# Patient Record
Sex: Male | Born: 1975 | Race: Black or African American | Hispanic: No | Marital: Married | Smoking: Former smoker
Health system: Southern US, Community
[De-identification: ages and names within clinical notes are randomized; demographics above are authoritative.]

## PROBLEM LIST (undated history)

## (undated) DIAGNOSIS — I82409 Acute embolism and thrombosis of unspecified deep veins of unspecified lower extremity: Secondary | ICD-10-CM

## (undated) DIAGNOSIS — G4733 Obstructive sleep apnea (adult) (pediatric): Secondary | ICD-10-CM

## (undated) DIAGNOSIS — I5030 Unspecified diastolic (congestive) heart failure: Secondary | ICD-10-CM

## (undated) DIAGNOSIS — E119 Type 2 diabetes mellitus without complications: Secondary | ICD-10-CM

## (undated) DIAGNOSIS — J449 Chronic obstructive pulmonary disease, unspecified: Secondary | ICD-10-CM

## (undated) DIAGNOSIS — M109 Gout, unspecified: Secondary | ICD-10-CM

## (undated) DIAGNOSIS — I10 Essential (primary) hypertension: Secondary | ICD-10-CM

## (undated) DIAGNOSIS — I509 Heart failure, unspecified: Secondary | ICD-10-CM

## (undated) HISTORY — PX: OTHER SURGICAL HISTORY: SHX169

---

## 2005-05-15 ENCOUNTER — Emergency Department: Payer: Self-pay | Admitting: Emergency Medicine

## 2005-09-20 ENCOUNTER — Emergency Department: Payer: Self-pay | Admitting: Emergency Medicine

## 2005-09-20 ENCOUNTER — Other Ambulatory Visit: Payer: Self-pay

## 2006-09-04 ENCOUNTER — Inpatient Hospital Stay: Payer: Self-pay | Admitting: Internal Medicine

## 2006-09-04 ENCOUNTER — Other Ambulatory Visit: Payer: Self-pay

## 2006-09-05 ENCOUNTER — Other Ambulatory Visit: Payer: Self-pay

## 2006-09-06 ENCOUNTER — Other Ambulatory Visit: Payer: Self-pay

## 2008-08-23 ENCOUNTER — Emergency Department: Payer: Self-pay | Admitting: Emergency Medicine

## 2009-07-21 ENCOUNTER — Emergency Department: Payer: Self-pay | Admitting: Unknown Physician Specialty

## 2009-12-12 ENCOUNTER — Inpatient Hospital Stay: Payer: Self-pay | Admitting: Internal Medicine

## 2010-04-26 ENCOUNTER — Emergency Department: Payer: Self-pay

## 2011-03-04 ENCOUNTER — Inpatient Hospital Stay: Payer: Self-pay | Admitting: Internal Medicine

## 2011-11-13 ENCOUNTER — Inpatient Hospital Stay: Payer: Self-pay | Admitting: Internal Medicine

## 2011-11-13 LAB — BASIC METABOLIC PANEL
Anion Gap: 9 (ref 7–16)
Co2: 30 mmol/L (ref 21–32)
Creatinine: 0.72 mg/dL (ref 0.60–1.30)
EGFR (African American): >60
EGFR (Non-African Amer.): >60
Osmolality: 280 (ref 275–301)

## 2011-11-13 LAB — CBC
HGB: 14.6 g/dL (ref 13.0–18.0)
MCH: 26.7 pg (ref 26.0–34.0)
MCV: 82 fL (ref 80–100)
Platelet: 173 10*3/uL (ref 150–440)
RBC: 5.44 10*6/uL (ref 4.40–5.90)
RDW: 16.9 % — ABNORMAL HIGH (ref 11.5–14.5)
WBC: 6.3 10*3/uL (ref 3.8–10.6)

## 2011-11-14 LAB — CBC WITH DIFFERENTIAL/PLATELET
Basophil #: 0.1 10*3/uL (ref 0.0–0.1)
Eosinophil %: 0 %
HCT: 42.2 % (ref 40.0–52.0)
Lymphocyte %: 17.5 %
Monocyte %: 7.7 %
Neutrophil #: 4.3 10*3/uL (ref 1.4–6.5)
RDW: 17.3 % — ABNORMAL HIGH (ref 11.5–14.5)

## 2011-11-19 LAB — CULTURE, BLOOD (SINGLE)

## 2012-08-02 ENCOUNTER — Emergency Department: Payer: Self-pay | Admitting: Unknown Physician Specialty

## 2012-08-02 LAB — CBC
HCT: 46.1 % (ref 40.0–52.0)
HGB: 15.6 g/dL (ref 13.0–18.0)
MCH: 28 pg (ref 26.0–34.0)
MCHC: 33.9 g/dL (ref 32.0–36.0)
MCV: 83 fL (ref 80–100)
RDW: 18.3 % — ABNORMAL HIGH (ref 11.5–14.5)
WBC: 6.4 10*3/uL (ref 3.8–10.6)

## 2012-08-02 LAB — CK TOTAL AND CKMB (NOT AT ARMC)
CK, Total: 195 U/L (ref 35–232)
CK, Total: 168 U/L (ref 35–232)
CK-MB: 1.3 ng/mL (ref 0.5–3.6)

## 2012-08-02 LAB — COMPREHENSIVE METABOLIC PANEL
Albumin: 3.8 g/dL (ref 3.4–5.0)
Anion Gap: 9 (ref 7–16)
BUN: 14 mg/dL (ref 7–18)
Chloride: 102 mmol/L (ref 98–107)
Creatinine: 0.8 mg/dL (ref 0.60–1.30)
EGFR (African American): >60
Glucose: 123 mg/dL — ABNORMAL HIGH (ref 65–99)
Osmolality: 279 (ref 275–301)
Potassium: 3.9 mmol/L (ref 3.5–5.1)
Sodium: 139 mmol/L (ref 136–145)

## 2012-08-02 LAB — TROPONIN I
Troponin-I: <0.02 ng/mL
Troponin-I: <0.02 ng/mL

## 2012-08-02 LAB — PRO B NATRIURETIC PEPTIDE: B-Type Natriuretic Peptide: 25 pg/mL (ref 0–125)

## 2012-10-04 ENCOUNTER — Emergency Department: Payer: Self-pay | Admitting: Emergency Medicine

## 2012-10-04 LAB — COMPREHENSIVE METABOLIC PANEL
Albumin: 3.6 g/dL (ref 3.4–5.0)
Calcium, Total: 9.6 mg/dL (ref 8.5–10.1)
Chloride: 104 mmol/L (ref 98–107)
EGFR (Non-African Amer.): >60
Glucose: 168 mg/dL — ABNORMAL HIGH (ref 65–99)
Potassium: 3.9 mmol/L (ref 3.5–5.1)
SGOT(AST): 27 U/L (ref 15–37)
SGPT (ALT): 27 U/L (ref 12–78)
Total Protein: 7.5 g/dL (ref 6.4–8.2)

## 2012-10-04 LAB — CBC
HCT: 44.9 % (ref 40.0–52.0)
MCHC: 33.1 g/dL (ref 32.0–36.0)
RDW: 17.2 % — ABNORMAL HIGH (ref 11.5–14.5)
WBC: 6 10*3/uL (ref 3.8–10.6)

## 2012-10-04 LAB — TROPONIN I: Troponin-I: <0.02 ng/mL

## 2012-11-06 LAB — URINALYSIS, COMPLETE
Bilirubin,UR: NEGATIVE
Blood: NEGATIVE
Glucose,UR: NEGATIVE mg/dL (ref 0–75)
Ph: 6 (ref 4.5–8.0)
RBC,UR: 1 /HPF (ref 0–5)
Specific Gravity: 1.016 (ref 1.003–1.030)
WBC UR: 4 /HPF (ref 0–5)

## 2012-11-06 LAB — COMPREHENSIVE METABOLIC PANEL
Albumin: 4 g/dL (ref 3.4–5.0)
Bilirubin,Total: 0.5 mg/dL (ref 0.2–1.0)
Calcium, Total: 9.7 mg/dL (ref 8.5–10.1)
Chloride: 101 mmol/L (ref 98–107)
Co2: 31 mmol/L (ref 21–32)
Creatinine: 1.02 mg/dL (ref 0.60–1.30)
EGFR (African American): >60
EGFR (Non-African Amer.): >60
Glucose: 117 mg/dL — ABNORMAL HIGH (ref 65–99)
Osmolality: 276 (ref 275–301)
SGOT(AST): 28 U/L (ref 15–37)
Sodium: 138 mmol/L (ref 136–145)
Total Protein: 8.3 g/dL — ABNORMAL HIGH (ref 6.4–8.2)

## 2012-11-06 LAB — TROPONIN I: Troponin-I: <0.02 ng/mL

## 2012-11-06 LAB — CBC
HCT: 47.2 % (ref 40.0–52.0)
MCHC: 33.4 g/dL (ref 32.0–36.0)
MCV: 83 fL (ref 80–100)
Platelet: 211 10*3/uL (ref 150–440)
RBC: 5.7 10*6/uL (ref 4.40–5.90)
RDW: 17.6 % — ABNORMAL HIGH (ref 11.5–14.5)

## 2012-11-07 ENCOUNTER — Inpatient Hospital Stay: Payer: Self-pay | Admitting: Internal Medicine

## 2012-11-07 LAB — HEMOGLOBIN A1C: Hemoglobin A1C: 5.5 % (ref 4.2–6.3)

## 2013-05-07 IMAGING — CT CT CHEST W/ CM
2 series · 15 of 31 positions shown, 19 images · IV contrast (APPLIED)
Comparison: none

REASON FOR EXAM: fever, cough, left sided chest pain w/ severely abnormal
cxr chronically.
COMMENTS:

PROCEDURE:     CT  - CT CHEST (FOR PE) W  - November 07, 2012  [DATE]
RESULT:     Comparison: 11/13/2011
TECHNIQUE: Multiple thin section axial images were obtained from the lung
apices to the upper abdomen following 100 ml Isovue 370 intravenous
contrast, according to the PE protocol. These images were also reviewed on a
[employer] multiplanar work station.

[Series 4: soft tissue · axial · 0.79mm/px · z∈[-304,-262]mm · 2 of 95 slices shown]
[im 8/95  mediastinal]
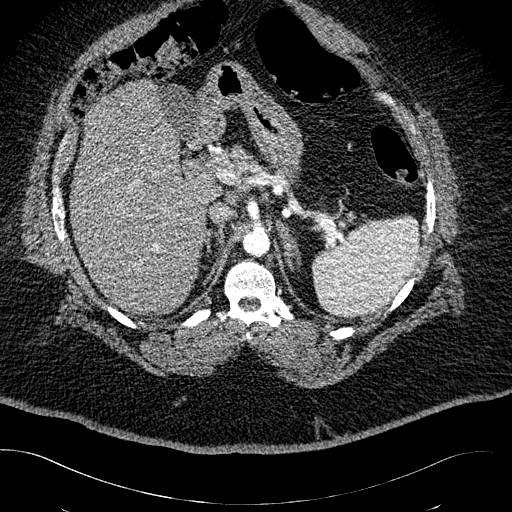
[im 22/95  mediastinal]
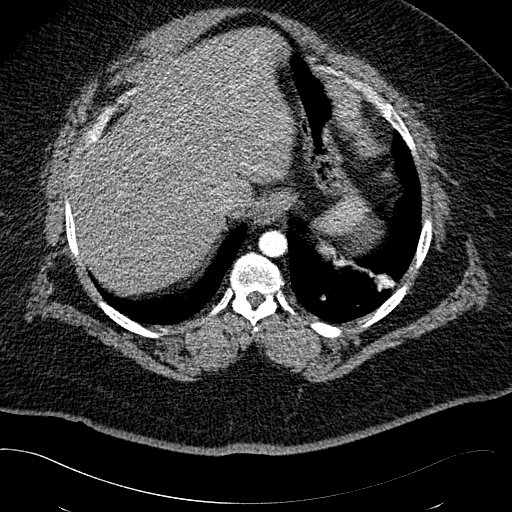

[Series 5: lung windows · axial · 0.79mm/px · z∈[-298,-68]mm · 13 of 93 slices shown, 17 images]
[im 8/93  mediastinal]
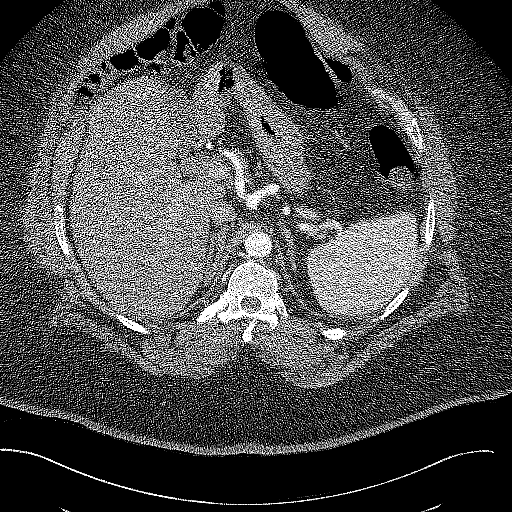
[im 8/93  lung]
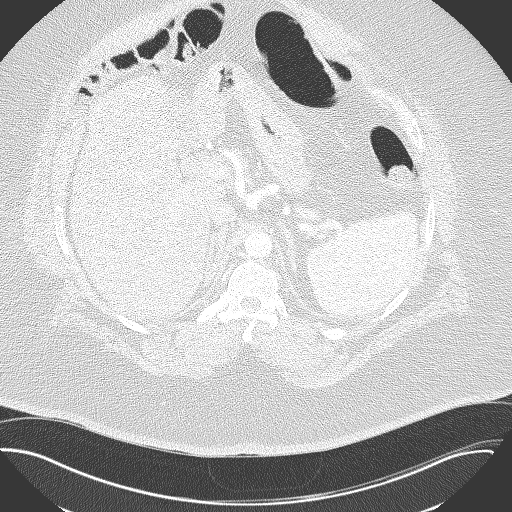
[im 15/93  lung]
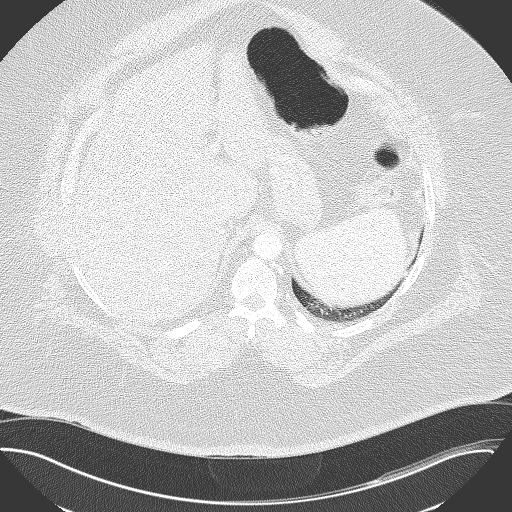
[im 22/93  lung]
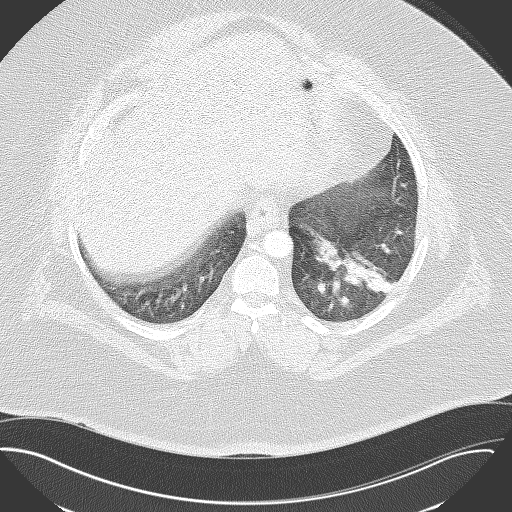
[im 29/93  lung]
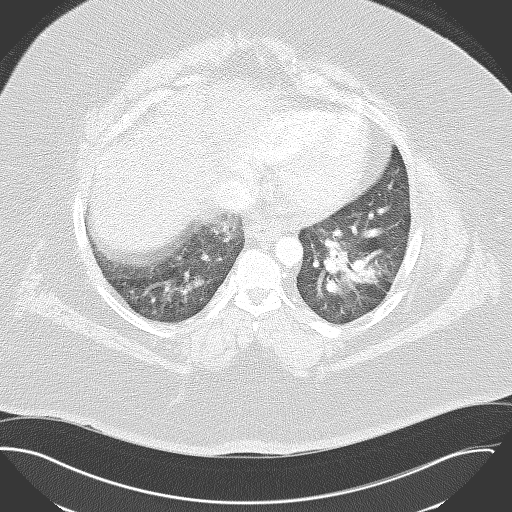
[im 36/93  mediastinal]
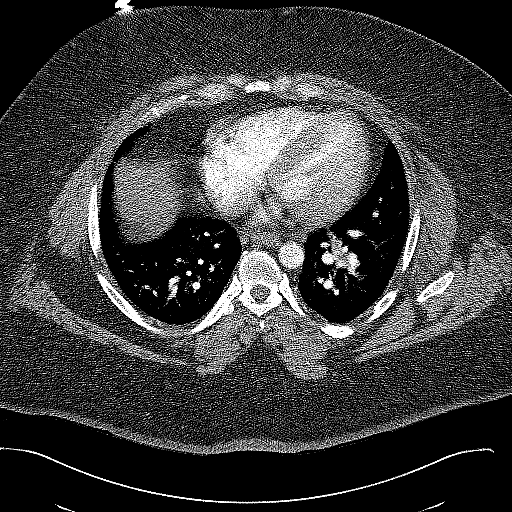
[im 36/93  lung]
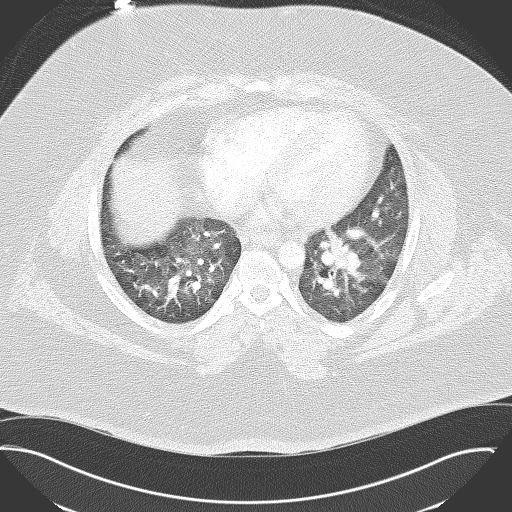
[im 43/93  lung]
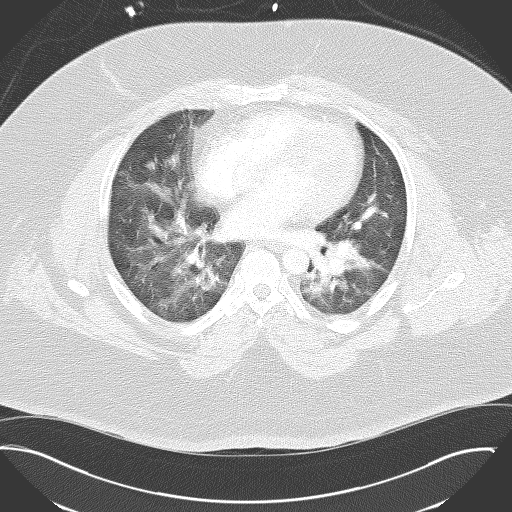
[im 47/93  lung]
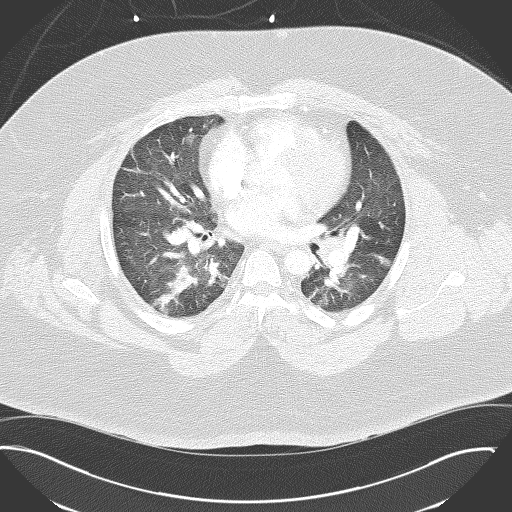
[im 50/93  lung]
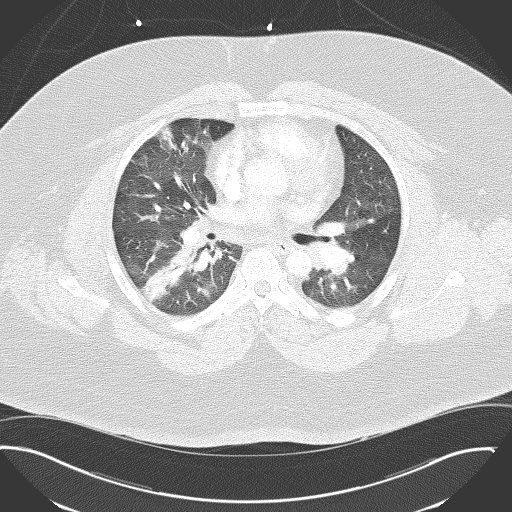
[im 57/93  mediastinal]
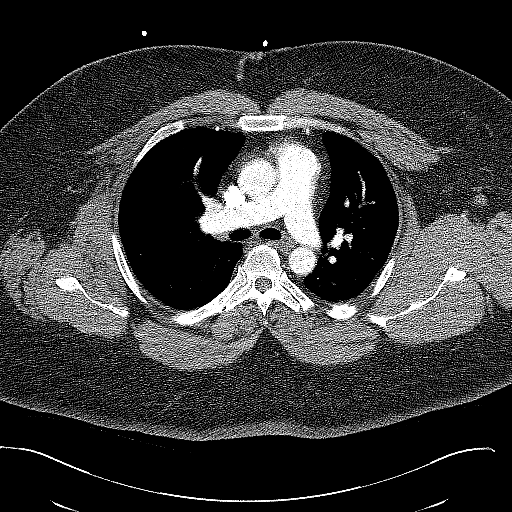
[im 57/93  lung]
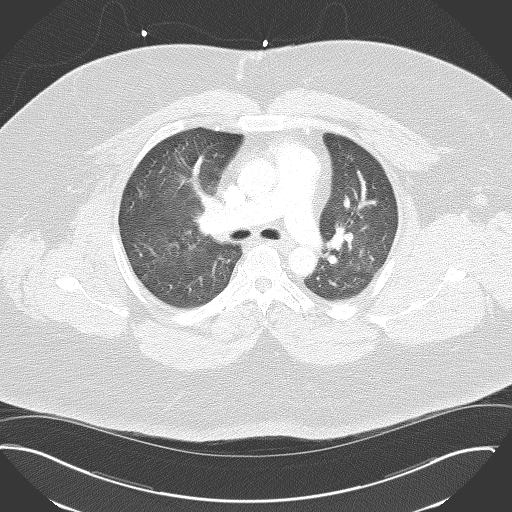
[im 64/93  lung]
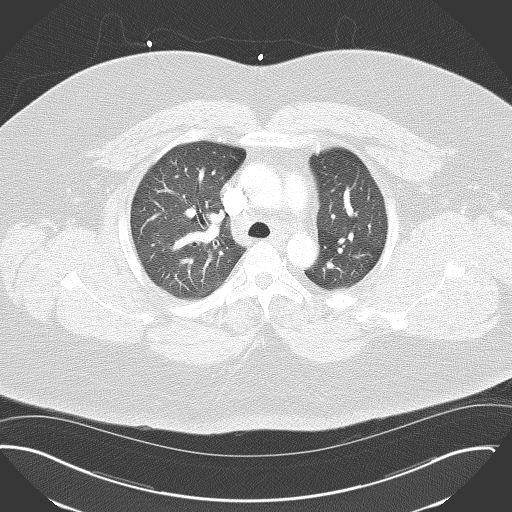
[im 71/93  lung]
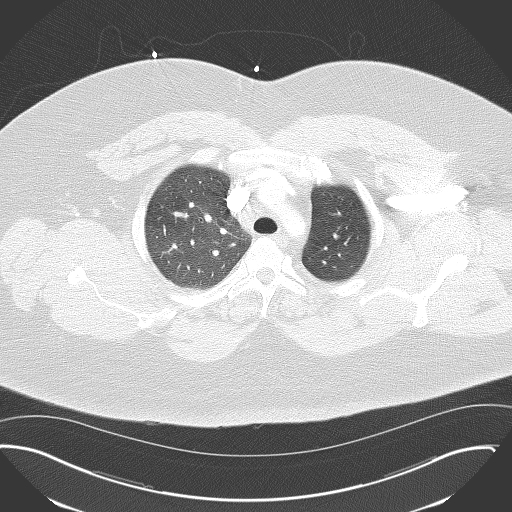
[im 78/93  lung]
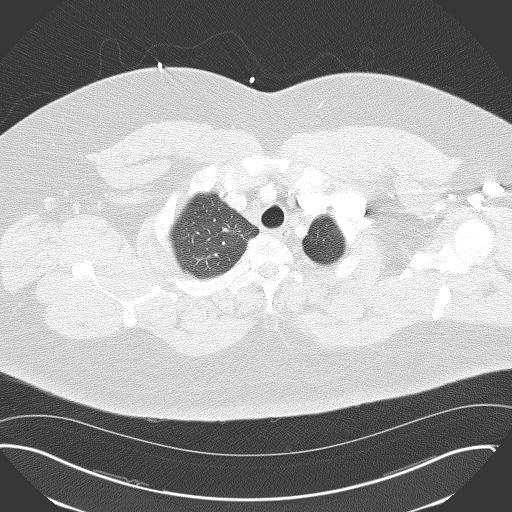
[im 85/93  mediastinal]
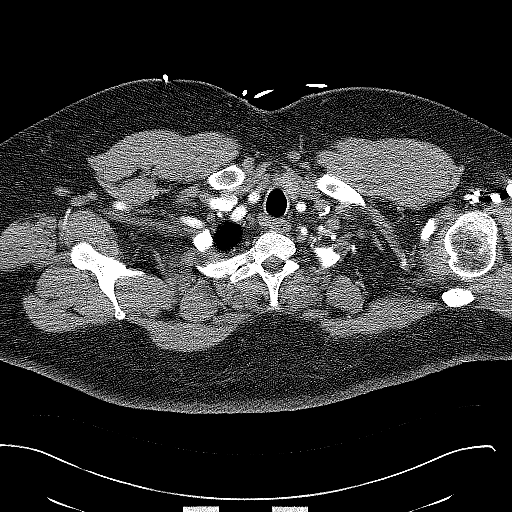
[im 85/93  lung]
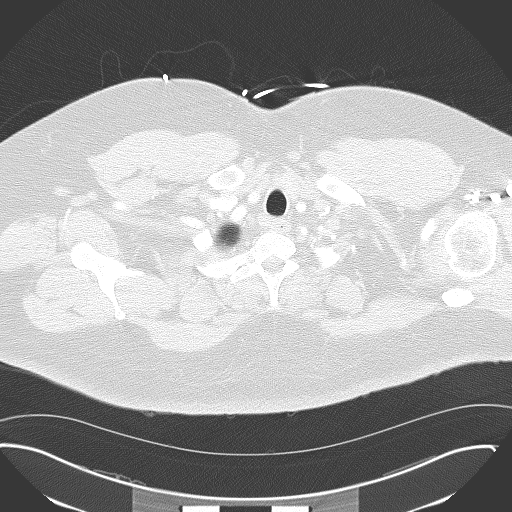

[15 of 31 positions shown; findings below may reference images not displayed]

FINDINGS: No mediastinal, hilar or, or axillary lymphadenopathy.

The thoracic aorta is normal in caliber. Evaluation of the segmental
pulmonary arteries is limited by respiratory motion as well is quantum
mottle artifact secondary to patient body habitus. No pulmonary embolus
identified.

There are bilateral groundglass opacities. There are mild patchy areas of
consolidation in the lower lobes. Overall, these findings are relatively
similar to the prior chest CT.

No aggressive lytic or sclerotic osseous lesions are identified.
IMPRESSION: 1. No pulmonary embolus identified. Evaluation of the segmental pulmonary
arteries is limited.
2. Diffuse pulmonary opacities which are well relatively similar to prior.
These may be infectious or inflammatory, including nonspecific types of
interstitial lung disease. Pulmonary consultation is suggested.

## 2013-05-25 ENCOUNTER — Inpatient Hospital Stay: Payer: Self-pay | Admitting: Internal Medicine

## 2013-05-25 LAB — CBC
HGB: 14.8 g/dL (ref 13.0–18.0)
MCH: 26.8 pg (ref 26.0–34.0)
MCHC: 33.3 g/dL (ref 32.0–36.0)
MCV: 80 fL (ref 80–100)
Platelet: 213 10*3/uL (ref 150–440)
WBC: 7.1 10*3/uL (ref 3.8–10.6)

## 2013-05-25 LAB — COMPREHENSIVE METABOLIC PANEL
Albumin: 3.4 g/dL (ref 3.4–5.0)
BUN: 12 mg/dL (ref 7–18)
Bilirubin,Total: 0.6 mg/dL (ref 0.2–1.0)
Calcium, Total: 9.9 mg/dL (ref 8.5–10.1)
Chloride: 103 mmol/L (ref 98–107)
Co2: 30 mmol/L (ref 21–32)
Creatinine: 0.82 mg/dL (ref 0.60–1.30)
EGFR (African American): >60
Glucose: 132 mg/dL — ABNORMAL HIGH (ref 65–99)
SGOT(AST): 32 U/L (ref 15–37)
SGPT (ALT): 48 U/L (ref 12–78)
Sodium: 138 mmol/L (ref 136–145)
Total Protein: 7.5 g/dL (ref 6.4–8.2)

## 2013-05-25 LAB — TROPONIN I: Troponin-I: <0.02 ng/mL

## 2013-05-25 LAB — PRO B NATRIURETIC PEPTIDE: B-Type Natriuretic Peptide: 70 pg/mL (ref 0–125)

## 2013-05-26 LAB — BASIC METABOLIC PANEL
Anion Gap: 6 — ABNORMAL LOW (ref 7–16)
Chloride: 102 mmol/L (ref 98–107)
Co2: 29 mmol/L (ref 21–32)
Creatinine: 0.84 mg/dL (ref 0.60–1.30)
EGFR (African American): >60
EGFR (Non-African Amer.): >60
Glucose: 202 mg/dL — ABNORMAL HIGH (ref 65–99)
Osmolality: 279 (ref 275–301)
Potassium: 4.3 mmol/L (ref 3.5–5.1)
Sodium: 137 mmol/L (ref 136–145)

## 2013-05-26 LAB — CBC WITH DIFFERENTIAL/PLATELET
Basophil %: 0.5 %
Eosinophil %: 0 %
Lymphocyte #: 0.6 10*3/uL — ABNORMAL LOW (ref 1.0–3.6)
Lymphocyte %: 7.7 %
MCHC: 33.4 g/dL (ref 32.0–36.0)
MCV: 80 fL (ref 80–100)
Neutrophil %: 90.4 %
Platelet: 219 10*3/uL (ref 150–440)

## 2013-05-26 LAB — LIPID PANEL
Cholesterol: 149 mg/dL (ref 0–200)
Triglycerides: 53 mg/dL (ref 0–200)
VLDL Cholesterol, Calc: 11 mg/dL (ref 5–40)

## 2013-05-26 LAB — TSH: Thyroid Stimulating Horm: 0.866 u[IU]/mL

## 2013-05-31 LAB — CULTURE, BLOOD (SINGLE)

## 2013-09-11 ENCOUNTER — Emergency Department: Payer: Self-pay | Admitting: Emergency Medicine

## 2013-09-11 LAB — BASIC METABOLIC PANEL
Calcium, Total: 9.8 mg/dL (ref 8.5–10.1)
Chloride: 103 mmol/L (ref 98–107)
Co2: 29 mmol/L (ref 21–32)
Creatinine: 0.85 mg/dL (ref 0.60–1.30)
EGFR (African American): >60
EGFR (Non-African Amer.): >60
Osmolality: 278 (ref 275–301)

## 2013-09-11 LAB — PRO B NATRIURETIC PEPTIDE: B-Type Natriuretic Peptide: 54 pg/mL (ref 0–125)

## 2013-09-11 LAB — CBC
HGB: 14.7 g/dL (ref 13.0–18.0)
MCH: 26.9 pg (ref 26.0–34.0)
MCHC: 32.7 g/dL (ref 32.0–36.0)
Platelet: 207 10*3/uL (ref 150–440)
RDW: 17.7 % — ABNORMAL HIGH (ref 11.5–14.5)

## 2014-07-28 ENCOUNTER — Emergency Department: Payer: Self-pay | Admitting: Emergency Medicine

## 2014-07-28 LAB — CBC
HCT: 46.2 % (ref 40.0–52.0)
HGB: 14.8 g/dL (ref 13.0–18.0)
MCH: 25 pg — ABNORMAL LOW (ref 26.0–34.0)
MCHC: 32 g/dL (ref 32.0–36.0)
MCV: 78 fL — AB (ref 80–100)
Platelet: 243 10*3/uL (ref 150–440)
RBC: 5.91 10*6/uL — AB (ref 4.40–5.90)
RDW: 19.9 % — ABNORMAL HIGH (ref 11.5–14.5)
WBC: 6.7 10*3/uL (ref 3.8–10.6)

## 2014-07-28 LAB — BASIC METABOLIC PANEL
Anion Gap: 7 (ref 7–16)
BUN: 17 mg/dL (ref 7–18)
CO2: 33 mmol/L — AB (ref 21–32)
CREATININE: 1.03 mg/dL (ref 0.60–1.30)
Calcium, Total: 9.6 mg/dL (ref 8.5–10.1)
Chloride: 102 mmol/L (ref 98–107)
EGFR (African American): >60
EGFR (Non-African Amer.): >60
GLUCOSE: 159 mg/dL — AB (ref 65–99)
Osmolality: 288 (ref 275–301)
Potassium: 4.1 mmol/L (ref 3.5–5.1)
SODIUM: 142 mmol/L (ref 136–145)

## 2014-07-28 LAB — TROPONIN I: Troponin-I: <0.02 ng/mL

## 2014-07-28 LAB — PRO B NATRIURETIC PEPTIDE: B-Type Natriuretic Peptide: 2876 pg/mL — ABNORMAL HIGH (ref 0–125)

## 2014-07-29 LAB — URINALYSIS, COMPLETE
BACTERIA: NONE SEEN
BLOOD: NEGATIVE
Bilirubin,UR: NEGATIVE
GLUCOSE, UR: NEGATIVE mg/dL (ref 0–75)
KETONE: NEGATIVE
Leukocyte Esterase: NEGATIVE
NITRITE: NEGATIVE
Ph: 5 (ref 4.5–8.0)
RBC,UR: 5 /HPF (ref 0–5)
SPECIFIC GRAVITY: 1.024 (ref 1.003–1.030)
Squamous Epithelial: NONE SEEN
WBC UR: 1 /HPF (ref 0–5)

## 2014-08-02 LAB — CULTURE, BLOOD (SINGLE)

## 2014-08-03 LAB — CULTURE, BLOOD (SINGLE)

## 2015-02-09 NOTE — Discharge Summary (Signed)
PATIENT NAME:  Charles Rangel, Charles MR#:  [id number] DATE OF BIRTH:  Apr 06, 1976  DATE OF ADMISSION:  11/07/2012 DATE OF DISCHARGE:  11/09/2012  ADMITTING PHYSICIAN: Aurther LoftAdaorah E. Okafor, DO  DISCHARGING PHYSICIAN:  Enid Baasadhika Yannick Steuber, MD  [facility]: [facility] [place].   CONSULTATIONS: None.     DISCHARGE DIAGNOSES: 1.  Acute hypoxic respiratory failure.  2.  Chronic obstructive pulmonary disease exacerbation.  3.  Pneumonia.  4.  Hypertension.  5.  Diabetes mellitus.   DISCHARGE HOME MEDICATIONS:  1.  Norvasc 10 mg p.o. daily.  2.  Ibuprofen 400 mg every 6 hours p.r.n.  3.  Metformin 500 mg p.o. daily.  4.  Prilosec 20 mg p.o. t.i.d.  5.  Potassium chloride 20 mEq p.o. daily.  6.  Lasix 20 mg p.o. daily.  7.  Metoprolol 25 mg p.o. b.i.d.  8.  Spiriva 18 mcg inhalation daily.  9.  Advair 250/50 one puff b.i.d.  10.  Prednisone taper.  11.  Levaquin 500 mg p.o. daily for 5 more days.   DISCHARGE HOME OXYGEN: None.   DISCHARGE DIET: Low sodium, ADA diet.   DISCHARGE ACTIVITY: As tolerated.  FOLLOWUP INSTRUCTIONS: 1.  [facility].  2.  [facility]   LABS AND IMAGING STUDIES:  HbA1c is 5.5.   CT of the chest showing no PE, diffuse pulmonary opacity, similar to prior, infectious inflammatory or interstitial lung disease. Influenza antigens are negative.   Chest x-ray on admission showing nonspecific interstitial opacities.   WBC is 5.4, hemoglobin 15.8, hematocrit 47.2, platelet count 211.   Sodium 138, potassium 4.2, chloride 101, bicarbonate 31, BUN 11, creatinine 1.02, glucose 117, calcium 9.7.   ALT 41, AST 28,  alkaline phosphatase 142. Troponin negative. Urinalysis is negative for any infection.  BRIEF HOSPITAL COURSE: The patient is an obese 39 year old African American male with past medical history significant for OSA on CPAP, COPD, presents to the hospital secondary to worsening cough,  difficulty breathing and was found to be hypoxic on room air.  1. Acute hypoxic respiratory failure. Chest x-ray with interstitial opacities.  He was treated for possible community-acquired pneumonia.  He already was on Rocephin and azithromycin while in the hospital and oxygen support and also IV steroids for COPD exacerbation. The steroids were tapered to prednisone, antibiotics changed to Levaquin at the time of discharge. He was not taking any inhalers at home for his COPD, so he is being started on Advair, Spiriva and albuterol  p.r.n. He also states that his CPAP machine was broken at home, so nocturnal oximetry was done and ambulatory, resting sats had improved by the time of discharge. But he is supposed to follow up for a repeat sleep study, so that [facility], which he was strongly recommended to follow up in the next 1 to 2 weeks.  3.  Other home medications for his diabetes, hypertension were continued without any changes. His course has been otherwise uneventful in the hospital.   DISCHARGE CONDITION: Stable.   DISCHARGE DISPOSITION: Home.   TIME SPENT ON DISCHARGE: 45 minutes.     ____________________________ Enid Baasadhika Corderius Saraceni, MD rk:cc D: 11/09/2012 15:22:02 ET T: 11/09/2012 18:44:07 ET JOB#: [id number]  cc: Enid Baasadhika Butch Otterson, MD, <Dictator> [facility]  Markiyah Gahm MD ELECTRONICALLY SIGNED 11/22/2012 21:20

## 2015-02-09 NOTE — Discharge Summary (Signed)
PATIENT NAME:  Charles Rangel, Charles Rangel MR#:  [id number] DATE OF BIRTH:  [birth year]  DATE OF ADMISSION:  05/25/2013 DATE OF DISCHARGE:  05/28/2013  [facility]   DISCHARGE DIAGNOSES:  1.  Chronic obstructive pulmonary disease exacerbation.  2.  Hypertension.  3.  A history of chronic obstructive pulmonary disease.  4.  A history of sleep apnea, with possible morbid obesity hypoventilation syndrome.  5.  Diabetes mellitus type 2.   DISCHARGE MEDICATIONS:  Advair Diskus 250/50 one puff b.i.d., amlodipine 10 mg daily, Lasix 20 mg p.o. daily, hydrochlorothiazide 20 mg p.o. daily, metformin 500 mg 1 tablet p.o. b.i.d., omeprazole 20 mg p.o. b.i.d., Levaquin 750 mg daily for 5 days, prednisone dose taper from 60 mg to 10 mg until complete.  New medicine is metoprolol 50 mg p.o. every 12 hours.   DIET:  Low sodium diet.   ACTIVITY:  As tolerated.   FO[facility]UP[facility]  [facility] one [facility] to 10 days.   HOSPITAL COURSE:  A 39 year old male patient admitted on August 6th because of shortness of breath. The patient also had some wheezing and yellow sputum. When he came he had bilateral wheezing in lungs, O2 sats 92% on 2 liters. Blood pressure elevated at 182/102.  1.  Admitted for COPD flare with acute on chronic COPD flare, started on IV Levaquin along with Solu-Medrol and nebulizers. A chest x-ray did not show any pneumonia so he started to get much better in terms of his wheezing and shortness of breath and went off from Solu-Medrol to prednisone, changed the Levaquin to p.o. The patient will be discharged home with prednisone, Levaquin and nebulizer.  2.  Regarding tachycardia and hypertension, the patient became tachycardic on ambulation, started on metoprolol and heart rate went up to 137 on ambulation but with metoprolol the heart rate has been around 80s and he feels so he can continue the metoprolol.   3.  Sleep apnea. He needs to use the CPAP. Encouraged him to continue that. 4.   Hypertension. The patient is on amlodipine and hydrochlorothiazide, metoprolol.  5.  Diabetes mellitus type 2. Continue metformin 500 mg p.o. b.i.d. and blood work. The patient's chest x-ray on admission showed bilateral infiltrates, which are chronic.  6.  Blood cultures have been negative.   ELECTROLYTES:  Sodium 138, potassium 4, chloride 103, bicarb 30, BUN 12, creatinine 0.82, glucose 132. August 6th, the patient's LDL is 87.  The patient's WBC remained around 7.3, hemoglobin 15.4, hematocrit 46, platelets 219.   The patient is stable for discharge.   TIME SPENT: More than 30 minutes.   ____________________________ Katha HammingSnehalatha Aalyiah Camberos, MD sk:jm D: 05/28/2013 10:26:28 ET T: 05/28/2013 10:59:56 ET JOB#: [id number]  cc: Katha HammingSnehalatha Ashlyne Olenick, MD, <Dictator> [facility] Katha HammingSNEHALATHA Carsyn Taubman MD ELECTRONICALLY SIGNED 06/07/2013 17:21

## 2015-02-09 NOTE — H&P (Signed)
PATIENT NAME:  Charles Rangel, Charles Rangel MR#:  [id number] DATE OF BIRTH:  1976-06-25  DATE OF ADMISSION:  05/25/2013  [facility]  REFERRING EMERGENCY DEPARTMENT PHYSICIAN: Humberto Leep. Margarita Grizzle, MD   CHIEF COMPLAINT: Shortness of breath.   HISTORY OF PRESENT ILLNESS: The patient is a 39 year old African-American male with history of morbid obesity, obstructive sleep apnea, who is noncompliant with his CPAP machine, has history of COPD, diabetes, hypertension, history of sinusitis, who reports that since last Sunday, he developed significant amount of congestion and symptoms consistent with his sinusitis. He took some allergy medications without any significant relief in his symptoms. The patient's symptoms continued to get worse. He also started getting short of breath and started having wheezing, and he started having productive coughing of yellow sputum. Therefore, he came to the ED. The patient is very short of breath in the ED, and we are asked to admit the patient. He denies any fevers or chills. Complains of occasional left-sided chest pain. Does complain of intermittent lower extremity swelling. He does have sleep apnea and states that he has to go back to his regular doctor to get the CPAP machine, but in the past, he is stated to be noncompliant with the CPAP machine.   PAST MEDICAL HISTORY:  1. Hypertension.  2. History of sinusitis.  3. COPD.  4. Diabetes.  5. History of obstructive sleep apnea, with likely obesity hypoventilation syndrome.   ALLERGIES: TYLENOL, RAISINS, CAUSE FACIAL SWELLING AS WELL AS APPLE.   CURRENT MEDICATIONS:  1. Advair 250/50 one puff b.i.d. 2. Amlodipine 10 daily. 3. Lasix 20 daily.  4. Hydrochlorothiazide 25 p.o. daily.  5. Metformin 500 mg 1 tab p.o. b.i.d. 6. Omeprazole 20 mg 1 tab p.o. b.i.d.   SOCIAL HISTORY: He states that he was smoking, quit in January. Used to drink 2 to 3 drinks every other day, but he reports that now he is only  drinking once in a while. Denies any illicit drug use.   FAMILY HISTORY: Notable for hypertension. Father had lung cancer.   REVIEW OF SYSTEMS:  CONSTITUTIONAL: Denies any fevers. Complains of fatigue, weakness. No pain. No weight loss. No weight gain.  EYES: No blurred or double vision. No pain. No redness. No inflammation. No glaucoma. No cataracts.  ENT: No tinnitus. No ear pain. No hearing loss. Has year-round allergies. No epistaxis. Complains of nasal discharge. Complains of snoring. Complains of sinus pain, postnasal drip. No dentures. No difficulty swallowing.  RESPIRATORY: Complains of cough, wheezing. No hemoptysis. Has COPD.  CARDIOVASCULAR: Denies any chest pain, orthopnea. Does complain of edema. No arrhythmia. No syncope.  GASTROINTESTINAL: No nausea, vomiting, diarrhea. No abdominal pain. No hematemesis. No melena. No ulcers. No GERD. No IBS. No jaundice. No rectal bleeding.  GENITOURINARY: Denies any dysuria, hematuria, renal calculus or frequency.  ENDOCRINE: Denies any polyuria, nocturia or thyroid problems.  HEMATOLOGIC AND LYMPHATIC: Denies anemia, easy bruisability or bleeding.  SKIN: No acne. No rash. No changes in mole, hair or skin.  MUSCULOSKELETAL: Denies any pain in the neck, back or shoulder.  NEUROLOGIC: No numbness. No CVA. No TIA. No seizures.  PSYCHIATRIC: No anxiety. No insomnia. No ADD. No OCD.   PHYSICAL EXAMINATION:   VITAL SIGNS: Temperature 99, pulse 105, respirations 18, blood pressure 182/102, O2 96% on 2 liters.  GENERAL: The patient is a morbidly obese African-American male. Currently, not in any acute distress.  HEENT: Head atraumatic, normocephalic. Pupils equally round and reactive to light and accommodation. There  is no conjunctival pallor. No scleral icterus. Extraocular movements intact. Nasal exam shows nasal bogginess and some clear drainage. External ear exam shows no drainage or any lesions. Mouth has no exudates, is moist.  NECK: Supple and  symmetric. No masses. Thyroid midline, not enlarged.  RESPIRATORY: Good respiratory effort. Diminished breath sounds with bilateral wheezing. There are no rhonchi or crackles.  CARDIOVASCULAR: Regular rate and rhythm. No murmurs, gallops, clicks, heaves or rubs. PMI is not displaced.  ABDOMEN: Soft, nontender, nondistended. Positive bowel sounds x4. There is no hepatosplenomegaly.  GENITOURINARY: Deferred.  MUSCULOSKELETAL: There is no erythema or swelling.  VASCULAR: Good DP, PT pulses.  PSYCHIATRIC: Not anxious or depressed.  SKIN: There is dry skin. No other abnormalities noted.  LYMPHATICS: No lymph nodes palpable.  NEUROLOGICAL: Cranial nerves II through XII grossly intact. Strength 5 out of 5 in all 4 extremities.  PSYCHIATRIC: Not anxious or depressed.   EVALUATIONS: His BNP is 70. WBC 7.1, hemoglobin 14.8, platelet count 213, glucose 132, BUN 12, creatinine 0.82, sodium 138, potassium is 4.0, chloride 103, CO2 is 30. His LFTs are normal. His troponin is less than 0.02. Chest x-ray shows bilateral interstitial thickening, could be related to interstitial pneumonitis, secondary infection or inflammatory processes. To note, his chest x-ray was reviewed from January, and it actually looked worse at that time compared to today.   ASSESSMENT AND PLAN: The patient is a 39 year old African American male with history of chronic obstructive pulmonary disease, morbid obesity, sleep apnea, not using CPAP, who presents with shortness of breath, cough, congestion and upper respiratory symptoms.   1. Acute on chronic obstructive pulmonary disease flare. At this time, will treat him with nebulizers, IV Solu-Medrol and treat him with IV Levaquin. Chest x-ray does show bilateral infiltrates, but these are chronic, and he likely has chronic interstitial lung disease. His BNP currently is normal.  2. Acute on chronic respiratory failure. Will treat him with nebulizers, antibiotics, Solu-Medrol. 3. Sleep  apnea, noncompliant with CPAP. I encouraged him to use his CPAP.  4. Hypertension, accelerated. Will add p.r.n. hydralazine. Will continue his home regimen, which includes amlodipine and HCTZ.  5. Miscellaneous. Will place him on Lovenox for deep vein thrombosis prophylaxis.   TIME SPENT: Note, 50 minutes spent on this H and P.    ____________________________ Lacie ScottsShreyang H. Allena KatzPatel, MD shp:OSi D: 05/25/2013 13:37:44 ET T: 05/25/2013 14:04:47 ET JOB#: [id number]  cc: Lenore Moyano H. Allena KatzPatel, MD, <Dictator> Charise CarwinSHREYANG H Lisamarie Coke MD ELECTRONICALLY SIGNED 05/30/2013 10:29

## 2015-02-09 NOTE — H&P (Signed)
DATE OF BIRTH:  13-Dec-1975  PRIMARY CARE PHYSICIAN:  [employer].   CODE STATUS:  FULL CODE.   SURROGATE DECISION MAKER:  His wife, Axel Frisk.   CHIEF COMPLAINT:  Cough.  HISTORY OF PRESENT ILLNESS:  A 39 year old male with history of morbid obesity, weighing 338 pounds currently, obstructive sleep apnea on CPAP, however, noncompliant, presents with cough and shortness of breath that developed today. The patient was last seen by his PCP 3 days prior to presentation. At that point, he was  just complaining of a sinus infection. He was not started on any new medications. On the day of presentation, he started developing shortness of breath, cough and some chest pain when he takes a deep breath, as well as some nausea and vomiting. So, he decided to present to the ED. His cough was productive of thick sputum that was white in color. He notes that he has had some chills as well. He has some associated generalized weakness and mild headache. Regarding his chronic medical conditions, he has diabetes, does not check his glucose at home. He smokes. He is not compliant with his CPAP at this point, stating that it is not working. However, there are "plans" to have him repeat his sleep study. He is usually not oxygen dependent via nasal cannula at least.   Upon evaluation in the Emergency Department, laboratory data was normal. However, there was concern for a right middle lobe atelectasis versus mild infiltrate. So, he has received ceftriaxone and Zithromax for possible community-acquired pneumonia. He was noted to be hypoxic while sleeping to 87%. So, he was started on nasal cannula. However, when he was awake, he was satting 94% on room air. Due to the hypoxia, we were called for admission.   PAST MEDICAL HISTORY:  Notable for:  1.  Hypertension.  2.  Sinusitis.  3.  COPD.  4.  Diabetes mellitus.  5.  OSA with possible obesity hypoventilation syndrome.   ALLERGIES:  TYLENOL, WHICH CAUSES FACIAL  SWELLING.   MEDICATIONS:  1.  Amlodipine 10 mg daily.  2.  Furosemide daily.  3.  Omeprazole 10 mg 3 times a day.  4.  Potassium chloride daily.   FAMILY HISTORY:  Notable for hypertension. His father also has lung cancer. He denies family history of myocardial infarction or CVA.   SOCIAL HISTORY:  Admits to smoking, has smoked for many years, currently smoking 5 cigarettes per day. He notes occasional alcohol, uses about 2 to 3 drinks every other day. Denies any illicits. He sells tobacco.   REVIEW OF SYSTEMS:  CONSTITUTIONAL:  Admits to chills.  EYES:  No eye pain. No blurred vision.  ENT:  No tinnitus. No ear pain. Does admit to sinusitis.  RESPIRATORY:  Admits to productive cough, painful respirations, has history of COPD.  CARDIOVASCULAR:  No chest pain. No swelling in his legs. No palpitations.  GASTROINTESTINAL:  Admits to some nausea, vomiting. Denies any abdominal pain.  GENITOURINARY:  Denies frequency, urgency or dysuria.  ENDOCRINE:  Has history of diabetes. No excessive bleeding or easy bruising.  INTEGUMENTARY:  No skin rashes.  MUSCULOSKELETAL:  Denies arthritis, joint swelling or muscle pains.  NEUROLOGICAL:  Denies numbness, tingling. Does admit to some mild headaches over the last few days. No dysarthria.  PSYCHIATRIC:  No depression or anxiety.   PHYSICAL EXAMINATION:  VITAL SIGNS:  Temp 98.9, heart rate 104 to 112, respirations 20, blood pressure 116/79, at the time satting 91%.  GENERAL:  Morbidly obese African  American male in no apparent distress, resting comfortably.  EYES:  Anicteric sclerae. Pupils equal, round and reactive to light and accommodation.  ENT:  Normal external ears and nares. Posterior oropharynx is clear. There is posterior oropharyngeal crowding.  CARDIOVASCULAR:  S1, S2, tachycardic. No pretibial edema.  RESPIRATORY:  Distant breath sounds due to body habitus.  ABDOMEN:  Obese, nontender, nondistended. I am unable to appreciate organomegaly.   SKIN:  Warm and dry. No rash.  EXTREMITIES:  Full range of motion of all extremities. No clubbing or cyanosis appreciated.    LABORATORY  AND IMAGING DATA:   1.  White count 5.4, hemoglobin 15.8, hematocrit 47.2, platelets 211 and MCV of 83.  2.  Glucose 117, BUN of 11, creatinine 1.02, sodium 138, potassium 4.2, chloride 101, bicarb 31, calcium 9.7. Bilirubin 0.5, alk phos 143, ALT 41, AST 28, total protein 8.3, albumin 4, anion gap 6. Troponin less than 0.02. Urinalysis shows 30 protein, otherwise negative for nitrites and leukocyte esterase.  3.  Influenza A and B antigens negative.  4.  CT angiogram of the chest shows no evidence of pulmonary embolism. Mild areas of ground-glass attenuation. There is some linear basilar density, most compatible with atelectasis. Small inflammatory infiltrates cannot be excluded.  5.  EKG shows sinus tachycardia at 121 beats per minute.  6.  Chest x-ray. Final report pending, however, looks similar to prior x-ray. Evidence of chronic fibrosis.   ASSESSMENT AND PLAN:  This is a 39 year old African American male with chronic obstructive pulmonary disease, obstructive sleep apnea, possible obesity hypoventilation syndrome presenting with some cough, shortness of breath, chills, found to have some minimal infiltrates on CT, however, hypoxic with sleep, concerning for community-acquired pneumonia with subsequent hypoxia.  1.  Community-acquired pneumonia. He has received Rocephin and Zithromax. We will continue this at this time. Continue supplemental oxygen.  2.  Acute hypoxic respiratory failure. Given this patient's multiple pulmonary issues, it stands to reason that the hypoxia while he is sleeping might be as a result of obstructive sleep apnea/obesity hypoventilation syndrome. However, it might be exacerbated by this pneumonia. We will initiate nocturnal oxygen and consult case management to facilitate home oxygen until the patient can have continuous positive  airway pressure established. We do advise that he have an outpatient sleep study and continuous positive airway pressure machine replacement for ongoing therapy.  3.  Diabetes mellitus. He is not on any medications at this point. We recommend further outpatient management. We will check a hemoglobin A1c.  4.  Tobacco abuse. Tobacco cessation counseling was provided. The patient is precontemplative at this point.  5.  Morbid obesity. Lifestyle changes were discussed, and the patient verbalized understanding. He has also been advised to follow up with his primary care physician as soon as possible from discharge from the hospital.  6.  Hypertension. Continue amlodipine.   DISPOSITION:  The patient is being admitted inpatient for community-acquired pneumonia with acute hypoxic respiratory failure. I anticipate he will require 2 hospital midnight stays for management.  TIME SPENT COORDINATING ADMISSION:  45 minutes.    ____________________________ Samson Frederic, DO aeo:ms D: 11/07/2012 03:54:27 ET T: 11/07/2012 20:10:39 ET JOB#: [id number]  cc: Samson Frederic, DO, <Dictator> Vauda Salvucci E Hamdi Kley DO ELECTRONICALLY SIGNED 12/14/2012 3:41

## 2015-02-11 NOTE — H&P (Signed)
PATIENT NAME:  Charles Rangel, Charles Rangel DATE OF BIRTH:  11/18/1975  DATE OF ADMISSION:  11/13/2011  PRIMARY CARE PHYSICIAN: None local.   REFERRING PHYSICIAN: Dr. Sharma CovertNorman   CHIEF COMPLAINT: Shortness of breath, cough with sputum for one week.  HISTORY OF PRESENT ILLNESS: This is a 39 year old African American male with a history of hypertension, pneumonia, sinusitis, COPD, obesity, and obstructive sleep apnea who presented to the ED with shortness of breath and cough with sputum for one week. The patient is alert, awake, oriented in no acute distress. The patient stated that he started to have a postnasal drip a week ago and then developed shortness of breath, cough, wheezing, and yellowish sputum. In addition, the patient feels warm, has wheezing, and also the patient has a headache and dyspnea on exertion. The patient says that he uses a CPAP at home for OSA. The patient's chest x-ray and CT show opacity.   PAST MEDICAL HISTORY:  1. Hypertension. 2. Pneumonia. 3. Sinusitis. 4. Chronic obstructive pulmonary disease  ALLERGIES: Tylenol.  REVIEW OF SYSTEMS: CONSTITUTIONAL: The patient denies any fever or chills but has a headache. No dizziness. No weakness. CARDIOVASCULAR: No chest pain, palpitation, or orthopnea. No nocturnal dyspnea or edema. PULMONARY: Positive for cough, sputum, shortness of breath. No hemoptysis. GI: No abdominal pain, nausea, vomiting, or diarrhea. No melena or bloody stool. GU: No dysuria or hematuria. No urinary frequency or incontinence. MUSCULOSKELETAL: No joint pain. No edema. SKIN: No rash or jaundice. ENDOCRINE: No heat or cold intolerance, polyuria, or polydipsia. PSYCH: No anxiety or depression. NEUROLOGY: No syncope, loss of consciousness, or seizure. ENT: The patient denies any discharge from nose or ear but has a postnasal drip. EYES: No blurred vision or double vision. No glaucoma or redness.   PHYSICAL EXAMINATION:   VITALS: Temperature 98, blood  pressure 123/73, respirations 16, oxygen saturation 95%. Pain scale 0.   GENERAL: The patient is alert, awake, oriented in no acute distress.   HEENT: Pupils are round, equal, reactive to light and accommodation. Moist oral mucosa. Clear oropharynx.   NECK: Supple. No JVD or carotid bruit. No lymphadenopathy. No thyromegaly.   PULMONARY: Bilateral entry. No wheezing or rales.   (Dictation Cut Off Here)     ____________________________ Shaune PollackQing Chenise Mulvihill, MD qc:drc D: 11/13/2011 20:15:50 ET T: 11/14/2011 05:59:23 ET JOB#: [id number]  cc: Shaune PollackQing Jadden Yim, MD, <Dictator> Shaune PollackQING Tyhir Schwan MD ELECTRONICALLY SIGNED 11/14/2011 18:02

## 2015-02-11 NOTE — Discharge Summary (Signed)
PATIENT NAME:  Charles Rangel, Charles Rangel DATE OF BIRTH:  08-28-1976  DATE OF ADMISSION:  11/13/2011 DATE OF DISCHARGE:  11/15/2011  PRESENTING COMPLAINT: Cough and shortness of breath.   DISCHARGE DIAGNOSES:  1. Pneumonia.  2. Hypertension.  3. Morbid obesity.  4. Obstructive sleep apnea, on CPAP. Saturations are 95% on room air.   DISCHARGE MEDICATIONS:  1. Combivent inhaler 2 puffs every six hours.  2. Norvasc 10 mg p.o. daily.  3. Ambien 10 mg p.o. at bedtime p.r.n.  4. Lasix 20 mg p.o. daily.  5. Levaquin 750 mg p.o. daily for three more days.   DISCHARGE INSTRUCTIONS/FOLLOWUP: Use your CPAP as before. Keep log of her blood pressure at home for PCP to review. [facility] to 2 weeks.   LABS/STUDIES: CBC within normal limits.   Blood culture negative in 36 hours.   CT of the chest without contrast shows bilateral patchy ground-glass opacities involving the upper and lower lobes, most severe in the right lung, concerning for infectious or inflammatory etiology.   B-type natriuretic peptide 17. Basic metabolic panel within normal limits.   VITALS ON DISCHARGE: Vitals on discharge are stable. Blood pressure is 153/81, saturation 95% on room air, and pulse is 88.   BRIEF SUMMARY OF HOSPITAL COURSE: [name] is a 39 year old morbidly obese African American gentleman who came into the Emergency Room with:  1. Bilateral pneumonia: The patient developed sinus congestion initially to begin with which progressed to productive cough and low-grade fever at home. He was started on high-dose IV Levaquin. He underwent CT scan of the chest which revealed bilateral pneumonia. The patient remained afebrile. His blood cultures were negative. His vitals remained stable. He will continue high-dose Levaquin for a total of five days. The patient's saturations remained stable as well.  2. Chronic obstructive pulmonary: PRN nebulizers were continued.   3. Hypertension: Lasix and Norvasc were continued.  4. Obstructive sleep apnea: The patient did continue his CPAP while in house.  5. Morbid obesity: The patient was recommended lifestyle changes.   His hospital stay otherwise remained stable. The patient remained a FULL CODE.   TIME SPENT: 40 minutes. ____________________________ Wylie HailSona A. Allena KatzPatel, MD sap:slb D: 11/16/2011 07:30:24 ET T: 11/17/2011 09:36:40 ET JOB#: [id number]  cc: Lashan Macias A. Allena KatzPatel, MD, <Dictator> [facility] Willow OraSONA A Alfonzo Arca MD ELECTRONICALLY SIGNED 11/22/2011 15:38

## 2015-02-11 NOTE — H&P (Signed)
PATIENT NAME:  Charles Rangel, Charles Rangel MR#:  [id number] DATE OF BIRTH:  Jul 30, 1976  DATE OF ADMISSION:  11/13/2011  ADDENDUM:  Continuation of dictation.   PHYSICAL EXAMINATION:   ABDOMEN: Soft. No distention. No tenderness. Obese. It is difficult to estimate whether the patient has organomegaly.   EXTREMITIES: No edema, clubbing, or cyanosis. No calf tenderness.   SKIN: No rash or jaundice.   NEUROLOGY: Alert and oriented x3. No focal deficit. Deep tendon reflexes 2+.  CAT scan of the chest shows bilateral patchy ground-glass opacities involving the upper and lower lobes, most severe in the right lung, most concerning for an infectious or inflammatory etiology.   BNP 17. WBC 6.3, hemoglobin 14.6, platelets 173. Glucose 101, BUN 10, creatinine 0.7, sodium 141, potassium 4.6, chloride 102, bicarbonate 30.   Chest x-ray is consistent with diffuse moderate to severe pulmonary edema.  EKG shows sinus tachycardia at 106 beats per minute with T wave abnormality.   IMPRESSION:  1. Pneumonia 2. chronic obstructive pulmonary disease.  3. Hypertension.  4. Morbid obesity.  5. Obstructive sleep apnea.      PLAN OF TREATMENT: The patient will be admitted to the medical floor. We will continue 02 by nasal cannula. Oximetry monitor. Nebulizer p.r.n. We will start Zithromax and Levaquin and continue hypertension medication. GI and DVT prophylaxis.   TIME SPENT: Approximately 60 minutes. ____________________________ Shaune PollackQing Heyli Min, MD qc:slb D: 11/14/2011 15:19:14 ET T: 11/14/2011 15:46:35 ET JOB#: [id number]  cc: Shaune PollackQing Julliana Whitmyer, MD, <Dictator> Shaune PollackQING Robet Crutchfield MD ELECTRONICALLY SIGNED 11/14/2011 18:02

## 2017-08-27 ENCOUNTER — Emergency Department
Admission: EM | Admit: 2017-08-27 | Discharge: 2017-08-27 | Disposition: A | Discharge status: Home / Self Care | Payer: Self-pay | Attending: Emergency Medicine | Admitting: Emergency Medicine

## 2017-08-27 ENCOUNTER — Other Ambulatory Visit: Payer: Self-pay

## 2017-08-27 ENCOUNTER — Encounter: Payer: Self-pay | Admitting: Emergency Medicine

## 2017-08-27 DIAGNOSIS — I10 Essential (primary) hypertension: Secondary | ICD-10-CM | POA: Insufficient documentation

## 2017-08-27 DIAGNOSIS — E119 Type 2 diabetes mellitus without complications: Secondary | ICD-10-CM | POA: Insufficient documentation

## 2017-08-27 DIAGNOSIS — I509 Heart failure, unspecified: Secondary | ICD-10-CM | POA: Insufficient documentation

## 2017-08-27 DIAGNOSIS — Z7982 Long term (current) use of aspirin: Secondary | ICD-10-CM | POA: Insufficient documentation

## 2017-08-27 DIAGNOSIS — Z79899 Other long term (current) drug therapy: Secondary | ICD-10-CM | POA: Insufficient documentation

## 2017-08-27 DIAGNOSIS — M545 Low back pain, unspecified: Secondary | ICD-10-CM

## 2017-08-27 HISTORY — DX: Type 2 diabetes mellitus without complications: E11.9

## 2017-08-27 HISTORY — DX: Essential (primary) hypertension: I10

## 2017-08-27 LAB — URINALYSIS, COMPLETE (UACMP) WITH MICROSCOPIC
BACTERIA UA: NONE SEEN
Bilirubin Urine: NEGATIVE
Glucose, UA: NEGATIVE mg/dL
Hgb urine dipstick: NEGATIVE
KETONES UR: NEGATIVE mg/dL
LEUKOCYTES UA: NEGATIVE
Nitrite: NEGATIVE
PH: 6 (ref 5.0–8.0)
Protein, ur: NEGATIVE mg/dL
SPECIFIC GRAVITY, URINE: 1.011 (ref 1.005–1.030)
SQUAMOUS EPITHELIAL / LPF: NONE SEEN

## 2017-08-27 LAB — CBC WITH DIFFERENTIAL/PLATELET
Basophils Absolute: 0 10*3/uL (ref 0–0.1)
Basophils Relative: 1 %
Eosinophils Absolute: 0.1 10*3/uL (ref 0–0.7)
Eosinophils Relative: 2 %
HEMATOCRIT: 40.8 % (ref 40.0–52.0)
Hemoglobin: 13.2 g/dL (ref 13.0–18.0)
LYMPHS PCT: 22 %
Lymphs Abs: 1.4 10*3/uL (ref 1.0–3.6)
MCH: 26.1 pg (ref 26.0–34.0)
MCHC: 32.3 g/dL (ref 32.0–36.0)
MCV: 80.8 fL (ref 80.0–100.0)
MONO ABS: 0.7 10*3/uL (ref 0.2–1.0)
MONOS PCT: 11 %
NEUTROS ABS: 4.1 10*3/uL (ref 1.4–6.5)
Neutrophils Relative %: 64 %
Platelets: 230 10*3/uL (ref 150–440)
RBC: 5.05 MIL/uL (ref 4.40–5.90)
RDW: 17.4 % — AB (ref 11.5–14.5)
WBC: 6.3 10*3/uL (ref 3.8–10.6)

## 2017-08-27 LAB — BASIC METABOLIC PANEL
ANION GAP: 13 (ref 5–15)
BUN: 24 mg/dL — ABNORMAL HIGH (ref 6–20)
CHLORIDE: 96 mmol/L — AB (ref 101–111)
CO2: 28 mmol/L (ref 22–32)
Calcium: 9.9 mg/dL (ref 8.9–10.3)
Creatinine, Ser: 1.73 mg/dL — ABNORMAL HIGH (ref 0.61–1.24)
GFR calc Af Amer: 54 mL/min — ABNORMAL LOW (ref >60)
GFR calc non Af Amer: 47 mL/min — ABNORMAL LOW (ref >60)
GLUCOSE: 114 mg/dL — AB (ref 65–99)
POTASSIUM: 4.3 mmol/L (ref 3.5–5.1)
Sodium: 137 mmol/L (ref 135–145)

## 2017-08-27 MED ORDER — NAPROXEN 500 MG PO TABS: 500.0000 mg | ORAL_TABLET | Freq: Two times a day (BID) | ORAL | 0 refills | Status: DC

## 2017-08-27 NOTE — ED Provider Notes (Signed)
[facility] Emergency Department Provider Note  ____________________________________________  Time seen: Approximately 11:10 AM  I have reviewed the triage vital signs and the nursing notes.   HISTORY  Chief Complaint Flank Pain    HPI Charles Rangel is a 41 y.o. male who complains of right flank pain for the past 2 days, constant, gradually worsening, moderate intensity, waxing and waning. Worse with movements. No alleviating factors. Nonradiating. No dysuria frequency urgency or hematuria. No fevers chills or sweats. No vomiting or constipation.     Past Medical History:  Diagnosis Date  . Diabetes mellitus without complication (HCC)   . Hypertension      There are no active problems to display for this patient.    History reviewed. No pertinent surgical history.   Prior to Admission medications   Medication Sig Start Date End Date Taking? Authorizing Provider  aspirin EC 81 MG tablet Take 81 mg daily by mouth.   Yes [provider]  bumetanide (BUMEX) 1 MG tablet Take 2 mg 2 (two) times daily by mouth.    Yes [provider]  CARVEDILOL PO Take 2 (two) times daily with a meal by mouth.    Yes [provider]  lisinopril (PRINIVIL,ZESTRIL) 5 MG tablet Take 0.5 tablets by mouth.    Yes [provider]  naproxen (NAPROSYN) 500 MG tablet Take 1 tablet (500 mg total) 2 (two) times daily with a meal by mouth. 08/27/17   Sharman CheekStafford, Casidy Alberta, MD     Allergies Tylenol [acetaminophen]   No family history on file.  Social History Social History   Tobacco Use  . Smoking status: Never Smoker  . Smokeless tobacco: Never Used  Substance Use Topics  . Alcohol use: No    Frequency: Never  . Drug use: No    Review of Systems  Constitutional:   No fever or chills.  ENT:   No sore throat. No rhinorrhea. Cardiovascular:   No chest pain or syncope. Respiratory:   No dyspnea or cough. Gastrointestinal:    Negative for abdominal pain, vomiting and diarrhea.  Musculoskeletal:   Right low back pain as above. Chronic trace pedal edema, unchanged. All other systems reviewed and are negative except as documented above in ROS and HPI.  ____________________________________________   PHYSICAL EXAM:  VITAL SIGNS: ED Triage Vitals  Enc Vitals Group     BP 08/27/17 0755 138/87     Pulse Rate 08/27/17 0755 90     Resp 08/27/17 0755 20     Temp 08/27/17 0755 97.7 F (36.5 C)     Temp Source 08/27/17 0755 Oral     SpO2 08/27/17 0755 99 %     Weight 08/27/17 0756 210 lb (95.3 kg)     Height --      Head Circumference --      Peak Flow --      Pain Score 08/27/17 0755 8     Pain Loc --      Pain Edu? --      Excl. in GC? --     Vital signs reviewed, nursing assessments reviewed.   Constitutional:   Alert and oriented. Well appearing and in no distress. Eyes:   No scleral icterus.  EOMI. No nystagmus. No conjunctival pallor. PERRL. ENT   Head:   Normocephalic and atraumatic.   Nose:   No congestion/rhinnorhea.    Mouth/Throat:   MMM, no pharyngeal erythema. No peritonsillar mass.    Neck:   No meningismus.  Full ROM. Hematological/Lymphatic/Immunilogical:   No cervical lymphadenopathy. Cardiovascular:   RRR. Symmetric bilateral radial and DP pulses.  No murmurs.  Respiratory:   Normal respiratory effort without tachypnea/retractions. Breath sounds are clear and equal bilaterally. No wheezes/rales/rhonchi. Gastrointestinal:   Soft and nontender. Non distended. There is no CVA tenderness.  No rebound, rigidity, or guarding. Genitourinary:   deferred Musculoskeletal:   Normal range of motion in all extremities. No joint effusions.  No lower extremity tenderness.  Trace pedal edema bilaterally. Symmetric. Negative Homans sign, no calf tenderness. Symmetric circumference. There is tenderness in the right lower back musculature reproducing his symptoms. Straight leg raise  negative.. Neurologic:   Normal speech and language.  Motor grossly intact. No gross focal neurologic deficits are appreciated.  Skin:    Skin is warm, dry and intact. No rash noted.  No petechiae, purpura, or bullae.  ____________________________________________    LABS (pertinent positives/negatives) (all labs ordered are listed, but only abnormal results are displayed) Labs Reviewed  BASIC METABOLIC PANEL - Abnormal; Notable for the following components:      Result Value   Chloride 96 (*)    Glucose, Bld 114 (*)    BUN 24 (*)    Creatinine, Ser 1.73 (*)    GFR calc non Af Amer 47 (*)    GFR calc Af Amer 54 (*)    All other components within normal limits  CBC WITH DIFFERENTIAL/PLATELET - Abnormal; Notable for the following components:   RDW 17.4 (*)    All other components within normal limits  URINALYSIS, COMPLETE (UACMP) WITH MICROSCOPIC - Abnormal; Notable for the following components:   Color, Urine YELLOW (*)    APPearance CLEAR (*)    All other components within normal limits   ____________________________________________   EKG    ____________________________________________    RADIOLOGY  No results found.  ____________________________________________   PROCEDURES Procedures  ____________________________________________   DIFFERENTIAL DIAGNOSIS  Low back pain, muscle strain, UTI, pyelonephritis, kidney stone  CLINICAL IMPRESSION / ASSESSMENT AND PLAN / ED COURSE  Pertinent labs & imaging results that were available during my care of the patient were reviewed by me and considered in my medical decision making (see chart for details).   Well-appearing no acute distress, presents with reproducible right low back pain. Low suspicion for spinal fracture or cauda equina or epidural abscess. No evidence of AAA dissection or other vascular problems. Vital signs are normal, patient is calm and comfortable. Labs and urinalysis are unremarkable. I'll  discharge home to follow up with primary care. His creatinine is slightly increased from his baseline, likely some mild dehydration. I'll have him skip one day of his Bumex and [facility] or the heart failure clinic.  Clinical Course as of Aug 27 1109  Thu Aug 27, 2017  1007 Labs unremarkable.  Slightly increased creatinine. Will advise to skip one dose of bumex and f/u heart failure clinic.   [PS]    Clinical Course User Index [PS] Sharman CheekStafford, Jniya Madara, MD     ____________________________________________   FINAL CLINICAL IMPRESSION(S) / ED DIAGNOSES    Final diagnoses:  Acute right-sided low back pain without sciatica  Chronic congestive heart failure, unspecified heart failure type (HCC)      This SmartLink is deprecated. Use AVSMEDLIST instead to display the medication list for a patient.   Portions of this note were generated with dragon dictation software. Dictation errors may occur despite best attempts at proofreading.  Sharman Cheek, MD 08/27/17 1115

## 2017-08-27 NOTE — ED Notes (Signed)
ED Provider at bedside. Pt has questions about discharge information.

## 2017-08-27 NOTE — ED Notes (Signed)
Pt alert and oriented X4, active, cooperative, pt in NAD. RR even and unlabored, color WNL.  Pt informed to return if any life threatening symptoms occur.  Discharge and followup instructions reviewed.  

## 2017-08-27 NOTE — ED Notes (Signed)
Pt states right flank pain, states hx of same with AKI, states pain for 2 days, awake and alert in no acute distress, pt on fluid restriction for hx of CHF

## 2017-08-27 NOTE — ED Notes (Signed)
EDP at bedside  

## 2017-08-27 NOTE — Discharge Instructions (Signed)
Your tests today are unremarkable, except for a slight decrease in kidney function.  Skip your Bumex for one day to allow this to recover, and follow up with your doctor or the heart failure clinic in one day.

## 2017-08-27 NOTE — ED Triage Notes (Signed)
Pt with right side flank pain.

## 2018-03-04 ENCOUNTER — Other Ambulatory Visit: Payer: Self-pay

## 2018-03-04 ENCOUNTER — Inpatient Hospital Stay
Admission: EM | Admit: 2018-03-04 | Discharge: 2018-03-07 | DRG: 190 | Disposition: A | Discharge status: Home / Self Care | Payer: Managed Care, Other (non HMO) | Attending: Specialist | Admitting: Specialist

## 2018-03-04 ENCOUNTER — Emergency Department: Payer: Managed Care, Other (non HMO)

## 2018-03-04 DIAGNOSIS — Z87891 Personal history of nicotine dependence: Secondary | ICD-10-CM | POA: Diagnosis not present

## 2018-03-04 DIAGNOSIS — G4733 Obstructive sleep apnea (adult) (pediatric): Secondary | ICD-10-CM | POA: Diagnosis present

## 2018-03-04 DIAGNOSIS — R0602 Shortness of breath: Secondary | ICD-10-CM | POA: Diagnosis present

## 2018-03-04 DIAGNOSIS — I509 Heart failure, unspecified: Secondary | ICD-10-CM

## 2018-03-04 DIAGNOSIS — Z6841 Body Mass Index (BMI) 40.0 and over, adult: Secondary | ICD-10-CM

## 2018-03-04 DIAGNOSIS — J9601 Acute respiratory failure with hypoxia: Secondary | ICD-10-CM | POA: Diagnosis present

## 2018-03-04 DIAGNOSIS — I5033 Acute on chronic diastolic (congestive) heart failure: Secondary | ICD-10-CM | POA: Diagnosis present

## 2018-03-04 DIAGNOSIS — A419 Sepsis, unspecified organism: Secondary | ICD-10-CM

## 2018-03-04 DIAGNOSIS — I110 Hypertensive heart disease with heart failure: Secondary | ICD-10-CM | POA: Diagnosis present

## 2018-03-04 DIAGNOSIS — E1122 Type 2 diabetes mellitus with diabetic chronic kidney disease: Secondary | ICD-10-CM | POA: Diagnosis present

## 2018-03-04 DIAGNOSIS — Z86718 Personal history of other venous thrombosis and embolism: Secondary | ICD-10-CM | POA: Diagnosis not present

## 2018-03-04 DIAGNOSIS — Z7982 Long term (current) use of aspirin: Secondary | ICD-10-CM

## 2018-03-04 DIAGNOSIS — J441 Chronic obstructive pulmonary disease with (acute) exacerbation: Principal | ICD-10-CM | POA: Diagnosis present

## 2018-03-04 DIAGNOSIS — Z791 Long term (current) use of non-steroidal anti-inflammatories (NSAID): Secondary | ICD-10-CM

## 2018-03-04 DIAGNOSIS — J9600 Acute respiratory failure, unspecified whether with hypoxia or hypercapnia: Secondary | ICD-10-CM

## 2018-03-04 DIAGNOSIS — E119 Type 2 diabetes mellitus without complications: Secondary | ICD-10-CM

## 2018-03-04 HISTORY — DX: Obstructive sleep apnea (adult) (pediatric): G47.33

## 2018-03-04 HISTORY — DX: Heart failure, unspecified: I50.9

## 2018-03-04 HISTORY — DX: Chronic obstructive pulmonary disease, unspecified: J44.9

## 2018-03-04 LAB — BASIC METABOLIC PANEL
ANION GAP: 14 (ref 5–15)
BUN: 20 mg/dL (ref 6–20)
CHLORIDE: 92 mmol/L — AB (ref 101–111)
CO2: 30 mmol/L (ref 22–32)
Calcium: 9.8 mg/dL (ref 8.9–10.3)
Creatinine, Ser: 1.4 mg/dL — ABNORMAL HIGH (ref 0.61–1.24)
GFR calc Af Amer: >60 mL/min (ref >60)
GFR calc non Af Amer: >60 mL/min (ref >60)
GLUCOSE: 121 mg/dL — AB (ref 65–99)
POTASSIUM: 3.7 mmol/L (ref 3.5–5.1)
Sodium: 136 mmol/L (ref 135–145)

## 2018-03-04 LAB — CBC WITH DIFFERENTIAL/PLATELET
BASOS ABS: 0 10*3/uL (ref 0–0.1)
Basophils Relative: 0 %
Eosinophils Absolute: 0.5 10*3/uL (ref 0–0.7)
Eosinophils Relative: 4 %
HEMATOCRIT: 41.8 % (ref 40.0–52.0)
Hemoglobin: 13.7 g/dL (ref 13.0–18.0)
LYMPHS ABS: 0.9 10*3/uL — AB (ref 1.0–3.6)
LYMPHS PCT: 8 %
MCH: 25.9 pg — AB (ref 26.0–34.0)
MCHC: 32.8 g/dL (ref 32.0–36.0)
MCV: 78.8 fL — AB (ref 80.0–100.0)
MONO ABS: 1.1 10*3/uL — AB (ref 0.2–1.0)
MONOS PCT: 10 %
NEUTROS ABS: 8.6 10*3/uL — AB (ref 1.4–6.5)
Neutrophils Relative %: 78 %
Platelets: 213 10*3/uL (ref 150–440)
RBC: 5.3 MIL/uL (ref 4.40–5.90)
RDW: 18.6 % — AB (ref 11.5–14.5)
WBC: 11 10*3/uL — ABNORMAL HIGH (ref 3.8–10.6)

## 2018-03-04 LAB — BLOOD GAS, ARTERIAL
Acid-Base Excess: 7 mmol/L — ABNORMAL HIGH (ref 0.0–2.0)
Bicarbonate: 34.8 mmol/L — ABNORMAL HIGH (ref 20.0–28.0)
FIO2: 0.36
O2 SAT: 88.7 %
PCO2 ART: 63 mmHg — AB (ref 32.0–48.0)
PO2 ART: 59 mmHg — AB (ref 83.0–108.0)
Patient temperature: 37
pH, Arterial: 7.35 (ref 7.350–7.450)

## 2018-03-04 LAB — TROPONIN I: Troponin I: <0.03 ng/mL (ref <0.03)

## 2018-03-04 LAB — BLOOD GAS, VENOUS
Acid-Base Excess: 11 mmol/L — ABNORMAL HIGH (ref 0.0–2.0)
Bicarbonate: 38.7 mmol/L — ABNORMAL HIGH (ref 20.0–28.0)
O2 SAT: 94.4 %
PATIENT TEMPERATURE: 37
PO2 VEN: 72 mmHg — AB (ref 32.0–45.0)
pCO2, Ven: 61 mmHg — ABNORMAL HIGH (ref 44.0–60.0)
pH, Ven: 7.41 (ref 7.250–7.430)

## 2018-03-04 LAB — LACTIC ACID, PLASMA: LACTIC ACID, VENOUS: 0.9 mmol/L (ref 0.5–1.9)

## 2018-03-04 LAB — MAGNESIUM: MAGNESIUM: 1.9 mg/dL (ref 1.7–2.4)

## 2018-03-04 LAB — MRSA PCR SCREENING: MRSA BY PCR: NEGATIVE

## 2018-03-04 LAB — PROCALCITONIN: Procalcitonin: 0.19 ng/mL

## 2018-03-04 LAB — BRAIN NATRIURETIC PEPTIDE: B Natriuretic Peptide: 47 pg/mL (ref 0.0–100.0)

## 2018-03-04 LAB — GLUCOSE, CAPILLARY: Glucose-Capillary: 153 mg/dL — ABNORMAL HIGH (ref 65–99)

## 2018-03-04 MED ORDER — SODIUM CHLORIDE 0.9 % IV SOLN
1.0000 g | INTRAVENOUS | Status: DC
  Administered 2018-03-05 – 2018-03-06 (×2): 1 g via INTRAVENOUS
  Filled 2018-03-04: qty 10
  Filled 2018-03-04: qty 1
  Filled 2018-03-04: qty 10

## 2018-03-04 MED ORDER — MORPHINE SULFATE (PF) 2 MG/ML IV SOLN: 2.0000 mg | INTRAVENOUS | Status: DC | PRN

## 2018-03-04 MED ORDER — HEPARIN SODIUM (PORCINE) 5000 UNIT/ML IJ SOLN
5000.0000 [IU] | Freq: Three times a day (TID) | INTRAMUSCULAR | Status: DC
  Administered 2018-03-04 – 2018-03-07 (×6): 5000 [IU] via SUBCUTANEOUS
  Filled 2018-03-04 (×6): qty 1

## 2018-03-04 MED ORDER — SENNOSIDES-DOCUSATE SODIUM 8.6-50 MG PO TABS: 1.0000 | ORAL_TABLET | Freq: Every evening | ORAL | Status: DC | PRN

## 2018-03-04 MED ORDER — DM-GUAIFENESIN ER 30-600 MG PO TB12: 1.0000 | ORAL_TABLET | Freq: Two times a day (BID) | ORAL | Status: DC

## 2018-03-04 MED ORDER — SODIUM CHLORIDE 0.9 % IV SOLN
INTRAVENOUS | Status: AC
  Filled 2018-03-04: qty 500

## 2018-03-04 MED ORDER — SODIUM CHLORIDE 0.9 % IV SOLN
500.0000 mg | INTRAVENOUS | Status: DC
  Administered 2018-03-04 – 2018-03-05 (×2): 500 mg via INTRAVENOUS
  Filled 2018-03-04 (×2): qty 500

## 2018-03-04 MED ORDER — GUAIFENESIN ER 600 MG PO TB12
600.0000 mg | ORAL_TABLET | Freq: Two times a day (BID) | ORAL | Status: DC
  Administered 2018-03-04 – 2018-03-07 (×6): 600 mg via ORAL
  Filled 2018-03-04 (×6): qty 1

## 2018-03-04 MED ORDER — INSULIN ASPART 100 UNIT/ML ~~LOC~~ SOLN: 0.0000 [IU] | Freq: Every day | SUBCUTANEOUS | Status: DC

## 2018-03-04 MED ORDER — FUROSEMIDE 10 MG/ML IJ SOLN
40.0000 mg | Freq: Once | INTRAMUSCULAR | Status: AC
  Administered 2018-03-04: 40 mg via INTRAVENOUS
  Filled 2018-03-04: qty 4

## 2018-03-04 MED ORDER — IPRATROPIUM-ALBUTEROL 0.5-2.5 (3) MG/3ML IN SOLN
3.0000 mL | Freq: Once | RESPIRATORY_TRACT | Status: AC
  Administered 2018-03-04: 3 mL via RESPIRATORY_TRACT

## 2018-03-04 MED ORDER — CEFTRIAXONE SODIUM 2 G IJ SOLR
2.0000 g | INTRAMUSCULAR | Status: DC
Start: 2018-03-05 — End: 2018-03-04

## 2018-03-04 MED ORDER — SODIUM CHLORIDE 0.9 % IV SOLN: 250.0000 mL | INTRAVENOUS | Status: DC | PRN

## 2018-03-04 MED ORDER — SODIUM CHLORIDE 0.9 % IV SOLN
INTRAVENOUS | Status: DC
  Administered 2018-03-04: 20:00:00 via INTRAVENOUS

## 2018-03-04 MED ORDER — ONDANSETRON HCL 4 MG/2ML IJ SOLN: 4.0000 mg | Freq: Four times a day (QID) | INTRAMUSCULAR | Status: DC | PRN

## 2018-03-04 MED ORDER — SODIUM CHLORIDE 0.9 % IV SOLN
1.0000 g | Freq: Once | INTRAVENOUS | Status: AC
  Administered 2018-03-04: 1 g via INTRAVENOUS
  Filled 2018-03-04: qty 10

## 2018-03-04 MED ORDER — FUROSEMIDE 10 MG/ML IJ SOLN
40.0000 mg | Freq: Once | INTRAMUSCULAR | Status: AC
Start: 2018-03-04 — End: 2018-03-04
  Administered 2018-03-04: 40 mg via INTRAVENOUS
  Filled 2018-03-04: qty 4

## 2018-03-04 MED ORDER — SODIUM CHLORIDE 0.9% FLUSH
3.0000 mL | Freq: Two times a day (BID) | INTRAVENOUS | Status: DC
  Administered 2018-03-04 – 2018-03-07 (×6): 3 mL via INTRAVENOUS

## 2018-03-04 MED ORDER — ALBUTEROL SULFATE (2.5 MG/3ML) 0.083% IN NEBU: 2.5000 mg | INHALATION_SOLUTION | RESPIRATORY_TRACT | Status: DC | PRN

## 2018-03-04 MED ORDER — BISACODYL 5 MG PO TBEC
5.0000 mg | DELAYED_RELEASE_TABLET | Freq: Every day | ORAL | Status: DC | PRN
  Filled 2018-03-04: qty 1

## 2018-03-04 MED ORDER — IPRATROPIUM-ALBUTEROL 0.5-2.5 (3) MG/3ML IN SOLN
3.0000 mL | Freq: Once | RESPIRATORY_TRACT | Status: AC
  Administered 2018-03-04: 3 mL via RESPIRATORY_TRACT
  Filled 2018-03-04: qty 9

## 2018-03-04 MED ORDER — GUAIFENESIN-DM 100-10 MG/5ML PO SYRP
5.0000 mL | ORAL_SOLUTION | ORAL | Status: DC | PRN
  Filled 2018-03-04: qty 5

## 2018-03-04 MED ORDER — ALBUTEROL SULFATE (2.5 MG/3ML) 0.083% IN NEBU
2.5000 mg | INHALATION_SOLUTION | Freq: Four times a day (QID) | RESPIRATORY_TRACT | Status: DC
  Administered 2018-03-04 – 2018-03-05 (×2): 2.5 mg via RESPIRATORY_TRACT
  Filled 2018-03-04 (×2): qty 3

## 2018-03-04 MED ORDER — SODIUM CHLORIDE 0.9% FLUSH: 3.0000 mL | INTRAVENOUS | Status: DC | PRN

## 2018-03-04 MED ORDER — ONDANSETRON HCL 4 MG PO TABS
4.0000 mg | ORAL_TABLET | Freq: Four times a day (QID) | ORAL | Status: DC | PRN
Start: 2018-03-04 — End: 2018-03-07

## 2018-03-04 MED ORDER — SODIUM CHLORIDE 0.9 % IV SOLN
INTRAVENOUS | Status: AC
  Filled 2018-03-04: qty 10

## 2018-03-04 MED ORDER — DEXTROMETHORPHAN POLISTIREX ER 30 MG/5ML PO SUER
30.0000 mg | Freq: Two times a day (BID) | ORAL | Status: DC
  Administered 2018-03-05 – 2018-03-07 (×5): 30 mg via ORAL
  Filled 2018-03-04 (×7): qty 5

## 2018-03-04 MED ORDER — INSULIN ASPART 100 UNIT/ML ~~LOC~~ SOLN
0.0000 [IU] | Freq: Three times a day (TID) | SUBCUTANEOUS | Status: DC
  Administered 2018-03-05 (×2): 2 [IU] via SUBCUTANEOUS
  Administered 2018-03-05: 3 [IU] via SUBCUTANEOUS
  Administered 2018-03-06 (×2): 5 [IU] via SUBCUTANEOUS
  Administered 2018-03-06: 2 [IU] via SUBCUTANEOUS
  Administered 2018-03-07: 3 [IU] via SUBCUTANEOUS
  Filled 2018-03-04 (×6): qty 1

## 2018-03-04 MED ORDER — METHYLPREDNISOLONE SODIUM SUCC 125 MG IJ SOLR
125.0000 mg | Freq: Once | INTRAMUSCULAR | Status: AC
  Administered 2018-03-04: 125 mg via INTRAVENOUS
  Filled 2018-03-04: qty 2

## 2018-03-04 NOTE — Consult Note (Signed)
PULMONARY / CRITICAL CARE MEDICINE   Name: Charles Rangel MRN: [id number] DOB: 1976-05-05    ADMISSION DATE:  03/04/2018   CONSULTATION DATE: 03/04/2018   REFERRING MD: Dr. Imogene Burn  Reason: Acute hypoxic respiratory failure  HISTORY OF PRESENT ILLNESS:   This is a 42 year old African-American male with a history of congestive heart failure, COPD, type 2 diabetes, hypertension, and obstructive sleep apnea on home CPAP who presented to the ED with hypoxemia, left-sided chest pain and worsening shortness of breath.  Patient states that symptoms started early in the week with fever, chills, left-sided chest pain and a productive cough with yellow sputum. He went to the [place] on 03/03/2018 and was diagnosed with a URI, treated and discharged home on prednisone and an inhaler.  Symptoms got worse today hence patient went to the urgent care center.  At the urgent care  center he was found to be severely hypoxic and has referred to the emergency room.  At the ED, patient was placed on BiPAP.  His chest x-ray showed diffuse pulmonary edema and questionable bilateral lower lobe infiltrates.  He is being admitted for community-acquired pneumonia, acute pulmonary edema and acute hypoxic respiratory failure.  PAST MEDICAL HISTORY :  He  has a past medical history of CHF (congestive heart failure) (HCC), COPD (chronic obstructive pulmonary disease) (HCC), Diabetes mellitus without complication (HCC), DVT (deep vein thrombosis) in pregnancy (HCC), HTN (hypertension), Hypertension, and OSA (obstructive sleep apnea).  PAST SURGICAL HISTORY: He  has a past surgical history that includes Left leg surgery for fracture.  Allergies  Allergen Reactions  . Tylenol [Acetaminophen]     No current facility-administered medications on file prior to encounter.    Current Outpatient Medications on File Prior to Encounter  Medication Sig  . aspirin EC 81 MG tablet Take 81 mg daily by mouth.  . bumetanide (BUMEX) 2 MG  tablet Take 2 mg by mouth 2 (two) times daily.   . carvedilol (COREG) 25 MG tablet Take 1 tablet by mouth 2 (two) times daily with a meal.   . Cholecalciferol (VITAMIN D3) 1000 units CAPS Take 1 capsule by mouth daily.   . cyclobenzaprine (FLEXERIL) 10 MG tablet Take 1 tablet by mouth 3 (three) times daily as needed.  Marland Kitchen lisinopril (PRINIVIL,ZESTRIL) 5 MG tablet Take 2.5 tablets by mouth.   . metFORMIN (GLUCOPHAGE) 1000 MG tablet Take 1,000 mg by mouth 2 (two) times daily with a meal.    FAMILY HISTORY:  His indicated that the status of his mother is unknown.   SOCIAL HISTORY: He  reports that he has quit smoking. He has never used smokeless tobacco. He reports that he drinks alcohol. He reports that he does not use drugs.  REVIEW OF SYSTEMS:   Unable to obtain as patient is on continuous BiPAP and still dyspneic  SUBJECTIVE:   VITAL SIGNS: BP (!) 142/89   Pulse (!) 102   Temp 99.9 F (37.7 C) (Oral)   Resp 20   Ht  (1.702 m)   Wt (!) 310 lb (140.6 kg)   SpO2 95%   BMI 48.55 kg/m   HEMODYNAMICS:    VENTILATOR SETTINGS: FiO2 (%):  [40 %] 40 %  INTAKE / OUTPUT: No intake/output data recorded.  PHYSICAL EXAMINATION: General: In moderate respiratory distress Neuro: Alert and oriented x4, no focal deficits HEENT: PERRLA, trachea midline, neck is supple with good range of motion Cardiovascular: Apical pulse regular, S1-S2, no murmur regurg or gallop, +2 pulses in bilateral  upper extremities, +1 in bilateral lower extremities, +2 nonpitting edema Lungs: Breath sounds diminished in all lung fields, mild expiratory wheezes, bibasilar crackles Abdomen: Obese, normal bowel sounds in all 4 quadrants, palpation reveals no organomegaly Musculoskeletal: No joint deformities, positive range of motion in upper and lower extremities Skin: Warm and dry  LABS:  BMET Recent Labs  Lab 03/04/18 1653  NA 136  K 3.7  CL 92*  CO2 30  BUN 20  CREATININE 1.40*  GLUCOSE 121*     Electrolytes Recent Labs  Lab 03/04/18 1653 03/04/18 2010  CALCIUM 9.8  --   MG  --  1.9    CBC Recent Labs  Lab 03/04/18 1653  WBC 11.0*  HGB 13.7  HCT 41.8  PLT 213    Coag's No results for input(s): APTT, INR in the last 168 hours.  Sepsis Markers Recent Labs  Lab 03/04/18 1654  LATICACIDVEN 0.9    ABG No results for input(s): PHART, PCO2ART, PO2ART in the last 168 hours.  Liver Enzymes No results for input(s): AST, ALT, ALKPHOS, BILITOT, ALBUMIN in the last 168 hours.  Cardiac Enzymes Recent Labs  Lab 03/04/18 1653  TROPONINI <0.03    Glucose No results for input(s): GLUCAP in the last 168 hours.  Imaging Dg Chest Portable 1 View  Result Date: 03/04/2018 CLINICAL DATA:  Diagnosed with upper respiratory tract infection yesterday. Shortness of breath. History of CHF. EXAM: PORTABLE CHEST 1 VIEW COMPARISON:  Chest radiograph July 28, 2014 FINDINGS: Stable moderate cardiomegaly. Pulmonary vascular congestion and mild interstitial prominence without pleural effusion or focal consolidation. No pneumothorax. Soft tissue planes and included osseous structures are nonsuspicious. IMPRESSION: Similar moderate cardiomegaly and mild interstitial edema. Electronically Signed   By: Awilda Metro M.D.   On: 03/04/2018 17:12   STUDIES:  2D echo pending  CULTURES: Blood cultures x2  ANTIBIOTICS: Ceftriaxone 03/04/2018> Azithromycin 03/04/2018>  SIGNIFICANT EVENTS: 03/04/2018: Admitted  LINES/TUBES: Peripheral IVs  DISCUSSION: 42 year old male presenting with acute hypoxic respiratory failure secondary to acute CHF exacerbation and community-acquired pneumonia  ASSESSMENT  Acute hypoxic respiratory failure necessitating BiPAP Acute CHF exacerbation- unclear if diastolic or systolic, no prior echo in EMR Community-acquired pneumonia Acute on chronic COPD exacerbation Acute pulmonary edema Chronic kidney disease-baseline creatinine 1.73; creatinine  today 1.4 Hypertension Type 2 diabetes History of DVT  PLAN Continues BiPAP and tolerating and titrate to nasal cannula as tolerated Nebulized bronchodilators IV steroids IV diuretics Antibiotics as above Follow-up cultures Trend procalcitonin and adjust antibiotics Resume home blood pressure and diabetes medications Blood glucose monitoring with sliding scale insulin coverage Start ABG reviewed-patient is still hypoxic and mildly hypercarbic Chest x-ray in the morning Monitor and correct electrolytes Subcu heparin for DVT prophylaxis No indication for GI prophylaxis   FAMILY  - Updates: Patient updated on current treatment plan  Demetrius Barrell S. [facility] ANP-BC Pulmonary and Critical Care Medicine [facility] Pager [phone] or [phone]  NB: This document was prepared using Dragon voice recognition software and may include unintentional dictation errors.    03/04/2018, 9:11 PM

## 2018-03-04 NOTE — Progress Notes (Signed)
Pt transferred off BiPaP onto 4LNC. Pt tolerating [place] no signs of SOB or distress Sats maintain in mid to high 90s. No pain at this time. Will place pt back on Bipap for sleep as they wear CPaP at home.

## 2018-03-04 NOTE — ED Triage Notes (Signed)
Pt seen at the [facility] yesterday and DX with a URI. Not feeling better today. Pt destated in the field. Placed on 5L of O2. Pt rx for steroids, flonase, and a shot of bicillan.

## 2018-03-04 NOTE — H&P (Signed)
[facility] - [place] at [facility]   PATIENT NAME: Charles Rangel    MR#:  [id number]  DATE OF BIRTH:  1976-03-11  DATE OF ADMISSION:  03/04/2018  PRIMARY CARE PHYSICIAN: [facility]   REQUESTING/REFERRING PHYSICIAN: Nita Sickle, MD  CHIEF COMPLAINT:   Chief Complaint  Patient presents with  . Shortness of Breath   Worsening cough, sputum and the shortness of breath for 2-3 days. HISTORY OF PRESENT ILLNESS:  Charles Rangel  is a 42 y.o. male with a known history of multiple medical problems as below.  The patient has had worsening shortness of breath, cough with greenish sputum, wheezing, fever and chills for the past 3 days.  He also complains of chest tightness and leg swelling.  He was found hypoxia and sepsis, put on BiPAP and treated with antibiotics in the ED.  Chest x-ray show pulmonary edema but no obvious infiltration.  PAST MEDICAL HISTORY:   Past Medical History:  Diagnosis Date  . CHF (congestive heart failure) (HCC)   . COPD (chronic obstructive pulmonary disease) (HCC)   . Diabetes mellitus without complication (HCC)   . DVT (deep vein thrombosis) in pregnancy (HCC)   . HTN (hypertension)   . Hypertension   . OSA (obstructive sleep apnea)     PAST SURGICAL HISTORY:   Past Surgical History:  Procedure Laterality Date  . Left leg surgery for fracture      SOCIAL HISTORY:   Social History   Tobacco Use  . Smoking status: Former Games developer  . Smokeless tobacco: Never Used  Substance Use Topics  . Alcohol use: Yes    Frequency: Never    Comment: rarely    FAMILY HISTORY:   Family History  Problem Relation Age of Onset  . Hypertension Mother     DRUG ALLERGIES:   Allergies  Allergen Reactions  . Tylenol [Acetaminophen]     REVIEW OF SYSTEMS:   Review of Systems  Constitutional: Positive for chills, fever and malaise/fatigue.  HENT: Negative for sore throat.   Eyes: Negative for blurred vision and  double vision.  Respiratory: Positive for cough, sputum production, shortness of breath and wheezing. Negative for hemoptysis and stridor.   Cardiovascular: Positive for chest pain and leg swelling. Negative for palpitations and orthopnea.  Gastrointestinal: Negative for abdominal pain, blood in stool, diarrhea, melena, nausea and vomiting.  Genitourinary: Negative for dysuria, flank pain and hematuria.  Musculoskeletal: Negative for back pain and joint pain.  Skin: Negative for rash.  Neurological: Negative for dizziness, sensory change, focal weakness, seizures, loss of consciousness, weakness and headaches.  Endo/Heme/Allergies: Negative for polydipsia.  Psychiatric/Behavioral: Negative for depression. The patient is not nervous/anxious.     MEDICATIONS AT HOME:   Prior to Admission medications   Medication Sig Start Date End Date Taking? Authorizing Provider  Cholecalciferol (VITAMIN D3) 1000 units CAPS Take 4 capsules by mouth daily.   Yes [provider]  cyclobenzaprine (FLEXERIL) 10 MG tablet Take 1 tablet by mouth 3 (three) times daily as needed.   Yes [provider]  aspirin EC 81 MG tablet Take 81 mg daily by mouth.    [provider]  bumetanide (BUMEX) 1 MG tablet Take 2 mg 2 (two) times daily by mouth.     [provider]  CARVEDILOL PO Take 2 (two) times daily with a meal by mouth.     [provider]  lisinopril (PRINIVIL,ZESTRIL) 5 MG tablet Take 0.5 tablets by mouth.  [provider]  naproxen (NAPROSYN) 500 MG tablet Take 1 tablet (500 mg total) 2 (two) times daily with a meal by mouth. 08/27/17   Sharman Cheek, MD      VITAL SIGNS:  Blood pressure (!) 159/98, pulse (!) 115, temperature 99.9 F (37.7 C), temperature source Oral, resp. rate (!) 27, height  (1.702 m), weight (!) 310 lb (140.6 kg), SpO2 98 %.  PHYSICAL EXAMINATION:  Physical Exam  GENERAL:  42 y.o.-year-old patient lying in the bed on  BIPAP. EYES: Pupils equal, round, reactive to light and accommodation. No scleral icterus. Extraocular muscles intact.  HEENT: Head atraumatic, normocephalic.  NECK:  Supple, no jugular venous distention. No thyroid enlargement, no tenderness.  LUNGS: Bilateral rhonchi and wheezing. No use of accessory muscles of respiration.  CARDIOVASCULAR: S1, S2 normal. No murmurs, rubs, or gallops.  ABDOMEN: Soft, nontender, nondistended. Bowel sounds present. No organomegaly or mass.  EXTREMITIES: No cyanosis, or clubbing.  Chronic skin changes and trace edema. NEUROLOGIC: Cranial nerves II through XII are intact. Muscle strength 4/5 in all extremities. Sensation intact. Gait not checked.  PSYCHIATRIC: The patient is alert and oriented x 3.  SKIN: No obvious rash, lesion, or ulcer.   LABORATORY PANEL:   CBC Recent Labs  Lab 03/04/18 1653  WBC 11.0*  HGB 13.7  HCT 41.8  PLT 213   ------------------------------------------------------------------------------------------------------------------  Chemistries  Recent Labs  Lab 03/04/18 1653  NA 136  K 3.7  CL 92*  CO2 30  GLUCOSE 121*  BUN 20  CREATININE 1.40*  CALCIUM 9.8   ------------------------------------------------------------------------------------------------------------------  Cardiac Enzymes Recent Labs  Lab 03/04/18 1653  TROPONINI <0.03   ------------------------------------------------------------------------------------------------------------------  RADIOLOGY:  Dg Chest Portable 1 View  Result Date: 03/04/2018 CLINICAL DATA:  Diagnosed with upper respiratory tract infection yesterday. Shortness of breath. History of CHF. EXAM: PORTABLE CHEST 1 VIEW COMPARISON:  Chest radiograph July 28, 2014 FINDINGS: Stable moderate cardiomegaly. Pulmonary vascular congestion and mild interstitial prominence without pleural effusion or focal consolidation. No pneumothorax. Soft tissue planes and included osseous structures are  nonsuspicious. IMPRESSION: Similar moderate cardiomegaly and mild interstitial edema. Electronically Signed   By: Awilda Metro M.D.   On: 03/04/2018 17:12      IMPRESSION AND PLAN:   Acute respiratory failure with hypoxia due to sepsis and the possible pneumonia. The patient will be admitted to stepdown unit. Continue BiPAP, albuterol every 6 hours, start Zithromax and Rocephin, follow-up CBC and cultures.  Per Dr. Sung Amabile, the patient may have pneumonia but no CHF.  Sepsis. As above.  Pneumonia.  Continue Zithromax and Rocephin, follow-up cultures and CBC.  Check urine legionella and pneumonia antigen per Dr. Sung Amabile.  Acute renal failure.  Start normal saline IV and follow-up BMP.  OSA.  CPAP at night. Diabetes.  Start sliding scale. Hypertension.  Continue hypertension medication.  History of chronic CHF.  Unclear systolic or diastolic.   Discussed with Dr. Sung Amabile. All the records are reviewed and case discussed with ED provider. Management plans discussed with the patient, his wife and they are in agreement.  CODE STATUS: Full code  TOTAL CRITICAL TIME TAKING CARE OF THIS PATIENT: 58 minutes.    Shaune Pollack M.D on 03/04/2018 at 6:49 PM  Between 7am to 6pm - Pager - [phone]  After 6pm go to [email or web address] - Social research officer, government  [facility] [place] [facility]  Office  [phone]  CC: Primary care physician; [facility]   Note: This dictation was prepared with Dragon dictation  along with smaller phrase technology. Any transcriptional errors that result from this process are unin

## 2018-03-04 NOTE — Progress Notes (Signed)
CODE SEPSIS - PHARMACY COMMUNICATION  **Broad Spectrum Antibiotics should be administered within 1 hour of Sepsis diagnosis**  Time Code Sepsis Called/Page Received: 1820  Antibiotics Ordered: CTX  Time of 1st antibiotic administration: 1826  Additional action taken by pharmacy:   If necessary, Name of Provider/Nurse Contacted:     Valentina Gu ,PharmD Clinical Pharmacist  03/04/2018  6:27 PM

## 2018-03-04 NOTE — ED Provider Notes (Signed)
[facility] Emergency Department Provider Note  ____________________________________________  Time seen: Approximately 4:53 PM  I have reviewed the triage vital signs and the nursing notes.   HISTORY  Chief Complaint Shortness of Breath   HPI Charles Rangel is a 42 y.o. male history of OSA on CPAP, CHF, COPD, diabetes, hypertension who presents for evaluation of shortness of breath.  Patient reports 3 days of cough productive of yellow sputum, chills, progressively worsening shortness of breath which is now constant and severe.  He also reports 3 days of constant left-sided dull 6 out of 10 chest pain.  He went to the [place] yesterday and says that they did not do any blood work or chest x-ray.  They only did an EKG and told him he had a viral upper respiratory infection and send him home on prednisone and an inhaler.  Patient reports that today he was feeling markedly worse.  He denies fever, abdominal pain, nausea, vomiting, diarrhea.  Patient endorses compliance with his medications at home.  He is noted weight gain over the last few days and bilateral leg swelling.  Patient reports one prior history of DVT in the setting of a long trip which were several years ago.  He was on anticoagulation for 6 months however that was discontinued after that since it was thought to be a provoked DVT.  Past Medical History:  Diagnosis Date  . CHF (congestive heart failure) (HCC)   . Diabetes mellitus without complication (HCC)   . Hypertension      Prior to Admission medications   Medication Sig Start Date End Date Taking? Authorizing Provider  aspirin EC 81 MG tablet Take 81 mg daily by mouth.    [provider]  bumetanide (BUMEX) 1 MG tablet Take 2 mg 2 (two) times daily by mouth.     [provider]  CARVEDILOL PO Take 2 (two) times daily with a meal by mouth.     [provider]  lisinopril (PRINIVIL,ZESTRIL) 5 MG tablet Take 0.5 tablets by  mouth.     [provider]  naproxen (NAPROSYN) 500 MG tablet Take 1 tablet (500 mg total) 2 (two) times daily with a meal by mouth. 08/27/17   Sharman Cheek, MD    Allergies Tylenol [acetaminophen]  FH DM Heart failure COPD  Social History Social History   Tobacco Use  . Smoking status: Former Games developer  . Smokeless tobacco: Never Used  Substance Use Topics  . Alcohol use: Yes    Frequency: Never    Comment: rarely  . Drug use: No    Review of Systems  Constitutional: Negative for fever. + chills Eyes: Negative for visual changes. ENT: Negative for sore throat. Neck: No neck pain  Cardiovascular: + chest pain. Respiratory: + shortness of breath, cough Gastrointestinal: Negative for abdominal pain, vomiting or diarrhea. Genitourinary: Negative for dysuria. Musculoskeletal: Negative for back pain. Skin: Negative for rash. Neurological: Negative for headaches, weakness or numbness. Psych: No SI or HI  ____________________________________________   PHYSICAL EXAM:  VITAL SIGNS: ED Triage Vitals  Enc Vitals Group     BP --      Pulse Rate 03/04/18 1645 (!) 116     Resp 03/04/18 1645 (!) 26     Temp 03/04/18 1645 98.4 F (36.9 C)     Temp Source 03/04/18 1645 Oral     SpO2 --      Weight 03/04/18 1648 (!) 310 lb (140.6 kg)  Height 03/04/18 1648  (1.702 m)     Head Circumference --      Peak Flow --      Pain Score 03/04/18 1646 6     Pain Loc --      Pain Edu? --      Excl. in GC? --     Constitutional: Alert and oriented, moderate respiratory distress. HEENT:      Head: Normocephalic and atraumatic.         Eyes: Conjunctivae are normal. Sclera is non-icteric.       Mouth/Throat: Mucous membranes are moist.       Neck: Supple with no signs of meningismus. Cardiovascular: Tachycardic rate with regular rhythm. No murmurs, gallops, or rubs. 2+ symmetrical distal pulses are present in all extremities. No JVD. Respiratory: Increased work  of breathing, tachypneic, hypoxic to low 80s on room air, severely diminished air movement bilaterally with no wheezes or crackles  Gastrointestinal: Soft, non tender, and non distended with positive bowel sounds. No rebound or guarding. Musculoskeletal: 1+ pitting edema bilaterally  neurologic: Normal speech and language. Face is symmetric. Moving all extremities. No gross focal neurologic deficits are appreciated. Skin: Skin is warm, dry and intact. No rash noted. Psychiatric: Mood and affect are normal. Speech and behavior are normal.  ____________________________________________   LABS (all labs ordered are listed, but only abnormal results are displayed)  Labs Reviewed  BASIC METABOLIC PANEL - Abnormal; Notable for the following components:      Result Value   Chloride 92 (*)    Glucose, Bld 121 (*)    Creatinine, Ser 1.40 (*)    All other components within normal limits  CBC WITH DIFFERENTIAL/PLATELET - Abnormal; Notable for the following components:   WBC 11.0 (*)    MCV 78.8 (*)    MCH 25.9 (*)    RDW 18.6 (*)    Neutro Abs 8.6 (*)    Lymphs Abs 0.9 (*)    Monocytes Absolute 1.1 (*)    All other components within normal limits  CULTURE, BLOOD (ROUTINE X 2)  CULTURE, BLOOD (ROUTINE X 2)  TROPONIN I  LACTIC ACID, PLASMA  BRAIN NATRIURETIC PEPTIDE  BLOOD GAS, VENOUS   ____________________________________________  EKG  ED ECG REPORT I, Nita Sickle, the attending physician, personally viewed and interpreted this ECG.  Sinus tachycardia, rate of 110, normal intervals, normal axis, no ST elevations or depressions. ____________________________________________  RADIOLOGY  I have personally reviewed the images performed during this visit and I agree with the Radiologist's read.   Interpretation by Radiologist:  Dg Chest Portable 1 View  Result Date: 03/04/2018 CLINICAL DATA:  Diagnosed with upper respiratory tract infection yesterday. Shortness of breath.  History of CHF. EXAM: PORTABLE CHEST 1 VIEW COMPARISON:  Chest radiograph July 28, 2014 FINDINGS: Stable moderate cardiomegaly. Pulmonary vascular congestion and mild interstitial prominence without pleural effusion or focal consolidation. No pneumothorax. Soft tissue planes and included osseous structures are nonsuspicious. IMPRESSION: Similar moderate cardiomegaly and mild interstitial edema. Electronically Signed   By: Awilda Metro M.D.   On: 03/04/2018 17:12     ____________________________________________   PROCEDURES  Procedure(s) performed: None Procedures Critical Care performed: yes  CRITICAL CARE Performed by: Nita Sickle  ?  Total critical care time: 40 min  Critical care time was exclusive of separately billable procedures and treating other patients.  Critical care was necessary to treat or prevent imminent or life-threatening deterioration.  Critical care was time spent personally by me on  the following activities: development of treatment plan with patient and/or surrogate as well as nursing, discussions with consultants, evaluation of patient's response to treatment, examination of patient, obtaining history from patient or surrogate, ordering and performing treatments and interventions, ordering and review of laboratory studies, ordering and review of radiographic studies, pulse oximetry and re-evaluation of patient's condition.  ____________________________________________   INITIAL IMPRESSION / ASSESSMENT AND PLAN / ED COURSE  42 y.o. male history of OSA on CPAP, CHF, COPD, diabetes, hypertension who presents for evaluation of shortness of breath, productive cough, chills, leg swelling, chest pain.  Patient with a low-grade temp of 99.9, tachycardic, tachypneic, and hypoxic with severely diminished air movement.  Patient was put on 5 L nasal cannula and remain with oxygen saturations in the upper 80s.  Patient was placed on BiPAP.  Patient is going to be  started on 3 DuoNeb's and Solu-Medrol, chest x-ray pending to eval for pulmonary edema/pneumonia. Presentation is concerning for COPD vs bronchitis vs PNA vs CHF exacerbation.     _________________________ 6:20 PM on 03/04/2018 -----------------------------------------  Patient meets sepsis criteria, presentation concerning for acute respiratory failure in the setting of CHF exacerbation and COPD/bronchitis/pneumonia.  Patient was given Rocephin.  Since patient has pulmonary edema and is currently on BiPAP we will hold off on fluids.  Patient continues to be on BiPAP 40% satting in the mid 90s.  Will admit to the hospitalist service.  [facility]   As part of my medical decision making, I reviewed the following data within the electronic MEDICAL RECORD NUMBER Nursing notes reviewed and incorporated, Labs reviewed , EKG interpreted , Old chart reviewed, Radiograph reviewed , Discussed with admitting physician , Notes from prior ED visits and [place] Controlled Substance Database    Pertinent labs & imaging results that were available during my care of the patient were reviewed by me and considered in my medical decision making (see chart for details).    ____________________________________________   FINAL CLINICAL IMPRESSION(S) / ED DIAGNOSES  Final diagnoses:  Acute respiratory failure with hypoxia (HCC)  Acute on chronic congestive heart failure, unspecified heart failure type (HCC)  COPD exacerbation (HCC)  Sepsis, due to unspecified organism [facility])      NEW MEDICATIONS STARTED DURING THIS VISIT:  ED Discharge Orders    None       Note:  This document was prepared using Dragon voice recognition software and may include unintentional dictation errors.    Don Perking, [place], MD 03/04/18 Zollie Pee

## 2018-03-05 ENCOUNTER — Inpatient Hospital Stay (HOSPITAL_COMMUNITY)
Admit: 2018-03-05 | Discharge: 2018-03-05 | Disposition: A | Discharge status: Home / Self Care | Payer: Managed Care, Other (non HMO) | Attending: Adult Health | Admitting: Adult Health

## 2018-03-05 ENCOUNTER — Inpatient Hospital Stay: Payer: Managed Care, Other (non HMO)

## 2018-03-05 DIAGNOSIS — J9601 Acute respiratory failure with hypoxia: Secondary | ICD-10-CM

## 2018-03-05 LAB — BASIC METABOLIC PANEL
Anion gap: 11 (ref 5–15)
BUN: 22 mg/dL — AB (ref 6–20)
CO2: 31 mmol/L (ref 22–32)
CREATININE: 1.47 mg/dL — AB (ref 0.61–1.24)
Calcium: 9.3 mg/dL (ref 8.9–10.3)
Chloride: 96 mmol/L — ABNORMAL LOW (ref 101–111)
GFR calc Af Amer: >60 mL/min (ref >60)
GFR, EST NON AFRICAN AMERICAN: 57 mL/min — AB (ref >60)
GLUCOSE: 178 mg/dL — AB (ref 65–99)
POTASSIUM: 4.1 mmol/L (ref 3.5–5.1)
SODIUM: 138 mmol/L (ref 135–145)

## 2018-03-05 LAB — CBC
HEMATOCRIT: 44.1 % (ref 40.0–52.0)
Hemoglobin: 14.2 g/dL (ref 13.0–18.0)
MCH: 25.8 pg — ABNORMAL LOW (ref 26.0–34.0)
MCHC: 32.2 g/dL (ref 32.0–36.0)
MCV: 79.9 fL — AB (ref 80.0–100.0)
PLATELETS: 221 10*3/uL (ref 150–440)
RBC: 5.52 MIL/uL (ref 4.40–5.90)
RDW: 18.5 % — AB (ref 11.5–14.5)
WBC: 9.9 10*3/uL (ref 3.8–10.6)

## 2018-03-05 LAB — MAGNESIUM: Magnesium: 2.1 mg/dL (ref 1.7–2.4)

## 2018-03-05 LAB — ECHOCARDIOGRAM COMPLETE
HEIGHTINCHES: 67 in
Weight: 4960 oz

## 2018-03-05 LAB — GLUCOSE, CAPILLARY
GLUCOSE-CAPILLARY: 196 mg/dL — AB (ref 65–99)
Glucose-Capillary: 155 mg/dL — ABNORMAL HIGH (ref 65–99)
Glucose-Capillary: 211 mg/dL — ABNORMAL HIGH (ref 65–99)
Glucose-Capillary: 175 mg/dL — ABNORMAL HIGH (ref 65–99)

## 2018-03-05 LAB — PHOSPHORUS: Phosphorus: 4.4 mg/dL (ref 2.5–4.6)

## 2018-03-05 LAB — HEMOGLOBIN A1C
HEMOGLOBIN A1C: 5.1 % (ref 4.8–5.6)
MEAN PLASMA GLUCOSE: 100 mg/dL

## 2018-03-05 MED ORDER — INSULIN GLARGINE 100 UNIT/ML ~~LOC~~ SOLN
15.0000 [IU] | Freq: Every day | SUBCUTANEOUS | Status: DC
  Filled 2018-03-05: qty 0.15

## 2018-03-05 MED ORDER — FUROSEMIDE 10 MG/ML IJ SOLN
20.0000 mg | Freq: Two times a day (BID) | INTRAMUSCULAR | Status: DC
  Administered 2018-03-05 – 2018-03-07 (×5): 20 mg via INTRAVENOUS
  Filled 2018-03-05 (×5): qty 2

## 2018-03-05 MED ORDER — IPRATROPIUM-ALBUTEROL 0.5-2.5 (3) MG/3ML IN SOLN
3.0000 mL | RESPIRATORY_TRACT | Status: AC
  Administered 2018-03-05: 3 mL via RESPIRATORY_TRACT

## 2018-03-05 MED ORDER — IPRATROPIUM-ALBUTEROL 0.5-2.5 (3) MG/3ML IN SOLN
3.0000 mL | Freq: Four times a day (QID) | RESPIRATORY_TRACT | Status: DC | PRN
  Filled 2018-03-05: qty 3

## 2018-03-05 MED ORDER — VITAMIN D 1000 UNITS PO TABS
1000.0000 [IU] | ORAL_TABLET | Freq: Every day | ORAL | Status: DC
  Administered 2018-03-05: 1000 [IU] via ORAL
  Filled 2018-03-05 (×2): qty 1

## 2018-03-05 MED ORDER — CARVEDILOL 25 MG PO TABS
25.0000 mg | ORAL_TABLET | Freq: Two times a day (BID) | ORAL | Status: DC
  Administered 2018-03-05 – 2018-03-07 (×5): 25 mg via ORAL
  Filled 2018-03-05: qty 1
  Filled 2018-03-05: qty 2
  Filled 2018-03-05 (×3): qty 1

## 2018-03-05 MED ORDER — IPRATROPIUM-ALBUTEROL 0.5-2.5 (3) MG/3ML IN SOLN
RESPIRATORY_TRACT | Status: AC
  Administered 2018-03-05: 3 mL
  Filled 2018-03-05: qty 3

## 2018-03-05 MED ORDER — METHYLPREDNISOLONE SODIUM SUCC 125 MG IJ SOLR
60.0000 mg | Freq: Two times a day (BID) | INTRAMUSCULAR | Status: DC
  Administered 2018-03-05 – 2018-03-06 (×3): 60 mg via INTRAVENOUS
  Filled 2018-03-05 (×3): qty 2

## 2018-03-05 MED ORDER — ASPIRIN EC 81 MG PO TBEC
81.0000 mg | DELAYED_RELEASE_TABLET | Freq: Every day | ORAL | Status: DC
  Administered 2018-03-05 – 2018-03-07 (×3): 81 mg via ORAL
  Filled 2018-03-05 (×3): qty 1

## 2018-03-05 MED ORDER — CYCLOBENZAPRINE HCL 10 MG PO TABS
10.0000 mg | ORAL_TABLET | Freq: Three times a day (TID) | ORAL | Status: DC | PRN
  Filled 2018-03-05: qty 1

## 2018-03-05 MED ORDER — IPRATROPIUM-ALBUTEROL 0.5-2.5 (3) MG/3ML IN SOLN: 3.0000 mL | Freq: Four times a day (QID) | RESPIRATORY_TRACT | Status: DC

## 2018-03-05 MED ORDER — LISINOPRIL 5 MG PO TABS
12.5000 mg | ORAL_TABLET | Freq: Every day | ORAL | Status: DC
  Administered 2018-03-05 – 2018-03-07 (×3): 12.5 mg via ORAL
  Filled 2018-03-05: qty 3
  Filled 2018-03-05 (×2): qty 1

## 2018-03-05 MED ORDER — MORPHINE SULFATE (PF) 2 MG/ML IV SOLN: 2.0000 mg | INTRAVENOUS | Status: DC | PRN

## 2018-03-05 MED ORDER — IPRATROPIUM-ALBUTEROL 0.5-2.5 (3) MG/3ML IN SOLN
3.0000 mL | Freq: Four times a day (QID) | RESPIRATORY_TRACT | Status: DC
  Administered 2018-03-05 – 2018-03-07 (×8): 3 mL via RESPIRATORY_TRACT
  Filled 2018-03-05 (×9): qty 3

## 2018-03-05 NOTE — Progress Notes (Signed)
Pt transferred from [facility]. Pt wheezing up on assessment, Pt complaining sob,  Pt already received scheduled IV lasix and IV solumedrol.Dr. Imogene Burn notified and received orders for breathing treatment. Will administer and continue to monitor.

## 2018-03-05 NOTE — Progress Notes (Signed)
*  PRELIMINARY RESULTS* Echocardiogram 2D Echocardiogram has been performed.  Charles Rangel 03/05/2018, 10:54 AM

## 2018-03-05 NOTE — Care Management (Signed)
RNCM met with patient regarding Charles [facility]angel. He states he was just at [name] and they sent him home although he still felt bad. He decided to go to Urgent Care and they sent him to [facility].  He wishes to remain at [facility] under [employer].  His PCP is with [name]. I discussed potential difficulty [facility]. He hopes that he can be weaned from O2. He lives with his wife and another couple.  He gets his medications through  [facility] also without difficulty. He has transportation.  

## 2018-03-05 NOTE — Progress Notes (Signed)
Placed on auto cpap with o2. Tolerating well

## 2018-03-05 NOTE — Progress Notes (Signed)
[facility] - [place] at [facility]   PATIENT NAME: Charles Rangel    MR#:  [id number]  DATE OF BIRTH:  01-17-1976  SUBJECTIVE:   She presented with shortness of breath.  He is off BiPAP and on nasal cannula.  REVIEW OF SYSTEMS:    Review of Systems  Constitutional: Negative for fever, chills weight loss HENT: Negative for ear pain, nosebleeds, congestion, facial swelling, rhinorrhea, neck pain, neck stiffness and ear discharge.   Respiratory: Positive for cough, shortness of breath, wheezing  Cardiovascular: Negative for chest pain, palpitations and leg swelling.  Gastrointestinal: Negative for heartburn, abdominal pain, vomiting, diarrhea or consitpation Genitourinary: Negative for dysuria, urgency, frequency, hematuria Musculoskeletal: Negative for back pain or joint pain Neurological: Negative for dizziness, seizures, syncope, focal weakness,  numbness and headaches.  Hematological: Does not bruise/bleed easily.  Psychiatric/Behavioral: Negative for hallucinations, confusion, dysphoric mood    Tolerating Diet: yes      DRUG ALLERGIES:   Allergies  Allergen Reactions  . Tylenol [Acetaminophen]     VITALS:  Blood pressure (!) 141/94, pulse 100, temperature 97.8 F (36.6 C), temperature source Oral, resp. rate 15, height  (1.702 m), weight (!) 140.6 kg (310 lb), SpO2 95 %.  PHYSICAL EXAMINATION:  Constitutional: Appears well-developed and well-nourished. No distress. HENT: Normocephalic. Marland Kitchen Oropharynx is clear and moist.  Eyes: Conjunctivae and EOM are normal. PERRLA, no scleral icterus.  Neck: Normal ROM. Neck supple. No JVD. No tracheal deviation. CVS: RRR, S1/S2 +, no murmurs, no gallops, no carotid bruit.  Pulmonary: Normal respiratory effort with lower lobe crackles Abdominal: Soft. BS +,  no distension, tenderness, rebound or guarding.  Musculoskeletal: Normal range of motion. 1+ LEE and no tenderness.  Neuro: Alert. CN 2-12 grossly  intact. No focal deficits. Skin: Skin is warm and dry. No rash noted. Psychiatric: Normal mood and affect.      LABORATORY PANEL:   CBC Recent Labs  Lab 03/05/18 0500  WBC 9.9  HGB 14.2  HCT 44.1  PLT 221   ------------------------------------------------------------------------------------------------------------------  Chemistries  Recent Labs  Lab 03/05/18 0500  NA 138  K 4.1  CL 96*  CO2 31  GLUCOSE 178*  BUN 22*  CREATININE 1.47*  CALCIUM 9.3  MG 2.1   ------------------------------------------------------------------------------------------------------------------  Cardiac Enzymes Recent Labs  Lab 03/04/18 1653  TROPONINI <0.03   ------------------------------------------------------------------------------------------------------------------  RADIOLOGY:  Dg Chest Port 1 View  Result Date: 03/05/2018 CLINICAL DATA:  Acute respiratory failure EXAM: PORTABLE CHEST 1 VIEW COMPARISON:  Mar 04, 2018 FINDINGS: Stable cardiomegaly. Persistent but improved pulmonary edema. No other changes. IMPRESSION: Cardiomegaly and persistent but improving pulmonary edema. Electronically Signed   By: Gerome Sam III M.D   On: 03/05/2018 07:34   Dg Chest Portable 1 View  Result Date: 03/04/2018 CLINICAL DATA:  Diagnosed with upper respiratory tract infection yesterday. Shortness of breath. History of CHF. EXAM: PORTABLE CHEST 1 VIEW COMPARISON:  Chest radiograph July 28, 2014 FINDINGS: Stable moderate cardiomegaly. Pulmonary vascular congestion and mild interstitial prominence without pleural effusion or focal consolidation. No pneumothorax. Soft tissue planes and included osseous structures are nonsuspicious. IMPRESSION: Similar moderate cardiomegaly and mild interstitial edema. Electronically Signed   By: Awilda Metro M.D.   On: 03/04/2018 17:12     ASSESSMENT AND PLAN:    42 year old male with history of DVT, diabetes, COPD, CHF who presents with shortness of  breath.  1.  Acute hypoxic respiratory failure due to CHF exacerbation, community-acquired pneumonia and COPD exacerbation Patient weaned  off of BiPAP to nasal cannula  2.  Acute CHF exacerbation: Follow-up on echocardiogram to evaluate if this is diastolic or systolic in nature or both  Continue Lasix with daily weight and monitoring of intake and output  3.  Sepsis due to community-acquired pneumonia: Patient presents with tachycardia and tachypnea continue Rocephin and azithromycin  4.  Diabetes: Continue ADA diet with sliding scale  5.  Acute exacerbation of COPD due to pneumonia and CHF Wean IV steroids and continue nebs and inhalers  6.  Essential hypertension: Continue lisinopril     Management plans discussed with the patient and he is in agreement.  CODE STATUS: full  TOTAL TIME TAKING CARE OF THIS PATIENT: 30 minutes.     POSSIBLE D/C 1-2 days, DEPENDING ON CLINICAL CONDITION.   [name] M.D on 03/05/2018 at 11:25 AM  Between 7am to 6pm - Pager - [phone] After 6pm go to [email or web address] - password [email or web address]  [facility] [place] [facility]  Office  [phone]  CC: Primary care physician; [facility]  Note: This dictation was prepared with Dragon dictation along with smaller phrase technology. Any transcriptional errors that result from this process are unintentional.

## 2018-03-06 LAB — CBC
HEMATOCRIT: 43.6 % (ref 40.0–52.0)
Hemoglobin: 14.1 g/dL (ref 13.0–18.0)
MCH: 25.8 pg — ABNORMAL LOW (ref 26.0–34.0)
MCHC: 32.4 g/dL (ref 32.0–36.0)
MCV: 79.5 fL — AB (ref 80.0–100.0)
Platelets: 229 10*3/uL (ref 150–440)
RBC: 5.48 MIL/uL (ref 4.40–5.90)
RDW: 18.4 % — AB (ref 11.5–14.5)
WBC: 16.3 10*3/uL — AB (ref 3.8–10.6)

## 2018-03-06 LAB — BASIC METABOLIC PANEL
ANION GAP: 10 (ref 5–15)
BUN: 36 mg/dL — ABNORMAL HIGH (ref 6–20)
CALCIUM: 9.6 mg/dL (ref 8.9–10.3)
CO2: 31 mmol/L (ref 22–32)
Chloride: 97 mmol/L — ABNORMAL LOW (ref 101–111)
Creatinine, Ser: 1.4 mg/dL — ABNORMAL HIGH (ref 0.61–1.24)
Glucose, Bld: 201 mg/dL — ABNORMAL HIGH (ref 65–99)
POTASSIUM: 4.2 mmol/L (ref 3.5–5.1)
Sodium: 138 mmol/L (ref 135–145)

## 2018-03-06 LAB — GLUCOSE, CAPILLARY
GLUCOSE-CAPILLARY: 274 mg/dL — AB (ref 65–99)
GLUCOSE-CAPILLARY: 171 mg/dL — AB (ref 65–99)
Glucose-Capillary: 189 mg/dL — ABNORMAL HIGH (ref 65–99)
Glucose-Capillary: 244 mg/dL — ABNORMAL HIGH (ref 65–99)

## 2018-03-06 LAB — HIV ANTIBODY (ROUTINE TESTING W REFLEX): HIV Screen 4th Generation wRfx: NONREACTIVE

## 2018-03-06 MED ORDER — VITAMIN D3 25 MCG (1000 UNIT) PO TABS
1000.0000 [IU] | ORAL_TABLET | Freq: Every day | ORAL | Status: DC
  Administered 2018-03-06 – 2018-03-07 (×2): 1000 [IU] via ORAL
  Filled 2018-03-06 (×2): qty 1

## 2018-03-06 MED ORDER — METHYLPREDNISOLONE SODIUM SUCC 125 MG IJ SOLR
60.0000 mg | Freq: Every day | INTRAMUSCULAR | Status: DC
  Administered 2018-03-07: 60 mg via INTRAVENOUS
  Filled 2018-03-06: qty 2

## 2018-03-06 MED ORDER — BUDESONIDE 0.5 MG/2ML IN SUSP
0.5000 mg | Freq: Two times a day (BID) | RESPIRATORY_TRACT | Status: DC
  Administered 2018-03-06 – 2018-03-07 (×2): 0.5 mg via RESPIRATORY_TRACT
  Filled 2018-03-06 (×3): qty 2

## 2018-03-06 MED ORDER — AZITHROMYCIN 250 MG PO TABS
500.0000 mg | ORAL_TABLET | Freq: Every day | ORAL | Status: DC
  Administered 2018-03-06: 500 mg via ORAL
  Filled 2018-03-06: qty 2

## 2018-03-06 NOTE — Progress Notes (Signed)
SATURATION QUALIFICATIONS: (This note is used to comply with regulatory documentation for home oxygen)  Patient Saturations on Room Air at Rest = 93%  Patient Saturations on Room Air while Ambulating = 87%  Patient Saturations on 2 Liters of oxygen while Ambulating = 95%  Please briefly explain why patient needs home oxygen: 

## 2018-03-06 NOTE — Progress Notes (Signed)
[facility] - [place] at [facility]   PATIENT NAME: Charles Rangel    MR#:  [id number]  DATE OF BIRTH:  1976-08-27  SUBJECTIVE:   Patient here due to shortness of breath secondary to COPD/CHF.  Feels better since yesterday.  Weaned off BiPAP.  Still has some exertional dyspnea.  REVIEW OF SYSTEMS:    Review of Systems  Constitutional: Negative for chills and fever.  HENT: Negative for congestion and tinnitus.   Eyes: Negative for blurred vision and double vision.  Respiratory: Positive for shortness of breath and wheezing. Negative for cough.   Cardiovascular: Negative for chest pain, orthopnea and PND.  Gastrointestinal: Negative for abdominal pain, diarrhea, nausea and vomiting.  Genitourinary: Negative for dysuria and hematuria.  Neurological: Negative for dizziness, sensory change and focal weakness.  All other systems reviewed and are negative.   Nutrition: Heart healthy/Carb modified Tolerating Diet: Yes Tolerating PT: Ambulatory  DRUG ALLERGIES:   Allergies  Allergen Reactions  . Tylenol [Acetaminophen]     VITALS:  Blood pressure (!) 132/97, pulse 92, temperature (!) 97.5 F (36.4 C), temperature source Oral, resp. rate 18, height  (1.702 m), weight (!) 140.6 kg (310 lb), SpO2 92 %.  PHYSICAL EXAMINATION:   Physical Exam  GENERAL:  42 y.o.-year-old patient lying in bed in no acute distress.  EYES: Pupils equal, round, reactive to light and accommodation. No scleral icterus. Extraocular muscles intact.  HEENT: Head atraumatic, normocephalic. Oropharynx and nasopharynx clear.  NECK:  Supple, no jugular venous distention. No thyroid enlargement, no tenderness.  LUNGS: Good air entry bilaterally, minimal end expiratory wheezing bilaterally, minimal rales, No rhonchi.  No use of accessory muscles of respiration.  CARDIOVASCULAR: S1, S2 normal. No murmurs, rubs, or gallops.  ABDOMEN: Soft, nontender, nondistended. Bowel sounds present. No  organomegaly or mass.  EXTREMITIES: No cyanosis, clubbing, Trace edema b/l.    NEUROLOGIC: Cranial nerves II through XII are intact. No focal Motor or sensory deficits b/l.   PSYCHIATRIC: The patient is alert and oriented x 3.  SKIN: No obvious rash, lesion, or ulcer.    LABORATORY PANEL:   CBC Recent Labs  Lab 03/06/18 0618  WBC 16.3*  HGB 14.1  HCT 43.6  PLT 229   ------------------------------------------------------------------------------------------------------------------  Chemistries  Recent Labs  Lab 03/05/18 0500 03/06/18 0618  NA 138 138  K 4.1 4.2  CL 96* 97*  CO2 31 31  GLUCOSE 178* 201*  BUN 22* 36*  CREATININE 1.47* 1.40*  CALCIUM 9.3 9.6  MG 2.1  --    ------------------------------------------------------------------------------------------------------------------  Cardiac Enzymes Recent Labs  Lab 03/04/18 1653  TROPONINI <0.03   ------------------------------------------------------------------------------------------------------------------  RADIOLOGY:  Dg Chest Port 1 View  Result Date: 03/05/2018 CLINICAL DATA:  Acute respiratory failure EXAM: PORTABLE CHEST 1 VIEW COMPARISON:  Mar 04, 2018 FINDINGS: Stable cardiomegaly. Persistent but improved pulmonary edema. No other changes. IMPRESSION: Cardiomegaly and persistent but improving pulmonary edema. Electronically Signed   By: Gerome Sam III M.D   On: 03/05/2018 07:34   Dg Chest Portable 1 View  Result Date: 03/04/2018 CLINICAL DATA:  Diagnosed with upper respiratory tract infection yesterday. Shortness of breath. History of CHF. EXAM: PORTABLE CHEST 1 VIEW COMPARISON:  Chest radiograph July 28, 2014 FINDINGS: Stable moderate cardiomegaly. Pulmonary vascular congestion and mild interstitial prominence without pleural effusion or focal consolidation. No pneumothorax. Soft tissue planes and included osseous structures are nonsuspicious. IMPRESSION: Similar moderate cardiomegaly and mild  interstitial edema. Electronically Signed   By: Awilda Metro  M.D.   On: 03/04/2018 17:12     ASSESSMENT AND PLAN:   42 year old male with past medical history of obstructive sleep apnea, hypertension, diabetes, COPD, chronic diastolic CHF who presented to the hospital due to shortness of breath and noted to be in acute respiratory failure with hypoxia.  1.  Acute respiratory failure with hypoxia-secondary to underlying COPD exacerbation also mild CHF. -Improving, off BiPAP.  Continue diuresis with IV Lasix, continue IV steroids but will taper.  Continue duo nebs, Pulmicort nebs.  Assess the patient for home oxygen prior to discharge.  2.  CHF - acute on chronic Diastolic dysfunction.  - cont. IV lasix and improving.  - cont. Coreg, Lisinopril.   3. COPD Exacerbation - improving. Cont. IV steroids but will taper.  - cont. Duonebs, will add Pulmicort nebs.  - cont. Anti-tussives.  Improving.  - cont. Empiric abx with Ceftriaxone, Zithromax.   4. DM Type II w/out complication - cont. SSI and follow BS   All the records are reviewed and case discussed with Care Management/Social Worker. Management plans discussed with the patient, family and they are in agreement.  CODE STATUS: Full code  DVT Prophylaxis: hep SQ   TOTAL TIME TAKING CARE OF THIS PATIENT: 30 minutes.   POSSIBLE D/C IN 1-2 DAYS, DEPENDING ON CLINICAL CONDITION.   Houston Siren M.D on 03/06/2018 at 2:21 PM  Between 7am to 6pm - Pager - [phone]  After 6pm go to [email or web address] - Scientist, research (life sciences) [facility]  Office  [phone]  CC: Primary care physician; [facility]

## 2018-03-06 NOTE — Plan of Care (Addendum)
Ambulated patient around nurses station and o2 dropped to 87% on R/A. Placed patient on 1L N/C . Pt sob on exertion. Will continue to monitor. Problem: Education: Goal: Knowledge of General Education information will improve Outcome: Progressing   Problem: Health Behavior/Discharge Planning: Goal: Ability to manage health-related needs will improve Outcome: Progressing   Problem: Clinical Measurements: Goal: Will remain free from infection Outcome: Progressing

## 2018-03-06 NOTE — Progress Notes (Signed)
PHARMACIST - PHYSICIAN COMMUNICATION DR:   Sainani CONCERNING: Antibiotic IV to Oral Route Change Policy  RECOMMENDATION: This patient is receiving azithromycin by the intravenous route.  Based on criteria approved by the Pharmacy and Therapeutics Committee, the antibiotic(s) is/are being converted to the equivalent oral dose form(s).   DESCRIPTION: These criteria include:  Patient being treated for a respiratory tract infection, urinary tract infection, cellulitis or clostridium difficile associated diarrhea if on metronidazole  The patient is not neutropenic and does not exhibit a GI malabsorption state  The patient is eating (either orally or via tube) and/or has been taking other orally administered medications for a least 24 hours  The patient is improving clinically and has a Tmax < 100.5  If you have questions about this conversion, please contact the Pharmacy Department  []  ( [phone] )  [place] [x]  ( [phone] )  [facility] []  ( [phone]  [place] []  ( [phone]  [facility] []  ( [phone] )  [facility]   

## 2018-03-07 ENCOUNTER — Inpatient Hospital Stay: Payer: Managed Care, Other (non HMO)

## 2018-03-07 LAB — BASIC METABOLIC PANEL
Anion gap: 8 (ref 5–15)
BUN: 41 mg/dL — AB (ref 6–20)
CHLORIDE: 98 mmol/L — AB (ref 101–111)
CO2: 32 mmol/L (ref 22–32)
Calcium: 9.5 mg/dL (ref 8.9–10.3)
Creatinine, Ser: 1.43 mg/dL — ABNORMAL HIGH (ref 0.61–1.24)
GFR calc Af Amer: >60 mL/min (ref >60)
GFR, EST NON AFRICAN AMERICAN: 59 mL/min — AB (ref >60)
Glucose, Bld: 120 mg/dL — ABNORMAL HIGH (ref 65–99)
POTASSIUM: 4.1 mmol/L (ref 3.5–5.1)
Sodium: 138 mmol/L (ref 135–145)

## 2018-03-07 LAB — CBC
HCT: 44.7 % (ref 40.0–52.0)
Hemoglobin: 14.5 g/dL (ref 13.0–18.0)
MCH: 25.8 pg — AB (ref 26.0–34.0)
MCHC: 32.4 g/dL (ref 32.0–36.0)
MCV: 79.7 fL — AB (ref 80.0–100.0)
PLATELETS: 239 10*3/uL (ref 150–440)
RBC: 5.62 MIL/uL (ref 4.40–5.90)
RDW: 18.3 % — ABNORMAL HIGH (ref 11.5–14.5)
WBC: 12.8 10*3/uL — ABNORMAL HIGH (ref 3.8–10.6)

## 2018-03-07 LAB — GLUCOSE, CAPILLARY
GLUCOSE-CAPILLARY: 237 mg/dL — AB (ref 65–99)
Glucose-Capillary: 112 mg/dL — ABNORMAL HIGH (ref 65–99)

## 2018-03-07 MED ORDER — DOXYCYCLINE HYCLATE 100 MG PO CAPS: 100.0000 mg | ORAL_CAPSULE | Freq: Two times a day (BID) | ORAL | 0 refills | Status: AC

## 2018-03-07 MED ORDER — FUROSEMIDE 20 MG PO TABS: 20.0000 mg | ORAL_TABLET | Freq: Two times a day (BID) | ORAL | 1 refills | Status: DC

## 2018-03-07 MED ORDER — PREDNISONE 10 MG PO TABS: ORAL_TABLET | ORAL | 0 refills | Status: DC

## 2018-03-07 NOTE — Progress Notes (Signed)
Charles Rangel to be D/C'd Home per MD order.  Discussed prescriptions and follow up appointments with the patient. Prescriptions given to patient, medication list explained in detail. Pt verbalized understanding.  Allergies as of 03/07/2018      Reactions   Tylenol [acetaminophen]       Medication List    STOP taking these medications   bumetanide 2 MG tablet Commonly known as:  BUMEX     TAKE these medications   aspirin EC 81 MG tablet Take 81 mg daily by mouth.   carvedilol 25 MG tablet Commonly known as:  COREG Take 1 tablet by mouth 2 (two) times daily with a meal.   cyclobenzaprine 10 MG tablet Commonly known as:  FLEXERIL Take 1 tablet by mouth 3 (three) times daily as needed.   doxycycline 100 MG capsule Commonly known as:  VIBRAMYCIN Take 1 capsule (100 mg total) by mouth 2 (two) times daily for 5 days.   furosemide 20 MG tablet Commonly known as:  LASIX Take 1 tablet (20 mg total) by mouth 2 (two) times daily.   lisinopril 5 MG tablet Commonly known as:  PRINIVIL,ZESTRIL Take 2.5 tablets by mouth.   metFORMIN 1000 MG tablet Commonly known as:  GLUCOPHAGE Take 1,000 mg by mouth 2 (two) times daily with a meal.   predniSONE 10 MG tablet Commonly known as:  DELTASONE Label  & dispense according to the schedule below. 5 Pills PO for 1 day then, 4 Pills PO for 1 day, 3 Pills PO for 1 day, 2 Pills PO for 1 day, 1 Pill PO for 1 days then STOP.   Vitamin D3 1000 units Caps Take 1 capsule by mouth daily.       Vitals:   03/07/18 0536 03/07/18 0802  BP: (!) 125/97 124/88  Pulse: 69 85  Resp: 18 18  Temp: 98.7 F (37.1 C) 98.6 F (37 C)  SpO2: 100% 95%    Tele box removed and returned.Skin clean, dry and intact without evidence of skin break down, no evidence of skin tears noted. IV catheter discontinued intact. Site without signs and symptoms of complications. Dressing and pressure applied. Pt denies pain at this time. No complaints noted.  An After  Visit Summary was printed and given to the patient. Patient escorted via WC, and D/C home via private auto.  Charles Rangel

## 2018-03-07 NOTE — Progress Notes (Signed)
Patient was able to walk to the nurses station at 8:00pm, 02 sat dropped to 88% on R/A. Patient was having extreme SOB so he was walked back to his room.  Patient's 02 sat  98% on R/A at rest. Complaint with his CPAP. Will continue to monitor.

## 2018-03-07 NOTE — Discharge Summary (Signed)
[facility] -  at [facility]   PATIENT NAME: Charles Rangel    MR#:  [id number]  DATE OF BIRTH:  1976/05/16  DATE OF ADMISSION:  03/04/2018 ADMITTING PHYSICIAN: Shaune Pollack, MD  DATE OF DISCHARGE: 03/07/2018  PRIMARY CARE PHYSICIAN: [facility]    ADMISSION DIAGNOSIS:  COPD exacerbation (HCC) [J44.1] Acute respiratory failure with hypoxia (HCC) [J96.01] Sepsis, due to unspecified organism (HCC) [A41.9] Acute on chronic congestive heart failure, unspecified heart failure type (HCC) [I50.9]  DISCHARGE DIAGNOSIS:  Active Problems:   Acute respiratory failure with hypoxia (HCC)   SECONDARY DIAGNOSIS:   Past Medical History:  Diagnosis Date  . CHF (congestive heart failure) (HCC)   . COPD (chronic obstructive pulmonary disease) (HCC)   . Diabetes mellitus without complication (HCC)   . DVT (deep vein thrombosis) in pregnancy (HCC)   . HTN (hypertension)   . Hypertension   . OSA (obstructive sleep apnea)     HOSPITAL COURSE:   42 year old male with past medical history of obstructive sleep apnea, hypertension, diabetes, COPD, chronic diastolic CHF who presented to the hospital due to shortness of breath and noted to be in acute respiratory failure with hypoxia.  1.  Acute respiratory failure with hypoxia-secondary to underlying COPD exacerbation also mild CHF. -Patient was treated with IV Lasix, given IV steroids and also given scheduled duo nebs and Pulmicort nebs. -he has significantly improved since admission.  He was ambulated and is no longer hypoxic.  He is therefore being discharged home. -he will now be discharged on oral prednisone taper, empiric Levaquin and oral diuretics.  2.  CHF - acute on chronic Diastolic dysfunction.  -Patient was diuresed with IV Lasix and has significantly improved since admission.  About 3 L negative since admission. - He is now being discharged on oral Lasix and has been taken off his Bumex. -He will  continue his Coreg, Lisinopril.   3. COPD Exacerbation -treated with IV steroids, scheduled duo nebs, Pulmicort nebs.  His wheezing, bronchospasm has significantly improved.  His chest x-ray was negative for pneumonia but he was empirically treated with ceftriaxone, Zithromax. - He is now being discharged on oral prednisone taper and empiric Levaquin for additional 5 days.   4. DM Type II w/out complication -on the hospital patient was on sliding scale insulin, but will resume his metformin upon discharge.   DISCHARGE CONDITIONS:   Stable  CONSULTS OBTAINED:    DRUG ALLERGIES:   Allergies  Allergen Reactions  . Tylenol [Acetaminophen]     DISCHARGE MEDICATIONS:   Allergies as of 03/07/2018      Reactions   Tylenol [acetaminophen]       Medication List    STOP taking these medications   bumetanide 2 MG tablet Commonly known as:  BUMEX     TAKE these medications   aspirin EC 81 MG tablet Take 81 mg daily by mouth.   carvedilol 25 MG tablet Commonly known as:  COREG Take 1 tablet by mouth 2 (two) times daily with a meal.   cyclobenzaprine 10 MG tablet Commonly known as:  FLEXERIL Take 1 tablet by mouth 3 (three) times daily as needed.   doxycycline 100 MG capsule Commonly known as:  VIBRAMYCIN Take 1 capsule (100 mg total) by mouth 2 (two) times daily for 5 days.   furosemide 20 MG tablet Commonly known as:  LASIX Take 1 tablet (20 mg total) by mouth 2 (two) times daily.   lisinopril 5 MG tablet Commonly known  as:  PRINIVIL,ZESTRIL Take 2.5 tablets by mouth.   metFORMIN 1000 MG tablet Commonly known as:  GLUCOPHAGE Take 1,000 mg by mouth 2 (two) times daily with a meal.   predniSONE 10 MG tablet Commonly known as:  DELTASONE Label  & dispense according to the schedule below. 5 Pills PO for 1 day then, 4 Pills PO for 1 day, 3 Pills PO for 1 day, 2 Pills PO for 1 day, 1 Pill PO for 1 days then STOP.   Vitamin D3 1000 units Caps Take 1 capsule by mouth  daily.         DISCHARGE INSTRUCTIONS:   DIET:  Cardiac diet and Diabetic diet  DISCHARGE CONDITION:  Stable  ACTIVITY:  Activity as tolerated  OXYGEN:  Home Oxygen: No.   Oxygen Delivery: room air  DISCHARGE LOCATION:  home   If you experience worsening of your admission symptoms, develop shortness of breath, life threatening emergency, suicidal or homicidal thoughts you must seek medical attention immediately by calling 911 or calling your MD immediately  if symptoms less severe.  You Must read complete instructions/literature along with all the possible adverse reactions/side effects for all the Medicines you take and that have been prescribed to you. Take any new Medicines after you have completely understood and accpet all the possible adverse reactions/side effects.   Please note  You were cared for by a hospitalist during your hospital stay. If you have any questions about your discharge medications or the care you received while you were in the hospital after you are discharged, you can call the unit and asked to speak with the hospitalist on call if the hospitalist that took care of you is not available. Once you are discharged, your primary care physician will handle any further medical issues. Please note that NO REFILLS for any discharge medications will be authorized once you are discharged, as it is imperative that you return to your primary care physician (or establish a relationship with a primary care physician if you do not have one) for your aftercare needs so that they can reassess your need for medications and monitor your lab values.     Today   Shortness of breath much improved.  Not hypoxic on ambulation.  No wheezing, bronchospasm.  VITAL SIGNS:  Blood pressure 124/88, pulse 85, temperature 98.6 F (37 C), temperature source Oral, resp. rate 18, height  (1.702 m), weight (!) 140.6 kg (310 lb), SpO2 95 %.  I/O:    Intake/Output Summary  (Last 24 hours) at 03/07/2018 1338 Last data filed at 03/07/2018 0955 Gross per 24 hour  Intake 340 ml  Output 2050 ml  Net -1710 ml    PHYSICAL EXAMINATION:   GENERAL:  42 y.o.-year-old morbidly obese patient sitting up in chair in no acute distress.  EYES: Pupils equal, round, reactive to light and accommodation. No scleral icterus. Extraocular muscles intact.  HEENT: Head atraumatic, normocephalic. Oropharynx and nasopharynx clear.  NECK:  Supple, no jugular venous distention. No thyroid enlargement, no tenderness.  LUNGS: Good air entry bilaterally, no wheezing, rales, No rhonchi.  No use of accessory muscles of respiration.  CARDIOVASCULAR: S1, S2 normal. No murmurs, rubs, or gallops.  ABDOMEN: Soft, nontender, nondistended. Bowel sounds present. No organomegaly or mass.  EXTREMITIES: No cyanosis, clubbing, Trace edema b/l.    NEUROLOGIC: Cranial nerves II through XII are intact. No focal Motor or sensory deficits b/l.   PSYCHIATRIC: The patient is alert and oriented x 3.  SKIN:  No obvious rash, lesion, or ulcer.   DATA REVIEW:   CBC Recent Labs  Lab 03/07/18 0529  WBC 12.8*  HGB 14.5  HCT 44.7  PLT 239    Chemistries  Recent Labs  Lab 03/05/18 0500  03/07/18 0529  NA 138   < > 138  K 4.1   < > 4.1  CL 96*   < > 98*  CO2 31   < > 32  GLUCOSE 178*   < > 120*  BUN 22*   < > 41*  CREATININE 1.47*   < > 1.43*  CALCIUM 9.3   < > 9.5  MG 2.1  --   --    < > = values in this interval not displayed.    Cardiac Enzymes Recent Labs  Lab 03/04/18 1653  TROPONINI <0.03    Microbiology Results  Results for orders placed or performed during the hospital encounter of 03/04/18  Blood culture (routine x 2)     Status: None (Preliminary result)   Collection Time: 03/04/18  4:54 PM  Result Value Ref Range Status   Specimen Description BLOOD BLOOD RIGHT HAND  Final   Special Requests Blood Culture adequate volume  Final   Culture   Final    NO GROWTH 3  DAYS Performed at [facility], [address] [place], [place] [address]    Report Status PENDING  Incomplete  Blood culture (routine x 2)     Status: None (Preliminary result)   Collection Time: 03/04/18  4:59 PM  Result Value Ref Range Status   Specimen Description BLOOD BLOOD LEFT ARM  Final   Special Requests Blood Culture adequate volume  Final   Culture   Final    NO GROWTH 3 DAYS Performed at [facility], [address], [place], [place] [address]    Report Status PENDING  Incomplete  MRSA PCR Screening     Status: None   Collection Time: 03/04/18  8:02 PM  Result Value Ref Range Status   MRSA by PCR NEGATIVE NEGATIVE Final    Comment:        The GeneXpert MRSA Assay (FDA approved for NASAL specimens only), is one component of a comprehensive MRSA colonization surveillance program. It is not intended to diagnose MRSA infection nor to guide or monitor treatment for MRSA infections. Performed at [facility], [address] [place], [place] [address]     RADIOLOGY:  Dg Chest Port 1 View  Result Date: 03/07/2018 CLINICAL DATA:  Shortness of breath improving over the past 2 days. EXAM: PORTABLE CHEST 1 VIEW COMPARISON:  03/05/2018 FINDINGS: Lungs are adequately inflated and demonstrate hazy prominence of the perihilar markings slightly improved. Stable cardiomegaly. Remainder the exam is unchanged. IMPRESSION: Stable cardiomegaly with findings suggesting improving mild interstitial edema. Electronically Signed   By: Elberta Fortis M.D.   On: 03/07/2018 09:04      Management plans discussed with the patient, family and they are in agreement.  CODE STATUS:     Code Status Orders  (From admission, onward)        Start     Ordered   03/04/18 1928  Full code  Continuous     03/04/18 1928    Code Status History    This patient has a current code status but no historical code status.      TOTAL TIME TAKING CARE OF THIS PATIENT:  40 minutes.    [place] Siren M.D on 03/07/2018  at 1:38 PM  Between 7am to 6pm - Pager - [phone]  After 6pm go to [email or web address] - Scientist, research (life sciences) [place] [facility]  Office  [phone]  CC: Primary care physician; [facility]

## 2018-03-07 NOTE — Progress Notes (Signed)
[facility]    Charles Rangel was admitted to the Hospital on 03/04/2018 and Discharged  03/07/2018 and should be excused from work/school   for 7 days starting 03/04/2018 , may return to work/school without any restrictions.  Please Call Hilda Lias MD, [facility]  [phone] with questions.  Houston Siren M.D on 03/07/2018,at 11:20 AM

## 2018-03-07 NOTE — Progress Notes (Signed)
SATURATION QUALIFICATIONS: (This note is used to comply with regulatory documentation for home oxygen)  Patient Saturations on Room Air at Rest = 96%  Patient Saturations on Room Air while Ambulating = 91%  Patient Saturations on n/a Liters of oxygen while Ambulating = n/a%  Please briefly explain why patient needs home oxygen: 

## 2018-03-09 LAB — CULTURE, BLOOD (ROUTINE X 2)
CULTURE: NO GROWTH
Culture: NO GROWTH
Special Requests: ADEQUATE
Special Requests: ADEQUATE

## 2018-03-10 ENCOUNTER — Telehealth: Payer: Self-pay

## 2018-03-10 NOTE — Telephone Encounter (Signed)
Flagged on EMMI report for not reading discharge papers.  First attempt to reach patient made 03/10/18 at 11:55am, however unable to reach patient.  Left voicemail encouraging callback. Will attempt at later time.

## 2018-03-11 NOTE — Telephone Encounter (Signed)
Second attempt to reach patient made 03/11/18 at 2:08pm, however unable to reach patient.  Left another voicemail encouraging callback.

## 2018-03-29 ENCOUNTER — Other Ambulatory Visit: Payer: Self-pay

## 2018-03-29 ENCOUNTER — Emergency Department: Payer: Managed Care, Other (non HMO)

## 2018-03-29 ENCOUNTER — Encounter: Payer: Self-pay | Admitting: Emergency Medicine

## 2018-03-29 ENCOUNTER — Emergency Department
Admission: EM | Admit: 2018-03-29 | Discharge: 2018-03-29 | Disposition: A | Discharge status: Home / Self Care | Payer: Managed Care, Other (non HMO) | Attending: Emergency Medicine | Admitting: Emergency Medicine

## 2018-03-29 DIAGNOSIS — Z7984 Long term (current) use of oral hypoglycemic drugs: Secondary | ICD-10-CM | POA: Insufficient documentation

## 2018-03-29 DIAGNOSIS — I110 Hypertensive heart disease with heart failure: Secondary | ICD-10-CM | POA: Insufficient documentation

## 2018-03-29 DIAGNOSIS — J449 Chronic obstructive pulmonary disease, unspecified: Secondary | ICD-10-CM | POA: Diagnosis not present

## 2018-03-29 DIAGNOSIS — R2243 Localized swelling, mass and lump, lower limb, bilateral: Secondary | ICD-10-CM | POA: Diagnosis present

## 2018-03-29 DIAGNOSIS — Z7982 Long term (current) use of aspirin: Secondary | ICD-10-CM | POA: Insufficient documentation

## 2018-03-29 DIAGNOSIS — Z87891 Personal history of nicotine dependence: Secondary | ICD-10-CM | POA: Diagnosis not present

## 2018-03-29 DIAGNOSIS — M7989 Other specified soft tissue disorders: Secondary | ICD-10-CM

## 2018-03-29 DIAGNOSIS — Z79899 Other long term (current) drug therapy: Secondary | ICD-10-CM | POA: Diagnosis not present

## 2018-03-29 DIAGNOSIS — E119 Type 2 diabetes mellitus without complications: Secondary | ICD-10-CM | POA: Insufficient documentation

## 2018-03-29 DIAGNOSIS — I509 Heart failure, unspecified: Secondary | ICD-10-CM | POA: Insufficient documentation

## 2018-03-29 MED ORDER — TRAMADOL HCL 50 MG PO TABS: 50.0000 mg | ORAL_TABLET | Freq: Four times a day (QID) | ORAL | 0 refills | Status: DC | PRN

## 2018-03-29 NOTE — Discharge Instructions (Signed)
Fortunately today the ultrasound of your leg was very reassuring.  This test is not perfect and it does not mean with certainty that you do not have a blood clot.  Please get another ultrasound in 1 week.  If your primary care physician is unable to perform this ultrasound we are more than happy to do it in our ER.  Return to the emergency department sooner for any concerns whatsoever.  It was a pleasure to take care of you today, and thank you for coming to our emergency department.  If you have any questions or concerns before leaving please ask the nurse to grab me and I'm more than happy to go through your aftercare instructions again.  If you were prescribed any opioid pain medication today such as Norco, Vicodin, Percocet, morphine, hydrocodone, or oxycodone please make sure you do not drive when you are taking this medication as it can alter your ability to drive safely.  If you have any concerns once you are home that you are not improving or are in fact getting worse before you can make it to your follow-up appointment, please do not hesitate to call 911 and come back for further evaluation.  Merrily Brittle, MD  Results for orders placed or performed during the hospital encounter of 03/04/18  Blood culture (routine x 2)  Result Value Ref Range   Specimen Description BLOOD BLOOD RIGHT HAND    Special Requests Blood Culture adequate volume    Culture      NO GROWTH 5 DAYS Performed at [facility], [address] [place], [place] [address]    Report Status 03/09/2018 FINAL   Blood culture (routine x 2)  Result Value Ref Range   Specimen Description BLOOD BLOOD LEFT ARM    Special Requests Blood Culture adequate volume    Culture      NO GROWTH 5 DAYS Performed at [facility], [address] [place], [place] [address]    Report Status 03/09/2018 FINAL   MRSA PCR Screening  Result Value Ref Range   MRSA by PCR NEGATIVE NEGATIVE  Troponin I  Result Value  Ref Range   Troponin I <0.03 <0.03 ng/mL  Brain natriuretic peptide  Result Value Ref Range   B Natriuretic Peptide 47.0 0.0 - 100.0 pg/mL  Basic metabolic panel  Result Value Ref Range   Sodium 136 135 - 145 mmol/L   Potassium 3.7 3.5 - 5.1 mmol/L   Chloride 92 (L) 101 - 111 mmol/L   CO2 30 22 - 32 mmol/L   Glucose, Bld 121 (H) 65 - 99 mg/dL   BUN 20 6 - 20 mg/dL   Creatinine, Ser 3.24 (H) 0.61 - 1.24 mg/dL   Calcium 9.8 8.9 - 40.1 mg/dL   GFR calc non Af Amer >60 >60 mL/min   GFR calc Af Amer >60 >60 mL/min   Anion gap 14 5 - 15  CBC with Differential/Platelet  Result Value Ref Range   WBC 11.0 (H) 3.8 - 10.6 K/uL   RBC 5.30 4.40 - 5.90 MIL/uL   Hemoglobin 13.7 13.0 - 18.0 g/dL   HCT 02.7 25.3 - 66.4 %   MCV 78.8 (L) 80.0 - 100.0 fL   MCH 25.9 (L) 26.0 - 34.0 pg   MCHC 32.8 32.0 - 36.0 g/dL   RDW 40.3 (H) 47.4 - 25.9 %   Platelets 213 150 - 440 K/uL   Neutrophils Relative % 78 %   Neutro Abs 8.6 (H) 1.4 -  6.5 K/uL   Lymphocytes Relative 8 %   Lymphs Abs 0.9 (L) 1.0 - 3.6 K/uL   Monocytes Relative 10 %   Monocytes Absolute 1.1 (H) 0.2 - 1.0 K/uL   Eosinophils Relative 4 %   Eosinophils Absolute 0.5 0 - 0.7 K/uL   Basophils Relative 0 %   Basophils Absolute 0.0 0 - 0.1 K/uL  Blood gas, venous  Result Value Ref Range   pH, Ven 7.41 7.250 - 7.430   pCO2, Ven 61 (H) 44.0 - 60.0 mmHg   pO2, Ven 72.0 (H) 32.0 - 45.0 mmHg   Bicarbonate 38.7 (H) 20.0 - 28.0 mmol/L   Acid-Base Excess 11.0 (H) 0.0 - 2.0 mmol/L   O2 Saturation 94.4 %   Patient temperature 37.0    Collection site VEIN    Sample type VENIPUNCTURE   Lactic acid, plasma  Result Value Ref Range   Lactic Acid, Venous 0.9 0.5 - 1.9 mmol/L  HIV antibody (Routine Testing)  Result Value Ref Range   HIV Screen 4th Generation wRfx Non Reactive Non Reactive  Basic metabolic panel  Result Value Ref Range   Sodium 138 135 - 145 mmol/L   Potassium 4.1 3.5 - 5.1 mmol/L   Chloride 96 (L) 101 - 111 mmol/L   CO2 31 22  - 32 mmol/L   Glucose, Bld 178 (H) 65 - 99 mg/dL   BUN 22 (H) 6 - 20 mg/dL   Creatinine, Ser 1.61 (H) 0.61 - 1.24 mg/dL   Calcium 9.3 8.9 - 09.6 mg/dL   GFR calc non Af Amer 57 (L) >60 mL/min   GFR calc Af Amer >60 >60 mL/min   Anion gap 11 5 - 15  CBC  Result Value Ref Range   WBC 9.9 3.8 - 10.6 K/uL   RBC 5.52 4.40 - 5.90 MIL/uL   Hemoglobin 14.2 13.0 - 18.0 g/dL   HCT 04.5 40.9 - 81.1 %   MCV 79.9 (L) 80.0 - 100.0 fL   MCH 25.8 (L) 26.0 - 34.0 pg   MCHC 32.2 32.0 - 36.0 g/dL   RDW 91.4 (H) 78.2 - 95.6 %   Platelets 221 150 - 440 K/uL  Magnesium  Result Value Ref Range   Magnesium 1.9 1.7 - 2.4 mg/dL  Procalcitonin  Result Value Ref Range   Procalcitonin 0.19 ng/mL  Blood gas, arterial  Result Value Ref Range   FIO2 0.36    Delivery systems NASAL CANNULA    pH, Arterial 7.35 7.350 - 7.450   pCO2 arterial 63 (H) 32.0 - 48.0 mmHg   pO2, Arterial 59 (L) 83.0 - 108.0 mmHg   Bicarbonate 34.8 (H) 20.0 - 28.0 mmol/L   Acid-Base Excess 7.0 (H) 0.0 - 2.0 mmol/L   O2 Saturation 88.7 %   Patient temperature 37.0    Collection site LEFT RADIAL    Sample type ARTERIAL DRAW    Allens test (pass/fail) PASS PASS  Glucose, capillary  Result Value Ref Range   Glucose-Capillary 153 (H) 65 - 99 mg/dL  Magnesium  Result Value Ref Range   Magnesium 2.1 1.7 - 2.4 mg/dL  Phosphorus  Result Value Ref Range   Phosphorus 4.4 2.5 - 4.6 mg/dL  Hemoglobin O1H  Result Value Ref Range   Hgb A1c MFr Bld 5.1 4.8 - 5.6 %   Mean Plasma Glucose 100 mg/dL  Glucose, capillary  Result Value Ref Range   Glucose-Capillary 155 (H) 65 - 99 mg/dL  Glucose, capillary  Result Value Ref Range  Glucose-Capillary 211 (H) 65 - 99 mg/dL  CBC  Result Value Ref Range   WBC 16.3 (H) 3.8 - 10.6 K/uL   RBC 5.48 4.40 - 5.90 MIL/uL   Hemoglobin 14.1 13.0 - 18.0 g/dL   HCT 74.243.6 59.540.0 - 63.852.0 %   MCV 79.5 (L) 80.0 - 100.0 fL   MCH 25.8 (L) 26.0 - 34.0 pg   MCHC 32.4 32.0 - 36.0 g/dL   RDW 75.618.4 (H) 43.311.5 - 29.514.5  %   Platelets 229 150 - 440 K/uL  Basic metabolic panel  Result Value Ref Range   Sodium 138 135 - 145 mmol/L   Potassium 4.2 3.5 - 5.1 mmol/L   Chloride 97 (L) 101 - 111 mmol/L   CO2 31 22 - 32 mmol/L   Glucose, Bld 201 (H) 65 - 99 mg/dL   BUN 36 (H) 6 - 20 mg/dL   Creatinine, Ser 1.881.40 (H) 0.61 - 1.24 mg/dL   Calcium 9.6 8.9 - 41.610.3 mg/dL   GFR calc non Af Amer >60 >60 mL/min   GFR calc Af Amer >60 >60 mL/min   Anion gap 10 5 - 15  Glucose, capillary  Result Value Ref Range   Glucose-Capillary 196 (H) 65 - 99 mg/dL  Glucose, capillary  Result Value Ref Range   Glucose-Capillary 175 (H) 65 - 99 mg/dL  Glucose, capillary  Result Value Ref Range   Glucose-Capillary 189 (H) 65 - 99 mg/dL   Comment 1 Notify RN   Glucose, capillary  Result Value Ref Range   Glucose-Capillary 274 (H) 65 - 99 mg/dL   Comment 1 Notify RN   Glucose, capillary  Result Value Ref Range   Glucose-Capillary 244 (H) 65 - 99 mg/dL  CBC  Result Value Ref Range   WBC 12.8 (H) 3.8 - 10.6 K/uL   RBC 5.62 4.40 - 5.90 MIL/uL   Hemoglobin 14.5 13.0 - 18.0 g/dL   HCT 60.644.7 30.140.0 - 60.152.0 %   MCV 79.7 (L) 80.0 - 100.0 fL   MCH 25.8 (L) 26.0 - 34.0 pg   MCHC 32.4 32.0 - 36.0 g/dL   RDW 09.318.3 (H) 23.511.5 - 57.314.5 %   Platelets 239 150 - 440 K/uL  Basic metabolic panel  Result Value Ref Range   Sodium 138 135 - 145 mmol/L   Potassium 4.1 3.5 - 5.1 mmol/L   Chloride 98 (L) 101 - 111 mmol/L   CO2 32 22 - 32 mmol/L   Glucose, Bld 120 (H) 65 - 99 mg/dL   BUN 41 (H) 6 - 20 mg/dL   Creatinine, Ser 2.201.43 (H) 0.61 - 1.24 mg/dL   Calcium 9.5 8.9 - 25.410.3 mg/dL   GFR calc non Af Amer 59 (L) >60 mL/min   GFR calc Af Amer >60 >60 mL/min   Anion gap 8 5 - 15  Glucose, capillary  Result Value Ref Range   Glucose-Capillary 171 (H) 65 - 99 mg/dL  Glucose, capillary  Result Value Ref Range   Glucose-Capillary 112 (H) 65 - 99 mg/dL   Comment 1 Notify RN    Comment 2 Document in Chart   Glucose, capillary  Result Value Ref Range    Glucose-Capillary 237 (H) 65 - 99 mg/dL   Comment 1 Notify RN    Comment 2 Document in Chart   ECHOCARDIOGRAM COMPLETE  Result Value Ref Range   Weight 4,960 oz   Height 67 in   BP 141/94 mmHg   [place] Venous Img Lower Unilateral Right  Result Date: 03/29/2018 CLINICAL DATA:  Worsening RIGHT leg edema for 1 week. History of DVT and LEFT leg fracture, osteosarcoma. EXAM: RIGHT LOWER EXTREMITY VENOUS DOPPLER ULTRASOUND TECHNIQUE: Gray-scale sonography with graded compression, as well as color Doppler and duplex ultrasound were performed to evaluate the lower extremity deep venous systems from the level of the common femoral vein and including the common femoral, femoral, profunda femoral, popliteal and calf veins including the posterior tibial, peroneal and gastrocnemius veins when visible. The superficial great saphenous vein was also interrogated. Spectral Doppler was utilized to evaluate flow at rest and with distal augmentation maneuvers in the common femoral, femoral and popliteal veins. COMPARISON:  None. FINDINGS: Limited by large body habitus. Contralateral Common Femoral Vein: Respiratory phasicity is normal and symmetric with the symptomatic side. No evidence of thrombus. Normal compressibility. Common Femoral Vein: No evidence of thrombus. Normal compressibility, respiratory phasicity and response to augmentation. Profunda Femoral Vein: No evidence of thrombus. Normal compressibility and flow on color Doppler imaging. Femoral Vein: No evidence of thrombus. Normal compressibility, respiratory phasicity and response to augmentation. Popliteal Vein: No evidence of thrombus. Normal compressibility, respiratory phasicity and response to augmentation. Calf Veins: No evidence of thrombus. Normal compressibility and flow on color Doppler imaging. Superficial Great Saphenous Vein: No evidence of thrombus. Normal compressibility. Other Findings:  None. IMPRESSION: No evidence of RIGHT lower extremity deep  vein thrombosis. Prominent but not pathologically enlarged presumably reactive RIGHT inguinal lymph node. Electronically Signed   By: Awilda Metro M.D.   On: 03/29/2018 19:40   Dg Chest Port 1 View  Result Date: 03/07/2018 CLINICAL DATA:  Shortness of breath improving over the past 2 days. EXAM: PORTABLE CHEST 1 VIEW COMPARISON:  03/05/2018 FINDINGS: Lungs are adequately inflated and demonstrate hazy prominence of the perihilar markings slightly improved. Stable cardiomegaly. Remainder the exam is unchanged. IMPRESSION: Stable cardiomegaly with findings suggesting improving mild interstitial edema. Electronically Signed   By: Elberta Fortis M.D.   On: 03/07/2018 09:04   Dg Chest Port 1 View  Result Date: 03/05/2018 CLINICAL DATA:  Acute respiratory failure EXAM: PORTABLE CHEST 1 VIEW COMPARISON:  Mar 04, 2018 FINDINGS: Stable cardiomegaly. Persistent but improved pulmonary edema. No other changes. IMPRESSION: Cardiomegaly and persistent but improving pulmonary edema. Electronically Signed   By: Gerome Sam III M.D   On: 03/05/2018 07:34   Dg Chest Portable 1 View  Result Date: 03/04/2018 CLINICAL DATA:  Diagnosed with upper respiratory tract infection yesterday. Shortness of breath. History of CHF. EXAM: PORTABLE CHEST 1 VIEW COMPARISON:  Chest radiograph July 28, 2014 FINDINGS: Stable moderate cardiomegaly. Pulmonary vascular congestion and mild interstitial prominence without pleural effusion or focal consolidation. No pneumothorax. Soft tissue planes and included osseous structures are nonsuspicious. IMPRESSION: Similar moderate cardiomegaly and mild interstitial edema. Electronically Signed   By: Awilda Metro M.D.   On: 03/04/2018 17:12

## 2018-03-29 NOTE — ED Provider Notes (Signed)
[facility] Emergency Department Provider Note  ____________________________________________   First MD Initiated Contact with Patient 03/29/18 2001     (approximate)  I have reviewed the triage vital signs and the nursing notes.   HISTORY  Chief Complaint Leg Swelling   HPI Charles Rangel is a 42 y.o. male who comes to the emergency department with bilateral lower extremity edema increasing for the past several weeks particularly on the right.  He does have a remote history of a provoked DVT in the past secondary to travel.  He completed 6 months of anticoagulant therapy and is never had a DVT since.  The swelling in his leg is worse at the day and improved when he is elevating the legs.  He does have mild to moderate cramping aching discomfort in bilateral lower extremities.  No chest pain or shortness of breath.   Past Medical History:  Diagnosis Date  . CHF (congestive heart failure) (HCC)   . COPD (chronic obstructive pulmonary disease) (HCC)   . Diabetes mellitus without complication (HCC)   . DVT (deep vein thrombosis) in pregnancy (HCC)   . HTN (hypertension)   . Hypertension   . OSA (obstructive sleep apnea)     Patient Active Problem List   Diagnosis Date Noted  . Acute respiratory failure with hypoxia (HCC) 03/04/2018    Past Surgical History:  Procedure Laterality Date  . Left leg surgery for fracture      Prior to Admission medications   Medication Sig Start Date End Date Taking? Authorizing Provider  aspirin EC 81 MG tablet Take 81 mg daily by mouth.    [provider]  carvedilol (COREG) 25 MG tablet Take 1 tablet by mouth 2 (two) times daily with a meal.     [provider]  Cholecalciferol (VITAMIN D3) 1000 units CAPS Take 1 capsule by mouth daily.     [provider]  cyclobenzaprine (FLEXERIL) 10 MG tablet Take 1 tablet by mouth 3 (three) times daily as needed.    [provider]    furosemide (LASIX) 20 MG tablet Take 1 tablet (20 mg total) by mouth 2 (two) times daily. 03/07/18 05/06/18  [place] SirenSainani, Vivek J, MD  lisinopril (PRINIVIL,ZESTRIL) 5 MG tablet Take 2.5 tablets by mouth.     [provider]  metFORMIN (GLUCOPHAGE) 1000 MG tablet Take 1,000 mg by mouth 2 (two) times daily with a meal.    [provider]  predniSONE (DELTASONE) 10 MG tablet Label  & dispense according to the schedule below. 5 Pills PO for 1 day then, 4 Pills PO for 1 day, 3 Pills PO for 1 day, 2 Pills PO for 1 day, 1 Pill PO for 1 days then STOP. 03/07/18   [place] SirenSainani, Vivek J, MD  traMADol (ULTRAM) 50 MG tablet Take 1 tablet (50 mg total) by mouth every 6 (six) hours as needed. 03/29/18 03/29/19  Merrily Brittleifenbark, Dorri Ozturk, MD    Allergies Tylenol [acetaminophen]  Family History  Problem Relation Age of Onset  . Hypertension Mother     Social History Social History   Tobacco Use  . Smoking status: Former Games developermoker  . Smokeless tobacco: Never Used  Substance Use Topics  . Alcohol use: Yes    Frequency: Never    Comment: rarely  . Drug use: No    Review of Systems Constitutional: No fever/chills ENT: No sore throat. Cardiovascular: Denies chest pain. Respiratory: Denies shortness of breath. Gastrointestinal: No abdominal pain.  No nausea, no  vomiting.  No diarrhea.  No constipation. Musculoskeletal: Negative for back pain. Neurological: Negative for headaches   ____________________________________________   PHYSICAL EXAM:  VITAL SIGNS: ED Triage Vitals  Enc Vitals Group     BP 03/29/18 1816 (!) 142/92     Pulse Rate 03/29/18 1816 94     Resp 03/29/18 1816 20     Temp 03/29/18 1816 98.1 F (36.7 C)     Temp Source 03/29/18 1816 Oral     SpO2 03/29/18 1816 95 %     Weight 03/29/18 1817 (!) 305 lb (138.3 kg)     Height 03/29/18 1817 5\' 6"  (1.676 m)     Head Circumference --      Peak Flow --      Pain Score 03/29/18 1817 8     Pain Loc --      Pain Edu? --      Excl. in  GC? --     Constitutional: Alert and oriented x4 well-appearing nontoxic no diaphoresis speaks in full clear sentences Head: Atraumatic. Nose: No congestion/rhinnorhea. Mouth/Throat: No trismus Neck: No stridor.   Cardiovascular: Regular rate rhythm Respiratory: Normal respiratory effort.  No retractions. MSK: Bilateral lower extremity pitting edema.  Right leg slightly more swollen than the left but only minimally so Neurologic:  Normal speech and language. No gross focal neurologic deficits are appreciated.  Skin:  Skin is warm, dry and intact. No rash noted.    ____________________________________________  LABS (all labs ordered are listed, but only abnormal results are displayed)  Labs Reviewed - No data to display   __________________________________________  EKG   ____________________________________________  RADIOLOGY  Ultrasound right lower extremity reviewed by me with no clot ____________________________________________   DIFFERENTIAL includes but not limited to  DVT, lymphadenitis, cellulitis   PROCEDURES  Procedure(s) performed: no  Procedures  Critical Care performed: no  Observation: no ____________________________________________   INITIAL IMPRESSION / ASSESSMENT AND PLAN / ED COURSE  Pertinent labs & imaging results that were available during my care of the patient were reviewed by me and considered in my medical decision making (see chart for details).  The patient has no evidence of DVT.  I discussed with him the diagnostic uncertainty and that if he is truly concerned he should have another ultrasound performed in 1 week.  Discharged home in improved condition verbalized understanding agree with plan.      ____________________________________________   FINAL CLINICAL IMPRESSION(S) / ED DIAGNOSES  Final diagnoses:  Leg swelling      NEW MEDICATIONS STARTED DURING THIS VISIT:  Discharge Medication List as of 03/29/2018  8:09  PM       Note:  This document was prepared using Dragon voice recognition software and may include unintentional dictation errors.      Merrily Brittle, MD 03/30/18 [phone]

## 2018-03-29 NOTE — ED Triage Notes (Signed)
Increasing R leg edema x 2 weeks. Denies long trips or injuries. States usually has mild bilateral leg edema.

## 2018-04-06 ENCOUNTER — Emergency Department
Admission: EM | Admit: 2018-04-06 | Discharge: 2018-04-06 | Disposition: A | Discharge status: Home / Self Care | Payer: Managed Care, Other (non HMO) | Attending: Emergency Medicine | Admitting: Emergency Medicine

## 2018-04-06 ENCOUNTER — Other Ambulatory Visit: Payer: Self-pay

## 2018-04-06 ENCOUNTER — Emergency Department: Payer: Managed Care, Other (non HMO)

## 2018-04-06 ENCOUNTER — Encounter: Payer: Self-pay | Admitting: Emergency Medicine

## 2018-04-06 DIAGNOSIS — L03115 Cellulitis of right lower limb: Secondary | ICD-10-CM | POA: Diagnosis not present

## 2018-04-06 DIAGNOSIS — R600 Localized edema: Secondary | ICD-10-CM

## 2018-04-06 DIAGNOSIS — I509 Heart failure, unspecified: Secondary | ICD-10-CM | POA: Diagnosis not present

## 2018-04-06 DIAGNOSIS — E119 Type 2 diabetes mellitus without complications: Secondary | ICD-10-CM | POA: Diagnosis not present

## 2018-04-06 DIAGNOSIS — Z87891 Personal history of nicotine dependence: Secondary | ICD-10-CM | POA: Diagnosis not present

## 2018-04-06 DIAGNOSIS — J449 Chronic obstructive pulmonary disease, unspecified: Secondary | ICD-10-CM | POA: Insufficient documentation

## 2018-04-06 DIAGNOSIS — I739 Peripheral vascular disease, unspecified: Secondary | ICD-10-CM | POA: Diagnosis not present

## 2018-04-06 DIAGNOSIS — I110 Hypertensive heart disease with heart failure: Secondary | ICD-10-CM | POA: Diagnosis not present

## 2018-04-06 DIAGNOSIS — R2241 Localized swelling, mass and lump, right lower limb: Secondary | ICD-10-CM | POA: Insufficient documentation

## 2018-04-06 LAB — CBC
HCT: 42.3 % (ref 40.0–52.0)
HEMOGLOBIN: 14.1 g/dL (ref 13.0–18.0)
MCH: 26 pg (ref 26.0–34.0)
MCHC: 33.3 g/dL (ref 32.0–36.0)
MCV: 78.1 fL — ABNORMAL LOW (ref 80.0–100.0)
Platelets: 248 10*3/uL (ref 150–440)
RBC: 5.42 MIL/uL (ref 4.40–5.90)
RDW: 17.3 % — ABNORMAL HIGH (ref 11.5–14.5)
WBC: 5.6 10*3/uL (ref 3.8–10.6)

## 2018-04-06 LAB — BASIC METABOLIC PANEL
Anion gap: 15 (ref 5–15)
BUN: 17 mg/dL (ref 6–20)
CHLORIDE: 95 mmol/L — AB (ref 101–111)
CO2: 28 mmol/L (ref 22–32)
CREATININE: 1.32 mg/dL — AB (ref 0.61–1.24)
Calcium: 9.7 mg/dL (ref 8.9–10.3)
GFR calc Af Amer: >60 mL/min (ref >60)
GFR calc non Af Amer: >60 mL/min (ref >60)
GLUCOSE: 166 mg/dL — AB (ref 65–99)
POTASSIUM: 3.3 mmol/L — AB (ref 3.5–5.1)
SODIUM: 138 mmol/L (ref 135–145)

## 2018-04-06 LAB — BRAIN NATRIURETIC PEPTIDE: B NATRIURETIC PEPTIDE 5: 101 pg/mL — AB (ref 0.0–100.0)

## 2018-04-06 MED ORDER — CEPHALEXIN 500 MG PO CAPS: 500.0000 mg | ORAL_CAPSULE | Freq: Four times a day (QID) | ORAL | 0 refills | Status: AC

## 2018-04-06 NOTE — Discharge Instructions (Addendum)
These continue to elevate your leg above the level of your heart as much as possible.  Please restart using your compression stockings and wear them every single day.  Take the entire course of antibiotics, even if you are feeling better.  Please make an appoint with your primary care doctor at the [facility] in 7 days.  If you continue to have worsening symptoms, your doctor may refer you to a vein specialist.  Return to the emergency department if you develop severe pain, worsening swelling or any redness or discharge from your leg, chest pain or shortness of breath, fever, or any other symptoms concerning to you.

## 2018-04-06 NOTE — ED Notes (Signed)
Pt c/o right leg swelling for the past month, states he was seen here in the past 2 weeks and was told that if the swelling wasn't better or if he continued to have pain to return to the ED. Pt states the swelling has gone down some but is still having pain from the knee down. Pt has noted chronic issues with edema. Pt is in NAD.

## 2018-04-06 NOTE — ED Notes (Signed)
Pt's care discussed with Dr. Mayford KnifeWilliams, see orders.

## 2018-04-06 NOTE — ED Provider Notes (Signed)
[facility] Emergency Department Provider Note  ____________________________________________  Time seen: Approximately 3:29 PM  I have reviewed the triage vital signs and the nursing notes.   HISTORY  Chief Complaint Leg Swelling    HPI Charles Rangel is a 42 y.o. male with a remote history of DVT not currently anticoagulated, DM, COPD, CHF, morbid obesity, presenting with right lower extremity swelling.  The patient reports that for years he has had bilateral lower extremity swelling, but over the past 3 weeks, he has noted asymmetric swelling in the right lower extremity.  The swelling was worse last week, when he presented to the emergency department and had a negative PT ultrasound study.  However, he continues to have some swelling.  He has been given compression stockings in the past but does not wear them.  He has tried elevating his leg at work and at night, with some improvement.  He denies any systemic symptoms including fevers or chills, nausea or vomiting, chest pain or shortness of breath.  Past Medical History:  Diagnosis Date  . CHF (congestive heart failure) (HCC)   . COPD (chronic obstructive pulmonary disease) (HCC)   . Diabetes mellitus without complication (HCC)   . DVT (deep vein thrombosis) in pregnancy (HCC)   . HTN (hypertension)   . Hypertension   . OSA (obstructive sleep apnea)     Patient Active Problem List   Diagnosis Date Noted  . Acute respiratory failure with hypoxia (HCC) 03/04/2018    Past Surgical History:  Procedure Laterality Date  . Left leg surgery for fracture      Current Outpatient Rx  . Order #: [id number] Class: Historical Med  . Order #: [id number] Class: Historical Med  . Order #: [id number] Class: Print  . Order #: [id number] Class: Historical Med  . Order #: [id number] Class: Historical Med  . Order #: [id number] Class: Print  . Order #: [id number] Class: Historical Med  . Order #: [id number] Class: Historical Med   . Order #: [id number] Class: Print  . Order #: [id number] Class: Print    Allergies Tylenol [acetaminophen]  Family History  Problem Relation Age of Onset  . Hypertension Mother     Social History Social History   Tobacco Use  . Smoking status: Former Games developer  . Smokeless tobacco: Never Used  Substance Use Topics  . Alcohol use: Yes    Frequency: Never    Comment: rarely  . Drug use: No    Review of Systems Constitutional: No fever/chills.  Lightheadedness or syncope. ENT: No sore throat. No congestion or rhinorrhea. Cardiovascular: Denies chest pain. Denies palpitations. Respiratory: Denies shortness of breath.  No dyspnea on exertion.  No cough. Gastrointestinal: No abdominal pain.  No nausea, no vomiting.  No diarrhea.  No constipation. Genitourinary: Negative for dysuria. Musculoskeletal: Negative for back pain.  The right lower extremity swelling. Skin: Negative for rash. Neurological: Negative for headaches. No focal numbness, tingling or weakness.     ____________________________________________   PHYSICAL EXAM:  VITAL SIGNS: ED Triage Vitals  Enc Vitals Group     BP 04/06/18 1338 122/87     Pulse Rate 04/06/18 1338 100     Resp 04/06/18 1338 18     Temp 04/06/18 1338 99.3 F (37.4 C)     Temp Source 04/06/18 1338 Oral     SpO2 04/06/18 1338 96 %     Weight 04/06/18 1339 (!) 305 lb (138.3 kg)     Height 04/06/18 1339 5\' 6"  (1.676 m)     Head Circumference --  Peak Flow --      Pain Score 04/06/18 1339 7     Pain Loc --      Pain Edu? --      Excl. in GC? --     Constitutional: Alert and oriented.  Answers questions appropriately.  The patient is chronically ill-appearing, but nontoxic. Eyes: Conjunctivae are normal.  EOMI. No scleral icterus. Head: Atraumatic. Nose: No congestion/rhinnorhea. Mouth/Throat: Mucous membranes are moist.  Neck: No stridor.  Supple.  No JVD.  No meningismus. Cardiovascular: Normal rate, regular rhythm. No  murmurs, rubs or gallops.  Respiratory: Normal respiratory effort.  No accessory muscle use or retractions. Lungs CTAB.  No wheezes, rales or ronchi. Gastrointestinal: Morbidly obese.  Soft, nontender and nondistended.  No guarding or rebound.  No peritoneal signs. Musculoskeletal: Patient has evidence of bilateral peripheral vascular disease in both legs.  From the mid tibial shaft to the ankle, he has darkened discolored skin that is indurated, with lack of hair growth.  The right lower extremity is more edematous than the left and has some minimal erythema around the base of the ankle as well as near the calf with some mild warmth.  The skin is intact and there is no evidence of discharge.  The patient has right DP and PT pulses.  His cap refill is sluggish at 2 to 3 seconds. Neurologic:  A&Ox3.  Speech is clear.  Face and smile are symmetric.  EOMI.  Moves all extremities well. Skin:  Skin is warm, dry.  See above for skin examination of the right lower extremity. Psychiatric: Mood and affect are normal. Speech and behavior are normal.  Normal judgement  ____________________________________________   LABS (all labs ordered are listed, but only abnormal results are displayed)  Labs Reviewed  CBC - Abnormal; Notable for the following components:      Result Value   MCV 78.1 (*)    RDW 17.3 (*)    All other components within normal limits  BASIC METABOLIC PANEL - Abnormal; Notable for the following components:   Potassium 3.3 (*)    Chloride 95 (*)    Glucose, Bld 166 (*)    Creatinine, Ser 1.32 (*)    All other components within normal limits  BRAIN NATRIURETIC PEPTIDE   ____________________________________________  EKG  Not indicated ____________________________________________  RADIOLOGY  [place] Venous Img Lower Unilateral Right  Result Date: 04/06/2018 CLINICAL DATA:  Follow-up DVT, swelling for 3 weeks not resolved EXAM: RIGHT LOWER EXTREMITY VENOUS DOPPLER ULTRASOUND  TECHNIQUE: Gray-scale sonography with graded compression, as well as color Doppler and duplex ultrasound were performed to evaluate the lower extremity deep venous systems from the level of the common femoral vein and including the common femoral, femoral, profunda femoral, popliteal and calf veins including the posterior tibial, peroneal and gastrocnemius veins when visible. The superficial great saphenous vein was also interrogated. Spectral Doppler was utilized to evaluate flow at rest and with distal augmentation maneuvers in the common femoral, femoral and popliteal veins. COMPARISON:  03/29/2018 FINDINGS: Contralateral Common Femoral Vein: Respiratory phasicity is normal and symmetric with the symptomatic side. No evidence of thrombus. Normal compressibility. Common Femoral Vein: No evidence of thrombus. Normal compressibility, respiratory phasicity and response to augmentation. Saphenofemoral Junction: No evidence of thrombus. Normal compressibility and flow on color Doppler imaging. Profunda Femoral Vein: No evidence of thrombus. Normal compressibility and flow on color Doppler imaging. Femoral Vein: No evidence of thrombus. Normal compressibility, respiratory phasicity and response to augmentation. Popliteal Vein: No evidence  of thrombus. Normal compressibility, respiratory phasicity and response to augmentation. Calf Veins: No evidence of thrombus. Normal compressibility and flow on color Doppler imaging. Superficial Great Saphenous Vein: No evidence of thrombus. Normal compressibility. Venous Reflux:  None. Other Findings:  None. IMPRESSION: No evidence of deep venous thrombosis in the RIGHT lower extremity. Electronically Signed   By: Ulyses Southward M.D.   On: 04/06/2018 15:00    ____________________________________________   PROCEDURES  Procedure(s) performed: None  Procedures  Critical Care performed: No ____________________________________________   INITIAL IMPRESSION / ASSESSMENT AND  PLAN / ED COURSE  Pertinent labs & imaging results that were available during my care of the patient were reviewed by me and considered in my medical decision making (see chart for details).  42 y.o. male with multiple chronic comorbidities including evidence of peripheral vascular disease presenting for the second time to the emergency department for right lower extremity swelling.  Overall, the patient is hemodynamically stable and afebrile.  His repeat ultrasound again does not show DVT.  He likely has complications of peripheral vascular disease or possible lymphedema, but given the mild erythema, we will plan to treat him with antibiotics for possible cellulitis.  I have reiterated the importance of using compression stockings and asked him to continue elevation of the leg.  He will follow-up with his primary care physician at the [facility], who will reevaluate him for vascular evaluation as needed.  The patient understands follow-up instructions as well as return precautions.  ____________________________________________  FINAL CLINICAL IMPRESSION(S) / ED DIAGNOSES  Final diagnoses:  Leg edema, right  Peripheral vascular disease (HCC)  Cellulitis of right leg         NEW MEDICATIONS STARTED DURING THIS VISIT:  New Prescriptions   CEPHALEXIN (KEFLEX) 500 MG CAPSULE    Take 1 capsule (500 mg total) by mouth 4 (four) times daily for 10 days.      Rockne Menghini, MD 04/06/18 1536

## 2018-04-06 NOTE — ED Triage Notes (Signed)
Pt states was seen 1 week ago and had US to rule out blood clot. Pt states R calf was "3 times the size that it is now", but that swelling has not completely resolved. Pt states was instructed to return in 1 week for f/u US to make sure he did not have a blood.

## 2018-04-30 ENCOUNTER — Other Ambulatory Visit: Payer: Self-pay

## 2018-04-30 ENCOUNTER — Encounter: Payer: Self-pay | Admitting: Emergency Medicine

## 2018-04-30 ENCOUNTER — Emergency Department: Payer: Managed Care, Other (non HMO)

## 2018-04-30 ENCOUNTER — Emergency Department
Admission: EM | Admit: 2018-04-30 | Discharge: 2018-05-01 | Disposition: A | Discharge status: Home / Self Care | Payer: Managed Care, Other (non HMO) | Attending: Emergency Medicine | Admitting: Emergency Medicine

## 2018-04-30 DIAGNOSIS — I110 Hypertensive heart disease with heart failure: Secondary | ICD-10-CM | POA: Insufficient documentation

## 2018-04-30 DIAGNOSIS — Z79899 Other long term (current) drug therapy: Secondary | ICD-10-CM | POA: Insufficient documentation

## 2018-04-30 DIAGNOSIS — H53132 Sudden visual loss, left eye: Secondary | ICD-10-CM | POA: Diagnosis not present

## 2018-04-30 DIAGNOSIS — Z87891 Personal history of nicotine dependence: Secondary | ICD-10-CM | POA: Diagnosis not present

## 2018-04-30 DIAGNOSIS — E119 Type 2 diabetes mellitus without complications: Secondary | ICD-10-CM | POA: Insufficient documentation

## 2018-04-30 DIAGNOSIS — J449 Chronic obstructive pulmonary disease, unspecified: Secondary | ICD-10-CM | POA: Insufficient documentation

## 2018-04-30 DIAGNOSIS — Z7984 Long term (current) use of oral hypoglycemic drugs: Secondary | ICD-10-CM | POA: Insufficient documentation

## 2018-04-30 DIAGNOSIS — I509 Heart failure, unspecified: Secondary | ICD-10-CM | POA: Diagnosis not present

## 2018-04-30 DIAGNOSIS — Z7982 Long term (current) use of aspirin: Secondary | ICD-10-CM | POA: Diagnosis not present

## 2018-04-30 DIAGNOSIS — H5460 Unqualified visual loss, one eye, unspecified: Secondary | ICD-10-CM

## 2018-04-30 DIAGNOSIS — G43B0 Ophthalmoplegic migraine, not intractable: Secondary | ICD-10-CM

## 2018-04-30 LAB — COMPREHENSIVE METABOLIC PANEL
ALBUMIN: 4.1 g/dL (ref 3.5–5.0)
ALT: 15 U/L (ref 0–44)
ANION GAP: 11 (ref 5–15)
AST: 26 U/L (ref 15–41)
Alkaline Phosphatase: 100 U/L (ref 38–126)
BILIRUBIN TOTAL: 1.3 mg/dL — AB (ref 0.3–1.2)
BUN: 19 mg/dL (ref 6–20)
CHLORIDE: 96 mmol/L — AB (ref 98–111)
CO2: 32 mmol/L (ref 22–32)
Calcium: 10 mg/dL (ref 8.9–10.3)
Creatinine, Ser: 1.25 mg/dL — ABNORMAL HIGH (ref 0.61–1.24)
GFR calc Af Amer: >60 mL/min (ref >60)
GLUCOSE: 129 mg/dL — AB (ref 70–99)
POTASSIUM: 3.9 mmol/L (ref 3.5–5.1)
Sodium: 139 mmol/L (ref 135–145)
TOTAL PROTEIN: 7.8 g/dL (ref 6.5–8.1)

## 2018-04-30 LAB — DIFFERENTIAL
BASOS PCT: 1 %
Basophils Absolute: 0.1 10*3/uL (ref 0–0.1)
EOS ABS: 0.2 10*3/uL (ref 0–0.7)
EOS PCT: 1 %
LYMPHS ABS: 1.4 10*3/uL (ref 1.0–3.6)
Lymphocytes Relative: 12 %
Monocytes Absolute: 0.9 10*3/uL (ref 0.2–1.0)
Monocytes Relative: 8 %
NEUTROS PCT: 78 %
Neutro Abs: 8.7 10*3/uL — ABNORMAL HIGH (ref 1.4–6.5)

## 2018-04-30 LAB — CBC
HCT: 42.9 % (ref 40.0–52.0)
HEMOGLOBIN: 14.2 g/dL (ref 13.0–18.0)
MCH: 25.9 pg — ABNORMAL LOW (ref 26.0–34.0)
MCHC: 33.2 g/dL (ref 32.0–36.0)
MCV: 78.1 fL — ABNORMAL LOW (ref 80.0–100.0)
PLATELETS: 233 10*3/uL (ref 150–440)
RBC: 5.49 MIL/uL (ref 4.40–5.90)
RDW: 17.8 % — AB (ref 11.5–14.5)
WBC: 11.2 10*3/uL — AB (ref 3.8–10.6)

## 2018-04-30 LAB — PROTIME-INR
INR: 0.99
PROTHROMBIN TIME: 13 s (ref 11.4–15.2)

## 2018-04-30 LAB — TROPONIN I

## 2018-04-30 LAB — GLUCOSE, CAPILLARY: Glucose-Capillary: 131 mg/dL — ABNORMAL HIGH (ref 70–99)

## 2018-04-30 LAB — APTT: aPTT: 28 seconds (ref 24–36)

## 2018-04-30 MED ORDER — TROPICAMIDE 1 % OP SOLN
2.0000 [drp] | Freq: Once | OPHTHALMIC | Status: AC
  Administered 2018-04-30: 2 [drp] via OPHTHALMIC
  Filled 2018-04-30: qty 15

## 2018-04-30 MED ORDER — IOPAMIDOL (ISOVUE-370) INJECTION 76%
75.0000 mL | Freq: Once | INTRAVENOUS | Status: AC | PRN
  Administered 2018-04-30: 75 mL via INTRAVENOUS

## 2018-04-30 MED ORDER — COLCHICINE 0.6 MG PO TABS
1.2000 mg | ORAL_TABLET | Freq: Once | ORAL | Status: AC
  Administered 2018-04-30: 1.2 mg via ORAL
  Filled 2018-04-30: qty 2

## 2018-04-30 MED ORDER — PHENYLEPHRINE HCL 2.5 % OP SOLN
1.0000 [drp] | Freq: Once | OPHTHALMIC | Status: AC
  Administered 2018-04-30: 1 [drp] via OPHTHALMIC
  Filled 2018-04-30: qty 2

## 2018-04-30 MED ORDER — TETRACAINE HCL 0.5 % OP SOLN
2.0000 [drp] | Freq: Once | OPHTHALMIC | Status: AC
  Administered 2018-04-30: 2 [drp] via OPHTHALMIC
  Filled 2018-04-30: qty 4

## 2018-04-30 NOTE — Consult Note (Signed)
Subjective: Pt c/o LOV  OU 4 pm today. OD has resolved. OS is slowly improving. Had a similar episode " a few years ago" involving both eyes followed by head ache.  Now developing headache left side.   No other neurologic signs / symptoms. CT negative for acute infarct/ heme   Objective: Vital signs in last 24 hours: Pulse Rate:  [98-102] 99 (07/12 1830) Resp:  [17-24] 24 (07/12 1830) BP: (136-150)/(78-84) 140/82 (07/12 1830) SpO2:  [97 %-100 %] 97 % (07/12 1830)    Exam: UCVA 20/25 OD  20/80+  OS       Pupils pharmalogicaly dilated.       IOP 19 mmHg  OU       Motility normal       VF normal to confrontation Anterior segment NORMAL OU Fundus exam: vitreous clear OU                         Optic nerves normal OU                          Retina normal ou. There is no diabetic retinopathy, vascular occlusion/ loss of perfusion, hemorrhage , tear or detachment in either eye.         Recent Labs    04/30/18 1723  WBC 11.2*  HGB 14.2  HCT 42.9  NA 139  K 3.9  CL 96*  CO2 32  BUN 19  CREATININE 1.25*    Studies/Results: Ct Head Code Stroke Wo Contrast  Result Date: 04/30/2018 CLINICAL DATA:  Code stroke. 42 y/o M; loss of vision to the left eye 1 hour prior to admission. EXAM: CT HEAD WITHOUT CONTRAST TECHNIQUE: Contiguous axial images were obtained from the base of the skull through the vertex without intravenous contrast. COMPARISON:  10/04/2012 CT head. FINDINGS: Brain: No evidence of acute infarction, hemorrhage, hydrocephalus, extra-axial collection or mass lesion/mass effect. Vascular: Calcific atherosclerosis of carotid siphons. No hyperdense vessel identified. Skull: Normal. Negative for fracture or focal lesion. Sinuses/Orbits: Stable left mastoid opacification. Normal aeration of paranasal sinuses. Orbits are unremarkable. Other: None. ASPECTS [facility]([place] Stroke Program Early CT Score) - Ganglionic level infarction (caudate, lentiform nuclei, internal capsule, insula,  M1-M3 cortex): 7 - Supraganglionic infarction (M4-M6 cortex): 3 Total score (0-10 with 10 being normal): 10 IMPRESSION: 1. No acute intracranial abnormality identified. Stable unremarkable CT of the brain. 2. ASPECTS is 10 3. Chronic left mastoid effusion. These results were called by telephone at the time of interpretation on 04/30/2018 at 5:22 pm to Dr. Sharyn BlitzVaranese, who verbally acknowledged these results. Electronically Signed   By: Mitzi HansenLance  Furusawa-Stratton M.D.   On: 04/30/2018 17:31     Assessment/Plan: Given bilateral onset, bilateral though asymmetric improvement, no emboli/ normal perfusion on visual exam, Migraine variant is the most likely diagnosis. Cannot rule out decreased perfusion without fluouro. angiogram in the office setting.  Consider continuing aspirin and hydrate well. Pt [facility]. Card given.  LOS: 0 days   Charles Rangel 7/12/20197:15 PM

## 2018-04-30 NOTE — Progress Notes (Signed)
   04/30/18 1712  Clinical Encounter Type  Visited With Patient not available  Visit Type Initial;ED   Paged for Code Stroke.  Patient unavailable, being assessed by staff members.  No family noted.  Provided pastoral presence for patient and staff.

## 2018-04-30 NOTE — ED Notes (Addendum)
Pt reports improved vision in L eye at this time, post occular massage by Dr. Don PerkingVeronese. [facility] in preparation for opthalmologic consult.

## 2018-04-30 NOTE — ED Triage Notes (Signed)
Pt in via POV with complaints of acute onset loss of vision to left eye beginning approximately 1600.  Pt denies any other associated symptoms. Pt transported directly to CT.

## 2018-04-30 NOTE — ED Notes (Signed)
EDP, York CeriseForbach saw patient at this time prior to transport to CT.

## 2018-04-30 NOTE — ED Provider Notes (Signed)
[facility] Emergency Department Provider Note  ____________________________________________  Time seen: Approximately 8:13 PM  I have reviewed the triage vital signs and the nursing notes.   HISTORY  Chief Complaint Code Stroke   HPI Charles Rangel is a 42 y.o. male with history as listed below presents for evaluation of sudden onset of left eye visual loss.  Patient reports that his symptoms started 4PM.  He reports full sudden visual loss on the left eye which is painless.  Patient is unable to see light out of that eye.  No trauma.  He denies a headache.  He reports that he felt off all day today but denies dizziness, facial droop, slurred speech, unilateral weakness or numbness.  No prior history of stroke.  Past Medical History:  Diagnosis Date  . CHF (congestive heart failure) (HCC)   . COPD (chronic obstructive pulmonary disease) (HCC)   . Diabetes mellitus without complication (HCC)   . DVT (deep vein thrombosis) in pregnancy (HCC)   . HTN (hypertension)   . Hypertension   . OSA (obstructive sleep apnea)     Patient Active Problem List   Diagnosis Date Noted  . Acute respiratory failure with hypoxia (HCC) 03/04/2018    Past Surgical History:  Procedure Laterality Date  . Left leg surgery for fracture      Prior to Admission medications   Medication Sig Start Date End Date Taking? Authorizing Provider  aspirin EC 81 MG tablet Take 81 mg daily by mouth.    [provider]  carvedilol (COREG) 25 MG tablet Take 1 tablet by mouth 2 (two) times daily with a meal.     [provider]  Cholecalciferol (VITAMIN D3) 1000 units CAPS Take 1 capsule by mouth daily.     [provider]  cyclobenzaprine (FLEXERIL) 10 MG tablet Take 1 tablet by mouth 3 (three) times daily as needed.    [provider]  furosemide (LASIX) 20 MG tablet Take 1 tablet (20 mg total) by mouth 2 (two) times daily. 03/07/18 05/06/18  Houston Siren, MD  lisinopril (PRINIVIL,ZESTRIL) 5 MG tablet Take 2.5 tablets by mouth.     [provider]  metFORMIN (GLUCOPHAGE) 1000 MG tablet Take 1,000 mg by mouth 2 (two) times daily with a meal.    [provider]  predniSONE (DELTASONE) 10 MG tablet Label  & dispense according to the schedule below. 5 Pills PO for 1 day then, 4 Pills PO for 1 day, 3 Pills PO for 1 day, 2 Pills PO for 1 day, 1 Pill PO for 1 days then STOP. Patient not taking: Reported on 04/30/2018 03/07/18   Houston Siren, MD  traMADol (ULTRAM) 50 MG tablet Take 1 tablet (50 mg total) by mouth every 6 (six) hours as needed. 03/29/18 03/29/19  Merrily Brittle, MD    Allergies Tylenol [acetaminophen]  Family History  Problem Relation Age of Onset  . Hypertension Mother     Social History Social History   Tobacco Use  . Smoking status: Former Games developer  . Smokeless tobacco: Never Used  Substance Use Topics  . Alcohol use: Yes    Frequency: Never    Comment: rarely  . Drug use: No    Review of Systems  Constitutional: Negative for fever. Eyes: + L sided visual loss ENT: Negative for sore throat. Neck: No neck pain  Cardiovascular: Negative for chest pain. Respiratory: Negative for shortness of breath. Gastrointestinal: Negative for abdominal pain, vomiting or  diarrhea. Genitourinary: Negative for dysuria. Musculoskeletal: Negative for back pain. Skin: Negative for rash. Neurological: Negative for headaches, weakness or numbness. Psych: No SI or HI  ____________________________________________   PHYSICAL EXAM:  VITAL SIGNS: ED Triage Vitals  Enc Vitals Group     BP 04/30/18 1730 (!) 146/78     Pulse Rate 04/30/18 1730 (!) 102     Resp 04/30/18 1730 (!) 24     Temp --      Temp src --      SpO2 04/30/18 1730 97 %     Weight --      Height --      Head Circumference --      Peak Flow --      Pain Score 04/30/18 1716 0     Pain Loc --      Pain Edu? --      Excl. in GC? --      Constitutional: Alert and oriented. Well appearing and in no apparent distress. HEENT:      Head: Normocephalic and atraumatic.         Eyes: Conjunctivae are normal. Sclera is non-icteric. PERRl at 1mm bilaterally, EOMI and painless, 20/40 on the R and unable to see light or movement on the left, IOP 18 bilaterally      Mouth/Throat: Mucous membranes are moist.       Neck: Supple with no signs of meningismus. Cardiovascular: Regular rate and rhythm. No murmurs, gallops, or rubs. 2+ symmetrical distal pulses are present in all extremities. No JVD. Respiratory: Normal respiratory effort. Lungs are clear to auscultation bilaterally. No wheezes, crackles, or rhonchi.  Gastrointestinal: Soft, non tender, and non distended with positive bowel sounds. No rebound or guarding. Musculoskeletal: Nontender with normal range of motion in all extremities. No edema, cyanosis, or erythema of extremities. Neurologic: Normal speech and language. A & O x3, PERRL, EOMI, pupils 1mm bilateral and reactive, no nystagmus, CN III-XII intact, motor testing reveals good tone and bulk throughout. There is no evidence of pronator drift or dysmetria. Muscle strength is 5/5 throughout.  Sensory examination is intact. Gait is normal. Skin: Skin is warm, dry and intact. No rash noted. Psychiatric: Mood and affect are normal. Speech and behavior are normal.  ____________________________________________   LABS (all labs ordered are listed, but only abnormal results are displayed)  Labs Reviewed  CBC - Abnormal; Notable for the following components:      Result Value   WBC 11.2 (*)    MCV 78.1 (*)    MCH 25.9 (*)    RDW 17.8 (*)    All other components within normal limits  DIFFERENTIAL - Abnormal; Notable for the following components:   Neutro Abs 8.7 (*)    All other components within normal limits  COMPREHENSIVE METABOLIC PANEL - Abnormal; Notable for the following components:   Chloride 96 (*)    Glucose, Bld  129 (*)    Creatinine, Ser 1.25 (*)    Total Bilirubin 1.3 (*)    All other components within normal limits  GLUCOSE, CAPILLARY - Abnormal; Notable for the following components:   Glucose-Capillary 131 (*)    All other components within normal limits  TROPONIN I  PROTIME-INR  APTT  CBG MONITORING, ED   ____________________________________________  EKG  ED ECG REPORT I, Nita Sickle, the attending physician, personally viewed and interpreted this ECG.  Sinus tachycardia, rate of 108, normal intervals, normal axis, no ST elevations or depressions. ____________________________________________  RADIOLOGY  I  have personally reviewed the images performed during this visit and I agree with the Radiologist's read.   Interpretation by Radiologist:  Ct Head Code Stroke Wo Contrast  Result Date: 04/30/2018 CLINICAL DATA:  Code stroke. 42 y/o M; loss of vision to the left eye 1 hour prior to admission. EXAM: CT HEAD WITHOUT CONTRAST TECHNIQUE: Contiguous axial images were obtained from the base of the skull through the vertex without intravenous contrast. COMPARISON:  10/04/2012 CT head. FINDINGS: Brain: No evidence of acute infarction, hemorrhage, hydrocephalus, extra-axial collection or mass lesion/mass effect. Vascular: Calcific atherosclerosis of carotid siphons. No hyperdense vessel identified. Skull: Normal. Negative for fracture or focal lesion. Sinuses/Orbits: Stable left mastoid opacification. Normal aeration of paranasal sinuses. Orbits are unremarkable. Other: None. ASPECTS [facility] Stroke Program Early CT Score) - Ganglionic level infarction (caudate, lentiform nuclei, internal capsule, insula, M1-M3 cortex): 7 - Supraganglionic infarction (M4-M6 cortex): 3 Total score (0-10 with 10 being normal): 10 IMPRESSION: 1. No acute intracranial abnormality identified. Stable unremarkable CT of the brain. 2. ASPECTS is 10 3. Chronic left mastoid effusion. These results were called by  telephone at the time of interpretation on 04/30/2018 at 5:22 pm to Dr. Sharyn Blitz, who verbally acknowledged these results. Electronically Signed   By: Mitzi Hansen M.D.   On: 04/30/2018 17:31     ____________________________________________   PROCEDURES  Procedure(s) performed: None Procedures Critical Care performed:  None ____________________________________________   INITIAL IMPRESSION / ASSESSMENT AND PLAN / ED COURSE  42 y.o. male with history as listed below presents for evaluation of sudden onset of left eye visual loss.  Upon arrival to the emergency room patient was made a code stroke.  CT head was negative.  Patient was seen by neurologist who believed patient had a central retinal artery or vein occlusion and not a stroke. He recommended CTA of the head and neck.  My exam showed pupils that were equal round and reactive to light, 1 mm, extraocular movements intact and painless, normal IOP bilaterally, patient unable to see movement or light with the left eye.  That time my concern was that patient had a central retinal artery occlusion or central retinal vein occlusion therefore I massage was performed on the left globe.  Immediately after that patient reports to be able to see light and after another 2 minutes of globe massage patient was able to see however continue to complain of blurry vision.  I called Dr. Lindon Romp from ophthalmology who came in.  He did a funduscopic exam after dilation of the eyes and reports a completely normal exam.  His diagnosis was either complex migraine versus amaurosis fugax versus CRAT with resolution of the clot due to globe massage. He recommended outpatient follow up in his clinic on Monday for further evaluation. CTA is pending. Care transferred to Dr. York Cerise.      As part of my medical decision making, I reviewed the following data within the electronic MEDICAL RECORD NUMBER Nursing notes reviewed and incorporated, Labs reviewed , EKG  interpreted , Old chart reviewed, Radiograph reviewed , A consult was requested and obtained from this/these consultant(s) Neurology and Ophthalmology, Notes from prior ED visits and [place] Controlled Substance Database    Pertinent labs & imaging results that were available during my care of the patient were reviewed by me and considered in my medical decision making (see chart for details).    ____________________________________________   FINAL CLINICAL IMPRESSION(S) / ED DIAGNOSES  Final diagnoses:  Unilateral visual loss  NEW MEDICATIONS STARTED DURING THIS VISIT:  ED Discharge Orders    None       Note:  This document was prepared using Dragon voice recognition software and may include unintentional dictation errors.    Nita SickleVeronese, [place], MD 04/30/18 2024

## 2018-04-30 NOTE — ED Triage Notes (Signed)
Arrives stating lost vision to left eye one hour PTA.  Last known well was 1600.

## 2018-04-30 NOTE — ED Notes (Signed)
telemed complete

## 2018-04-30 NOTE — ED Notes (Signed)
CODE STROKE CALLED TO 333 

## 2018-04-30 NOTE — Discharge Instructions (Addendum)
It was a pleasure to take care of you today, and thank you for coming to our emergency department.  If you have any questions or concerns before leaving please ask the nurse to grab me and I'm more than happy to go through your aftercare instructions again.  If you were prescribed any opioid pain medication today such as Norco, Vicodin, Percocet, morphine, hydrocodone, or oxycodone please make sure you do not drive when you are taking this medication as it can alter your ability to drive safely.  If you have any concerns once you are home that you are not improving or are in fact getting worse before you can make it to your follow-up appointment, please do not hesitate to call 911 and come back for further evaluation.   Results for orders placed or performed during the hospital encounter of 04/30/18  CBC  Result Value Ref Range   WBC 11.2 (H) 3.8 - 10.6 K/uL   RBC 5.49 4.40 - 5.90 MIL/uL   Hemoglobin 14.2 13.0 - 18.0 g/dL   HCT 16.1 09.6 - 04.5 %   MCV 78.1 (L) 80.0 - 100.0 fL   MCH 25.9 (L) 26.0 - 34.0 pg   MCHC 33.2 32.0 - 36.0 g/dL   RDW 40.9 (H) 81.1 - 91.4 %   Platelets 233 150 - 440 K/uL  Differential  Result Value Ref Range   Neutrophils Relative % 78 %   Neutro Abs 8.7 (H) 1.4 - 6.5 K/uL   Lymphocytes Relative 12 %   Lymphs Abs 1.4 1.0 - 3.6 K/uL   Monocytes Relative 8 %   Monocytes Absolute 0.9 0.2 - 1.0 K/uL   Eosinophils Relative 1 %   Eosinophils Absolute 0.2 0 - 0.7 K/uL   Basophils Relative 1 %   Basophils Absolute 0.1 0 - 0.1 K/uL  Comprehensive metabolic panel  Result Value Ref Range   Sodium 139 135 - 145 mmol/L   Potassium 3.9 3.5 - 5.1 mmol/L   Chloride 96 (L) 98 - 111 mmol/L   CO2 32 22 - 32 mmol/L   Glucose, Bld 129 (H) 70 - 99 mg/dL   BUN 19 6 - 20 mg/dL   Creatinine, Ser 7.82 (H) 0.61 - 1.24 mg/dL   Calcium 95.6 8.9 - 21.3 mg/dL   Total Protein 7.8 6.5 - 8.1 g/dL   Albumin 4.1 3.5 - 5.0 g/dL   AST 26 15 - 41 U/L   ALT 15 0 - 44 U/L   Alkaline  Phosphatase 100 38 - 126 U/L   Total Bilirubin 1.3 (H) 0.3 - 1.2 mg/dL   GFR calc non Af Amer >60 >60 mL/min   GFR calc Af Amer >60 >60 mL/min   Anion gap 11 5 - 15  Troponin I  Result Value Ref Range   Troponin I <0.03 <0.03 ng/mL  Glucose, capillary  Result Value Ref Range   Glucose-Capillary 131 (H) 70 - 99 mg/dL  Protime-INR  Result Value Ref Range   Prothrombin Time 13.0 11.4 - 15.2 seconds   INR 0.99   APTT  Result Value Ref Range   aPTT 28 24 - 36 seconds   Ct Angio Head W Or Wo Contrast  Result Date: 04/30/2018 CLINICAL DATA:  42 y/o M; acute onset loss of vision in the left eye. EXAM: CT ANGIOGRAPHY HEAD AND NECK TECHNIQUE: Multidetector CT imaging of the head and neck was performed using the standard protocol during bolus administration of intravenous contrast. Multiplanar CT image reconstructions and MIPs  were obtained to evaluate the vascular anatomy. Carotid stenosis measurements (when applicable) are obtained utilizing NASCET criteria, using the distal internal carotid diameter as the denominator. CONTRAST:  75 cc Isovue 370 COMPARISON:  04/30/2018 CT head FINDINGS: CTA NECK FINDINGS Aortic arch: Bovine variant branching. Imaged portion shows no evidence of aneurysm or dissection. No significant stenosis of the major arch vessel origins. Right carotid system: No evidence of dissection, stenosis (50% or greater) or occlusion. Left carotid system: No evidence of dissection, stenosis (50% or greater) or occlusion. Vertebral arteries: Left dominant. No evidence of dissection, stenosis (50% or greater) or occlusion. Skeleton: Mild multilevel cervical spondylosis. No high-grade bony canal stenosis. Other neck: Negative. Upper chest: 3.5 cm main pulmonary artery may represent pulmonary artery hypertension. Review of the MIP images confirms the above findings CTA HEAD FINDINGS Anterior circulation: No significant stenosis, proximal occlusion, aneurysm, or vascular malformation. Posterior  circulation: No significant stenosis, proximal occlusion, aneurysm, or vascular malformation. Venous sinuses: As permitted by contrast timing, patent. Anatomic variants: Small anterior communicating artery. No posterior communicating artery identified, likely hypoplastic or absent. Delayed phase: Enhancing structure within the right posterior orbit inferior to the inferior rectus measuring 10 x 8 x 4 mm (AP x ML x CC series 8, image 84 and series 7, image 62), probably a small hemangioma. No significant mass effect. Review of the MIP images confirms the above findings IMPRESSION: 1. No large vessel occlusion, hemodynamically significant stenosis by NASCET criteria, dissection, or aneurysm. 2. 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 No significant mass effect. 3. Enlarged main pulmonary artery may represent pulmonary artery hypertension. Electronically Signed   By: Mitzi Hansen M.D.   On: 04/30/2018 21:12   Ct Angio Neck W And/or Wo Contrast  Result Date: 04/30/2018 CLINICAL DATA:  42 y/o M; acute onset loss of vision in the left eye. EXAM: CT ANGIOGRAPHY HEAD AND NECK TECHNIQUE: Multidetector CT imaging of the head and neck was performed using the standard protocol during bolus administration of intravenous contrast. Multiplanar CT image reconstructions and MIPs were obtained to evaluate the vascular anatomy. Carotid stenosis measurements (when applicable) are obtained utilizing NASCET criteria, using the distal internal carotid diameter as the denominator. CONTRAST:  75 cc Isovue 370 COMPARISON:  04/30/2018 CT head FINDINGS: CTA NECK FINDINGS Aortic arch: Bovine variant branching. Imaged portion shows no evidence of aneurysm or dissection. No significant stenosis of the major arch vessel origins. Right carotid system: No evidence of dissection, stenosis (50% or greater) or occlusion. Left carotid system: No evidence of dissection, stenosis (50% or  greater) or occlusion. Vertebral arteries: Left dominant. No evidence of dissection, stenosis (50% or greater) or occlusion. Skeleton: Mild multilevel cervical spondylosis. No high-grade bony canal stenosis. Other neck: Negative. Upper chest: 3.5 cm main pulmonary artery may represent pulmonary artery hypertension. Review of the MIP images confirms the above findings CTA HEAD FINDINGS Anterior circulation: No significant stenosis, proximal occlusion, aneurysm, or vascular malformation. Posterior circulation: No significant stenosis, proximal occlusion, aneurysm, or vascular malformation. Venous sinuses: As permitted by contrast timing, patent. Anatomic variants: Small anterior communicating artery. No posterior communicating artery identified, likely hypoplastic or absent. Delayed phase: Enhancing structure within the right posterior orbit inferior to the inferior rectus measuring 10 x 8 x 4 mm (AP x ML x CC series 8, image 84 and series 7, image 62), probably a small hemangioma. No significant mass effect. Review of the MIP images confirms the above findings IMPRESSION: 1. No large  vessel occlusion, hemodynamically significant stenosis by NASCET criteria, dissection, or aneurysm. 2. 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 No significant mass effect. 3. Enlarged main pulmonary artery may represent pulmonary artery hypertension. Electronically Signed   By: Lance  Furusawa-Stratton M.D.   On: 04/30/2018 21:12   [place] Venous Img LoMitzi Hansenwer Unilateral Right  Result Date: 04/06/2018 CLINICAL DATA:  Follow-up DVT, swelling for 3 weeks not resolved EXAM: RIGHT LOWER EXTREMITY VENOUS DOPPLER ULTRASOUND TECHNIQUE: Gray-scale sonography with graded compression, as well as color Doppler and duplex ultrasound were performed to evaluate the lower extremity deep venous systems from the level of the common femoral vein and including the common femoral, femoral, profunda femoral,  popliteal and calf veins including the posterior tibial, peroneal and gastrocnemius veins when visible. The superficial great saphenous vein was also interrogated. Spectral Doppler was utilized to evaluate flow at rest and with distal augmentation maneuvers in the common femoral, femoral and popliteal veins. COMPARISON:  03/29/2018 FINDINGS: Contralateral Common Femoral Vein: Respiratory phasicity is normal and symmetric with the symptomatic side. No evidence of thrombus. Normal compressibility. Common Femoral Vein: No evidence of thrombus. Normal compressibility, respiratory phasicity and response to augmentation. Saphenofemoral Junction: No evidence of thrombus. Normal compressibility and flow on color Doppler imaging. Profunda Femoral Vein: No evidence of thrombus. Normal compressibility and flow on color Doppler imaging. Femoral Vein: No evidence of thrombus. Normal compressibility, respiratory phasicity and response to augmentation. Popliteal Vein: No evidence of thrombus. Normal compressibility, respiratory phasicity and response to augmentation. Calf Veins: No evidence of thrombus. Normal compressibility and flow on color Doppler imaging. Superficial Great Saphenous Vein: No evidence of thrombus. Normal compressibility. Venous Reflux:  None. Other Findings:  None. IMPRESSION: No evidence of deep venous thrombosis in the RIGHT lower extremity. Electronically Signed   By: Ulyses SouthwardMark  Boles M.D.   On: 04/06/2018 15:00   Ct Head Code Stroke Wo Contrast  Result Date: 04/30/2018 CLINICAL DATA:  Code stroke. 42 y/o M; loss of vision to the left eye 1 hour prior to admission. EXAM: CT HEAD WITHOUT CONTRAST TECHNIQUE: Contiguous axial images were obtained from the base of the skull through the vertex without intravenous contrast. COMPARISON:  10/04/2012 CT head. FINDINGS: Brain: No evidence of acute infarction, hemorrhage, hydrocephalus, extra-axial collection or mass lesion/mass effect. Vascular: Calcific  atherosclerosis of carotid siphons. No hyperdense vessel identified. Skull: Normal. Negative for fracture or focal lesion. Sinuses/Orbits: Stable left mastoid opacification. Normal aeration of paranasal sinuses. Orbits are unremarkable. Other: None. ASPECTS [facility]([place] Stroke Program Early CT Score) - Ganglionic level infarction (caudate, lentiform nuclei, internal capsule, insula, M1-M3 cortex): 7 - Supraganglionic infarction (M4-M6 cortex): 3 Total score (0-10 with 10 being normal): 10 IMPRESSION: 1. No acute intracranial abnormality identified. Stable unremarkable CT of the brain. 2. ASPECTS is 10 3. Chronic left mastoid effusion. These results were called by telephone at the time of interpretation on 04/30/2018 at 5:22 pm to Dr. Sharyn BlitzVaranese, who verbally acknowledged these results. Electronically Signed   By: Mitzi HansenLance  Furusawa-Stratton M.D.   On: 04/30/2018 17:31

## 2018-04-30 NOTE — ED Notes (Signed)
Blood sugar 139

## 2018-04-30 NOTE — ED Notes (Addendum)
Pt states he is not willing to be d/c until a doctor addresses his ankle pain. Dr Don PerkingVeronese had told pt to follow up with PCP, and reiterated by this RN. But pt persistent, MD Rifenbark aware and to see pt as soon as his critical patient is stable that he is at bedside with at this time. Pt aware

## 2018-04-30 NOTE — ED Notes (Signed)
Writer moved pt to room 40 per nurse instructions.

## 2018-04-30 NOTE — Consult Note (Signed)
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   Differential Diagnosis:  1. Cardioembolic stroke  2. Small vessel disease/ lacune  3. Thromboembolic, artery-to-artery mechanism  4. Hypercoagulable state-related infarct  5. Transient ischemic attack  6. Thrombotic mechanism, large artery disease   ------------------------------------------------------------------------------------------- tPA decision and other recommendations:   _ Patient is not a tPA candidate Head CT did not show any acute hemorrhage. reviewed report (if available) and images  Reason: _ unfortunately IV tPA is not indicted for CRAO, (if diagnosis is confirmed by ophthalmology exam) interaarterial thrombolytic therapy can be considered.  Recommendations STAT ophthalmology consult  dysphagia screen ASA if no contraindications head of bed flat IV fluids NS Stroke work up with: noncontrast brain MRI, 2D ECHO, lipid panel, HbA1c (Goal LDL<70, HbA1c<7)  inpatient neurology consultation Inpatient stroke evaluation as per Neurology/ Internal Medicine Discussed with ED physician/medical staff Please contact [facility] Navigator to reach me if further questions/concerns  arise.  -------------------------------------------------------------------------------------------- Reason for Stroke Alert and History of Present Illness:  _ Patient is a(n) 42 years old  male , with history of Diabetes Mellitus, hypertension, Hyperlipidemia/Hypercholesterolemia , CHF, COPD, DVT, smoker, on Aspirin last known well: 1600 left eye vision went gray and then completely black, no pain  Blood Pressure: 147/81    ---------------------------------------------------------------------------------------------- Review of Systems:  Constitutional:  Negative except as documented in history of present illness.   Eye:  Negative except as documented in history of present illness.   Ear/Nose/Mouth/Throat:  Negative except as documented in history of present illness.   Respiratory:  Negative except as documented in history of present illness.   Cardiovascular:  Negative except as documented in history of present illness.   Gastrointestinal:  Negative except as documented in history of present illness.   Musculoskeletal:  Negative except as documented in history of present illness.   Neurologic:  Negative except as documented in history of present illness.    --------------------------------------------------------------------------------------------------- Examination:   NIHSS Details documented in the note  2 (complete vision loss left eye), also states the pinprick testing is "more rough" on left face  # NIHSS - NIH STROKE SCALE - 11 items:  1A: Level of consciousness: 0 - Alert; keenly responsive.  1B: Ask month and age: 27 - Both questions right  1C: 'Blink eyes' & 'squeeze hands': 0 - Performs both tasks  2: Horizontal extraocular movements: 0 - Normal.  3: Visual fields: 2 - Complete hemianopia.  4: Facial palsy: 0 - Normal symmetry.  5A: Left arm motor drift: 0 - No drift for 10 seconds, or Amputation/joint fusion.  5B: Right arm motor drift: 0 - No drift for 10  seconds, or Amputation/joint fusion.  6A: Left leg motor drift: 0 - No drift for 5 seconds, or Amputation/joint fusion.  6B: Right leg motor drift: 0 - No drift for 5 seconds, or Amputation/joint fusion.  7: Limb Ataxia: 0 - No ataxia, or does not understand, or Paralyzed, or Amputation/joint fusion.  8: Sensation: 0 - Normal; no sensory loss.  9: Language/aphasia:  0 - Normal; no aphasia.  10: Dysarthria: 0 - Normal, or Intubated/unable to test  11: Extinction/inattention: 0 - No abnormality   == Score: 2.  ------------------------------------------------------------------------------------------------------- Medical Decision Making:  - Extensive number of diagnosis or management options are considered above.   - Extensive amount of complex data reviewed.   - High risk of complication and/or morbidity or mortality are associated with differential diagnostic considerations above.  - There may be Uncertain outcome and increased probability of prolonged functional impairment or high probability of severe prolonged functional impairment associated with some of these differential diagnoses.  Medical Data Reviewed:  1.Data reviewed include clinical labs, radiology, Medical Tests;   2.Tests results discussed w/performing or interpreting physician;   3.Obtaining/reviewing old medical records;  4.Obtaining case history from another source;  5.Independent review of image, tracing or specimen.    When possible Patient/family were informed the Neurology Consult would happen via TeleHealth consult by way of interactive audio and video telecommunications and consented to receiving care in this manner.  Case discussed with the Medical staff.  Critical Care notation:  I was called to see this critical patient emergently. I personally evaluated this critical patient for acute stroke evaluation and determining their eligibility for IV Alteplase and interventional therapies. I have spent  approximately _22_ minutes with the patient, including time at bedside, time discussing the case with other physicians, reviewing plan of care, and time independently reviewing the records and scans.

## 2018-05-01 NOTE — ED Notes (Signed)
Dr Manson PasseyBrown addressed pt ankle and treated per pt request with suspicion of gout. Pt. Verbalizes understanding of d/c instructions, medications, and follow-up. VS stable.  Pt. In NAD at time of d/c and denies further concerns regarding this visit. Pt. Stable at the time of departure from the unit, departing unit by the safest and most appropriate manner per that pt condition and limitations with all belongings accounted for. Pt advised to return to the ED at any time for emergent concerns, or for new/worsening symptoms.

## 2018-06-28 ENCOUNTER — Inpatient Hospital Stay
Admission: EM | Admit: 2018-06-28 | Discharge: 2018-07-01 | DRG: 291 | Disposition: A | Discharge status: Home Health | Payer: Managed Care, Other (non HMO) | Attending: Internal Medicine | Admitting: Internal Medicine

## 2018-06-28 ENCOUNTER — Emergency Department: Payer: Managed Care, Other (non HMO)

## 2018-06-28 ENCOUNTER — Encounter: Payer: Self-pay | Admitting: Emergency Medicine

## 2018-06-28 ENCOUNTER — Other Ambulatory Visit: Payer: Self-pay

## 2018-06-28 DIAGNOSIS — Z7984 Long term (current) use of oral hypoglycemic drugs: Secondary | ICD-10-CM

## 2018-06-28 DIAGNOSIS — Z86718 Personal history of other venous thrombosis and embolism: Secondary | ICD-10-CM

## 2018-06-28 DIAGNOSIS — I509 Heart failure, unspecified: Secondary | ICD-10-CM

## 2018-06-28 DIAGNOSIS — J449 Chronic obstructive pulmonary disease, unspecified: Secondary | ICD-10-CM | POA: Diagnosis present

## 2018-06-28 DIAGNOSIS — Z6841 Body Mass Index (BMI) 40.0 and over, adult: Secondary | ICD-10-CM | POA: Diagnosis not present

## 2018-06-28 DIAGNOSIS — I110 Hypertensive heart disease with heart failure: Secondary | ICD-10-CM | POA: Diagnosis not present

## 2018-06-28 DIAGNOSIS — Z87891 Personal history of nicotine dependence: Secondary | ICD-10-CM | POA: Diagnosis not present

## 2018-06-28 DIAGNOSIS — I5033 Acute on chronic diastolic (congestive) heart failure: Secondary | ICD-10-CM | POA: Diagnosis present

## 2018-06-28 DIAGNOSIS — R0902 Hypoxemia: Secondary | ICD-10-CM

## 2018-06-28 DIAGNOSIS — J9601 Acute respiratory failure with hypoxia: Secondary | ICD-10-CM | POA: Diagnosis present

## 2018-06-28 DIAGNOSIS — Z7982 Long term (current) use of aspirin: Secondary | ICD-10-CM | POA: Diagnosis not present

## 2018-06-28 DIAGNOSIS — R52 Pain, unspecified: Secondary | ICD-10-CM

## 2018-06-28 DIAGNOSIS — M109 Gout, unspecified: Secondary | ICD-10-CM | POA: Diagnosis present

## 2018-06-28 DIAGNOSIS — E119 Type 2 diabetes mellitus without complications: Secondary | ICD-10-CM | POA: Diagnosis present

## 2018-06-28 DIAGNOSIS — Z8249 Family history of ischemic heart disease and other diseases of the circulatory system: Secondary | ICD-10-CM

## 2018-06-28 DIAGNOSIS — G4733 Obstructive sleep apnea (adult) (pediatric): Secondary | ICD-10-CM | POA: Diagnosis present

## 2018-06-28 LAB — CBC WITH DIFFERENTIAL/PLATELET
BASOS ABS: 0 10*3/uL (ref 0–0.1)
Basophils Relative: 1 %
EOS PCT: 4 %
Eosinophils Absolute: 0.2 10*3/uL (ref 0–0.7)
HCT: 37.4 % — ABNORMAL LOW (ref 40.0–52.0)
Hemoglobin: 12.6 g/dL — ABNORMAL LOW (ref 13.0–18.0)
Lymphocytes Relative: 18 %
Lymphs Abs: 1.1 10*3/uL (ref 1.0–3.6)
MCH: 25.9 pg — ABNORMAL LOW (ref 26.0–34.0)
MCHC: 33.7 g/dL (ref 32.0–36.0)
MCV: 76.9 fL — AB (ref 80.0–100.0)
MONO ABS: 0.6 10*3/uL (ref 0.2–1.0)
Monocytes Relative: 9 %
Neutro Abs: 4.2 10*3/uL (ref 1.4–6.5)
Neutrophils Relative %: 68 %
PLATELETS: 215 10*3/uL (ref 150–440)
RBC: 4.86 MIL/uL (ref 4.40–5.90)
RDW: 18.3 % — AB (ref 11.5–14.5)
WBC: 6.1 10*3/uL (ref 3.8–10.6)

## 2018-06-28 LAB — BASIC METABOLIC PANEL
ANION GAP: 10 (ref 5–15)
BUN: 17 mg/dL (ref 6–20)
CALCIUM: 9.7 mg/dL (ref 8.9–10.3)
CHLORIDE: 96 mmol/L — AB (ref 98–111)
CO2: 31 mmol/L (ref 22–32)
Creatinine, Ser: 1.24 mg/dL (ref 0.61–1.24)
GFR calc Af Amer: >60 mL/min (ref >60)
GFR calc non Af Amer: >60 mL/min (ref >60)
GLUCOSE: 138 mg/dL — AB (ref 70–99)
POTASSIUM: 4.2 mmol/L (ref 3.5–5.1)
Sodium: 137 mmol/L (ref 135–145)

## 2018-06-28 LAB — BRAIN NATRIURETIC PEPTIDE: B Natriuretic Peptide: 37 pg/mL (ref 0.0–100.0)

## 2018-06-28 LAB — TROPONIN I: Troponin I: <0.03 ng/mL (ref <0.03)

## 2018-06-28 MED ORDER — TRAMADOL HCL 50 MG PO TABS
50.0000 mg | ORAL_TABLET | Freq: Four times a day (QID) | ORAL | Status: DC | PRN
  Administered 2018-06-29 – 2018-06-30 (×6): 50 mg via ORAL
  Filled 2018-06-28 (×6): qty 1

## 2018-06-28 MED ORDER — VITAMIN D3 25 MCG (1000 UNIT) PO TABS
1000.0000 [IU] | ORAL_TABLET | Freq: Every day | ORAL | Status: DC
  Administered 2018-06-29 – 2018-07-01 (×3): 1000 [IU] via ORAL
  Filled 2018-06-28 (×3): qty 1

## 2018-06-28 MED ORDER — CARVEDILOL 25 MG PO TABS
25.0000 mg | ORAL_TABLET | Freq: Two times a day (BID) | ORAL | Status: DC
  Administered 2018-06-29 – 2018-07-01 (×6): 25 mg via ORAL
  Filled 2018-06-28 (×2): qty 2
  Filled 2018-06-28 (×5): qty 1

## 2018-06-28 MED ORDER — ALBUTEROL SULFATE (2.5 MG/3ML) 0.083% IN NEBU
5.0000 mg | INHALATION_SOLUTION | Freq: Once | RESPIRATORY_TRACT | Status: AC
  Administered 2018-06-28: 5 mg via RESPIRATORY_TRACT
  Filled 2018-06-28: qty 6

## 2018-06-28 MED ORDER — ASPIRIN EC 81 MG PO TBEC
81.0000 mg | DELAYED_RELEASE_TABLET | Freq: Every day | ORAL | Status: DC
  Administered 2018-06-29 – 2018-07-01 (×3): 81 mg via ORAL
  Filled 2018-06-28 (×3): qty 1

## 2018-06-28 MED ORDER — CYCLOBENZAPRINE HCL 10 MG PO TABS
10.0000 mg | ORAL_TABLET | Freq: Three times a day (TID) | ORAL | Status: DC | PRN
  Administered 2018-06-30 – 2018-07-01 (×2): 10 mg via ORAL
  Filled 2018-06-28 (×3): qty 1

## 2018-06-28 MED ORDER — FUROSEMIDE 10 MG/ML IJ SOLN
20.0000 mg | Freq: Three times a day (TID) | INTRAMUSCULAR | Status: DC
  Administered 2018-06-29 – 2018-06-30 (×4): 20 mg via INTRAVENOUS
  Filled 2018-06-28 (×4): qty 2

## 2018-06-28 MED ORDER — HEPARIN SODIUM (PORCINE) 5000 UNIT/ML IJ SOLN
5000.0000 [IU] | Freq: Three times a day (TID) | INTRAMUSCULAR | Status: DC
  Administered 2018-06-28 – 2018-07-01 (×8): 5000 [IU] via SUBCUTANEOUS
  Filled 2018-06-28 (×9): qty 1

## 2018-06-28 MED ORDER — LISINOPRIL 5 MG PO TABS
5.0000 mg | ORAL_TABLET | Freq: Every day | ORAL | Status: DC
  Administered 2018-06-29 – 2018-07-01 (×3): 5 mg via ORAL
  Filled 2018-06-28 (×3): qty 1

## 2018-06-28 MED ORDER — DOCUSATE SODIUM 100 MG PO CAPS
100.0000 mg | ORAL_CAPSULE | Freq: Two times a day (BID) | ORAL | Status: DC | PRN
  Administered 2018-06-30: 100 mg via ORAL
  Filled 2018-06-28: qty 1

## 2018-06-28 MED ORDER — INSULIN ASPART 100 UNIT/ML ~~LOC~~ SOLN
0.0000 [IU] | Freq: Three times a day (TID) | SUBCUTANEOUS | Status: DC
  Administered 2018-06-30 – 2018-07-01 (×3): 1 [IU] via SUBCUTANEOUS
  Filled 2018-06-28 (×3): qty 1

## 2018-06-28 MED ORDER — FUROSEMIDE 10 MG/ML IJ SOLN
40.0000 mg | Freq: Once | INTRAMUSCULAR | Status: AC
  Administered 2018-06-28: 40 mg via INTRAVENOUS
  Filled 2018-06-28: qty 4

## 2018-06-28 NOTE — ED Notes (Signed)
First Nurse Note:  Patient presents to the ED via EMS from home with shortness of breath x 1 week and non-productive cough.  Patient has history of HTN and CHF.  Patient does not use home oxygen.  Oxygen level was 89% on Room Air.  EMS placed patient on 4L of oxygen and gave 1 albuterol treatment.  Patient denies pain.  Per EMS patient became more short of breath with exertion.

## 2018-06-28 NOTE — ED Notes (Signed)
Report called to [place] RN, pt admitted to 249 via stretcher.

## 2018-06-28 NOTE — Progress Notes (Signed)
Family Meeting Note  Advance Directive:yes  Today a meeting took place with the Patient.  The following clinical team members were present during this meeting:MD  The following were discussed:Patient's diagnosis: CHF, Obesity, OSA, DM , Patient's progosis: Unable to determine and Goals for treatment: Full Code  Additional follow-up to be provided: PMD  Time spent during discussion:20 minutes  Altamese Dilling, MD

## 2018-06-28 NOTE — ED Notes (Signed)
Room air sat at 86; placed on 2L [place] and sat at 94-96.

## 2018-06-28 NOTE — ED Triage Notes (Signed)
Pt reports that he has developed SOB over the last week, states that when he takes walks it makes it worse.

## 2018-06-28 NOTE — ED Notes (Signed)
Taken to xray.

## 2018-06-28 NOTE — H&P (Signed)
[facility] [place] at [facility]   PATIENT NAME: Charles Rangel    MR#:  [id number]  DATE OF BIRTH:  Jun 01, 1976  DATE OF ADMISSION:  06/28/2018  PRIMARY CARE PHYSICIAN: [facility]   REQUESTING/REFERRING PHYSICIAN: Lord  CHIEF COMPLAINT:   Chief Complaint  Patient presents with  . Shortness of Breath    HISTORY OF PRESENT ILLNESS: Charles Rangel  is a 42 y.o. male with a known history of diastolic congestive heart failure, COPD, diabetes, hypertension, obstructive sleep apnea uses CPAP-is feeling increasingly short of breath at home.  Emergency room noted to have pulmonary edema on chest x-ray and requiring supplemental oxygen so given to hospitalist team for admission for CHF. She denies any recent change in his medication and confirms his compliance with the medications like diuretics and with fluid restrictions.  PAST MEDICAL HISTORY:   Past Medical History:  Diagnosis Date  . CHF (congestive heart failure) (HCC)   . COPD (chronic obstructive pulmonary disease) (HCC)   . Diabetes mellitus without complication (HCC)   . DVT (deep vein thrombosis) in pregnancy (HCC)   . HTN (hypertension)   . Hypertension   . OSA (obstructive sleep apnea)     PAST SURGICAL HISTORY:  Past Surgical History:  Procedure Laterality Date  . Left leg surgery for fracture      SOCIAL HISTORY:  Social History   Tobacco Use  . Smoking status: Former Games developer  . Smokeless tobacco: Never Used  Substance Use Topics  . Alcohol use: Yes    Frequency: Never    Comment: rarely    FAMILY HISTORY:  Family History  Problem Relation Age of Onset  . Hypertension Mother     DRUG ALLERGIES:  Allergies  Allergen Reactions  . Tylenol [Acetaminophen] Anaphylaxis    REVIEW OF SYSTEMS:   CONSTITUTIONAL: No fever, fatigue or weakness.  EYES: No blurred or double vision.  EARS, NOSE, AND THROAT: No tinnitus or ear pain.  RESPIRATORY: No cough, have shortness of  breath,no wheezing or hemoptysis.  CARDIOVASCULAR: No chest pain, orthopnea, edema.  GASTROINTESTINAL: No nausea, vomiting, diarrhea or abdominal pain.  GENITOURINARY: No dysuria, hematuria.  ENDOCRINE: No polyuria, nocturia,  HEMATOLOGY: No anemia, easy bruising or bleeding SKIN: No rash or lesion. MUSCULOSKELETAL: No joint pain or arthritis.   NEUROLOGIC: No tingling, numbness, weakness.  PSYCHIATRY: No anxiety or depression.   MEDICATIONS AT HOME:  Prior to Admission medications   Medication Sig Start Date End Date Taking? Authorizing Provider  aspirin EC 81 MG tablet Take 81 mg daily by mouth.   Yes [provider]  carvedilol (COREG) 25 MG tablet Take 1 tablet by mouth 2 (two) times daily with a meal.    Yes [provider]  Cholecalciferol (VITAMIN D3) 1000 units CAPS Take 1 capsule by mouth daily.    Yes [provider]  furosemide (LASIX) 20 MG tablet Take 1 tablet (20 mg total) by mouth 2 (two) times daily. 03/07/18 06/28/18 Yes Sainani, Rolly Pancake, MD  lisinopril (PRINIVIL,ZESTRIL) 5 MG tablet Take 2.5 tablets by mouth.    Yes [provider]  metFORMIN (GLUCOPHAGE) 1000 MG tablet Take 1,000 mg by mouth 2 (two) times daily with a meal.   Yes [provider]  cyclobenzaprine (FLEXERIL) 10 MG tablet Take 1 tablet by mouth 3 (three) times daily as needed.    [provider]  predniSONE (DELTASONE) 10 MG tablet Label  & dispense according to the schedule below. 5 Pills PO  for 1 day then, 4 Pills PO for 1 day, 3 Pills PO for 1 day, 2 Pills PO for 1 day, 1 Pill PO for 1 days then STOP. Patient not taking: Reported on 04/30/2018 03/07/18   Houston Siren, MD  traMADol (ULTRAM) 50 MG tablet Take 1 tablet (50 mg total) by mouth every 6 (six) hours as needed. 03/29/18 03/29/19  Merrily Brittle, MD      PHYSICAL EXAMINATION:   VITAL SIGNS: Blood pressure (!) 148/91, pulse 88, temperature 98.8 F (37.1 C), temperature source Oral, resp. rate  17, height 5\' 6"  (1.676 m), weight (!) 149.7 kg, SpO2 95 %.  GENERAL:  42 y.o.-year-old patient lying in the bed with no acute distress.  EYES: Pupils equal, round, reactive to light and accommodation. No scleral icterus. Extraocular muscles intact.  HEENT: Head atraumatic, normocephalic. Oropharynx and nasopharynx clear.  NECK:  Supple, no jugular venous distention. No thyroid enlargement, no tenderness.  LUNGS: Normal breath sounds bilaterally, no wheezing, some crepitation. No use of accessory muscles of respiration.  CARDIOVASCULAR: S1, S2 normal. No murmurs, rubs, or gallops.  ABDOMEN: Soft, nontender, nondistended. Bowel sounds present. No organomegaly or mass.  EXTREMITIES: No pedal edema, cyanosis, or clubbing.  NEUROLOGIC: Cranial nerves II through XII are intact. Muscle strength 5/5 in all extremities. Sensation intact. Gait not checked.  PSYCHIATRIC: The patient is alert and oriented x 3.  SKIN: No obvious rash, lesion, or ulcer.   LABORATORY PANEL:   CBC Recent Labs  Lab 06/28/18 1922  WBC 6.1  HGB 12.6*  HCT 37.4*  PLT 215  MCV 76.9*  MCH 25.9*  MCHC 33.7  RDW 18.3*  LYMPHSABS 1.1  MONOABS 0.6  EOSABS 0.2  BASOSABS 0.0   ------------------------------------------------------------------------------------------------------------------  Chemistries  Recent Labs  Lab 06/28/18 1746  NA 137  K 4.2  CL 96*  CO2 31  GLUCOSE 138*  BUN 17  CREATININE 1.24  CALCIUM 9.7   ------------------------------------------------------------------------------------------------------------------ estimated creatinine clearance is 106.7 mL/min (by C-G formula based on SCr of 1.24 mg/dL). ------------------------------------------------------------------------------------------------------------------ No results for input(s): TSH, T4TOTAL, T3FREE, THYROIDAB in the last 72 hours.  Invalid input(s): FREET3   Coagulation profile No results for input(s): INR, PROTIME in the  last 168 hours. ------------------------------------------------------------------------------------------------------------------- No results for input(s): DDIMER in the last 72 hours. -------------------------------------------------------------------------------------------------------------------  Cardiac Enzymes Recent Labs  Lab 06/28/18 1746  TROPONINI <0.03   ------------------------------------------------------------------------------------------------------------------ Invalid input(s): POCBNP  ---------------------------------------------------------------------------------------------------------------  Urinalysis    Component Value Date/Time   COLORURINE YELLOW (A) 08/27/2017 0816   APPEARANCEUR CLEAR (A) 08/27/2017 0816   APPEARANCEUR Clear 07/28/2014 2317   LABSPEC 1.011 08/27/2017 0816   LABSPEC 1.024 07/28/2014 2317   PHURINE 6.0 08/27/2017 0816   GLUCOSEU NEGATIVE 08/27/2017 0816   GLUCOSEU Negative 07/28/2014 2317   HGBUR NEGATIVE 08/27/2017 0816   BILIRUBINUR NEGATIVE 08/27/2017 0816   BILIRUBINUR Negative 07/28/2014 2317   KETONESUR NEGATIVE 08/27/2017 0816   PROTEINUR NEGATIVE 08/27/2017 0816   NITRITE NEGATIVE 08/27/2017 0816   LEUKOCYTESUR NEGATIVE 08/27/2017 0816   LEUKOCYTESUR Negative 07/28/2014 2317     RADIOLOGY: Dg Chest 2 View  Result Date: 06/28/2018 CLINICAL DATA:  42 year old male with history of shortness of breath for 1 week and nonproductive cough. History of hypertension and congestive heart failure. EXAM: CHEST - 2 VIEW COMPARISON:  Chest x-ray 03/07/2018. FINDINGS: There is cephalization of the pulmonary vasculature, indistinctness of the interstitial markings, and patchy airspace disease throughout the lungs bilaterally suggestive of moderate pulmonary edema. No pleural effusions. Mild cardiomegaly.  Upper mediastinal contours are within normal limits. IMPRESSION: 1. The appearance the chest suggests congestive heart failure, as above.  Electronically Signed   By: Trudie Reed M.D.   On: 06/28/2018 19:13    EKG: Orders placed or performed during the hospital encounter of 06/28/18  . EKG 12-Lead  . EKG 12-Lead  . ED EKG  . ED EKG    IMPRESSION AND PLAN:  *Acute hypoxic respiratory failure Acute on chronic diastolic congestive heart failure  IV Lasix, monitor intake and output, monitor daily weight, fluid restriction.  *Diabetes Hold metformin and give insulin sliding scale coverage.  *Hypertension Continue home medications.  *Obesity Advised for weight control.  *Obstructive sleep apnea Continue CPAP at night.  All the records are reviewed and case discussed with ED provider. Management plans discussed with the patient, family and they are in agreement.  CODE STATUS: Full. Code Status History    Date Active Date Inactive Code Status Order ID Comments User Context   03/04/2018 1928 03/07/2018 1730 Full Code [id number]  Shaune Pollack, MD ED       TOTAL TIME TAKING CARE OF THIS PATIENT: 45 minutes.    Altamese Dilling M.D on 06/28/2018   Between 7am to 6pm - Pager - [phone]  After 6pm go to [email or web address] - password [email or web address]  [facility] [place] [facility]  Office  [phone]  CC: Primary care physician; [facility]   Note: This dictation was prepared with Dragon dictation along with smaller phrase technology. Any transcriptional errors that result from this process are unintentional.

## 2018-06-28 NOTE — ED Provider Notes (Signed)
[facility] Emergency Department Provider Note ____________________________________________   I have reviewed the triage vital signs and the triage nursing note.  HISTORY  Chief Complaint Shortness of Breath   Historian Patient  HPI Charles Rangel is a 42 y.o. male with a history of COPD and CHF, patient of the [place], presents for dyspnea worse with exertion and lying flat worsening over about 1 week.  Occasionally has discomfort below the sternum.  No current chest pain.  Increased lower extremity edema all the way up both legs.  No pleuritic chest pain.  Does not wear home oxygen.   Symptoms are moderate.    Past Medical History:  Diagnosis Date  . CHF (congestive heart failure) (HCC)   . COPD (chronic obstructive pulmonary disease) (HCC)   . Diabetes mellitus without complication (HCC)   . DVT (deep vein thrombosis) in pregnancy (HCC)   . HTN (hypertension)   . Hypertension   . OSA (obstructive sleep apnea)     Patient Active Problem List   Diagnosis Date Noted  . Acute respiratory failure with hypoxia (HCC) 03/04/2018    Past Surgical History:  Procedure Laterality Date  . Left leg surgery for fracture      Prior to Admission medications   Medication Sig Start Date End Date Taking? Authorizing Provider  aspirin EC 81 MG tablet Take 81 mg daily by mouth.   Yes [provider]  carvedilol (COREG) 25 MG tablet Take 1 tablet by mouth 2 (two) times daily with a meal.    Yes [provider]  Cholecalciferol (VITAMIN D3) 1000 units CAPS Take 1 capsule by mouth daily.    Yes [provider]  furosemide (LASIX) 20 MG tablet Take 1 tablet (20 mg total) by mouth 2 (two) times daily. 03/07/18 06/28/18 Yes Sainani, Rolly Pancake, MD  lisinopril (PRINIVIL,ZESTRIL) 5 MG tablet Take 2.5 tablets by mouth.    Yes [provider]  metFORMIN (GLUCOPHAGE) 1000 MG tablet Take 1,000 mg by mouth 2 (two) times daily with a meal.   Yes  [provider]  cyclobenzaprine (FLEXERIL) 10 MG tablet Take 1 tablet by mouth 3 (three) times daily as needed.    [provider]  predniSONE (DELTASONE) 10 MG tablet Label  & dispense according to the schedule below. 5 Pills PO for 1 day then, 4 Pills PO for 1 day, 3 Pills PO for 1 day, 2 Pills PO for 1 day, 1 Pill PO for 1 days then STOP. Patient not taking: Reported on 04/30/2018 03/07/18   Houston Siren, MD  traMADol (ULTRAM) 50 MG tablet Take 1 tablet (50 mg total) by mouth every 6 (six) hours as needed. 03/29/18 03/29/19  Merrily Brittle, MD    Allergies  Allergen Reactions  . Tylenol [Acetaminophen] Anaphylaxis    Family History  Problem Relation Age of Onset  . Hypertension Mother     Social History Social History   Tobacco Use  . Smoking status: Former Games developer  . Smokeless tobacco: Never Used  Substance Use Topics  . Alcohol use: Yes    Frequency: Never    Comment: rarely  . Drug use: No    Review of Systems  Constitutional: Negative for fever. Eyes: Negative for visual changes. ENT: Negative for sore throat. Cardiovascular: Negative for chest pain. Respiratory: Positive for shortness of breath.  No cough or production of sputum. Gastrointestinal: Negative for abdominal pain, vomiting and diarrhea. Genitourinary: Negative for dysuria. Musculoskeletal: Negative for back pain. Skin: Negative  for rash. Neurological: Negative for headache.  ____________________________________________   PHYSICAL EXAM:  VITAL SIGNS: ED Triage Vitals  Enc Vitals Group     BP 06/28/18 1650 (!) 143/91     Pulse Rate 06/28/18 1650 92     Resp --      Temp 06/28/18 1650 98.8 F (37.1 C)     Temp Source 06/28/18 1650 Oral     SpO2 06/28/18 1650 (!) 86 %     Weight --      Height 06/28/18 1648 5\' 6"  (1.676 m)     Head Circumference --      Peak Flow --      Pain Score 06/28/18 1648 0     Pain Loc --      Pain Edu? --      Excl. in GC? --       Constitutional: Alert and oriented.  HEENT      Head: Normocephalic and atraumatic.      Eyes: Conjunctivae are normal. Pupils equal and round.       Ears:         Nose: No congestion/rhinnorhea.      Mouth/Throat: Mucous membranes are moist.      Neck: No stridor. Cardiovascular/Chest: Normal rate, regular rhythm.  No murmurs, rubs, or gallops. Respiratory: Normal respiratory effort without tachypnea nor retractions.  Decreased air movement especially at the bases.  No wheezing.  No rhonchi. Gastrointestinal: Soft. No distention, no guarding, no rebound. Nontender.  Obese. Genitourinary/rectal:Deferred Musculoskeletal: Nontender with normal range of motion in all extremities. No joint effusions.  No lower extremity tenderness.  Severe edema bilaterally, 4+ Neurologic:  Normal speech and language. No gross or focal neurologic deficits are appreciated. Skin:  Skin is warm, dry and intact. No rash noted. Psychiatric: Mood and affect are normal. Speech and behavior are normal. Patient exhibits appropriate insight and judgment.   ____________________________________________  LABS (pertinent positives/negatives) I, Governor Rooks, MD the attending physician have reviewed the labs noted below.  Labs Reviewed  BASIC METABOLIC PANEL  TROPONIN I  CBC WITH DIFFERENTIAL/PLATELET  BRAIN NATRIURETIC PEPTIDE    ____________________________________________    EKG I, Governor Rooks, MD, the attending physician have personally viewed and interpreted all ECGs.  91 bpm.  Normal sinus rhythm.  Narrow QRS.  Normal axis.  Normal ST and T wave ____________________________________________  RADIOLOGY   Chest x-ray: Reviewed radiologist interpretation: Pending __________________________________________  PROCEDURES  Procedure(s) performed: None  Procedures   Peripheral IV.  Placed by myself Dr. Shaune Pollack MD.  Right external jugular vein accessed with 20-gauge IV, no complications.  Critical  Care performed: None   ____________________________________________  ED COURSE / ASSESSMENT AND PLAN  Pertinent labs & imaging results that were available during my care of the patient were reviewed by me and considered in my medical decision making (see chart for details).    Patient arrived hypoxic with volume overload by clinical history and on exam most suspicious of CHF exacerbation as a source of his orthopnea and dyspnea on exertion and swelling and apparent weight gain of potentially about 25 pounds over the past week.  Placed on nasal cannula O2 with resultant improvement in his O2 sat.  Patient care transferred to NP [facility] at shift change 6pm.  Awaiting labs, xray and will need admission.     Although patient's primary care is at the [place], he has private insurance and requests to stay at [facility] for admission.  I was able to  place a right sided external jugular peripheral IV.      Patient / Family / Caregiver informed of clinical course, medical decision-making process, and agree with plan.     ___________________________________________   FINAL CLINICAL IMPRESSION(S) / ED DIAGNOSES   Final diagnoses:  Hypoxia  Acute on chronic congestive heart failure, unspecified heart failure type (HCC)      ___________________________________________         Note: This dictation was prepared with Dragon dictation. Any transcriptional errors that result from this process are unintentional    Governor Rooks, MD 06/28/18 [phone]

## 2018-06-28 NOTE — ED Notes (Signed)
Meal tray given 

## 2018-06-28 NOTE — ED Notes (Signed)
Pt reports that his feet are more swollen than normal. He says the shoes he brought with are the only ones he can fit in-no other of his normal pairs will fit.

## 2018-06-28 NOTE — ED Provider Notes (Signed)
-----------------------------------------   7:14 PM on 06/28/2018 -----------------------------------------   Blood pressure 131/78, pulse 88, temperature 98.8 F (37.1 C), temperature source Oral, resp. rate 16, height 5\' 6"  (1.676 m), weight (!) 149.7 kg, SpO2 98 %.  Assuming care from Dr. Shaune Pollack.  In short, Charles Rangel is a 42 y.o. male with a chief complaint of Shortness of Breath .  Refer to the original H&P for additional details.  The current plan of care is to admit for hypoxia and CHF exacerbation after labs are resulted.  Patient admitted as planned. CXR confirms CHF exacerbation.        Chinita Pester, FNP 06/29/18 1749    Governor Rooks, MD 07/01/18 [phone]

## 2018-06-28 NOTE — ED Notes (Signed)
MD made aware of no one being able to get a line

## 2018-06-28 NOTE — ED Notes (Signed)
Pt was not weighed in triage, but reports that a week ago he weighed 304lbs. The bed weighed him at 330lb.

## 2018-06-28 NOTE — ED Notes (Signed)
Report received from [employer]. Patient care assumed. Patient/RN introduction complete. Pt with no complaints or distress noted at this time. IV noted to right EJ intact and without swelling noted. Monitor in place NSR noted on monitor.

## 2018-06-29 LAB — CBC
HCT: 40.9 % (ref 40.0–52.0)
Hemoglobin: 13.4 g/dL (ref 13.0–18.0)
MCH: 25.8 pg — AB (ref 26.0–34.0)
MCHC: 32.8 g/dL (ref 32.0–36.0)
MCV: 78.7 fL — ABNORMAL LOW (ref 80.0–100.0)
PLATELETS: 213 10*3/uL (ref 150–440)
RBC: 5.19 MIL/uL (ref 4.40–5.90)
RDW: 18.3 % — AB (ref 11.5–14.5)
WBC: 5.3 10*3/uL (ref 3.8–10.6)

## 2018-06-29 LAB — BASIC METABOLIC PANEL
Anion gap: 11 (ref 5–15)
BUN: 16 mg/dL (ref 6–20)
CALCIUM: 9.6 mg/dL (ref 8.9–10.3)
CO2: 35 mmol/L — AB (ref 22–32)
CREATININE: 1.17 mg/dL (ref 0.61–1.24)
Chloride: 96 mmol/L — ABNORMAL LOW (ref 98–111)
GFR calc non Af Amer: >60 mL/min (ref >60)
Glucose, Bld: 103 mg/dL — ABNORMAL HIGH (ref 70–99)
Potassium: 3.7 mmol/L (ref 3.5–5.1)
Sodium: 142 mmol/L (ref 135–145)

## 2018-06-29 LAB — GLUCOSE, CAPILLARY
GLUCOSE-CAPILLARY: 109 mg/dL — AB (ref 70–99)
Glucose-Capillary: 94 mg/dL (ref 70–99)
Glucose-Capillary: 112 mg/dL — ABNORMAL HIGH (ref 70–99)

## 2018-06-29 NOTE — Progress Notes (Signed)
[facility] - [place] at [facility]   PATIENT NAME: Charles Rangel    MR#:  [id number]  DATE OF BIRTH:  04/23/1976  SUBJECTIVE:  CHIEF COMPLAINT: Patient shortness of breath is better than yesterday.  On 2 L of oxygen.  REVIEW OF SYSTEMS:  CONSTITUTIONAL: No fever, fatigue or weakness.  EYES: No blurred or double vision.  EARS, NOSE, AND THROAT: No tinnitus or ear pain.  RESPIRATORY: No cough, improving shortness of breath, denies wheezing or hemoptysis.  CARDIOVASCULAR: No chest pain, orthopnea, edema.  GASTROINTESTINAL: No nausea, vomiting, diarrhea or abdominal pain.  GENITOURINARY: No dysuria, hematuria.  ENDOCRINE: No polyuria, nocturia,  HEMATOLOGY: No anemia, easy bruising or bleeding SKIN: No rash or lesion. MUSCULOSKELETAL: No joint pain or arthritis.   NEUROLOGIC: No tingling, numbness, weakness.  PSYCHIATRY: No anxiety or depression.   DRUG ALLERGIES:   Allergies  Allergen Reactions  . Tylenol [Acetaminophen] Anaphylaxis    VITALS:  Blood pressure (!) 140/93, pulse 77, temperature 97.6 F (36.4 C), temperature source Oral, resp. rate 18, height 5\' 6"  (1.676 m), weight (!) 147.9 kg, SpO2 93 %.  PHYSICAL EXAMINATION:  GENERAL:  42 y.o.-year-old patient lying in the bed with no acute distress.  EYES: Pupils equal, round, reactive to light and accommodation. No scleral icterus. Extraocular muscles intact.  HEENT: Head atraumatic, normocephalic. Oropharynx and nasopharynx clear.  NECK:  Supple, no jugular venous distention. No thyroid enlargement, no tenderness.  LUNGS: Moderate breath sounds bilaterally, no wheezing, rales,rhonchi or crepitation. No use of accessory muscles of respiration.  CARDIOVASCULAR: S1, S2 normal. No murmurs, rubs, or gallops.  ABDOMEN: Soft, nontender, nondistended. Bowel sounds present. No organomegaly or mass.  EXTREMITIES: No pedal edema, cyanosis, or clubbing.  NEUROLOGIC: Cranial nerves II through XII are  intact. Muscle strength 5/5 in all extremities. Sensation intact. Gait not checked.  PSYCHIATRIC: The patient is alert and oriented x 3.  SKIN: No obvious rash, lesion, or ulcer.    LABORATORY PANEL:   CBC Recent Labs  Lab 06/29/18 0539  WBC 5.3  HGB 13.4  HCT 40.9  PLT 213   ------------------------------------------------------------------------------------------------------------------  Chemistries  Recent Labs  Lab 06/29/18 0539  NA 142  K 3.7  CL 96*  CO2 35*  GLUCOSE 103*  BUN 16  CREATININE 1.17  CALCIUM 9.6   ------------------------------------------------------------------------------------------------------------------  Cardiac Enzymes Recent Labs  Lab 06/28/18 1746  TROPONINI <0.03   ------------------------------------------------------------------------------------------------------------------  RADIOLOGY:  Dg Chest 2 View  Result Date: 06/28/2018 CLINICAL DATA:  42 year old male with history of shortness of breath for 1 week and nonproductive cough. History of hypertension and congestive heart failure. EXAM: CHEST - 2 VIEW COMPARISON:  Chest x-ray 03/07/2018. FINDINGS: There is cephalization of the pulmonary vasculature, indistinctness of the interstitial markings, and patchy airspace disease throughout the lungs bilaterally suggestive of moderate pulmonary edema. No pleural effusions. Mild cardiomegaly. Upper mediastinal contours are within normal limits. IMPRESSION: 1. The appearance the chest suggests congestive heart failure, as above. Electronically Signed   By: Trudie Reed M.D.   On: 06/28/2018 19:13    EKG:   Orders placed or performed during the hospital encounter of 06/28/18  . EKG 12-Lead  . EKG 12-Lead  . ED EKG  . ED EKG    ASSESSMENT AND PLAN:    *Acute hypoxic respiratory failure secondary to acute on chronic diastolic CHF Currently on oxygen via nasal cannula wean off as tolerated  *Acute on chronic diastolic congestive  heart failure Continue IV Lasix  monitor  intake and output  monitor daily weight, fluid restriction. Outpatient CHF vest Continue home medication aspirin, Coreg  *Diabetes Hold metformin and give insulin sliding scale coverage.  *Hypertension Continue home medications lisinopril, Coreg and titrate as needed  *Obesity Lifestyle modification advised  *Obstructive sleep apnea Continue CPAP at night.     All the records are reviewed and case discussed with Care Management/Social Workerr. Management plans discussed with the patient, family and they are in agreement.  CODE STATUS: fc   TOTAL TIME TAKING CARE OF THIS PATIENT: 35  minutes.   POSSIBLE D/C IN 1-2  DAYS, DEPENDING ON CLINICAL CONDITION.  Note: This dictation was prepared with Dragon dictation along with smaller phrase technology. Any transcriptional errors that result from this process are unintentional.   Ramonita Lab M.D on 06/29/2018 at 4:21 PM  Between 7am to 6pm - Pager - [phone] After 6pm go to [email or web address] - password [email or web address]  [place] [facility]  Office  [phone]  CC: Primary care physician; [facility]

## 2018-06-29 NOTE — Progress Notes (Signed)
SATURATION QUALIFICATIONS: (This note is used to comply with regulatory documentation for home oxygen)  Patient Saturations on Room Air at Rest =93  Patient Saturations on Room Air while Ambulating =88  Patient Saturations on 2Liters of oxygen while Ambulating = 92  Please briefly explain why patient needs home oxygen: 

## 2018-06-29 NOTE — Care Management Note (Signed)
Case Management Note  Patient Details  Name: Marchel Kosmicki MRN: [id number] Date of Birth: Aug 07, 1976  Subjective/Objective:     RNCM to room to assess.  Mr. Gimbel lives at home with his wife.  He is independent at home, no services currently.  He is on 3L acute O2.  On RA prior to hospitalization.  [facility]  He also has [place] insurance through his wife.  He is acute on chronic CHF and does not have a scale at home.  Wife states they can buy a scale.  Denies difficulty with affording medications.  No transportation issues identified.  Offered Heart Failure Clinic appointment and gave him the HF Clinic brochure.   He is currently on IV lasix.              Action/Plan:   Expected Discharge Date:                  Expected Discharge Plan:  Home/Self Care  In-House Referral:     Discharge planning Services     Post Acute Care Choice:    Choice offered to:     DME Arranged:    DME Agency:     HH Arranged:    HH Agency:     Status of Service:  In process, will continue to follow  If discussed at Long Length of Stay Meetings, dates discussed:    Additional Comments:  Sherren Kerns, RN 06/29/2018, 5:08 PM

## 2018-06-29 NOTE — Plan of Care (Signed)
  Problem: Education: Goal: Individualized Educational Video(s) Outcome: Progressing   Problem: Education: Goal: Ability to verbalize understanding of medication therapies will improve Outcome: Progressing

## 2018-06-30 ENCOUNTER — Inpatient Hospital Stay: Payer: Managed Care, Other (non HMO)

## 2018-06-30 LAB — GLUCOSE, CAPILLARY
GLUCOSE-CAPILLARY: 150 mg/dL — AB (ref 70–99)
GLUCOSE-CAPILLARY: 216 mg/dL — AB (ref 70–99)
Glucose-Capillary: 80 mg/dL (ref 70–99)
Glucose-Capillary: 129 mg/dL — ABNORMAL HIGH (ref 70–99)

## 2018-06-30 LAB — BASIC METABOLIC PANEL
ANION GAP: 11 (ref 5–15)
BUN: 20 mg/dL (ref 6–20)
CO2: 34 mmol/L — AB (ref 22–32)
Calcium: 9.9 mg/dL (ref 8.9–10.3)
Chloride: 96 mmol/L — ABNORMAL LOW (ref 98–111)
Creatinine, Ser: 1.17 mg/dL (ref 0.61–1.24)
GFR calc Af Amer: >60 mL/min (ref >60)
GFR calc non Af Amer: >60 mL/min (ref >60)
Glucose, Bld: 107 mg/dL — ABNORMAL HIGH (ref 70–99)
Potassium: 4.1 mmol/L (ref 3.5–5.1)
SODIUM: 141 mmol/L (ref 135–145)

## 2018-06-30 LAB — URIC ACID: URIC ACID, SERUM: 10.5 mg/dL — AB (ref 3.7–8.6)

## 2018-06-30 MED ORDER — METHYLPREDNISOLONE SODIUM SUCC 40 MG IJ SOLR
40.0000 mg | Freq: Once | INTRAMUSCULAR | Status: AC
  Administered 2018-06-30: 40 mg via INTRAVENOUS
  Filled 2018-06-30: qty 1

## 2018-06-30 MED ORDER — FUROSEMIDE 10 MG/ML IJ SOLN
20.0000 mg | Freq: Two times a day (BID) | INTRAMUSCULAR | Status: DC
  Administered 2018-06-30 – 2018-07-01 (×2): 20 mg via INTRAVENOUS
  Filled 2018-06-30 (×2): qty 2

## 2018-06-30 MED ORDER — SODIUM CHLORIDE 0.9% FLUSH
3.0000 mL | Freq: Two times a day (BID) | INTRAVENOUS | Status: DC
  Administered 2018-06-30 – 2018-07-01 (×2): 3 mL via INTRAVENOUS

## 2018-06-30 MED ORDER — PREDNISONE 50 MG PO TABS: 50.0000 mg | ORAL_TABLET | Freq: Every day | ORAL | Status: DC

## 2018-06-30 NOTE — Progress Notes (Signed)
Patient presently sitting in the chair, attempted to ambulate with patient using a walker, but patient was unable to take any steps due to left ankle pain MD notified, Xray was order to R/O fx , will continue to monitor

## 2018-06-30 NOTE — Care Management (Signed)
Patient in chair with legs elevated.  States his left ankle is very sore and he cannot walk.  States, "I'm not sure what's wrong with my ankle".  Spoke with Dr. Amado Coe to ask for PT order.  She states it is from his gout.  Per MD will wait for PT order until possibly tomorrow to see if pain is reduced.

## 2018-06-30 NOTE — Progress Notes (Signed)
[facility] [place] at [facility]   PATIENT NAME: Charles Rangel    MR#:  [id number]  DATE OF BIRTH:  06/17/1976  SUBJECTIVE:  CHIEF COMPLAINT: Patient shortness of breath is better , reporting severe left ankle pain, unable to walk on 2 L of oxygen.  REVIEW OF SYSTEMS:  CONSTITUTIONAL: No fever, fatigue or weakness.  EYES: No blurred or double vision.  EARS, NOSE, AND THROAT: No tinnitus or ear pain.  RESPIRATORY: No cough, improving shortness of breath, denies wheezing or hemoptysis.  CARDIOVASCULAR: No chest pain, orthopnea, edema.  GASTROINTESTINAL: No nausea, vomiting, diarrhea or abdominal pain.  GENITOURINARY: No dysuria, hematuria.  ENDOCRINE: No polyuria, nocturia,  HEMATOLOGY: No anemia, easy bruising or bleeding SKIN: No rash or lesion. MUSCULOSKELETAL: No joint pain or arthritis.   NEUROLOGIC: No tingling, numbness, weakness.  PSYCHIATRY: No anxiety or depression.   DRUG ALLERGIES:   Allergies  Allergen Reactions  . Tylenol [Acetaminophen] Anaphylaxis    VITALS:  Blood pressure (!) 141/93, pulse 72, temperature 97.9 F (36.6 C), resp. rate 18, height 5\' 6"  (1.676 m), weight (!) 147.1 kg, SpO2 99 %.  PHYSICAL EXAMINATION:  GENERAL:  42 y.o.-year-old patient lying in the bed with no acute distress.  EYES: Pupils equal, round, reactive to light and accommodation. No scleral icterus. Extraocular muscles intact.  HEENT: Head atraumatic, normocephalic. Oropharynx and nasopharynx clear.  NECK:  Supple, no jugular venous distention. No thyroid enlargement, no tenderness.  LUNGS: Moderate breath sounds bilaterally, no wheezing, rales,rhonchi or crepitation. No use of accessory muscles of respiration.  CARDIOVASCULAR: S1, S2 normal. No murmurs, rubs, or gallops.  ABDOMEN: Soft, nontender, nondistended. Bowel sounds present. No organomegaly or mass.  EXTREMITIES: Left ankle is tender, edematous no pedal edema, cyanosis, or clubbing.   NEUROLOGIC: Cranial nerves II through XII are intact. Muscle strength 5/5 in all extremities. Sensation intact. Gait not checked.  PSYCHIATRIC: The patient is alert and oriented x 3.  SKIN: No obvious rash, lesion, or ulcer.    LABORATORY PANEL:   CBC Recent Labs  Lab 06/29/18 0539  WBC 5.3  HGB 13.4  HCT 40.9  PLT 213   ------------------------------------------------------------------------------------------------------------------  Chemistries  Recent Labs  Lab 06/30/18 0403  NA 141  K 4.1  CL 96*  CO2 34*  GLUCOSE 107*  BUN 20  CREATININE 1.17  CALCIUM 9.9   ------------------------------------------------------------------------------------------------------------------  Cardiac Enzymes Recent Labs  Lab 06/28/18 1746  TROPONINI <0.03   ------------------------------------------------------------------------------------------------------------------  RADIOLOGY:  Dg Chest 2 View  Result Date: 06/28/2018 CLINICAL DATA:  42 year old male with history of shortness of breath for 1 week and nonproductive cough. History of hypertension and congestive heart failure. EXAM: CHEST - 2 VIEW COMPARISON:  Chest x-ray 03/07/2018. FINDINGS: There is cephalization of the pulmonary vasculature, indistinctness of the interstitial markings, and patchy airspace disease throughout the lungs bilaterally suggestive of moderate pulmonary edema. No pleural effusions. Mild cardiomegaly. Upper mediastinal contours are within normal limits. IMPRESSION: 1. The appearance the chest suggests congestive heart failure, as above. Electronically Signed   By: Trudie Reed M.D.   On: 06/28/2018 19:13   Dg Ankle 2 Views Left  Result Date: 06/30/2018 CLINICAL DATA:  Left foot and ankle pain, history of gout, initial encounter EXAM: LEFT ANKLE - 2 VIEW COMPARISON:  None. FINDINGS: Generalized soft tissue swelling is noted. No acute fracture or dislocation is seen. IMPRESSION: Soft tissue swelling  without acute bony abnormality. Electronically Signed   By: Alcide Clever M.D.   On:  06/30/2018 12:21   Dg Foot 2 Views Left  Result Date: 06/30/2018 CLINICAL DATA:  Left foot pain, history of gout EXAM: LEFT FOOT - 2 VIEW COMPARISON:  None. FINDINGS: Generalized soft tissue swelling is noted. No acute fracture or dislocation is seen. No soft tissue calcifications are noted. IMPRESSION: Soft tissue swelling without acute bony abnormality. Electronically Signed   By: Alcide Clever M.D.   On: 06/30/2018 12:21    EKG:   Orders placed or performed during the hospital encounter of 06/28/18  . EKG 12-Lead  . EKG 12-Lead  . ED EKG  . ED EKG    ASSESSMENT AND PLAN:    *Acute hypoxic respiratory failure secondary to acute on chronic diastolic CHF Currently on oxygen via nasal cannula wean off as tolerated  *Acute goutY attack of the left ankle Uric acid elevated Patient is started on steroids Tramadol as needed Diet education and gout   *Acute on chronic diastolic congestive heart failure Continue IV Lasix, reduce the frequency to every 12 hours from q. 8  monitor intake and output  monitor daily weight, fluid restriction. Outpatient CHF vest Continue home medication aspirin, Coreg  *Diabetes Hold metformin and give insulin sliding scale coverage.  *Hypertension Continue home medications lisinopril, Coreg and titrate as needed  *Obesity Lifestyle modification advised  *Obstructive sleep apnea Continue CPAP at night.     All the records are reviewed and case discussed with Care Management/Social Workerr. Management plans discussed with the patient, family and they are in agreement.  CODE STATUS: fc   TOTAL TIME TAKING CARE OF THIS PATIENT: 35  minutes.   POSSIBLE D/C IN 1-2  DAYS, DEPENDING ON CLINICAL CONDITION.  Note: This dictation was prepared with Dragon dictation along with smaller phrase technology. Any transcriptional errors that result from this process  are unintentional.   Ramonita Lab M.D on 06/30/2018 at 3:58 PM  Between 7am to 6pm - Pager - [phone] After 6pm go to [email or web address] - password [email or web address] [facility]  [facility]  Office  [phone]  CC: Primary care physician; [facility]

## 2018-06-30 NOTE — Plan of Care (Signed)
Patient unable to ambulate independently with device due to gout in left ankle. Patient is using the incentive spirometer, witnessed by nurse. Patient prefers to sleep in recliner.

## 2018-06-30 NOTE — Plan of Care (Signed)
  Problem: Pain Managment: Goal: General experience of comfort will improve Outcome: Progressing   Problem: Activity: Goal: Capacity to carry out activities will improve Outcome: Progressing   Problem: Education: Goal: Ability to demonstrate management of disease process will improve Outcome: Progressing

## 2018-06-30 NOTE — Plan of Care (Signed)
Nutrition Education Note  RD consulted for nutrition education regarding gout.  Spoke with pt at bedside who reports typically eating 3 meals daily and drinking water throughout the day. Pt states that he drinks juice and soda but not regularly.  Breakfast: Herbalife protein shake Lunch: Herbalife protein shake or egg burrito and grilled chicken burrito with extra guacamole from [employer]: burger  Provided pt with Low-Purine/Purine-Restricted Nutrition Therapy handout from the Academy of Nutrition and Dietetics.  Discussed the link between purines in food, uric acid in blood, and gout flares. Provided a list of foods recommended that are low in purines and moderate in purines along with recommended serving sizes. Also provided a list of foods not recommended that are high in purines including gravies, sauces made with meats, anchovies, sardines, tuna, bacon, and organ meats. Discussed role of alcohol in gout flares. Pt states that he has not consumed alcohol in over 1 year.  Pt shared with RD that he ate meatloaf with gravy and a large portion of beans a few days ago. Pt believes that this is why he is having a gout flare. Teach back method used.  Expect good compliance.  Body mass index is 52.34 kg/m. Pt meets criteria for morbid obesity based on current BMI.  Current diet order is Heart Healthy/Carb Modified, patient is consuming approximately 100% of meals at this time. Labs and medications reviewed. No further nutrition interventions warranted at this time. RD contact information provided. If additional nutrition issues arise, please re-consult RD.   Earma Reading, MS, RD, LDN Pager: [phone] Weekend/After Hours: [phone]

## 2018-07-01 LAB — GLUCOSE, CAPILLARY
GLUCOSE-CAPILLARY: 104 mg/dL — AB (ref 70–99)
Glucose-Capillary: 144 mg/dL — ABNORMAL HIGH (ref 70–99)
Glucose-Capillary: 106 mg/dL — ABNORMAL HIGH (ref 70–99)

## 2018-07-01 LAB — BASIC METABOLIC PANEL
Anion gap: 6 (ref 5–15)
BUN: 22 mg/dL — ABNORMAL HIGH (ref 6–20)
CO2: 36 mmol/L — ABNORMAL HIGH (ref 22–32)
CREATININE: 1.14 mg/dL (ref 0.61–1.24)
Calcium: 10.2 mg/dL (ref 8.9–10.3)
Chloride: 97 mmol/L — ABNORMAL LOW (ref 98–111)
GFR calc non Af Amer: >60 mL/min (ref >60)
Glucose, Bld: 130 mg/dL — ABNORMAL HIGH (ref 70–99)
Potassium: 4.1 mmol/L (ref 3.5–5.1)
Sodium: 139 mmol/L (ref 135–145)

## 2018-07-01 MED ORDER — PREDNISONE 10 MG PO TABS: 10.0000 mg | ORAL_TABLET | Freq: Two times a day (BID) | ORAL | 0 refills | Status: AC

## 2018-07-01 MED ORDER — FUROSEMIDE 40 MG PO TABS: 40.0000 mg | ORAL_TABLET | Freq: Two times a day (BID) | ORAL | 0 refills | Status: DC

## 2018-07-01 MED ORDER — COLCHICINE 0.6 MG PO TABS
0.6000 mg | ORAL_TABLET | Freq: Every day | ORAL | Status: DC
  Administered 2018-07-01: 0.6 mg via ORAL
  Filled 2018-07-01: qty 1

## 2018-07-01 MED ORDER — TRAMADOL HCL 50 MG PO TABS: 50.0000 mg | ORAL_TABLET | Freq: Four times a day (QID) | ORAL | 0 refills | Status: DC | PRN

## 2018-07-01 MED ORDER — LISINOPRIL 5 MG PO TABS: 5.0000 mg | ORAL_TABLET | Freq: Every day | ORAL | 0 refills | Status: DC

## 2018-07-01 MED ORDER — COLCHICINE 0.6 MG PO TABS: 0.6000 mg | ORAL_TABLET | Freq: Every day | ORAL | 0 refills | Status: DC

## 2018-07-01 MED ORDER — FUROSEMIDE 40 MG PO TABS
40.0000 mg | ORAL_TABLET | Freq: Two times a day (BID) | ORAL | Status: DC
  Administered 2018-07-01: 40 mg via ORAL
  Filled 2018-07-01: qty 1

## 2018-07-01 MED ORDER — DOCUSATE SODIUM 100 MG PO CAPS: 100.0000 mg | ORAL_CAPSULE | Freq: Two times a day (BID) | ORAL | 0 refills | Status: DC | PRN

## 2018-07-01 MED ORDER — POTASSIUM CHLORIDE ER 20 MEQ PO TBCR: 20.0000 meq | EXTENDED_RELEASE_TABLET | Freq: Every day | ORAL | 0 refills | Status: DC

## 2018-07-01 NOTE — Care Management Note (Signed)
Case Management Note  Patient Details  Name: Charles Rangel MRN: [id number] Date of Birth: 31-Dec-1975  Subjective/Objective:   Order for bariatric rolling walker in placed. Walker provided by [facility] to patient room.                      Action/Plan:   Expected Discharge Date:  07/01/18               Expected Discharge Plan:  Home/Self Care  In-House Referral:     Discharge planning Services  CM Consult, HF Clinic  Post Acute Care Choice:  Home Health Choice offered to:  NA([employer] insured; determined by insurance.)  DME Arranged:    DME Agency:     HH Arranged:  RN, PT HH Agency:     Status of Service:  Completed, signed off  If discussed at Long Length of Stay Meetings, dates discussed:    Additional Comments:  Sherren KernsJennifer L Zinnia Tindall, RN 07/01/2018, 4:59 PM

## 2018-07-01 NOTE — Progress Notes (Signed)
SATURATION QUALIFICATIONS: (This note is used to comply with regulatory documentation for home oxygen)  Patient Saturations on Room Air at Rest = 96%  Patient Saturations on Room Air while Ambulating = 93%  

## 2018-07-01 NOTE — Discharge Summary (Addendum)
[facility] - [place] at [facility]   PATIENT NAME: Charles Rangel    MR#:  [id number]  DATE OF BIRTH:  [birth year]  DATE OF ADMISSION:  06/28/2018 ADMITTING PHYSICIAN: Altamese Dilling, MD  DATE OF DISCHARGE:  07/01/18  PRIMARY CARE PHYSICIAN: [facility]    ADMISSION DIAGNOSIS:  Hypoxia [R09.02] Acute on chronic congestive heart failure, unspecified heart failure type (HCC) [I50.9]  DISCHARGE DIAGNOSIS:  Principal Problem:   Acute on chronic diastolic CHF (congestive heart failure) (HCC) Active Problems:   Acute respiratory failure with hypoxia (HCC)   SECONDARY DIAGNOSIS:   Past Medical History:  Diagnosis Date  . CHF (congestive heart failure) (HCC)   . COPD (chronic obstructive pulmonary disease) (HCC)   . Diabetes mellitus without complication (HCC)   . DVT (deep vein thrombosis) in pregnancy (HCC)   . HTN (hypertension)   . Hypertension   . OSA (obstructive sleep apnea)     HOSPITAL COURSE:     *Acute hypoxic respiratory failure secondary to acute on chronic diastolic CHF Currently on oxygen via nasal cannula wean off as tolerated  *Acute goutY attack of the left ankle Uric acid elevated Patient is started on steroids, clinically better Taper steroids colchicine added to the regimen once daily , PCP can titrate it as needed Tramadol as needed Diet education and gout   *Acute on chronic diastolic congestive heart failure Continue Lasix dose increased to 40 mg p.o. twice daily will discharge with potassium supplements  monitor intake and output  monitor daily weight, fluid restriction. Outpatient CHF vest/CHF clinic Continue home medication aspirin, Coreg  *Diabetes Hold metformin and give insulin sliding scale coverage.  *Hypertension Continue home medications lisinopril, Coreg and titrate as needed  *Obesity Lifestyle modification advised  *Obstructive sleep apnea Continue CPAP at  night.   DISCHARGE CONDITIONS:  fair  CONSULTS OBTAINED:     PROCEDURES  None   DRUG ALLERGIES:   Allergies  Allergen Reactions  . Tylenol [Acetaminophen] Anaphylaxis    DISCHARGE MEDICATIONS:   Allergies as of 07/01/2018      Reactions   Tylenol [acetaminophen] Anaphylaxis      Medication List    TAKE these medications   aspirin EC 81 MG tablet Take 81 mg daily by mouth.   carvedilol 25 MG tablet Commonly known as:  COREG Take 1 tablet by mouth 2 (two) times daily with a meal.   colchicine 0.6 MG tablet Take 1 tablet (0.6 mg total) by mouth daily.   cyclobenzaprine 10 MG tablet Commonly known as:  FLEXERIL Take 1 tablet by mouth 3 (three) times daily as needed.   docusate sodium 100 MG capsule Commonly known as:  COLACE Take 1 capsule (100 mg total) by mouth 2 (two) times daily as needed for mild constipation.   furosemide 40 MG tablet Commonly known as:  LASIX Take 1 tablet (40 mg total) by mouth 2 (two) times daily. What changed:    medication strength  how much to take   lisinopril 5 MG tablet Commonly known as:  PRINIVIL,ZESTRIL Take 1 tablet (5 mg total) by mouth daily. Start taking on:  07/02/2018 What changed:    how much to take  when to take this   metFORMIN 1000 MG tablet Commonly known as:  GLUCOPHAGE Take 1,000 mg by mouth 2 (two) times daily with a meal.   Potassium Chloride ER 20 MEQ Tbcr Take 20 mEq by mouth daily.   predniSONE 10 MG tablet Commonly known as:  DELTASONE Take 1 tablet (10 mg total) by mouth 2 (two) times daily with a meal for 3 days. Prednisone 10 mg twice a day from 07/02/2018 to 07/04/2018 for 3 days followed by 10 mg once daily for 4 days What changed:    how much to take  how to take this  when to take this  additional instructions   traMADol 50 MG tablet Commonly known as:  ULTRAM Take 1 tablet (50 mg total) by mouth every 6 (six) hours as needed.   Vitamin D3 1000 units Caps Take 1 capsule  by mouth daily.            Durable Medical Equipment  (From admission, onward)         Start     Ordered   07/01/18 1432  For home use only DME Walker rolling  Once    Comments:  Bariatric walker,rolling walker with 5" wheels ,3 in 1  Question:  Patient needs a walker to treat with the following condition  Answer:  Gout attack   07/01/18 1431           DISCHARGE INSTRUCTIONS:   Follow-up with primary care physician in 3 days Follow-up with cardiology Dr. Mariah Milling in 1 to 2 weeks Outpatient CHF clinic in 2 to 3 days Monitor intake and output and daily weights  DIET:  Cardiac diet and Diabetic diet  DISCHARGE CONDITION:  Fair  ACTIVITY:  Activity as tolerated walker and home health PT  OXYGEN:  Home Oxygen: No.   Oxygen Delivery: room air  DISCHARGE LOCATION:  home   If you experience worsening of your admission symptoms, develop shortness of breath, life threatening emergency, suicidal or homicidal thoughts you must seek medical attention immediately by calling 911 or calling your MD immediately  if symptoms less severe.  You Must read complete instructions/literature along with all the possible adverse reactions/side effects for all the Medicines you take and that have been prescribed to you. Take any new Medicines after you have completely understood and accpet all the possible adverse reactions/side effects.   Please note  You were cared for by a hospitalist during your hospital stay. If you have any questions about your discharge medications or the care you received while you were in the hospital after you are discharged, you can call the unit and asked to speak with the hospitalist on call if the hospitalist that took care of you is not available. Once you are discharged, your primary care physician will handle any further medical issues. Please note that NO REFILLS for any discharge medications will be authorized once you are discharged, as it is imperative  that you return to your primary care physician (or establish a relationship with a primary care physician if you do not have one) for your aftercare needs so that they can reassess your need for medications and monitor your lab values.     Today  Chief Complaint  Patient presents with  . Shortness of Breath    Patient's left ankle pain significantly improved swelling is also getting better.  Walking with the help of walker.  Seen by physical therapy.  Wants to go home with home PT.  Shortness of breath significantly improved  ROS:  CONSTITUTIONAL: Denies fevers, chills. Denies any fatigue, weakness.  EYES: Denies blurry vision, double vision, eye pain. EARS, NOSE, THROAT: Denies tinnitus, ear pain, hearing loss. RESPIRATORY: Denies cough, wheeze, shortness of breath.  CARDIOVASCULAR: Denies chest pain, palpitations, edema.  GASTROINTESTINAL: Denies  nausea, vomiting, diarrhea, abdominal pain. Denies bright red blood per rectum. GENITOURINARY: Denies dysuria, hematuria. ENDOCRINE: Denies nocturia or thyroid problems. HEMATOLOGIC AND LYMPHATIC: Denies easy bruising or bleeding. SKIN: Denies rash or lesion. MUSCULOSKELETAL: Denies pain in neck, back, shoulder, knees, hips or arthritic symptoms.  NEUROLOGIC: Denies paralysis, paresthesias.  PSYCHIATRIC: Denies anxiety or depressive symptoms.   VITAL SIGNS:  Blood pressure (!) 154/108, pulse 98, temperature 98.7 F (37.1 C), temperature source Oral, resp. rate 20, height 5\' 6"  (1.676 m), weight (!) 146.8 kg, SpO2 97 %.  I/O:    Intake/Output Summary (Last 24 hours) at 07/01/2018 1618 Last data filed at 07/01/2018 1400 Gross per 24 hour  Intake 240 ml  Output 2000 ml  Net -1760 ml    PHYSICAL EXAMINATION:  GENERAL:  42 y.o.-year-old patient lying in the bed with no acute distress.  EYES: Pupils equal, round, reactive to light and accommodation. No scleral icterus. Extraocular muscles intact.  HEENT: Head atraumatic,  normocephalic. Oropharynx and nasopharynx clear.  NECK:  Supple, no jugular venous distention. No thyroid enlargement, no tenderness.  LUNGS: Normal breath sounds bilaterally, no wheezing, rales,rhonchi or crepitation. No use of accessory muscles of respiration.  CARDIOVASCULAR: S1, S2 normal. No murmurs, rubs, or gallops.  ABDOMEN: Soft, non-tender, non-distended. Bowel sounds present. No organomegaly or mass.  EXTREMITIES: The left ankle is minimally tender no pedal edema, cyanosis, or clubbing.  NEUROLOGIC: Cranial nerves II through XII are intact.  Sensation intact. Gait not checked.  PSYCHIATRIC: The patient is alert and oriented x 3.  SKIN: No obvious rash, lesion, or ulcer.   DATA REVIEW:   CBC Recent Labs  Lab 06/29/18 0539  WBC 5.3  HGB 13.4  HCT 40.9  PLT 213    Chemistries  Recent Labs  Lab 07/01/18 0442  NA 139  K 4.1  CL 97*  CO2 36*  GLUCOSE 130*  BUN 22*  CREATININE 1.14  CALCIUM 10.2    Cardiac Enzymes Recent Labs  Lab 06/28/18 1746  TROPONINI <0.03    Microbiology Results  Results for orders placed or performed during the hospital encounter of 03/04/18  Blood culture (routine x 2)     Status: None   Collection Time: 03/04/18  4:54 PM  Result Value Ref Range Status   Specimen Description BLOOD BLOOD RIGHT HAND  Final   Special Requests Blood Culture adequate volume  Final   Culture   Final    NO GROWTH 5 DAYS Performed at [facility], [address] [place], [place] [address]    Report Status 03/09/2018 FINAL  Final  Blood culture (routine x 2)     Status: None   Collection Time: 03/04/18  4:59 PM  Result Value Ref Range Status   Specimen Description BLOOD BLOOD LEFT ARM  Final   Special Requests Blood Culture adequate volume  Final   Culture   Final    NO GROWTH 5 DAYS Performed at [facility], [address] [place], [place] [address]    Report Status 03/09/2018 FINAL  Final  MRSA PCR Screening     Status: None    Collection Time: 03/04/18  8:02 PM  Result Value Ref Range Status   MRSA by PCR NEGATIVE NEGATIVE Final    Comment:        The GeneXpert MRSA Assay (FDA approved for NASAL specimens only), is one component of a comprehensive MRSA colonization surveillance program. It is not intended to diagnose MRSA infection nor to guide or  monitor treatment for MRSA infections. Performed at [facility], [address] [place] [place], [place] [address]     RADIOLOGY:  Dg Chest 2 View  Result Date: 06/28/2018 CLINICAL DATA:  42 year old male with history of shortness of breath for 1 week and nonproductive cough. History of hypertension and congestive heart failure. EXAM: CHEST - 2 VIEW COMPARISON:  Chest x-ray 03/07/2018. FINDINGS: There is cephalization of the pulmonary vasculature, indistinctness of the interstitial markings, and patchy airspace disease throughout the lungs bilaterally suggestive of moderate pulmonary edema. No pleural effusions. Mild cardiomegaly. Upper mediastinal contours are within normal limits. IMPRESSION: 1. The appearance the chest suggests congestive heart failure, as above. Electronically Signed   By: Trudie Reed M.D.   On: 06/28/2018 19:13   Dg Ankle 2 Views Left  Result Date: 06/30/2018 CLINICAL DATA:  Left foot and ankle pain, history of gout, initial encounter EXAM: LEFT ANKLE - 2 VIEW COMPARISON:  None. FINDINGS: Generalized soft tissue swelling is noted. No acute fracture or dislocation is seen. IMPRESSION: Soft tissue swelling without acute bony abnormality. Electronically Signed   By: Alcide Clever M.D.   On: 06/30/2018 12:21   Dg Foot 2 Views Left  Result Date: 06/30/2018 CLINICAL DATA:  Left foot pain, history of gout EXAM: LEFT FOOT - 2 VIEW COMPARISON:  None. FINDINGS: Generalized soft tissue swelling is noted. No acute fracture or dislocation is seen. No soft tissue calcifications are noted. IMPRESSION: Soft tissue swelling without acute bony  abnormality. Electronically Signed   By: Alcide Clever M.D.   On: 06/30/2018 12:21    EKG:   Orders placed or performed during the hospital encounter of 06/28/18  . EKG 12-Lead  . EKG 12-Lead  . ED EKG  . ED EKG      Management plans discussed with the patient, family and they are in agreement.  CODE STATUS:     Code Status Orders  (From admission, onward)         Start     Ordered   06/28/18 2216  Full code  Continuous     06/28/18 2215        Code Status History    Date Active Date Inactive Code Status Order ID Comments User Context   03/04/2018 1928 03/07/2018 1730 Full Code [id number]  Shaune Pollack, MD ED      TOTAL TIME TAKING CARE OF THIS PATIENT: 43  minutes.   Note: This dictation was prepared with Dragon dictation along with smaller phrase technology. Any transcriptional errors that result from this process are unintentional.   @[facility] @  on 07/01/2018 at 4:18 PM  Between 7am to 6pm - Pager - [phone]  After 6pm go to [email or web address] - password [email or web address] [facility]  [facility]  Office  [phone]  CC: Primary care physician; [facility]

## 2018-07-01 NOTE — Evaluation (Signed)
Physical Therapy Evaluation Patient Details Name: Charles Rangel MRN: [id number] DOB: [birth year] Today's Date: 07/01/2018   History of Present Illness  Charles Rangel  is a 42 y.o. male arriving with shortness of breath and severe ankle pain secondary to gout. Pt highly limited with ambulation attempts since arrival due to pain in ankle. Emergency room noted to have pulmonary edema on chest x-ray and requiring supplemental oxygen. Pt is currently not working and lives at home with his wife in a single floor home. PMH significant for CHF, COPD, diabetes, hypertension, obstructive sleep apnea uses CPAP at night but no supplemental oxygen during the day.   Clinical Impression  Pt was independent with all functional mobility prior to hospital stay, but is currently limited by pain secondary to gout.  Pt originally admitted for shortness of breath secondary to CHF, but reports no shortness of breath at this time. Pt lives with his wife in a single story home with no stairs and no use of AD [facility] has a [facility] flare up. Pt demonstrated independent with sit to stand transfer with only CGA needed. During ambulation pt limited by pain and fatigue following 40 feet. O2sat at 93 and HR 110s during activity. Pt able to ambulate an additional 40 feet back to room following a few minutes of rest in recliner. Currently pt requires greater support from a walker compared to baseline and would benefit from skilled HHPT to improve overall functional mobility in home.     Follow Up Recommendations Home health PT    Equipment Recommendations  Rolling walker with 5" wheels;3in1 (PT)(bariatric for width)    Recommendations for Other Services       Precautions / Restrictions Precautions Precautions: Fall Restrictions Weight Bearing Restrictions: No      Mobility  Bed Mobility               General bed mobility comments: Pt in recliner on arrival and returned to recliner  following  Transfers Overall transfer level: Modified independent Equipment used: Rolling walker (2 wheeled)(bariatric for width)             General transfer comment: UE support needed for sit to stand, VC for UE placement on chair rather than walker  Ambulation/Gait Ambulation/Gait assistance: Modified independent (Device/Increase time);Min guard Gait Distance (Feet): 40 Feet(6140feet x 2) Assistive device: Rolling walker (2 wheeled) Gait Pattern/deviations: Step-through pattern;Antalgic     General Gait Details: Self limiting toe touch on L due to pain, VC for WB through L foot as tolerated, increased fatigue noted after 40 feet required seated rest break, improved cadence during last 40 feet, heavy reliance on UE   Stairs            Wheelchair Mobility    Modified Rankin (Stroke Patients Only)       Balance Overall balance assessment: Independent                                           Pertinent Vitals/Pain Pain Assessment: 0-10 Pain Score: 0-No pain(8/10 during ambulation) Pain Location: L foot no pain at rest Pain Intervention(s): Repositioned;Limited activity within patient's tolerance    Home Living Family/patient expects to be discharged to:: Private residence Living Arrangements: Spouse/significant other;Other relatives   Type of Home: House       Home Layout: One level Home Equipment: Gilmer Morane -  single point Additional Comments: Use of cane when gout flare up occurs    Prior Function Level of Independence: Independent with assistive device(s)               Hand Dominance        Extremity/Trunk Assessment   Upper Extremity Assessment Upper Extremity Assessment: Overall WFL for tasks assessed    Lower Extremity Assessment Lower Extremity Assessment: LLE deficits/detail LLE Deficits / Details: Unable to assess LLE ankle strength due to pain, all others [facility]       Communication   Communication: No difficulties   Cognition Arousal/Alertness: Awake/alert Behavior During Therapy: WFL for tasks assessed/performed Overall Cognitive Status: Within Functional Limits for tasks assessed                                        General Comments      Exercises     Assessment/Plan    PT Assessment Patient needs continued PT services  PT Problem List Decreased strength;Decreased activity tolerance;Decreased mobility;Cardiopulmonary status limiting activity;Pain;Obesity       PT Treatment Interventions      PT Goals (Current goals can be found in the Care Plan section)  Acute Rehab PT Goals Patient Stated Goal: Return to independence with ambulation with LRAD PT Goal Formulation: With patient Time For Goal Achievement: 07/15/18 Potential to Achieve Goals: Good    Frequency Min 2X/week   Barriers to discharge        Co-evaluation               AM-PAC PT "6 Clicks" Daily Activity  Outcome Measure Difficulty turning over in bed (including adjusting bedclothes, sheets and blankets)?: None Difficulty moving from lying on back to sitting on the side of the bed? : None Difficulty sitting down on and standing up from a chair with arms (e.g., wheelchair, bedside commode, etc,.)?: A Little Help needed moving to and from a bed to chair (including a wheelchair)?: A Little Help needed walking in hospital room?: A Little Help needed climbing 3-5 steps with a railing? : Total 6 Click Score: 18    End of Session Equipment Utilized During Treatment: Gait belt Activity Tolerance: Patient tolerated treatment well;Patient limited by fatigue;Patient limited by pain Patient left: in chair Nurse Communication: Mobility status PT Visit Diagnosis: Muscle weakness (generalized) (M62.81);Difficulty in walking, not elsewhere classified (R26.2);Other abnormalities of gait and mobility (R26.89)    Time: 1610-9604 PT Time Calculation (min) (ACUTE ONLY): 32 min   Charges:              Mickel Duhamel, SPT 07/01/2018, 1:39 PM

## 2018-07-01 NOTE — Progress Notes (Signed)
Patient discharged via wheelchair and private vehicle. All Discharge instructions given and verbalized understanding. Verbalized that he will weigh himself daily and notify doctor if he gains weight. Prescriptions given to patient. No complaints at this time. Scale to weigh himself given.

## 2018-07-01 NOTE — Discharge Instructions (Signed)
Heart Failure Heart failure means your heart has trouble pumping blood. This makes it hard for your body to work well. Heart failure is usually a long-term (chronic) condition. You must take good care of yourself and follow your doctor's treatment plan. Follow these instructions at home:  Take your heart medicine as told by your doctor. ? Do not stop taking medicine unless your doctor tells you to. ? Do not skip any dose of medicine. ? Refill your medicines before they run out. ? Take other medicines only as told by your doctor or pharmacist.  Stay active if told by your doctor. The elderly and people with severe heart failure should talk with a doctor about physical activity.  Eat heart-healthy foods. Choose foods that are without trans fat and are low in saturated fat, cholesterol, and salt (sodium). This includes fresh or frozen fruits and vegetables, fish, lean meats, fat-free or low-fat dairy foods, whole grains, and high-fiber foods. Lentils and dried peas and beans (legumes) are also good choices.  Limit salt if told by your doctor.  Cook in a healthy way. Roast, grill, broil, bake, poach, steam, or stir-fry foods.  Limit fluids as told by your doctor.  Weigh yourself every morning. Do this after you pee (urinate) and before you eat breakfast. Write down your weight to give to your doctor.  Take your blood pressure and write it down if your doctor tells you to.  Ask your doctor how to check your pulse. Check your pulse as told.  Lose weight if told by your doctor.  Stop smoking or chewing tobacco. Do not use gum or patches that help you quit without your doctor's approval.  Schedule and go to doctor visits as told.  Nonpregnant women should have no more than 1 drink a day. Men should have no more than 2 drinks a day. Talk to your doctor about drinking alcohol.  Stop illegal drug use.  Stay current with shots (immunizations).  Manage your health conditions as told by  your doctor.  Learn to manage your stress.  Rest when you are tired.  If it is really hot outside: ? Avoid intense activities. ? Use air conditioning or fans, or get in a cooler place. ? Avoid caffeine and alcohol. ? Wear loose-fitting, lightweight, and light-colored clothing.  If it is really cold outside: ? Avoid intense activities. ? Layer your clothing. ? Wear mittens or gloves, a hat, and a scarf when going outside. ? Avoid alcohol.  Learn about heart failure and get support as needed.  Get help to maintain or improve your quality of life and your ability to care for yourself as needed. Contact a doctor if:  You gain weight quickly.  You are more short of breath than usual.  You cannot do your normal activities.  You tire easily.  You cough more than normal, especially with activity.  You have any or more puffiness (swelling) in areas such as your hands, feet, ankles, or belly (abdomen).  You cannot sleep because it is hard to breathe.  You feel like your heart is beating fast (palpitations).  You get dizzy or light-headed when you stand up. Get help right away if:  You have trouble breathing.  There is a change in mental status, such as becoming less alert or not being able to focus.  You have chest pain or discomfort.  You faint. This information is not intended to replace advice given to you by your health care provider. Make sure  you discuss any questions you have with your health care provider. Document Released: 07/15/2008 Document Revised: 03/13/2016 Document Reviewed: 11/22/2012 Elsevier Interactive Patient Education  2017 [employer]   Heart Failure Eating Plan Heart failure, also called congestive heart failure, occurs when your heart does not pump blood well enough to meet your body's needs for oxygen-rich blood. Heart failure is a long-term (chronic) condition. Living with heart failure can be challenging. However, following your health care  provider's instructions about a healthy lifestyle and working with a diet and nutrition specialist (dietitian) to choose the right foods may help to improve your symptoms. What are tips for following this plan? General guidelines  Do not eat more than 2,300 mg of salt (sodium) a day. The amount of sodium that is recommended for you may be lower, depending on your condition.  Maintain a healthy body weight as directed. Ask your health care provider what a healthy weight is for you. ? Check your weight every day. ? Work with your health care provider and dietitian to make a plan that is right for you to lose weight or maintain your current weight.  Limit how much fluid you drink. Ask your health care provider or dietitian how much fluid you can have each day.  Limit or avoid alcohol as told by your health care provider or dietitian. Reading food labels  Check food labels for the amount of sodium per serving. Choose foods that have less than 140 mg (milligrams) of sodium in each serving.  Check food labels for the number of calories per serving. This is important if you need to limit your daily calorie intake to lose weight.  Check food labels for the serving size. If you eat more than one serving, you will be eating more sodium and calories than what is listed on the label.  Look for foods that are labeled as "sodium-free," "very low sodium," or "low sodium." ? Foods labeled as "reduced sodium" or "lightly salted" may still have more sodium than what is recommended for you. Cooking  Avoid adding salt when cooking. Ask your health care provider or dietitian before using salt substitutes.  Season food with salt-free seasonings, spices, or herbs. Check the label of seasoning mixes to make sure they do not contain salt.  Cook with heart-healthy oils, such as olive, canola, soybean, or sunflower oil.  Do not fry foods. Cook foods using low-fat methods, such as baking, boiling, grilling, and  broiling.  Limit unhealthy fats when cooking by: ? Removing the skin from poultry, such as chicken. ? Removing all visible fats from meats. ? Skimming the fat off from stews, soups, and gravies before serving them. Meal planning  Limit your intake of: ? Processed, canned, or pre-packaged foods. ? Foods that are high in trans fat, such as fried foods. ? Sweets, desserts, sugary drinks, and other foods with added sugar. ? Full-fat dairy products, such as whole milk.  Eat a balanced diet that includes: ? 4-5 servings of fruit each day and 4-5 servings of vegetables each day. At each meal, try to fill half of your plate with fruits and vegetables. ? Up to 6-8 servings of whole grains each day. ? Up to 2 servings of lean meat, poultry, or fish each day. One serving of meat is equal to 3 oz. This is about the same size as a deck of cards. ? 2 servings of low-fat dairy each day. ? Heart-healthy fats. Healthy fats called omega-3 fatty acids are found in  foods such as flaxseed and cold-water fish like sardines, salmon, and mackerel.  Aim to eat 25-35 g (grams) of fiber a day. Foods that are high in fiber include apples, broccoli, carrots, beans, peas, and whole grains.  Do not add salt or condiments that contain salt (such as soy sauce) to foods before eating.  When eating at a restaurant, ask that your food be prepared with less salt or no salt, if possible.  Try to eat 2 or more vegetarian meals each week.  Eat more home-cooked food and eat less restaurant, buffet, and fast food. Recommended foods The items listed may not be a complete list. Talk with your dietitian about what dietary choices are best for you. Grains Bread with less than 80 mg of sodium per slice. Whole-wheat pasta, quinoa, and brown rice. Oats and oatmeal. Barley. Millet. Grits and cream of wheat. Whole-grain and whole-wheat cold cereal. Vegetables All fresh vegetables. Vegetables that are frozen without sauce or added  salt. Low-sodium or sodium-free canned vegetables. Fruits All fresh, frozen, and canned fruits. Dried fruits, such as raisins, prunes, and cranberries. Meats and other protein foods Lean cuts of meat. Skinless chicken and [place]. Fish with high omega-3 fatty acids, such as salmon, sardines, and other cold-water fishes. Eggs. Dried beans, peas, and edamame. Unsalted nuts and nut butters. Dairy Low-fat or nonfat (skim) milk and dried milk. Rice milk, soy milk, and almond milk. Low-fat or nonfat yogurt. Small amounts of reduced-sodium block cheese. Low-sodium cottage cheese. Fats and oils Olive, canola, soybean, flaxseed, or sunflower oil. Avocado. Sweets and desserts Apple sauce. Granola bars. Sugar-free pudding and gelatin. Frozen fruit bars. Seasoning and other foods Fresh and dried herbs. Lemon or lime juice. Vinegar. Low-sodium ketchup. Salt-free marinades, salad dressings, sauces, and seasonings. Foods to avoid The items listed may not be a complete list. Talk with your dietitian about what dietary choices are best for you. Grains Bread with more than 80 mg of sodium per slice. Hot or cold cereal with more than 140 mg sodium per serving. Salted pretzels and crackers. Pre-packaged breadcrumbs. Bagels, croissants, and biscuits. Vegetables Canned vegetables. Frozen vegetables with sauce or seasonings. Creamed vegetables. [place] fries. Onion rings. Pickled vegetables and sauerkraut. Fruits Fruits that are dried with sodium-containing preservatives. Meats and other protein foods Ribs and chicken wings. Bacon, ham, pepperoni, bologna, salami, and packaged luncheon meats. Hot dogs, bratwurst, and sausage. Canned meat. Smoked meat and fish. Salted nuts and seeds. Dairy Whole milk, half-and-half, and cream. Buttermilk. Processed cheese, cheese spreads, and cheese curds. Regular cottage cheese. Feta cheese. Shredded cheese. String cheese. Fats and oils Butter, lard, shortening, ghee, and bacon  fat. Canned and packaged gravies. Seasoning and other foods Onion salt, garlic salt, table salt, and sea salt. Marinades. Regular salad dressings. Relishes, pickles, and olives. Meat flavorings and tenderizers, and bouillon cubes. Horseradish, ketchup, and mustard. Worcestershire sauce. Teriyaki sauce, soy sauce (including reduced sodium). Hot sauce and Tabasco sauce. Steak sauce, fish sauce, oyster sauce, and cocktail sauce. Taco seasonings. Barbecue sauce. Tartar sauce. Summary  A heart failure eating plan includes changes that limit your intake of sodium and unhealthy fat, and it may help you lose weight or maintain a healthy weight. Your health care provider may also recommend limiting how much fluid you drink.  Most people with heart failure should eat no more than 2,300 mg of salt (sodium) a day. The amount of sodium that is recommended for you may be lower, depending on your condition.  Contact your health  care provider or dietitian before making any major changes to your diet. This information is not intended to replace advice given to you by your health care provider. Make sure you discuss any questions you have with your health care provider. Document Released: 02/20/2017 Document Revised: 02/20/2017 Document Reviewed: 02/20/2017 Elsevier Interactive Patient Education  2018 Elsevier Inc.   Living With Heart Failure  Heart failure is a long-term (chronic) condition in which the heart cannot pump enough blood through the body. When this happens, parts of the body do not get the blood and oxygen they need. There is no cure for heart failure at this time, so it is important for you to take good care of yourself and follow the treatment plan set by your health care provider. If you are living with heart failure, there are ways to help you manage the disease. Follow these instructions at home: Living with heart failure requires you to make changes in your life. Your health care team will  teach you about the changes you need to make in order to relieve your symptoms and lower your risk of going to the hospital. Follow the treatment plan as set by your health care provider. Medicines Medicines are important in reducing your heart's workload, slowing the progression of heart failure, and improving your symptoms.  Take over-the-counter and prescription medicines only as told by your health care provider.  Do not stop taking your medicine unless your health care provider tells you to do that.  Do not skip any dose of your medicine.  Refill prescriptions before you run out of medicine. You need your medicines every day.  Eating and drinking  Eat heart-healthy foods. Talk with a dietitian to make an eating plan that is right for you. ? If directed by your health care provider: ? Limit salt (sodium). Lowering your sodium intake may reduce symptoms of heart failure. Ask a dietitian to recommend heart-healthy seasonings. ? Limit your fluid intake. Fluid restriction may reduce symptoms of heart failure. ? Use low-fat cooking methods instead of frying. Low-fat methods include roasting, grilling, broiling, baking, poaching, steaming, and stir-frying. ? Choose foods that contain no trans fat and are low in saturated fat and cholesterol. Healthy choices include fresh or frozen fruits and vegetables, fish, lean meats, legumes, fat-free or low-fat dairy products, and whole-grain or high-fiber foods.  Limit alcohol intake to no more than 1 drink a day for nonpregnant women and 2 drinks a day for men. One drink equals 12 oz of beer, 5 oz of wine, or 1 oz of hard liquor. ? Drinking more than that is harmful to your heart. Tell your health care provider if you drink alcohol several times a week. ? Talk with your health care provider about whether any level of alcohol use is safe for you. Activity  Ask your health care provider about attending cardiac rehabilitation. These programs include  aerobic physical activity, which provides many benefits for your heart.  If no cardiac rehabilitation program is available, ask your health care provider what aerobic exercises are safe for you to do. Lifestyle Make the lifestyle changes recommended by your health care provider. In general:  Lose weight if your health care provider tells you to do that. Weight loss may reduce symptoms of heart failure.  Do not use any products that contain nicotine or tobacco, such as cigarettes or e-cigarettes. If you need help quitting, ask your health care provider.  Do not use street (illegal) drugs.  Return to your  normal activities as told by your health care provider. Ask your health care provider what activities are safe for you.  General instructions  Make sure you weigh yourself every day to track your weight. Rapid weight gain may indicate an increase in fluid in your body and may increase the workload of your heart. ? Weigh yourself every morning. Do this after you urinate but before you eat breakfast. ? Wear the same type of clothing, without shoes, each time you weigh yourself. ? Weigh yourself on the same scale and in the same spot each time.  Living with chronic heart failure often leads to emotions such as fear, stress, anxiety, and depression. If you feel any of these emotions and need help coping, contact your health care provider. Other ways to get help include: ? Talking to friends and family members about your condition. They can give you support and guidance. Explain your symptoms to them and, if comfortable, invite them to attend appointments or rehabilitation with you. ? Joining a support group for people with chronic heart failure. Talking with other people who have the same symptoms may give you new ways of coping with your disease and your emotions.  Stay up to date with your shots (vaccines). Staying current on pneumococcal and influenza vaccines is especially important in  preventing germs from attacking your airways (respiratory infections).  Keep all follow-up visits as told by your health care provider. This is important. How to recognize changes in your condition You and your family members need to know what changes to watch for in your condition. Watch for the following changes and report them to your health care provider:  Sudden weight gain. Ask your health care provider what amount of weight gain to report.  Shortness of breath: ? Feeling short of breath while at rest, with no exercise or activity that required great effort. ? Feeling breathless with activity.  Swelling of your lower legs or ankles.  Difficulty sleeping: ? You wake up feeling short of breath. ? You have to use more pillows to raise your head in order to sleep.  Frequent, dry, hacking cough.  Loss of appetite.  Feeling more tired all the time.  Depression or feelings of sadness or hopelessness.  Bloating in the stomach.  Where to find more information  Local support groups. Ask your health care provider about groups near you.  The American Heart Association: www.heart.org Contact a health care provider if:  You have a rapid weight gain.  You have increasing shortness of breath that is unusual for you.  You are unable to participate in your usual physical activities.  You tire easily.  You cough more than normal, especially with physical activity.  You have any swelling or more swelling in areas such as your hands, feet, ankles, or abdomen.  You feel like your heart is beating quickly (palpitations).  You become dizzy or light-headed when you stand up. Get help right away if:  You have difficulty breathing.  You notice or your family notices a change in your awareness, such as having trouble staying awake or having difficulty with concentration.  You have pain or discomfort in your chest.  You have an episode of fainting (syncope). Summary  There is  no cure for heart failure, so it is important for you to take good care of yourself and follow the treatment plan set by your health care provider.  Medicines are important in reducing your heart's workload, slowing the progression of heart failure,  and improving your symptoms.  Living with chronic heart failure often leads to emotions such as fear, stress, anxiety, and depression. If you are feeling any of these emotions and need help coping, contact your health care provider. This information is not intended to replace advice given to you by your health care provider. Make sure you discuss any questions you have with your health care provider. Document Released: 02/18/2017 Document Revised: 02/18/2017 Document Reviewed: 02/18/2017 Elsevier Interactive Patient Education  2018 [employer]. Follow-up with primary care physician in 3 days Follow-up with cardiology Dr. Mariah Milling in 1 to 2 weeks Outpatient CHF clinic in 2 to 3 days Monitor intake and output and daily weights

## 2018-07-01 NOTE — Care Management Note (Addendum)
Case Management Note  Patient Details  Name: Charles Rangel MRN: [id number] Date of Birth: 06/19/1976  Subjective/Objective:  Patient discharging today with home health PT and RN.  He has [employer] insurance.  Call [facility] to set up home health.  Faxed signed order to fax number they provided [phone].  Assessment will start  On 9-14 and RN/PT will begin on 9-15.  Told patient to be sure to answer his phone even if number is not recognized.  Verbalizes understanding.  Patient remains on room air.  Patient has an appointment for the heart failure clinic.  Provided Charles Rangel the phone number for [facility] for any home health questions.    Per [facility] an assessment is planned for September 14th and RN/PT services to start on the 15th.     Action/Plan:   Expected Discharge Date:  07/01/18               Expected Discharge Plan:  Home/Self Care  In-House Referral:     Discharge planning Services  CM Consult, HF Clinic  Post Acute Care Choice:  Home Health Choice offered to:  NA([employer] insured; determined by insurance.)  DME Arranged:    DME Agency:     HH Arranged:  RN, PT HH Agency:     Status of Service:  Completed, signed off  If discussed at Long Length of Stay Meetings, dates discussed:    Additional Comments:  Sherren KernsJennifer L Shamarr Faucett, RN 07/01/2018, 3:04 PM

## 2018-07-01 NOTE — Care Management (Signed)
Wife now states they cannot afford a scale.  RNCM provided them with a scale at discharge.

## 2018-07-05 NOTE — Care Management (Signed)
Faxed copy of H&P and Discharge summary to Lauralyn PrimesKaren Mills with [facility] per her request.  FAX [phone]

## 2018-07-08 ENCOUNTER — Telehealth: Payer: Self-pay | Admitting: *Deleted

## 2018-07-15 ENCOUNTER — Ambulatory Visit: Payer: Managed Care, Other (non HMO) | Admitting: Family

## 2018-07-15 ENCOUNTER — Telehealth: Payer: Self-pay | Admitting: Family

## 2018-07-15 NOTE — Telephone Encounter (Signed)
Patient did not show for his Heart Failure Clinic appointment on 07/15/18. Will attempt to reschedule.

## 2018-07-16 ENCOUNTER — Ambulatory Visit: Payer: Managed Care, Other (non HMO) | Admitting: Cardiovascular Disease

## 2018-07-19 ENCOUNTER — Encounter: Payer: Self-pay | Admitting: Cardiovascular Disease

## 2018-08-25 ENCOUNTER — Ambulatory Visit: Payer: Managed Care, Other (non HMO) | Admitting: Internal Medicine

## 2018-08-25 ENCOUNTER — Encounter

## 2018-10-28 IMAGING — CT CT HEAD CODE STROKE
3 series · 14 of 47 positions shown, 16 images · non-contrast
Comparison: 10/04/2012 CT head.

CLINICAL DATA: Code stroke. 42 y/o M; loss of vision to the left
eye 1 hour prior to admission.

EXAM:
CT HEAD WITHOUT CONTRAST
TECHNIQUE: Contiguous axial images were obtained from the base of the skull
through the vertex without intravenous contrast.

[Series 2: head wo · axial · 0.46mm/px · z∈[+508,+638]mm · 8 of 32 slices shown, 10 images]
[im 3/32  brain]
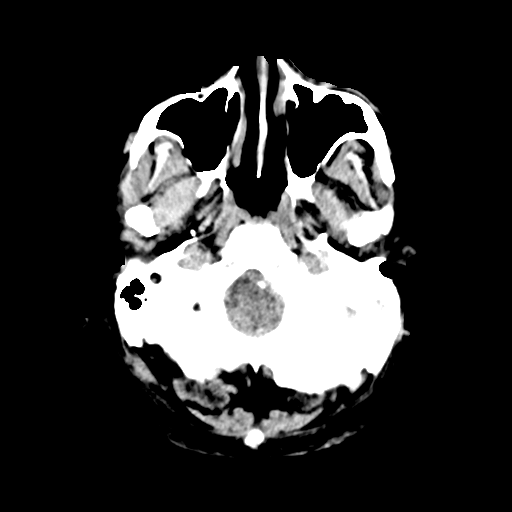
[im 3/32  bone]
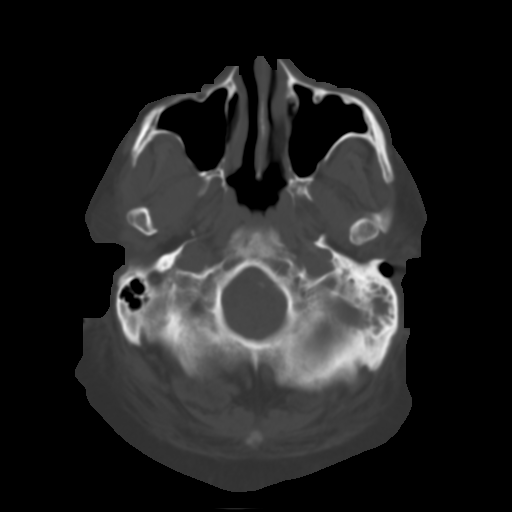
[im 7/32  brain]
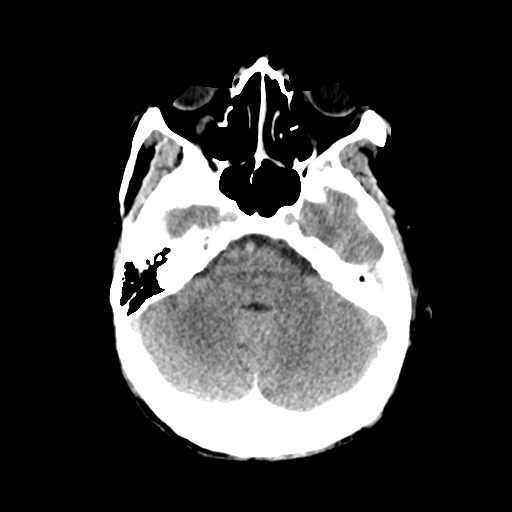
[im 10/32  brain]
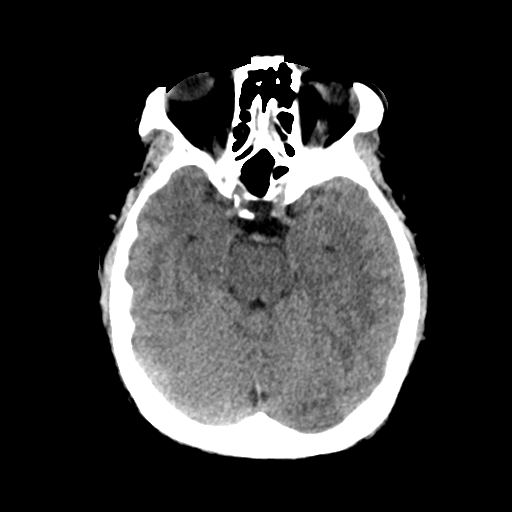
[im 14/32  brain]
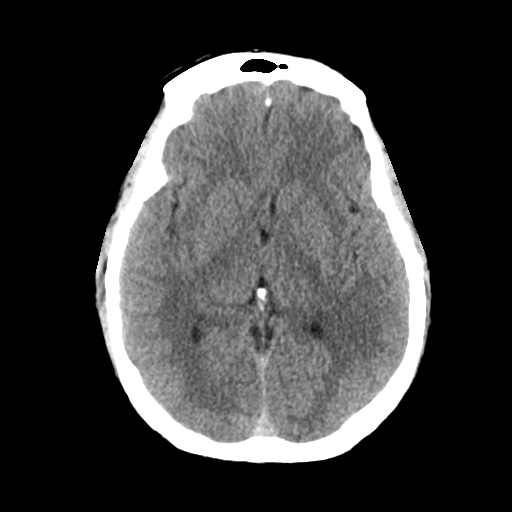
[im 18/32  brain]
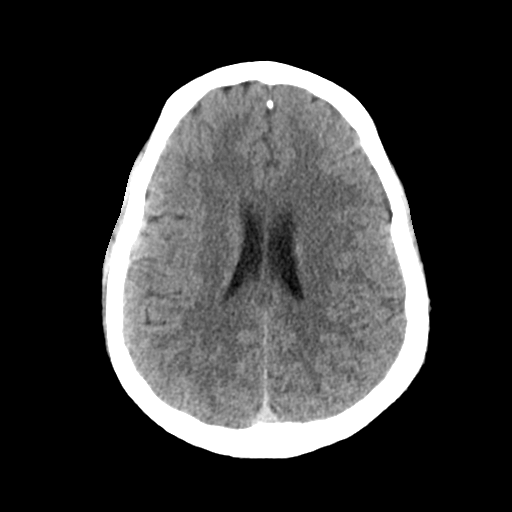
[im 18/32  bone]
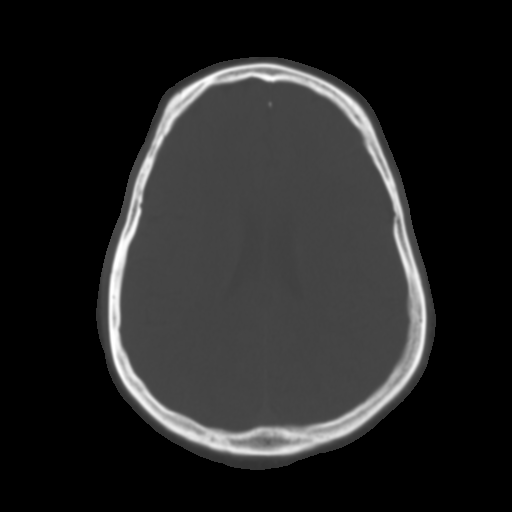
[im 22/32  brain]
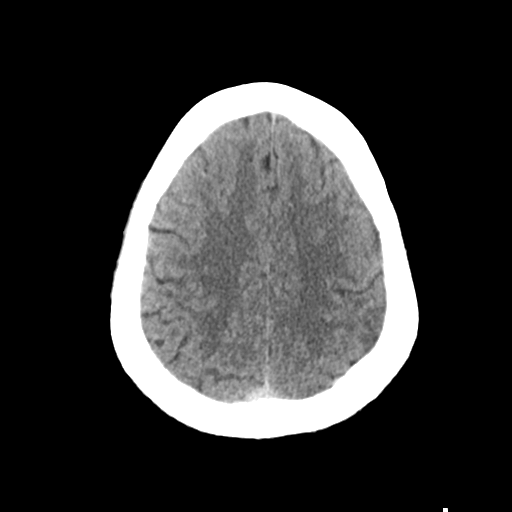
[im 25/32  brain]
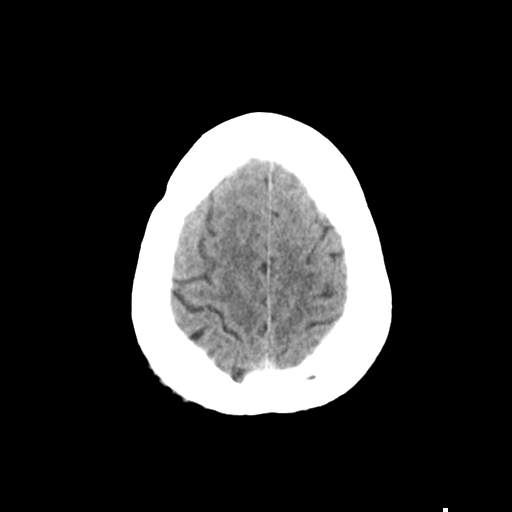
[im 29/32  brain]
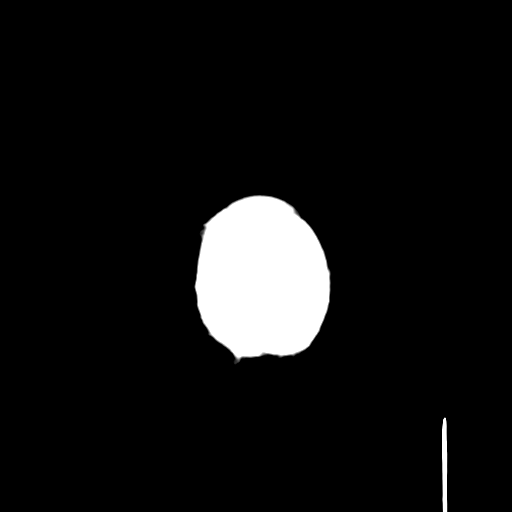

[Series 4: coronal soft tissue · coronal · 0.31mm/px · 3 of 67 slices shown]
[im 23/67  brain]
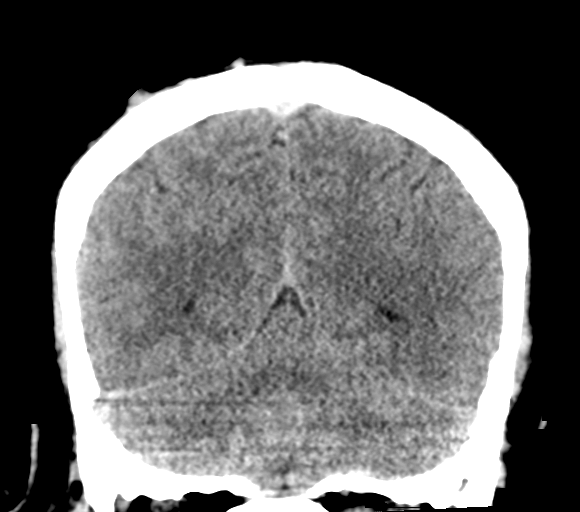
[im 30/67  brain]
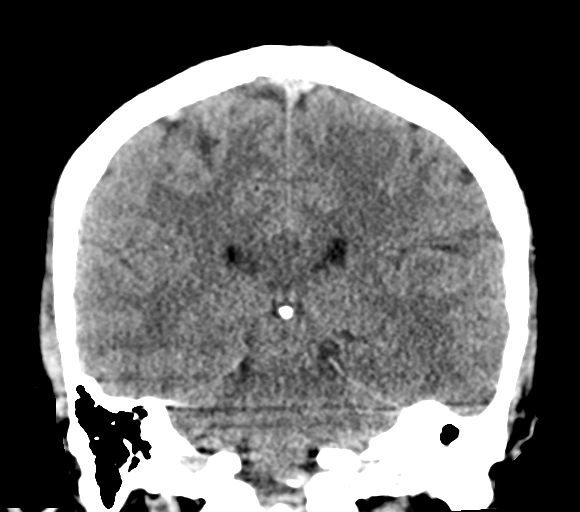
[im 37/67  brain]
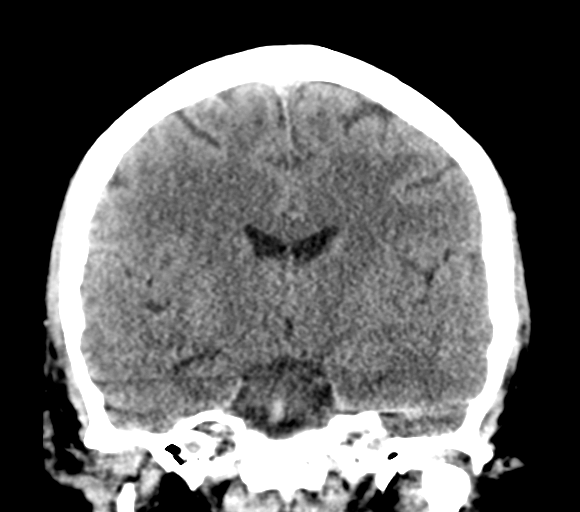

[Series 5: sagittal soft tissue · sagittal · 0.30mm/px · 3 of 54 slices shown]
[im 18/54  brain]
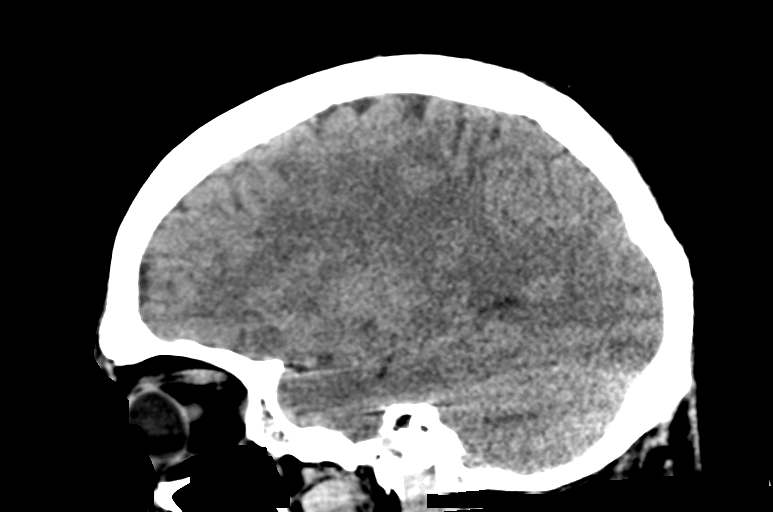
[im 27/54  brain]
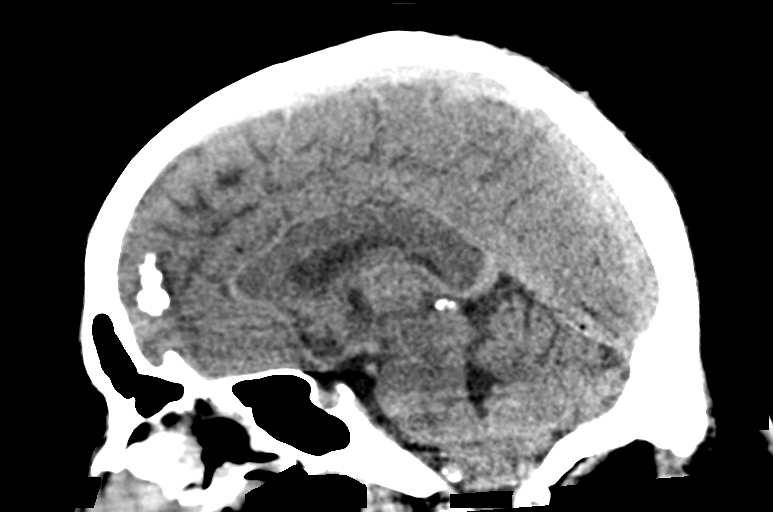
[im 36/54  brain]
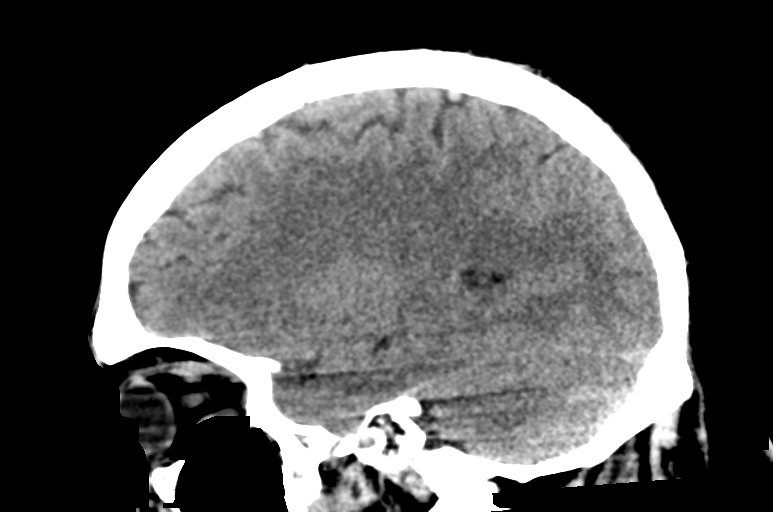

[14 of 47 positions shown; findings below may reference images not displayed]

FINDINGS: Brain: No evidence of acute infarction, hemorrhage, hydrocephalus,
extra-axial collection or mass lesion/mass effect.

Vascular: Calcific atherosclerosis of carotid siphons. No hyperdense
vessel identified.

Skull: Normal. Negative for fracture or focal lesion.

Sinuses/Orbits: Stable left mastoid opacification. Normal aeration
of paranasal sinuses. Orbits are unremarkable.

Other: None.

ASPECTS (Alberta Stroke Program Early CT Score)

- Ganglionic level infarction (caudate, lentiform nuclei, internal
capsule, insula, M1-M3 cortex): 7

- Supraganglionic infarction (M4-M6 cortex): 3

Total score (0-10 with 10 being normal): 10
IMPRESSION: 1. No acute intracranial abnormality identified. Stable unremarkable
CT of the brain.
2. ASPECTS is 10
3. Chronic left mastoid effusion.

These results were called by telephone at the time of interpretation
on 04/30/2018 at [DATE] to Dr. Gerald, who verbally acknowledged
these results.

By: Zhuolun Tacuri M.D.

## 2018-11-14 ENCOUNTER — Other Ambulatory Visit: Payer: Self-pay

## 2018-11-14 ENCOUNTER — Inpatient Hospital Stay
Admission: EM | Admit: 2018-11-14 | Discharge: 2018-11-17 | DRG: 292 | Disposition: A | Discharge status: Home / Self Care | Payer: PRIVATE HEALTH INSURANCE | Attending: Internal Medicine | Admitting: Internal Medicine

## 2018-11-14 ENCOUNTER — Encounter: Payer: Self-pay | Admitting: Emergency Medicine

## 2018-11-14 ENCOUNTER — Emergency Department: Payer: PRIVATE HEALTH INSURANCE

## 2018-11-14 DIAGNOSIS — Z87891 Personal history of nicotine dependence: Secondary | ICD-10-CM

## 2018-11-14 DIAGNOSIS — I872 Venous insufficiency (chronic) (peripheral): Secondary | ICD-10-CM | POA: Diagnosis present

## 2018-11-14 DIAGNOSIS — Y92009 Unspecified place in unspecified non-institutional (private) residence as the place of occurrence of the external cause: Secondary | ICD-10-CM | POA: Diagnosis not present

## 2018-11-14 DIAGNOSIS — T502X6A Underdosing of carbonic-anhydrase inhibitors, benzothiadiazides and other diuretics, initial encounter: Secondary | ICD-10-CM | POA: Diagnosis present

## 2018-11-14 DIAGNOSIS — M109 Gout, unspecified: Secondary | ICD-10-CM | POA: Diagnosis present

## 2018-11-14 DIAGNOSIS — Z7982 Long term (current) use of aspirin: Secondary | ICD-10-CM | POA: Diagnosis not present

## 2018-11-14 DIAGNOSIS — Z91048 Other nonmedicinal substance allergy status: Secondary | ICD-10-CM

## 2018-11-14 DIAGNOSIS — Z7984 Long term (current) use of oral hypoglycemic drugs: Secondary | ICD-10-CM | POA: Diagnosis not present

## 2018-11-14 DIAGNOSIS — Z91018 Allergy to other foods: Secondary | ICD-10-CM

## 2018-11-14 DIAGNOSIS — Z86718 Personal history of other venous thrombosis and embolism: Secondary | ICD-10-CM | POA: Diagnosis not present

## 2018-11-14 DIAGNOSIS — Z87892 Personal history of anaphylaxis: Secondary | ICD-10-CM

## 2018-11-14 DIAGNOSIS — Z888 Allergy status to other drugs, medicaments and biological substances status: Secondary | ICD-10-CM

## 2018-11-14 DIAGNOSIS — G4733 Obstructive sleep apnea (adult) (pediatric): Secondary | ICD-10-CM | POA: Diagnosis present

## 2018-11-14 DIAGNOSIS — J441 Chronic obstructive pulmonary disease with (acute) exacerbation: Secondary | ICD-10-CM | POA: Diagnosis present

## 2018-11-14 DIAGNOSIS — Z9114 Patient's other noncompliance with medication regimen: Secondary | ICD-10-CM

## 2018-11-14 DIAGNOSIS — G44209 Tension-type headache, unspecified, not intractable: Secondary | ICD-10-CM | POA: Diagnosis present

## 2018-11-14 DIAGNOSIS — I5033 Acute on chronic diastolic (congestive) heart failure: Secondary | ICD-10-CM | POA: Diagnosis present

## 2018-11-14 DIAGNOSIS — Z8249 Family history of ischemic heart disease and other diseases of the circulatory system: Secondary | ICD-10-CM

## 2018-11-14 DIAGNOSIS — Z91138 Patient's unintentional underdosing of medication regimen for other reason: Secondary | ICD-10-CM

## 2018-11-14 DIAGNOSIS — E119 Type 2 diabetes mellitus without complications: Secondary | ICD-10-CM | POA: Diagnosis present

## 2018-11-14 DIAGNOSIS — I110 Hypertensive heart disease with heart failure: Secondary | ICD-10-CM | POA: Diagnosis present

## 2018-11-14 DIAGNOSIS — R0602 Shortness of breath: Secondary | ICD-10-CM

## 2018-11-14 DIAGNOSIS — J449 Chronic obstructive pulmonary disease, unspecified: Secondary | ICD-10-CM | POA: Diagnosis not present

## 2018-11-14 DIAGNOSIS — Z79899 Other long term (current) drug therapy: Secondary | ICD-10-CM | POA: Diagnosis not present

## 2018-11-14 DIAGNOSIS — Z6841 Body Mass Index (BMI) 40.0 and over, adult: Secondary | ICD-10-CM

## 2018-11-14 DIAGNOSIS — I509 Heart failure, unspecified: Secondary | ICD-10-CM

## 2018-11-14 HISTORY — DX: Gout, unspecified: M10.9

## 2018-11-14 HISTORY — DX: Morbid (severe) obesity due to excess calories: E66.01

## 2018-11-14 HISTORY — DX: Acute embolism and thrombosis of unspecified deep veins of unspecified lower extremity: I82.409

## 2018-11-14 HISTORY — DX: Unspecified diastolic (congestive) heart failure: I50.30

## 2018-11-14 LAB — COMPREHENSIVE METABOLIC PANEL
ALK PHOS: 90 U/L (ref 38–126)
ALT: 16 U/L (ref 0–44)
ANION GAP: 5 (ref 5–15)
AST: 18 U/L (ref 15–41)
Albumin: 3.9 g/dL (ref 3.5–5.0)
BUN: 16 mg/dL (ref 6–20)
CO2: 33 mmol/L — AB (ref 22–32)
Calcium: 10.2 mg/dL (ref 8.9–10.3)
Chloride: 102 mmol/L (ref 98–111)
Creatinine, Ser: 1.12 mg/dL (ref 0.61–1.24)
Glucose, Bld: 153 mg/dL — ABNORMAL HIGH (ref 70–99)
Potassium: 3.8 mmol/L (ref 3.5–5.1)
SODIUM: 140 mmol/L (ref 135–145)
TOTAL PROTEIN: 7 g/dL (ref 6.5–8.1)
Total Bilirubin: 0.8 mg/dL (ref 0.3–1.2)

## 2018-11-14 LAB — CBC
HCT: 44.7 % (ref 39.0–52.0)
HEMOGLOBIN: 14 g/dL (ref 13.0–17.0)
MCH: 24.7 pg — AB (ref 26.0–34.0)
MCHC: 31.3 g/dL (ref 30.0–36.0)
MCV: 79 fL — ABNORMAL LOW (ref 80.0–100.0)
Platelets: 208 10*3/uL (ref 150–400)
RBC: 5.66 MIL/uL (ref 4.22–5.81)
RDW: 19.2 % — ABNORMAL HIGH (ref 11.5–15.5)
WBC: 6.5 10*3/uL (ref 4.0–10.5)
nRBC: 0 % (ref 0.0–0.2)

## 2018-11-14 LAB — GLUCOSE, CAPILLARY
GLUCOSE-CAPILLARY: 164 mg/dL — AB (ref 70–99)
Glucose-Capillary: 93 mg/dL (ref 70–99)

## 2018-11-14 LAB — TROPONIN I: Troponin I: <0.03 ng/mL (ref <0.03)

## 2018-11-14 LAB — BRAIN NATRIURETIC PEPTIDE: B NATRIURETIC PEPTIDE 5: 43 pg/mL (ref 0.0–100.0)

## 2018-11-14 MED ORDER — FUROSEMIDE 10 MG/ML IJ SOLN
40.0000 mg | Freq: Two times a day (BID) | INTRAMUSCULAR | Status: DC
  Administered 2018-11-15 (×2): 40 mg via INTRAVENOUS
  Filled 2018-11-14 (×2): qty 4

## 2018-11-14 MED ORDER — LISINOPRIL 5 MG PO TABS
5.0000 mg | ORAL_TABLET | Freq: Every day | ORAL | Status: DC
  Administered 2018-11-14 – 2018-11-15 (×2): 5 mg via ORAL
  Filled 2018-11-14 (×2): qty 1

## 2018-11-14 MED ORDER — SODIUM CHLORIDE 0.9 % IV SOLN: 250.0000 mL | INTRAVENOUS | Status: DC | PRN

## 2018-11-14 MED ORDER — TRAMADOL HCL 50 MG PO TABS
50.0000 mg | ORAL_TABLET | Freq: Four times a day (QID) | ORAL | Status: DC | PRN
  Administered 2018-11-15 – 2018-11-17 (×4): 50 mg via ORAL
  Filled 2018-11-14 (×4): qty 1

## 2018-11-14 MED ORDER — ASPIRIN EC 81 MG PO TBEC
81.0000 mg | DELAYED_RELEASE_TABLET | Freq: Every day | ORAL | Status: DC
  Administered 2018-11-15 – 2018-11-17 (×3): 81 mg via ORAL
  Filled 2018-11-14 (×3): qty 1

## 2018-11-14 MED ORDER — FUROSEMIDE 10 MG/ML IJ SOLN
60.0000 mg | Freq: Once | INTRAMUSCULAR | Status: AC
  Administered 2018-11-14: 60 mg via INTRAVENOUS
  Filled 2018-11-14: qty 8

## 2018-11-14 MED ORDER — METFORMIN HCL 500 MG PO TABS
1000.0000 mg | ORAL_TABLET | Freq: Two times a day (BID) | ORAL | Status: DC
  Administered 2018-11-14 – 2018-11-17 (×6): 1000 mg via ORAL
  Filled 2018-11-14 (×6): qty 2

## 2018-11-14 MED ORDER — SODIUM CHLORIDE 0.9% FLUSH
3.0000 mL | Freq: Two times a day (BID) | INTRAVENOUS | Status: DC
  Administered 2018-11-14 – 2018-11-17 (×6): 3 mL via INTRAVENOUS

## 2018-11-14 MED ORDER — ENOXAPARIN SODIUM 40 MG/0.4ML ~~LOC~~ SOLN
40.0000 mg | Freq: Two times a day (BID) | SUBCUTANEOUS | Status: DC
  Administered 2018-11-14 – 2018-11-17 (×6): 40 mg via SUBCUTANEOUS
  Filled 2018-11-14 (×5): qty 0.4

## 2018-11-14 MED ORDER — DOCUSATE SODIUM 100 MG PO CAPS: 100.0000 mg | ORAL_CAPSULE | Freq: Two times a day (BID) | ORAL | Status: DC | PRN

## 2018-11-14 MED ORDER — COLCHICINE 0.6 MG PO TABS
0.6000 mg | ORAL_TABLET | Freq: Every day | ORAL | Status: DC | PRN
  Administered 2018-11-15 – 2018-11-16 (×2): 0.6 mg via ORAL
  Filled 2018-11-14 (×2): qty 1

## 2018-11-14 MED ORDER — VITAMIN D3 25 MCG (1000 UNIT) PO TABS
1000.0000 [IU] | ORAL_TABLET | Freq: Every day | ORAL | Status: DC
  Administered 2018-11-15 – 2018-11-17 (×3): 1000 [IU] via ORAL
  Filled 2018-11-14 (×3): qty 1

## 2018-11-14 MED ORDER — INSULIN ASPART 100 UNIT/ML ~~LOC~~ SOLN
0.0000 [IU] | Freq: Three times a day (TID) | SUBCUTANEOUS | Status: DC
  Administered 2018-11-16: 3 [IU] via SUBCUTANEOUS
  Administered 2018-11-17: 1 [IU] via SUBCUTANEOUS
  Filled 2018-11-14 (×2): qty 1

## 2018-11-14 MED ORDER — SODIUM CHLORIDE 0.9% FLUSH: 3.0000 mL | INTRAVENOUS | Status: DC | PRN

## 2018-11-14 MED ORDER — ONDANSETRON HCL 4 MG/2ML IJ SOLN: 4.0000 mg | Freq: Four times a day (QID) | INTRAMUSCULAR | Status: DC | PRN

## 2018-11-14 MED ORDER — ACETAMINOPHEN 325 MG PO TABS: 650.0000 mg | ORAL_TABLET | ORAL | Status: DC | PRN

## 2018-11-14 NOTE — ED Notes (Signed)
Patient transported to X-ray 

## 2018-11-14 NOTE — ED Triage Notes (Addendum)
Pt to ED with c/o of SOB that started Friday. Pt NAD at this time Sat 95% in triage. Pt denies chest pain.

## 2018-11-14 NOTE — H&P (Signed)
[facility] - [place] at [facility]   PATIENT NAME: Charles Rangel    MR#:  [id number]  DATE OF BIRTH:  Jul 26, 1976  DATE OF ADMISSION:  11/14/2018  PRIMARY CARE PHYSICIAN: [facility]   REQUESTING/REFERRING PHYSICIAN: Dr. Thomasene Mohair  CHIEF COMPLAINT:   Increasing shortness of breath since yesterday. HISTORY OF PRESENT ILLNESS:  Charles Rangel  is a 43 y.o. male with a known history of chronic diastolic heart failure, COPD, diabetes, obstructive sleep apnea comes to the emergency room after he started was increasing shortness of breath. Patient is drained out of his Bumex four days ago. He got it delivered and was unable to access since it was delivered at the apartment office which was closed for the weekend.  Want to be in congestive heart failure via x-ray. BNP is normal. Patient was short winded with sats in the upper 90s. He received IV Lasix being admitted for further evaluation of management  PAST MEDICAL HISTORY:   Past Medical History:  Diagnosis Date  . CHF (congestive heart failure) (HCC)   . COPD (chronic obstructive pulmonary disease) (HCC)   . Diabetes mellitus without complication (HCC)   . DVT (deep vein thrombosis) in pregnancy   . HTN (hypertension)   . Hypertension   . OSA (obstructive sleep apnea)     PAST SURGICAL HISTOIRY:   Past Surgical History:  Procedure Laterality Date  . Left leg surgery for fracture      SOCIAL HISTORY:   Social History   Tobacco Use  . Smoking status: Former Games developer  . Smokeless tobacco: Never Used  Substance Use Topics  . Alcohol use: Yes    Frequency: Never    Comment: rarely    FAMILY HISTORY:   Family History  Problem Relation Age of Onset  . Hypertension Mother     DRUG ALLERGIES:   Allergies  Allergen Reactions  . Tylenol [Acetaminophen] Anaphylaxis  . Dog Epithelium Allergy Skin Test Hives  . Apple Hives  . Other Hives    RAISINS    REVIEW OF SYSTEMS:  Review  of Systems  Constitutional: Negative for chills, fever and weight loss.  HENT: Negative for ear discharge, ear pain and nosebleeds.   Eyes: Negative for blurred vision, pain and discharge.  Respiratory: Negative for sputum production, shortness of breath, wheezing and stridor.   Cardiovascular: Positive for leg swelling. Negative for chest pain, palpitations, orthopnea and PND.  Gastrointestinal: Negative for abdominal pain, diarrhea, nausea and vomiting.  Genitourinary: Negative for frequency and urgency.  Musculoskeletal: Negative for back pain and joint pain.  Neurological: Negative for sensory change, speech change, focal weakness and weakness.  Psychiatric/Behavioral: Negative for depression and hallucinations. The patient is not nervous/anxious.      MEDICATIONS AT HOME:   Prior to Admission medications   Medication Sig Start Date End Date Taking? Authorizing Provider  aspirin EC 81 MG tablet Take 81 mg daily by mouth.   Yes [provider]  bumetanide (BUMEX) 2 MG tablet Take 2 mg by mouth daily.   Yes [provider]  Cholecalciferol (VITAMIN D3) 1000 units CAPS Take 1 capsule by mouth daily.    Yes [provider]  lisinopril (PRINIVIL,ZESTRIL) 5 MG tablet Take 1 tablet (5 mg total) by mouth daily. 07/02/18  Yes Gouru, Deanna Artis, MD  metFORMIN (GLUCOPHAGE) 500 MG tablet Take 1,000 mg by mouth 2 (two) times daily with a meal.    Yes [provider]  metoprolol succinate (TOPROL-XL) 100 MG  24 hr tablet Take 100 mg by mouth daily. Take with or immediately following a meal.   Yes [provider]  carvedilol (COREG) 25 MG tablet Take 1 tablet by mouth 2 (two) times daily with a meal.     [provider]  colchicine 0.6 MG tablet Take 1 tablet (0.6 mg total) by mouth daily. 07/01/18   Ramonita LabGouru, Aruna, MD  docusate sodium (COLACE) 100 MG capsule Take 1 capsule (100 mg total) by mouth 2 (two) times daily as needed for mild constipation. 07/01/18    Ramonita LabGouru, Aruna, MD  furosemide (LASIX) 40 MG tablet Take 1 tablet (40 mg total) by mouth 2 (two) times daily. Patient not taking: Reported on 11/14/2018 07/01/18   Ramonita LabGouru, Aruna, MD  potassium chloride 20 MEQ TBCR Take 20 mEq by mouth daily. Patient not taking: Reported on 11/14/2018 07/01/18   Ramonita LabGouru, Aruna, MD  traMADol (ULTRAM) 50 MG tablet Take 1 tablet (50 mg total) by mouth every 6 (six) hours as needed. 07/01/18 07/01/19  Ramonita LabGouru, Aruna, MD      VITAL SIGNS:  Blood pressure (!) 151/96, pulse 98, temperature 97.9 F (36.6 C), temperature source Oral, resp. rate 17, height 5\' 6"  (1.676 m), weight (!) 146.1 kg, SpO2 96 %.  PHYSICAL EXAMINATION:  GENERAL:  43 y.o.-year-old patient lying in the bed with no acute distress. Beasley obese EYES: Pupils equal, round, reactive to light and accommodation. No scleral icterus. Extraocular muscles intact.  HEENT: Head atraumatic, normocephalic. Oropharynx and nasopharynx clear.  NECK:  Supple, no jugular venous distention. No thyroid enlargement, no tenderness.  LUNGS: decreased breath sounds bilaterally, no wheezing, rales,rhonchi or crepitation. No use of accessory muscles of respiration.  CARDIOVASCULAR: S1, S2 normal. No murmurs, rubs, or gallops.  ABDOMEN: Soft, nontender, nondistended. Bowel sounds present. No organomegaly or mass.  EXTREMITIES: chronic pedal edema, cyanosis, or clubbing.  NEUROLOGIC: Cranial nerves II through XII are intact. Muscle strength 5/5 in all extremities. Sensation intact. Gait not checked.  PSYCHIATRIC: The patient is alert and oriented x 3.  SKIN: No obvious rash, lesion, or ulcer.   LABORATORY PANEL:   CBC Recent Labs  Lab 11/14/18 1543  WBC 6.5  HGB 14.0  HCT 44.7  PLT 208   ------------------------------------------------------------------------------------------------------------------  Chemistries  Recent Labs  Lab 11/14/18 1543  NA 140  K 3.8  CL 102  CO2 33*  GLUCOSE 153*  BUN 16  CREATININE 1.12   CALCIUM 10.2  AST 18  ALT 16  ALKPHOS 90  BILITOT 0.8   ------------------------------------------------------------------------------------------------------------------  Cardiac Enzymes Recent Labs  Lab 11/14/18 1543  TROPONINI <0.03   ------------------------------------------------------------------------------------------------------------------  RADIOLOGY:  Dg Chest 2 View  Result Date: 11/14/2018 CLINICAL DATA:  Shortness of breath and left-sided chest pain. EXAM: CHEST - 2 VIEW COMPARISON:  06/28/2018 FINDINGS: Mildly enlarged cardiac silhouette. Mediastinal contours appear intact. There is no evidence of pleural effusion or pneumothorax. Patchy interstitial and alveolar opacities are present diffusely throughout both lungs. The pattern of distribution is similar to the last chest radiograph dated 06/28/2018. Osseous structures are without acute abnormality. Soft tissues are grossly normal. IMPRESSION: Probable mixed pulmonary edema, not significantly changed from the last radiograph, dated September 2019. Mildly enlarged cardiac silhouette. Electronically Signed   By: Ted Mcalpineobrinka  Dimitrova M.D.   On: 11/14/2018 15:50    EKG:    IMPRESSION AND PLAN:   Charles Rangel  is a 43 y.o. male with a known history of chronic diastolic heart failure, COPD, diabetes, obstructive sleep apnea comes to  the emergency room after he started was increasing shortness of breath. Patient is drained out of his Bumex four days ago.  1. Acute on chronic diastolic congestive heart failure secondary to running out of diuretics -admit to telemetry -IV Lasix -resume bumex at discharge -continue cardiac meds  2. Diabetes -sliding scale and metformin  3. Obstructive sleep apnea patient uses CPAP at home  4. DVT prophylaxis subcu Lovenox All the records are reviewed and case discussed with ED provider.   CODE STATUS: full  TOTAL TIME TAKING CARE OF THIS PATIENT: 50 minutes.    Enedina Finner  M.D on 11/14/2018 at 5:33 PM  Between 7am to 6pm - Pager - [phone]  After 6pm go to [email or web address] - password [email or web address] [facility]  [facility]  Office  [phone]  CC: Primary care physician; [facility]

## 2018-11-14 NOTE — ED Provider Notes (Signed)
[facility] Emergency Department Provider Note  Time seen: 3:47 PM  I have reviewed the triage vital signs and the nursing notes.   HISTORY  Chief Complaint Shortness of Breath    HPI Charles Rangel is a 43 y.o. male with a past medical history of CHF, COPD, diabetes, hypertension, presents to the emergency department for shortness of breath.  According to the patient for the past 3 to 4 days he has been feeling short of breath.  Patient last took his Bumex on Wednesday has been out ever since.  Patient denies any chest pain.  States his legs have been more swollen over the past several days as well but denies any leg pain.  Shortness of breath is worse with exertion.  States occasional cough with white sputum production.  Denies any fever.   Past Medical History:  Diagnosis Date  . CHF (congestive heart failure) (HCC)   . COPD (chronic obstructive pulmonary disease) (HCC)   . Diabetes mellitus without complication (HCC)   . DVT (deep vein thrombosis) in pregnancy   . HTN (hypertension)   . Hypertension   . OSA (obstructive sleep apnea)     Patient Active Problem List   Diagnosis Date Noted  . Acute on chronic diastolic CHF (congestive heart failure) (HCC) 06/28/2018  . Acute respiratory failure with hypoxia (HCC) 03/04/2018    Past Surgical History:  Procedure Laterality Date  . Left leg surgery for fracture      Prior to Admission medications   Medication Sig Start Date End Date Taking? Authorizing Provider  aspirin EC 81 MG tablet Take 81 mg daily by mouth.    [provider]  carvedilol (COREG) 25 MG tablet Take 1 tablet by mouth 2 (two) times daily with a meal.     [provider]  Cholecalciferol (VITAMIN D3) 1000 units CAPS Take 1 capsule by mouth daily.     [provider]  colchicine 0.6 MG tablet Take 1 tablet (0.6 mg total) by mouth daily. 07/01/18   Ramonita Lab, MD  cyclobenzaprine (FLEXERIL) 10 MG tablet  Take 1 tablet by mouth 3 (three) times daily as needed.    [provider]  docusate sodium (COLACE) 100 MG capsule Take 1 capsule (100 mg total) by mouth 2 (two) times daily as needed for mild constipation. 07/01/18   Ramonita Lab, MD  furosemide (LASIX) 40 MG tablet Take 1 tablet (40 mg total) by mouth 2 (two) times daily. 07/01/18   Gouru, Deanna Artis, MD  lisinopril (PRINIVIL,ZESTRIL) 5 MG tablet Take 1 tablet (5 mg total) by mouth daily. 07/02/18   Ramonita Lab, MD  metFORMIN (GLUCOPHAGE) 1000 MG tablet Take 1,000 mg by mouth 2 (two) times daily with a meal.    [provider]  potassium chloride 20 MEQ TBCR Take 20 mEq by mouth daily. 07/01/18   Gouru, Deanna Artis, MD  traMADol (ULTRAM) 50 MG tablet Take 1 tablet (50 mg total) by mouth every 6 (six) hours as needed. 07/01/18 07/01/19  Ramonita Lab, MD    Allergies  Allergen Reactions  . Tylenol [Acetaminophen] Anaphylaxis    Family History  Problem Relation Age of Onset  . Hypertension Mother     Social History Social History   Tobacco Use  . Smoking status: Former Games developer  . Smokeless tobacco: Never Used  Substance Use Topics  . Alcohol use: Yes    Frequency: Never    Comment: rarely  . Drug use: No    Review of  Systems Constitutional: Negative for fever. Cardiovascular: Negative for chest pain. Respiratory: Positive for shortness of breath, worse with exertion. Gastrointestinal: Negative for abdominal pain, vomiting  Genitourinary: Negative for urinary compaints Musculoskeletal: Increased lower extremity edema. Skin: Negative for skin complaints  Neurological: Negative for headache All other ROS negative  ____________________________________________   PHYSICAL EXAM:  VITAL SIGNS: ED Triage Vitals  Enc Vitals Group     BP 11/14/18 1509 (!) 162/88     Pulse Rate 11/14/18 1509 99     Resp 11/14/18 1509 18     Temp 11/14/18 1509 97.9 F (36.6 C)     Temp Source 11/14/18 1509 Oral     SpO2 11/14/18 1509 95 %      Weight 11/14/18 1510 (!) 322 lb (146.1 kg)     Height 11/14/18 1510 5\' 6"  (1.676 m)     Head Circumference --      Peak Flow --      Pain Score 11/14/18 1510 0     Pain Loc --      Pain Edu? --      Excl. in GC? --    Constitutional: Alert and oriented. Well appearing and in no distress. Eyes: Normal exam ENT   Head: Normocephalic and atraumatic.   Mouth/Throat: Mucous membranes are moist. Cardiovascular: Normal rate, regular rhythm.  Respiratory: Normal respiratory effort without tachypnea nor retractions. Breath sounds are clear Gastrointestinal: Soft and nontender. No distention.  obese. Musculoskeletal: Nontender with normal range of motion in all extremities.  2+ lower extremity and equal bilaterally.  Darkening of the skin consistent with chronic venous stasis. Neurologic:  Normal speech and language. No gross focal neurologic deficits  Skin:  Skin is warm, dry and intact.  Psychiatric: Mood and affect are normal.  ____________________________________________    EKG  EKG viewed and interpreted by myself shows normal sinus rhythm at 96 bpm with a narrow QRS, normal axis, normal intervals, no concerning ST changes.  ____________________________________________    RADIOLOGY  Chest x-ray shows pulmonary edema.  ____________________________________________   INITIAL IMPRESSION / ASSESSMENT AND PLAN / ED COURSE  Pertinent labs & imaging results that were available during my care of the patient were reviewed by me and considered in my medical decision making (see chart for details).  Patient presents to the emergency department for shortness of breath ongoing for the past 4 days, getting worse per patient.  Currently satting 92 to 94% on room air.  Patient denies any chest pain.  Differential this time would include COPD exacerbation, CHF exacerbation, ACS, URI.  We will check labs including BNP.  We will obtain a chest x-ray and continue to closely  monitor.  Chest x-ray on my evaluation appears to show fairly significant pulmonary edema, waiting radiology read.  Labs pending.  Patient's labs are largely reassuring negative troponin and BNP of 43.  However the patient has significant pulmonary edema on chest x-ray, largely unchanged from September the chest x-ray however patient was admitted for several days after that chest x-ray for diuresis.  We will start the patient on Lasix admit to the hospital service.  Maintaining a room air saturation at rest around 93 to 94%.  ____________________________________________   FINAL CLINICAL IMPRESSION(S) / ED DIAGNOSES  Dyspnea CHF exacerbation   Minna AntisPaduchowski, Ashara Lounsbury, MD 11/14/18 1640

## 2018-11-14 NOTE — Progress Notes (Signed)
Anticoagulation monitoring(Lovenox):  43 yo male ordered Lovenox 40 mg Q24h  Filed Weights   11/14/18 1510  Weight: (!) 322 lb (146.1 kg)   BMI 52   Lab Results  Component Value Date   CREATININE 1.12 11/14/2018   CREATININE 1.14 07/01/2018   CREATININE 1.17 06/30/2018   Estimated Creatinine Clearance: 116.3 mL/min (by C-G formula based on SCr of 1.12 mg/dL). Hemoglobin & Hematocrit     Component Value Date/Time   HGB 14.0 11/14/2018 1543   HGB 14.8 07/28/2014 2317   HCT 44.7 11/14/2018 1543   HCT 46.2 07/28/2014 2317     Per Protocol for Patient with estCrcl > 30 ml/min and BMI > 40, will transition to Lovenox 40 mg Q12h.

## 2018-11-15 ENCOUNTER — Encounter: Payer: Self-pay | Admitting: Nurse Practitioner

## 2018-11-15 DIAGNOSIS — I110 Hypertensive heart disease with heart failure: Principal | ICD-10-CM

## 2018-11-15 DIAGNOSIS — G4733 Obstructive sleep apnea (adult) (pediatric): Secondary | ICD-10-CM

## 2018-11-15 DIAGNOSIS — I5033 Acute on chronic diastolic (congestive) heart failure: Secondary | ICD-10-CM

## 2018-11-15 LAB — BASIC METABOLIC PANEL
Anion gap: 8 (ref 5–15)
BUN: 16 mg/dL (ref 6–20)
CO2: 32 mmol/L (ref 22–32)
Calcium: 10.1 mg/dL (ref 8.9–10.3)
Chloride: 102 mmol/L (ref 98–111)
Creatinine, Ser: 1.01 mg/dL (ref 0.61–1.24)
GFR calc Af Amer: >60 mL/min (ref >60)
GFR calc non Af Amer: >60 mL/min (ref >60)
Glucose, Bld: 111 mg/dL — ABNORMAL HIGH (ref 70–99)
Potassium: 4 mmol/L (ref 3.5–5.1)
Sodium: 142 mmol/L (ref 135–145)

## 2018-11-15 LAB — GLUCOSE, CAPILLARY
GLUCOSE-CAPILLARY: 106 mg/dL — AB (ref 70–99)
Glucose-Capillary: 96 mg/dL (ref 70–99)
Glucose-Capillary: 107 mg/dL — ABNORMAL HIGH (ref 70–99)
Glucose-Capillary: 112 mg/dL — ABNORMAL HIGH (ref 70–99)

## 2018-11-15 MED ORDER — FUROSEMIDE 10 MG/ML IJ SOLN
60.0000 mg | Freq: Two times a day (BID) | INTRAMUSCULAR | Status: DC
  Administered 2018-11-16 (×2): 60 mg via INTRAVENOUS
  Filled 2018-11-15: qty 6

## 2018-11-15 MED ORDER — CARVEDILOL 12.5 MG PO TABS
12.5000 mg | ORAL_TABLET | Freq: Two times a day (BID) | ORAL | Status: DC
  Administered 2018-11-15 – 2018-11-16 (×2): 12.5 mg via ORAL
  Filled 2018-11-15 (×2): qty 1

## 2018-11-15 MED ORDER — ALLOPURINOL 100 MG PO TABS
100.0000 mg | ORAL_TABLET | Freq: Every day | ORAL | Status: DC
  Administered 2018-11-15 – 2018-11-17 (×3): 100 mg via ORAL
  Filled 2018-11-15 (×3): qty 1

## 2018-11-15 MED ORDER — LISINOPRIL 10 MG PO TABS
10.0000 mg | ORAL_TABLET | Freq: Every day | ORAL | Status: DC
  Administered 2018-11-16: 10 mg via ORAL
  Filled 2018-11-15: qty 1

## 2018-11-15 MED ORDER — FUROSEMIDE 10 MG/ML IJ SOLN
20.0000 mg | Freq: Once | INTRAMUSCULAR | Status: AC
  Administered 2018-11-15: 20 mg via INTRAVENOUS
  Filled 2018-11-15: qty 2

## 2018-11-15 NOTE — Plan of Care (Signed)
Pt refused to watch Emi videos, per pt watched them last time he was here.  Went over the heart failure booklet, explained the importance of weighing himself every morning, low sodium and heart healthy diet, exercise. Pt ambulated around nurses station, sob on exertion noted.   Problem: Education: Goal: Knowledge of General Education information will improve Description Including pain rating scale, medication(s)/side effects and non-pharmacologic comfort measures Outcome: Progressing   Problem: Education: Goal: Ability to demonstrate management of disease process will improve Outcome: Progressing   Problem: Cardiac: Goal: Ability to achieve and maintain adequate cardiopulmonary perfusion will improve Outcome: Progressing

## 2018-11-15 NOTE — Progress Notes (Signed)
Ch visited w/ pt briefly before a family friend arrived. Pt shared that he has had trouble breathing for a while but has also lost a significant amount of weight. Pt shared that he was grateful of the progress he has made. Pt seems to hv a support system of family/friends. Ch ended visit to allow a friend to visit w/ pt alone.     11/15/18 1100  Clinical Encounter Type  Visited With Patient  Visit Type Initial;Spiritual support;Social support  Spiritual Encounters  Spiritual Needs Emotional  Stress Factors  Patient Stress Factors Loss;Loss of control  Family Stress Factors None identified

## 2018-11-15 NOTE — Plan of Care (Signed)
Nutrition Education Note  RD consulted for nutrition education regarding CHF.  43 y.o. male with a known history of chronic diastolic heart failure, COPD, diabetes, obstructive sleep apnea admitted with CHF  RD provided "Low Sodium Nutrition Therapy" handout from the Academy of [employer]. Reviewed patient's dietary recall. Provided examples on ways to decrease sodium intake in diet. Discouraged intake of processed foods and use of salt shaker. Encouraged fresh fruits and vegetables as well as whole grain sources of carbohydrates to maximize fiber intake.   RD discussed why it is important for patient to adhere to diet recommendations, and emphasized the role of fluids, foods to avoid, and importance of weighing self daily. Teach back method used.  Expect poor compliance.  Body mass index is 51.79 kg/m. Pt meets criteria for morbid obesity based on current BMI.  Current diet order is HH, patient is consuming approximately 100% of meals at this time. Labs and medications reviewed. No further nutrition interventions warranted at this time. RD contact information provided. If additional nutrition issues arise, please re-consult RD.   Betsey Holiday MS, RD, LDN Pager #- [phone] Office#- [phone] After Hours Pager: [phone]

## 2018-11-15 NOTE — Consult Note (Signed)
Cardiology Consult    Patient ID: Charles Rangel MRN: [id number], DOB/AGE: August 31, 1976   Admit date: 11/14/2018 Date of Consult: 11/15/2018  Primary Physician: [facility] Primary Cardiologist: Lorine BearsMuhammad Arida, MD Requesting Provider: Seth BakeV. Sherryll BurgerShah, MD  Patient Profile    Charles HowellsKendrick Rangel is a 43 y.o. male with a history of HFpEF, remote tob abuse, COPD, HTN, obesity, OSA, DMII, remote DVT, and gout, who is being seen today for the evaluation of CHF at the request of Dr. Sherryll BurgerShah.  Past Medical History   Past Medical History:  Diagnosis Date  . (HFpEF) heart failure with preserved ejection fraction (HCC)    a. 02/2018 Echo: EF 65-70%, mildly dil LA.  Marland Kitchen. COPD (chronic obstructive pulmonary disease) (HCC)   . Diabetes mellitus without complication (HCC)   . DVT (deep venous thrombosis) (HCC)   . Gout   . Hypertension   . Morbid obesity (HCC)   . OSA (obstructive sleep apnea)     Past Surgical History:  Procedure Laterality Date  . Left leg surgery for fracture       Allergies  Allergies  Allergen Reactions  . Tylenol [Acetaminophen] Anaphylaxis  . Dog Epithelium Allergy Skin Test Hives  . Apple Hives  . Other Hives    RAISINS    History of Present Illness    43 year old male with a history of HFpEF, morbid obesity, obstructive sleep apnea, hypertension, diabetes, remote right lower extremity DVT in the setting of prolonged travel, remote tobacco abuse, and COPD.  In May 2019, he was admitted to [facility] with acute on chronic respiratory failure in the setting of COPD flare mild CHF.  Echo showed normal LV function.  [facility] as an [facility].  He was read[facility] in September [facility] in the setting of recurrent heart failure and gout.  He is on Bumex at home.  He lives locally and says that he exercises 4 to 5 days/week, walking on the treadmill at 2 to 2.5 mph for 1 hour.  He does not typically experience dyspnea on  exertion.  He has chronic 3 pillow orthopnea despite already sleeping in a recliner.  Though he has CPAP, his machine has been broken for at least a month and he needs to take it in for repair.  His dry weight at home is between 310 and 312 pounds.  He does admit to dietary indiscretion, frequently eating out or bringing fast food home.  He checks his blood pressure regularly at home and it typically runs in the 140s to 150s.  On Thursday, January 23, he ran out of his bumetanide.  [place] [facility] and unfortunately, they were delivered to the main office of his apartment complex.  That office was closed over the weekend.  In that setting, he noticed an almost immediate 4 pound weight gain by the morning of the 24th with persistent weight gain and increasing lower extremity edema over the weekend.  On January 26, he was at church and was very dyspneic while helping with the collection.  It was following church, that he decided to come into the ED for evaluation.  In the emergency department, chest x-ray did show interstitial edema though BNP was normal at 43.  Troponin was normal.  Lab work was otherwise unremarkable.  He was admitted and placed on intravenous Lasix and thus far, he is -2.39 L and weight is down roughly 2 pounds.  He feels as though he has good response from Lasix.  Renal function has remained normal.  He denies any chest pain.  Inpatient Medications    . aspirin EC  81 mg Oral Daily  . carvedilol  12.5 mg Oral BID WC  . cholecalciferol  1,000 Units Oral Daily  . enoxaparin (LOVENOX) injection  40 mg Subcutaneous Q12H  . furosemide  40 mg Intravenous BID  . insulin aspart  0-9 Units Subcutaneous TID WC  . lisinopril  5 mg Oral Daily  . metFORMIN  1,000 mg Oral BID WC  . sodium chloride flush  3 mL Intravenous Q12H    Family History    Family History  Problem Relation Age of Onset  . Hypertension Mother   . Hypertension Father   . CVA Father  1645   He indicated that his mother is alive. He indicated that his father is alive.   Social History    Social History   Socioeconomic History  . Marital status: Married    Spouse name: Not on file  . Number of children: Not on file  . Years of education: Not on file  . Highest education level: Not on file  Occupational History  . Not on file  Social Needs  . Financial resource strain: Not on file  . Food insecurity:    Worry: Not on file    Inability: Not on file  . Transportation needs:    Medical: Not on file    Non-medical: Not on file  Tobacco Use  . Smoking status: Former Smoker    Packs/day: 1.00    Years: 20.00    Pack years: 20.00    Last attempt to quit: 10/20/2010    Years since quitting: 8.0  . Smokeless tobacco: Never Used  Substance and Sexual Activity  . Alcohol use: Yes    Frequency: Never    Comment: rarely, not in 1.5 years  . Drug use: No  . Sexual activity: Not Currently  Lifestyle  . Physical activity:    Days per week: Not on file    Minutes per session: Not on file  . Stress: Not on file  Relationships  . Social connections:    Talks on phone: Not on file    Gets together: Not on file    Attends religious service: Not on file    Active member of club or organization: Not on file    Attends meetings of clubs or organizations: Not on file    Relationship status: Not on file  . Intimate partner violence:    Fear of current or ex partner: Not on file    Emotionally abused: Not on file    Physically abused: Not on file    Forced sexual activity: Not on file  Other Topics Concern  . Not on file  Social History Narrative   Lives in [place] with his wife.  Exercises @ the gym 4-5x/wk - treadmill @ 2-2.5 mph x 1 hr.     Review of Systems    General:  No chills, fever, night sweats or weight changes.  Cardiovascular:  No chest pain, +++ dyspnea on exertion, +++ LE edema, +++ chronic 3 pillow orthopnea - sleeps in recliner, no palpitations,  paroxysmal nocturnal dyspnea. Dermatological: No rash, lesions/masses Respiratory: No cough, +++ dyspnea Urologic: No hematuria, dysuria Abdominal:   No nausea, vomiting, diarrhea, bright red blood per rectum, melena, or hematemesis Neurologic:  No visual changes, wkns, changes in mental status. All  other systems reviewed and are otherwise negative except as noted above.  Physical Exam    Blood pressure (!) 153/92, pulse 91, temperature 98.5 F (36.9 C), temperature source Oral, resp. rate 18, height 5\' 6"  (1.676 m), weight (!) 145.6 kg, SpO2 94 %.  General: Pleasant, NAD Psych: Normal affect. Neuro: Alert and oriented X 3. Moves all extremities spontaneously. HEENT: Normal  Neck: Supple without bruits. Obese - difficult to gauge JVP. Lungs:  Resp regular and unlabored, CTA. Heart: RRR, 1/6 early systolic murmur, no s3, s4, or murmurs. Abdomen: Obese, soft, non-tender, non-distended, BS + x 4.  Extremities: No clubbing, cyanosis.  2+ bilat woody edema to the knees. DP/PT/Radials 2+ and equal bilaterally.  Labs     Recent Labs    11/14/18 1543  TROPONINI <0.03   Lab Results  Component Value Date   WBC 6.5 11/14/2018   HGB 14.0 11/14/2018   HCT 44.7 11/14/2018   MCV 79.0 (L) 11/14/2018   PLT 208 11/14/2018    Recent Labs  Lab 11/14/18 1543 11/15/18 0511  NA 140 142  K 3.8 4.0  CL 102 102  CO2 33* 32  BUN 16 16  CREATININE 1.12 1.01  CALCIUM 10.2 10.1  PROT 7.0  --   BILITOT 0.8  --   ALKPHOS 90  --   ALT 16  --   AST 18  --   GLUCOSE 153* 111*   Lab Results  Component Value Date   CHOL 149 05/26/2013   HDL 51 05/26/2013   LDLCALC 87 05/26/2013   TRIG 53 05/26/2013   No results found for: [facility]   Radiology Studies    Dg Chest 2 View  Result Date: 11/14/2018 CLINICAL DATA:  Shortness of breath and left-sided chest pain. EXAM: CHEST - 2 VIEW COMPARISON:  06/28/2018 FINDINGS: Mildly enlarged cardiac silhouette. Mediastinal contours appear intact. There  is no evidence of pleural effusion or pneumothorax. Patchy interstitial and alveolar opacities are present diffusely throughout both lungs. The pattern of distribution is similar to the last chest radiograph dated 06/28/2018. Osseous structures are without acute abnormality. Soft tissues are grossly normal. IMPRESSION: Probable mixed pulmonary edema, not significantly changed from the last radiograph, dated September 2019. Mildly enlarged cardiac silhouette. Electronically Signed   By: Ted Mcalpine M.D.   On: 11/14/2018 15:50    ECG & Cardiac Imaging    RSR, 96, small inf q's, twi III  - personally reviewed.  Assessment & Plan    1.  Acute on chronic diastolic CHF:  Prev h/o HFpEF w/ nl EF by echo in 02/2018.  Last admitted w/ volume overload in Sept.  Followed @ [facility].  Says he is compliant w/ meds @ home but admits to dietary indiscretion - eats out fairly regularly.  Ran out of bumex on 1/23 and noted 12 lbs wt gain over the weekend assoc w/ worsening lower ext edema, and DOE. CXR in ED on 1/26 w/ pulm edema.  Wt down minimally, though responding well to diuresis with nl renal fxn. Will escalate diuresis to 60mg  BID.  Cont to follow renal fxn.  Work on BP as he trends 140's to 150's @ home.  Metoprolol switched to carvedilol since admission.  Will increase lisinopril to 10 daily.  Needs CHF education.  2.  Essential HTN:  As above, trends in 140's+ @ home.  Will increase lisinopril to 10.  Cont  blocker. Follow w/ diuresis.  3.  DMII:  Metformin and SSI per IM.  4.  OSA:  CPAP machine broke about 1-2 mos ago.  He is aware the he needs to have it repaired or get replacement.  Signed, Nicolasa Ducking, NP 11/15/2018, 5:19 PM  For questions or updates, please contact   Please consult [email or web address] for contact info under Cardiology/STEMI.

## 2018-11-15 NOTE — Progress Notes (Addendum)
[facility] [place] NAME: Charles Rangel    MR#:  [id number]  DATE OF BIRTH:  1976-09-30  SUBJECTIVE:  CHIEF COMPLAINT:   Chief Complaint  Patient presents with  . Shortness of Breath   Patient reports less shortness of breath, has been ambulating to the bathroom to use urinal with no issue and plans to walk more in hallway with nursing today. Mild headache, described as "pressure" at the forehead and back of head., states he gets headaches like this at home when blood pressures are running high and he does not take PO analgesics.   REVIEW OF SYSTEMS:  Review of Systems  Constitutional: Negative for chills, diaphoresis and fever.  Eyes: Negative for double vision and pain.  Respiratory: Negative for cough, shortness of breath and wheezing.   Cardiovascular: Positive for orthopnea (patient slept in recliner last night, also does so at baseline) and leg swelling (somewhat improved from yesterday per patient). Negative for chest pain, palpitations, claudication and PND.  Gastrointestinal: Negative for abdominal pain.  Skin: Negative for itching. Neurological: Negative for dizziness and weakness.  Psychiatric/Behavioral: The patient is not nervous/anxious and does not have insomnia.    DRUG ALLERGIES:   Allergies  Allergen Reactions  . Tylenol [Acetaminophen] Anaphylaxis  . Dog Epithelium Allergy Skin Test Hives  . Apple Hives  . Other Hives    RAISINS   VITALS:  Blood pressure (!) 154/96, pulse 79, temperature 98.4 F (36.9 C), temperature source Oral, resp. rate 18, height '5\' 6"'  (1.676 m), weight (!) 145.6 kg, SpO2 96 %. PHYSICAL EXAMINATION:  Physical Exam Constitutional:      General: He is not in acute distress.    Appearance: He is obese. He is not ill-appearing, toxic-appearing or diaphoretic.  HENT:     Head: Normocephalic and atraumatic.  Eyes:     Extraocular Movements: Extraocular movements intact.     Pupils:  Pupils are equal, round, and reactive to light.  Neck:     Vascular: No hepatojugular reflux or JVD. However, difficult to appreciate due to body habitus. Cardiovascular:     Rate and Rhythm: Normal rate and regular rhythm.     Pulses: Normal pulses.     Heart sounds: Normal heart sounds. No murmur. No friction rub. No gallop.   Pulmonary:     Effort: Pulmonary effort is normal at rest, increased work of breathing when speaking.    Breath sounds: Examination of the right-lower field reveals decreased breath sounds. Decreased breath sounds present. No wheezing, rhonchi or rales.  Abdominal:     Tenderness: There is no abdominal tenderness. There is no guarding or rebound.  Musculoskeletal:     Right lower leg: He exhibits no tenderness. Edema present.     Left lower leg: He exhibits no tenderness. Edema present.  Skin:    General: Skin is warm.     Findings: Rash (stasis dermatitis bilateral lower extremity) present. Chronic.  Neurological:     General: No focal deficit present.     Mental Status: He is alert and oriented to person, place, and time.  Psychiatric:        Mood and Affect: Mood normal. Mood is not anxious.        Behavior: Behavior normal. Behavior is not agitated.   LABORATORY PANEL:  Male CBC Recent Labs  Lab 11/14/18 1543  WBC 6.5  HGB 14.0  HCT 44.7  PLT 208   ------------------------------------------------------------------------------------------------------------------ Chemistries  Recent Labs  Lab 11/14/18 1543 11/15/18 0511  NA 140 142  K 3.8 4.0  CL 102 102  CO2 33* 32  GLUCOSE 153* 111*  BUN 16 16  CREATININE 1.12 1.01  CALCIUM 10.2 10.1  AST 18  --   ALT 16  --   ALKPHOS 90  --   BILITOT 0.8  --     Intake/Output Summary (Last 24 hours) at 11/15/2018 1207 Last data filed at 11/15/2018 1100 Gross per 24 hour  Intake 120 ml  Output 2400 ml  Net -2280 ml    RADIOLOGY:  Dg Chest 2 View  Result Date: 11/14/2018 CLINICAL DATA:   Shortness of breath and left-sided chest pain. EXAM: CHEST - 2 VIEW COMPARISON:  06/28/2018 FINDINGS: Mildly enlarged cardiac silhouette. Mediastinal contours appear intact. There is no evidence of pleural effusion or pneumothorax. Patchy interstitial and alveolar opacities are present diffusely throughout both lungs. The pattern of distribution is similar to the last chest radiograph dated 06/28/2018. Osseous structures are without acute abnormality. Soft tissues are grossly normal. IMPRESSION: Probable mixed pulmonary edema, not significantly changed from the last radiograph, dated September 2019. Mildly enlarged cardiac silhouette. Electronically Signed   By: Fidela Salisbury M.D.   On: 11/14/2018 15:50   ASSESSMENT AND PLAN:   Charles Rangel  is a 43 y.o. male with a known history of chronic diastolic heart failure, COPD, diabetes, obstructive sleep apnea comes to the emergency room after he started to have increasing shortness of breath. Patient ran out of his Bumex four days prior to visiting ED. Patient receiving IV Lasix, he is motivated to discharge home pending improvement of shortness of breath, ambulation tolerance, and decreased lower extremity edema.   Plan: 1. Acute on chronic diastolic congestive heart failure secondary to running out of diuretics -continue telemetry -IV Lasix -resume Bumex at discharge -continue cardiac meds -start carvedilol for additional heart failure management, hypertension  2. Diabetes  -sliding scale and metformin  3. Obstructive sleep apnea patient uses CPAP at home -continue CPAP  4. Hypertension Patient on lisinopril 5 mg, blood pressures above goal during admission -start carvedilol 12.5 mg for BP management  5. Tension headache Unchanged from previous headaches at home per patient, likely secondary to HTN He states he cannot take PO analgesics, prefers to see if it remits with BP control, will monitor.  6. Obesity BMI 51.79, patient  states he weighed 299 lbs before Christmas and is motivated to lose weight again. Met with Dietician today to discuss lifestyle modifications. Education provided, will continue to counsel.   7. DVT prophylaxis subcu Lovenox    Patient seen and examined - Agree with PA's note above. - some SOB although improved, sitting in chair and ambulates up to bathroom  ROS: all neg except SOB P/E: Morbidly Obese Lungs: Decreased Breath Sounds at bases, difficult exam due to body habitus Cardiac: Regular Rate and Rhythm Abd: No tenderness, Obese   Imp/Plan:  * Acute on Chronic Diastolic CHF - Due to running out of meds/non-compliance - neg 2 liters fluid balance - continue Diuresis with Lasix for now - monitor BMET - echo in 05/19 showed EF 60-65% - c/s Cardio  * DM: - c/s DM nurse - continue metformin, SSI for now - check A1c  * HTN - continue Lisinopril and Lasix. Add Coreg for better BP control  * OSA - Compliance with CPAP encouraged  All the records are reviewed and case is discussed with Care Management/Social Worker. Management plans discussed with the patient  and they are in agreement.  CODE STATUS: Full Code  TOTAL TIME TAKING CARE OF THIS PATIENT: 30 minutes.   More than 50% of the time was spent in counseling/coordination of care: YES  POSSIBLE D/C IN 1-2 DAYS, DEPENDING ON CLINICAL CONDITION, improving fluid status.   Joana Lule PA-C on 11/15/2018 at 11:37 AM  Between 7am to 6pm - Pager - [phone]  After 6 pm go to [email or web address] - Technical brewer [place] [facility]  Office  [phone]  CC: Primary care physician; [place]

## 2018-11-15 NOTE — Care Management Note (Signed)
Case Management Note  Patient Details  Name: Rayen Siri MRN: [id number] Date of Birth: 09-Sep-1976  Subjective/Objective:     Patient is from home with wife.  Admitted with acute on chronic CHF.    Action/Plan:  Patient ran out of Bumex X 4 days because it was delivered to his apartment office and when he went to get it the office was closed.  Patient has a functioning scale and states he weighs on a regular basis.  He uses a CPAP at home; has a heart failure clinic appointment.  He states he has never been before.  Obtains medications through the [place] or [facility].  Current with his PCP.  Offered HH services and patient declines.   No further needs identified at this time.                 Expected Discharge Date:                  Expected Discharge Plan:  Home/Self Care  In-House Referral:     Discharge planning Services  CM Consult, HF Clinic  Post Acute Care Choice:    Choice offered to:     DME Arranged:    DME Agency:     HH Arranged:  Patient Refused HH HH Agency:     Status of Service:  Completed, signed off  If discussed at [employer] of Stay Meetings, dates discussed:    Additional Comments:  Sherren Kerns, RN 11/15/2018, 10:07 AM

## 2018-11-16 DIAGNOSIS — M109 Gout, unspecified: Secondary | ICD-10-CM | POA: Diagnosis present

## 2018-11-16 DIAGNOSIS — R0602 Shortness of breath: Secondary | ICD-10-CM

## 2018-11-16 DIAGNOSIS — J449 Chronic obstructive pulmonary disease, unspecified: Secondary | ICD-10-CM

## 2018-11-16 LAB — GLUCOSE, CAPILLARY
GLUCOSE-CAPILLARY: 110 mg/dL — AB (ref 70–99)
Glucose-Capillary: 93 mg/dL (ref 70–99)
Glucose-Capillary: 202 mg/dL — ABNORMAL HIGH (ref 70–99)
Glucose-Capillary: 118 mg/dL — ABNORMAL HIGH (ref 70–99)

## 2018-11-16 LAB — BASIC METABOLIC PANEL
Anion gap: 9 (ref 5–15)
BUN: 23 mg/dL — ABNORMAL HIGH (ref 6–20)
CO2: 32 mmol/L (ref 22–32)
Calcium: 10.1 mg/dL (ref 8.9–10.3)
Chloride: 97 mmol/L — ABNORMAL LOW (ref 98–111)
Creatinine, Ser: 1.16 mg/dL (ref 0.61–1.24)
GFR calc Af Amer: >60 mL/min (ref >60)
GFR calc non Af Amer: >60 mL/min (ref >60)
GLUCOSE: 120 mg/dL — AB (ref 70–99)
Potassium: 3.9 mmol/L (ref 3.5–5.1)
Sodium: 138 mmol/L (ref 135–145)

## 2018-11-16 MED ORDER — COLCHICINE 0.6 MG PO TABS
0.6000 mg | ORAL_TABLET | Freq: Every day | ORAL | Status: DC
  Administered 2018-11-16 – 2018-11-17 (×2): 0.6 mg via ORAL
  Filled 2018-11-16 (×2): qty 1

## 2018-11-16 MED ORDER — COLCHICINE 0.6 MG PO TABS: 0.6000 mg | ORAL_TABLET | Freq: Two times a day (BID) | ORAL | Status: DC

## 2018-11-16 MED ORDER — CARVEDILOL 25 MG PO TABS: 25.0000 mg | ORAL_TABLET | Freq: Two times a day (BID) | ORAL | Status: DC

## 2018-11-16 MED ORDER — METOPROLOL SUCCINATE ER 100 MG PO TB24
100.0000 mg | ORAL_TABLET | Freq: Every day | ORAL | Status: DC
  Administered 2018-11-16 – 2018-11-17 (×2): 100 mg via ORAL
  Filled 2018-11-16 (×2): qty 1

## 2018-11-16 MED ORDER — LISINOPRIL 20 MG PO TABS
40.0000 mg | ORAL_TABLET | Freq: Every day | ORAL | Status: DC
  Administered 2018-11-17: 40 mg via ORAL
  Filled 2018-11-16: qty 2

## 2018-11-16 MED ORDER — FUROSEMIDE 10 MG/ML IJ SOLN
INTRAMUSCULAR | Status: AC
  Filled 2018-11-16: qty 8

## 2018-11-16 NOTE — Care Management (Signed)
Barrier to DC-IV lasix 60mg  BID

## 2018-11-16 NOTE — Plan of Care (Signed)
  Problem: Clinical Measurements: Goal: Ability to maintain clinical measurements within normal limits will improve Outcome: Not Progressing Note:  Patient's B.U.N. is elevated today at 23. Will continue to monitor renal status. Charles Rangel [facility]

## 2018-11-16 NOTE — Progress Notes (Addendum)
Progress Note  Patient Name: Charles Rangel Date of Encounter: 11/16/2018  Primary Cardiologist: Lorine Bears, MD   Subjective   Patient denied cardiac symptoms of chest pain, palpitations, or racing heart rate.  He denied SOB or orthopnea, despite documented as sleeping in a recliner overnight.  He does not feel as if he is holding onto fluid despite continued LEE on exam.  At the time of exam, he is eating lunch in the chair next to his bed. He reported that he has never had BP issues in the past, and he attributed his elevated BP to losing his job back before Christmas 2019.  Inpatient Medications    Scheduled Meds: . allopurinol  100 mg Oral Daily  . aspirin EC  81 mg Oral Daily  . carvedilol  12.5 mg Oral BID WC  . cholecalciferol  1,000 Units Oral Daily  . colchicine  0.6 mg Oral Daily  . enoxaparin (LOVENOX) injection  40 mg Subcutaneous Q12H  . furosemide      . furosemide  60 mg Intravenous BID  . insulin aspart  0-9 Units Subcutaneous TID WC  . lisinopril  10 mg Oral Daily  . metFORMIN  1,000 mg Oral BID WC  . sodium chloride flush  3 mL Intravenous Q12H   Continuous Infusions: . sodium chloride     PRN Meds: sodium chloride, docusate sodium, ondansetron (ZOFRAN) IV, sodium chloride flush, traMADol   Vital Signs    Vitals:   11/15/18 2300 11/16/18 0406 11/16/18 0407 11/16/18 0738  BP:   (!) 153/105 (!) 156/99  Pulse: (!) 110  94 97  Resp: 20  20 19   Temp:   98.3 F (36.8 C) 98 F (36.7 C)  TempSrc:    Oral  SpO2: 94%  96% 94%  Weight:  (!) 143.7 kg    Height:        Intake/Output Summary (Last 24 hours) at 11/16/2018 1217 Last data filed at 11/16/2018 0410 Gross per 24 hour  Intake 240 ml  Output 1550 ml  Net -1310 ml   Filed Weights   11/14/18 1510 11/15/18 0442 11/16/18 0406  Weight: (!) 146.1 kg (!) 145.6 kg (!) 143.7 kg    Telemetry    Sinus tachycardia with rates 90s-low 100s - Personally Reviewed  ECG    No new tracings -  Personally Reviewed  Physical Exam   GEN: No acute distress.  Eating lunch Neck: JVD not assessed d/t sitting in chair and body habitus Cardiac: RRR, no murmurs, rubs, or gallops.  Respiratory: Clear to auscultation bilaterally. Distant breath sounds GI: Obese, nontender, non-distended  MS: 2+ bilateral LEE; No deformity. Neuro:  Nonfocal  Psych: Normal affect   Labs    Chemistry Recent Labs  Lab 11/14/18 1543 11/15/18 0511 11/16/18 0249  NA 140 142 138  K 3.8 4.0 3.9  CL 102 102 97*  CO2 33* 32 32  GLUCOSE 153* 111* 120*  BUN 16 16 23*  CREATININE 1.12 1.01 1.16  CALCIUM 10.2 10.1 10.1  PROT 7.0  --   --   ALBUMIN 3.9  --   --   AST 18  --   --   ALT 16  --   --   ALKPHOS 90  --   --   BILITOT 0.8  --   --   GFRNONAA >60 >60 >60  GFRAA >60 >60 >60  ANIONGAP 5 8 9      Hematology Recent Labs  Lab 11/14/18 1543  WBC 6.5  RBC 5.66  HGB 14.0  HCT 44.7  MCV 79.0*  MCH 24.7*  MCHC 31.3  RDW 19.2*  PLT 208    Cardiac Enzymes Recent Labs  Lab 11/14/18 1543  TROPONINI <0.03   No results for input(s): TROPIPOC in the last 168 hours.   BNP Recent Labs  Lab 11/14/18 1543  BNP 43.0     DDimer No results for input(s): DDIMER in the last 168 hours.   Radiology    Dg Chest 2 View  Result Date: 11/14/2018 CLINICAL DATA:  Shortness of breath and left-sided chest pain. EXAM: CHEST - 2 VIEW COMPARISON:  06/28/2018 FINDINGS: Mildly enlarged cardiac silhouette. Mediastinal contours appear intact. There is no evidence of pleural effusion or pneumothorax. Patchy interstitial and alveolar opacities are present diffusely throughout both lungs. The pattern of distribution is similar to the last chest radiograph dated 06/28/2018. Osseous structures are without acute abnormality. Soft tissues are grossly normal. IMPRESSION: Probable mixed pulmonary edema, not significantly changed from the last radiograph, dated September 2019. Mildly enlarged cardiac silhouette.  Electronically Signed   By: Ted Mcalpineobrinka  Dimitrova M.D.   On: 11/14/2018 15:50    Cardiac Studies   TTE 03/05/2018 Study Conclusions - Procedure narrative: The only obtainable view was parasternal.   Transthoracic echocardiography. The study was technically   difficult. - Left ventricle: There was moderate concentric hypertrophy.   Systolic function was vigorous. The estimated ejection fraction   was in the range of 65% to 70%. The study is not technically   sufficient to allow evaluation of LV diastolic function. - Left atrium: The atrium was mildly dilated. - Pulmonary arteries: Systolic pressure could not be accurately   estimated.   Patient Profile     43 y.o. male with history of HFpEF, morbid obesity, obstructive sleep apnea with broken CPAP, hypertension, diabetes, right lower extremity DVT in the setting of prolonged travel, remote tobacco abuse, and COPD.  Assessment & Plan    Acute on chronic diastolic CHF -Previous history of HFpEF with normal EF by echo 02/2018 as above and pending updated echo, ordered today.  Ran out of Bumex on 1/23 and noted 12 pound weight gain over the weekend with associated worsening lower extremity edema and dyspnea on exertion.  CXR in ED on 1/26 with pulmonary edema. [facility] Responding well to diuresis with only slight bump in renal function since escalation to 60 mg twice daily. Will continue IV diuresis with plan to reassess BMET in AM as remains volume overloaded on exam. - Elevated blood pressure in the 140s to 150s at home and since admission.  Metoprolol switched to carvedilol since admission.  Will increase BB to Coreg 25mg  BID today with continued elevated BP and room in HR with rates into the 110s. Lisinopril increased yesterday to 10 mg daily. Pending updated echo. As previously noted, CHF education needed.  Dietary and medication compliance including CPAP encouraged.  - Will need outpatient follow-up at which time can escalate  lisinopril further pending BP response/ labs.  HTN - Elevated BP since admission. Lisinopril increased as above. Continue carvedilol at increased dosage and IV diuresis as above. Given chronically elevated BP, ordered updated echo to reassess LVEF as likely chronically elevated BP not well controlled, as well as OSA without CPAP compliance given its broken. As above, recommend further escalation in lisinopril as needed at outpatient reassessment of BP and labs.   DM2 - Continue SSI/PTA metformin. Per IM  OSA - CPAP reportedly  broken for 1-2 months. Encouraged replacement to prevent worsening HF. Pending updated echo. Recommend addressing CPAP issue and follow-up as outpatient.     For questions or updates, please contact [facility] Please consult [email or web address] for contact info under        Signed, Lennon Alstrom, PA-C  11/16/2018, 12:17 PM

## 2018-11-16 NOTE — Progress Notes (Signed)
Inpatient Diabetes Program Recommendations  AACE/ADA: New Consensus Statement on Inpatient Glycemic Control (2015)  Target Ranges:  Prepandial:   less than 140 mg/dL      Peak postprandial:   less than 180 mg/dL (1-2 hours)      Critically ill patients:  140 - 180 mg/dL   Results for LAMONTA, FRADETTE (MRN [id number]) as of 11/16/2018 09:58  Ref. Range 11/15/2018 07:45 11/15/2018 11:55 11/15/2018 16:21 11/15/2018 21:00  Glucose-Capillary Latest Ref Range: 70 - 99 mg/dL 96 888 (H) 916 (H) 945 (H)   Results for AUDON, MOHNEY (MRN [id number]) as of 11/16/2018 09:58  Ref. Range 11/16/2018 07:40  Glucose-Capillary Latest Ref Range: 70 - 99 mg/dL 93    Admit with: Acute on chronic diastolic congestive heart failure secondary to running out of diuretics  History: DM, CHF, COPD  Home DM Meds: Metformin 1000 mg BID  Current Orders: Novolog Sensitive Correction Scale/ SSI (0-9 units) TID AC      Metformin 1000 mg BID     Last A1c was 5.1% back on 03/05/2018.  Current A1c level pending.  CBGs well controlled so far in hospital.  No recommendations at this time.    --Will follow patient during hospitalization--  Ambrose Finland RN, MSN, CDE Diabetes Coordinator Inpatient Glycemic Control Team Team Pager: [phone] (8a-5p)

## 2018-11-16 NOTE — Progress Notes (Signed)
[place] [facility] [place] NAME: Charles Rangel    MR#:  [id number]  DATE OF BIRTH:  08-05-1976  SUBJECTIVE:  CHIEF COMPLAINT:   Chief Complaint  Patient presents with  . Shortness of Breath   Patient slept in and sitting in the recliner, ambulating to bathroom. Headache improved. Shortness of breath present but improved. States has been ambulating around nurses station, short of breath on exertion and limited by pain from acute gout flare in left ankle.   REVIEW OF SYSTEMS:  Review of Systems  Constitutional: Negative for chills, fever and malaise/fatigue.  Respiratory: Positive for shortness of breath. Negative for cough and wheezing.   Cardiovascular: Positive for orthopnea (continues to sleep in recliner, at baseline) and leg swelling. Negative for chest pain, palpitations and claudication.  Gastrointestinal: Negative for abdominal pain, constipation and diarrhea.  Musculoskeletal: Positive for joint pain (Flare gout L ankle).  Skin: Positive for rash (chronic stasis dermatitis).  Neurological: Negative for weakness and headaches.  Psychiatric/Behavioral: The patient does not have insomnia.     DRUG ALLERGIES:   Allergies  Allergen Reactions  . Tylenol [Acetaminophen] Anaphylaxis  . Dog Epithelium Allergy Skin Test Hives  . Apple Hives  . Other Hives    RAISINS   VITALS:  Blood pressure (!) 156/99, pulse 97, temperature 98 F (36.7 C), temperature source Oral, resp. rate 19, height '5\' 6"'  (1.676 m), weight (!) 143.7 kg, SpO2 94 %. PHYSICAL EXAMINATION:  Physical Exam Constitutional:      General: He is not in acute distress.    Appearance: He is obese. He is not ill-appearing, toxic-appearing or diaphoretic.  HENT:     Head: Normocephalic and atraumatic.  Eyes:     Extraocular Movements: Extraocular movements intact.     Pupils: Pupils are equal, round, and reactive to light.  Neck:     Vascular: No hepatojugular reflux or  JVD. However, difficult to appreciate due to body habitus. Cardiovascular:     Rate and Rhythm: Normal rate and regular rhythm.     Pulses: Normal pulses.     Heart sounds: Normal heart sounds. No murmur. No friction rub. No gallop.   Pulmonary:     Effort: Pulmonary effort is normal at rest, mild increased work of breathing when speaking.    Breath sounds: Examination of the right-lower field reveals normal breath sounds, diminished secondary to body habitus. No wheezing, rhonchi or rales.  Abdominal:     Tenderness: There is no abdominal tenderness. There is no guarding or rebound.  Musculoskeletal:     Right lower leg: He exhibits no tenderness. Edema present. 1+ pitting     Left lower leg: He exhibits tenderness on palpation of the left ankle anteriorly. Ankle is erythematous. Non-fluctuant.  Edema present. Trace pitting  Skin:    General: Skin is warm.     Findings: Rash (stasis dermatitis bilateral lower extremity) present. Chronic.  Neurological:     General: No focal deficit present.     Mental Status: He is alert and oriented to person, place, and time.  Psychiatric:        Mood and Affect: Mood normal. Mood is not anxious.        Behavior: Behavior normal. Behavior is not agitated.  LABORATORY PANEL:  Male CBC Recent Labs  Lab 11/14/18 1543  WBC 6.5  HGB 14.0  HCT 44.7  PLT 208   ------------------------------------------------------------------------------------------------------------------ Chemistries  Recent Labs  Lab 11/14/18 1543  11/16/18  0249  NA 140   < > 138  K 3.8   < > 3.9  CL 102   < > 97*  CO2 33*   < > 32  GLUCOSE 153*   < > 120*  BUN 16   < > 23*  CREATININE 1.12   < > 1.16  CALCIUM 10.2   < > 10.1  AST 18  --   --   ALT 16  --   --   ALKPHOS 90  --   --   BILITOT 0.8  --   --    < > = values in this interval not displayed.    Intake/Output Summary (Last 24 hours) at 11/16/2018 1249 Last data filed at 11/16/2018 0410 Gross per 24 hour    Intake 240 ml  Output 1550 ml  Net -1310 ml   Filed Weights   11/14/18 1510 11/15/18 0442 11/16/18 0406  Weight: (!) 146.1 kg (!) 145.6 kg (!) 143.7 kg    RADIOLOGY:  Dg Chest 2 View  Result Date: 11/14/2018 CLINICAL DATA:  Shortness of breath and left-sided chest pain. EXAM: CHEST - 2 VIEW COMPARISON:  06/28/2018 FINDINGS: Mildly enlarged cardiac silhouette. Mediastinal contours appear intact. There is no evidence of pleural effusion or pneumothorax. Patchy interstitial and alveolar opacities are present diffusely throughout both lungs. The pattern of distribution is similar to the last chest radiograph dated 06/28/2018. Osseous structures are without acute abnormality. Soft tissues are grossly normal. IMPRESSION: Probable mixed pulmonary edema, not significantly changed from the last radiograph, dated September 2019. Mildly enlarged cardiac silhouette. Electronically Signed   By: Fidela Salisbury M.D.   On: 11/14/2018 15:50  ASSESSMENT AND PLAN:   KendrickGilliamis a75 y.o.malewith a known history of chronic diastolic heart failure, COPD, diabetes, obstructive sleep apnea who came to the emergency room after he started to have increasing shortness of breath. Patient ran out of his Bumexfour days prior to visiting ED. Patient receiving IV Lasix, he is motivated to discharge home pending improvement of shortness of breath, ambulation tolerance, and decreased lower extremity edema.   Plan: 1.Acute on chronic diastolic congestive heart failure secondary to running out of diuretics Remains volume-overloaded - Due to running out of meds/non-compliance - neg 1.3L liters fluid balance  - continue Diuresis with Lasix for now, appreciate c/s from cardio - monitor BMET, BUN elevated to 23 from 16 today. Cardio recommends continuing Lasix at current dose and will monitor renal function. Creatinine within normal limits today.  - echo in 05/19 showed EF 60-65%  2.Diabetes  - c/s DM  nurse: no recommendations at this time, CBGs well controlled in hospital - continue metformin, SSI for now - check A1c, pending, (last 5.1% on 03/06/2019)  3. Gout Flare - acute gout flare left ankle, allopurinol started yesterday, continue colchicine Will monitor  4.Obstructive sleep apnea patient uses CPAP at home -continue CPAP, home CPAP has been broken 1-2 months and patient has been encouraged to get replacement to prevent worsening HF  5. Hypertension Continues to be above goal -increased carvedilol from 12.5 mg to 25 mg, continue lisinopril 10 mg, Lasix for now for BP management with renal monitoring as above.  6. Tension headache -likely secondary to HTN, improved from yesterday per patient today. Will monitor.  7. Obesity -BMI 51.79, patient states he weighed 299 lbs before Christmas and is motivated to lose weight again. Dry weight about 300-310 lbs. Met with Dietician to discuss lifestyle modifications. Education provided, will continue to counsel.  -  will need to discuss patient's access to a scale at home as discharge approaches  8.DVT prophylaxis subcu Lovenox   All the records are reviewed and case is discussed with Care Management/Social Worker. Management plans discussed with the patient and/or family and they are in agreement.  CODE STATUS: Full Code  TOTAL TIME TAKING CARE OF THIS PATIENT: 30 minutes.   More than 50% of the time was spent in counseling/coordination of care: YES  POSSIBLE D/C IN 1-2 DAYS, DEPENDING ON CLINICAL CONDITION.   Koa Zoeller PA-C on 11/16/2018 at 12:49 PM  Between 7am to 6pm - Pager - [phone]  After 6 pm go to [email or web address] - Technical brewer [facility]  Office  [phone]  CC: Primary care physician; [place]

## 2018-11-16 NOTE — Progress Notes (Signed)
Patient has already seen the "EMMI" videos back in September, and he didn't want to watch them again this admission. Informed patient to let me know if he changes his mind later in the shift. Will continue to offer education PRN. Jari Favre [facility]

## 2018-11-17 ENCOUNTER — Inpatient Hospital Stay (HOSPITAL_COMMUNITY)
Admit: 2018-11-17 | Discharge: 2018-11-17 | Disposition: A | Discharge status: Home / Self Care | Payer: PRIVATE HEALTH INSURANCE | Attending: Physician Assistant | Admitting: Physician Assistant

## 2018-11-17 DIAGNOSIS — I509 Heart failure, unspecified: Secondary | ICD-10-CM

## 2018-11-17 DIAGNOSIS — I5033 Acute on chronic diastolic (congestive) heart failure: Secondary | ICD-10-CM

## 2018-11-17 DIAGNOSIS — M109 Gout, unspecified: Secondary | ICD-10-CM

## 2018-11-17 LAB — HEMOGLOBIN A1C
HEMOGLOBIN A1C: 5.3 % (ref 4.8–5.6)
Mean Plasma Glucose: 105 mg/dL

## 2018-11-17 LAB — CBC
HCT: 47.6 % (ref 39.0–52.0)
Hemoglobin: 15 g/dL (ref 13.0–17.0)
MCH: 24.5 pg — AB (ref 26.0–34.0)
MCHC: 31.5 g/dL (ref 30.0–36.0)
MCV: 77.7 fL — ABNORMAL LOW (ref 80.0–100.0)
Platelets: 230 10*3/uL (ref 150–400)
RBC: 6.13 MIL/uL — AB (ref 4.22–5.81)
RDW: 19.2 % — ABNORMAL HIGH (ref 11.5–15.5)
WBC: 6 10*3/uL (ref 4.0–10.5)
nRBC: 0 % (ref 0.0–0.2)

## 2018-11-17 LAB — BASIC METABOLIC PANEL
Anion gap: 9 (ref 5–15)
BUN: 26 mg/dL — ABNORMAL HIGH (ref 6–20)
CALCIUM: 10.1 mg/dL (ref 8.9–10.3)
CO2: 32 mmol/L (ref 22–32)
Chloride: 96 mmol/L — ABNORMAL LOW (ref 98–111)
Creatinine, Ser: 1.26 mg/dL — ABNORMAL HIGH (ref 0.61–1.24)
GFR calc Af Amer: >60 mL/min (ref >60)
GFR calc non Af Amer: >60 mL/min (ref >60)
GLUCOSE: 115 mg/dL — AB (ref 70–99)
Potassium: 4 mmol/L (ref 3.5–5.1)
Sodium: 137 mmol/L (ref 135–145)

## 2018-11-17 LAB — ECHOCARDIOGRAM COMPLETE
ECHOEF: 60 %
Height: 66 in
Weight: 5028.8 oz

## 2018-11-17 LAB — GLUCOSE, CAPILLARY
Glucose-Capillary: 96 mg/dL (ref 70–99)
Glucose-Capillary: 131 mg/dL — ABNORMAL HIGH (ref 70–99)

## 2018-11-17 MED ORDER — BUMETANIDE 1 MG PO TABS
2.0000 mg | ORAL_TABLET | Freq: Two times a day (BID) | ORAL | Status: DC
  Filled 2018-11-17: qty 2

## 2018-11-17 MED ORDER — FUROSEMIDE 10 MG/ML IJ SOLN
60.0000 mg | Freq: Two times a day (BID) | INTRAMUSCULAR | Status: DC
  Administered 2018-11-17: 60 mg via INTRAVENOUS
  Filled 2018-11-17: qty 6

## 2018-11-17 MED ORDER — BUMETANIDE 2 MG PO TABS: 2.0000 mg | ORAL_TABLET | Freq: Two times a day (BID) | ORAL | 0 refills | Status: DC

## 2018-11-17 MED ORDER — ALLOPURINOL 100 MG PO TABS: 100.0000 mg | ORAL_TABLET | Freq: Every day | ORAL | 0 refills | Status: DC

## 2018-11-17 MED ORDER — LISINOPRIL 40 MG PO TABS: 40.0000 mg | ORAL_TABLET | Freq: Every day | ORAL | 0 refills | Status: DC

## 2018-11-17 MED ORDER — PREDNISONE 10 MG (21) PO TBPK: ORAL_TABLET | ORAL | 0 refills | Status: DC

## 2018-11-17 MED ORDER — FUROSEMIDE 10 MG/ML IJ SOLN: 60.0000 mg | Freq: Two times a day (BID) | INTRAMUSCULAR | Status: DC

## 2018-11-17 MED ORDER — PERFLUTREN LIPID MICROSPHERE
1.0000 mL | INTRAVENOUS | Status: AC | PRN
  Administered 2018-11-17: 3 mL via INTRAVENOUS
  Filled 2018-11-17: qty 10

## 2018-11-17 NOTE — Discharge Instructions (Signed)
You were hospitalized because your heart failure worsened short-term after you ran out of your water pill, and your body started to held on to too much fluid. This made it more difficult to breathe and caused leg swelling.   To manage this you will continue to take Bumex (bumetadine) 2 mg twice daily.  To manage your blood pressure you will take Lisinopril 40 mg once daily and Metoprolol 100 mg daily. Please follow-up with your primary care provider and cardiologist for continued monitoring of labs and response to these medications.  We have provided you with 30 days refill for these medications, please see your PCP for more refills as needed.  One of the most important things for managing your heart failure is to get your CPAP replaced and use it every night while sleeping. To do this, you will have to contact the [place].  It is also very important to work on healthy lifestyle modifications like we discussed in the hospital, including making healthy diet choices and trying to do light exercise, like starting 15-30 minutes of light walking at least 5 days a week then exercising more over time as you tolerate it.   It is also very important to weigh yourself at home with a scale. If you gain more than 2-3 pounds overnight, or more than 5 pounds in one week, please call your cardiologist or primary care doctor.   For your acute gout flare, you will take a temporary taper of a steroid, prednisone, for anti-inflammatory management of your gout episode. It will come as a 21 pack of 10 mg pills that you will take with these instructions:   Taper instructions:Take 6 tablets (60 mg total) on 1/29, take 5 tablets (50 mg total) on 1/30, take 4 tablets (40 mg total) on 1/31, take 3 tablets (30 mg total) on 2/1, take 2 tablets (20 mg total) on 2/2, take 1 tablet (10 mg total) on 2/3. To manage your gout flares at home, continue to take allopurinol 100 mg daily and colchicine 0.6 mg daily. Please follow-up with your  primary doctor to manage this.

## 2018-11-17 NOTE — Progress Notes (Signed)
*  PRELIMINARY RESULTS* Echocardiogram 2D Echocardiogram has been performed.  Joanette Gula Sallye Lunz 11/17/2018, 9:27 AM

## 2018-11-17 NOTE — Discharge Summary (Addendum)
[facility] [place] NAME: Charles Rangel    MR#:  [id number]  DATE OF BIRTH:  11/13/1975  DATE OF ADMISSION:  11/14/2018   ADMITTING PHYSICIAN: Fritzi Mandes, MD  DATE OF DISCHARGE: 11/17/2018  PRIMARY CARE PHYSICIAN: [facility]   ADMISSION DIAGNOSIS:  SOB (shortness of breath) [R06.02] Acute on chronic congestive heart failure, unspecified heart failure type ([place]) [I50.9] DISCHARGE DIAGNOSIS:  Principal Problem:   Acute on chronic diastolic CHF (congestive heart failure) (HCC) Active Problems:   CHF (congestive heart failure) ([place])   Gout  SECONDARY DIAGNOSIS:   Past Medical History:  Diagnosis Date  . (HFpEF) heart failure with preserved ejection fraction ([place])    a. 02/2018 Echo: EF 65-70%, mildly dil LA.  Marland Kitchen COPD (chronic obstructive pulmonary disease) ([place])   . Diabetes mellitus without complication ([place])   . DVT (deep venous thrombosis) ([place])   . Gout   . Hypertension   . Morbid obesity ([place])   . OSA (obstructive sleep apnea)    HOSPITAL COURSE:   KendrickGilliamis a55 y.o.malewith a known history of chronic diastolic heart failure, COPD, diabetes, obstructive sleep apneawho cameto the emergency room after he startedto haveincreasing shortness of breath. Patientranout of his Bumexfour daysprior to visiting ED.Patient received IV Lasix, fluid status improved and transitioned to home medication BUMEX 2 mg twice daily.  Plan: 1.Acute on chronic diastolic congestive heart failure secondary to running out of diuretics - Due to running out of meds/non-compliance - Weight down to 142.6 kg on 1/29 from 143.7 kg on 1/28 - Bumex 2 mg twice daily -monitor BMET outpatient at follow-up, BUN elevated to 26 from 23 today.Cardio recommends continuing diuresis. Creatinine elevated to 1.26 1/29 from 1.16 1/29 - ECHO 1/29 shows EF > 60%  2.Diabetes  - continue home metformin, SSI used inpatient, CBGs well  controlled in hospital - A1c 5.3% (last 5.1% on 03/06/2019)  3. Gout Flare; improving per patient but limiting ambulation - acute gout flare left ankle, continue colchicine and allopurinol - prednisone taper provided for acute flare - patient has a walker at home that he will use for ambulation while pain persists  - symptomatic care with ice, elevating affected ankle discussed - will need to follow-up with PCP - appointment can be placed for future Rheumatology appointment through PCP at the [facility] if needed  4.Obstructive sleep apnea patient uses CPAP at home - continue CPAP, home CPAP has been broken 1-2 months and patient has been encouraged to get replacement to prevent worsening HF. Patient will contact the [facility] for replacement as it must go through them  5. Hypertension Pressures above goal during hospital course, at goal with new regimen (see below) - continue lisinopril 40 mg, metoprolol 100 mg per cardio recs - will need PCP follow-up to assess success of this regimen, BMET monitoring as above  6. Tension headache - resolved, likely secondary to BP being above goal inpatient while patient was fluid overloaded  7. Obesity - BMI 51.79, patient states he weighed 299 lbs before Christmas and is motivated to lose weight again.Dry weight about 300-310 lbs. MetwithDieticiantodiscuss lifestyle modifications as he said his diet was poor. Education provided,continue to counsel. - patient states that he has a functioning scale at home and will weight himself regularly.  DISCHARGE CONDITIONS:  stable CONSULTS OBTAINED:  Treatment Team:  Saundra Shelling, MD Wellington Hampshire, MD Minna Merritts, MD DRUG ALLERGIES:   Allergies  Allergen Reactions  . Tylenol [  Acetaminophen] Anaphylaxis  . Dog Epithelium Allergy Skin Test Hives  . Apple Hives  . Other Hives    RAISINS   DISCHARGE MEDICATIONS:   Allergies as of 11/17/2018      Reactions   Tylenol [acetaminophen]  Anaphylaxis   Dog Epithelium Allergy Skin Test Hives   Apple Hives   Other Hives   RAISINS      Medication List    STOP taking these medications   carvedilol 25 MG tablet Commonly known as:  COREG   docusate sodium 100 MG capsule Commonly known as:  COLACE     TAKE these medications   allopurinol 100 MG tablet Commonly known as:  ZYLOPRIM Take 1 tablet (100 mg total) by mouth daily. Start taking on:  November 18, 2018   aspirin EC 81 MG tablet Take 81 mg daily by mouth.   bumetanide 2 MG tablet Commonly known as:  BUMEX Take 1 tablet (2 mg total) by mouth 2 (two) times daily for 30 days. What changed:  when to take this   colchicine 0.6 MG tablet Take 1 tablet (0.6 mg total) by mouth daily.   lisinopril 40 MG tablet Commonly known as:  PRINIVIL,ZESTRIL Take 1 tablet (40 mg total) by mouth daily for 30 doses. Start taking on:  November 18, 2018 What changed:    medication strength  how much to take   metFORMIN 500 MG tablet Commonly known as:  GLUCOPHAGE Take 1,000 mg by mouth 2 (two) times daily with a meal.   metoprolol succinate 100 MG 24 hr tablet Commonly known as:  TOPROL-XL Take 100 mg by mouth daily. Take with or immediately following a meal.   predniSONE 10 MG (21) Tbpk tablet Commonly known as:  STERAPRED UNI-PAK 21 TAB Take tabs: 6 (60 mg) on 1/29, 5 (50 mg) on 1/30, 4 (40 mg) on 1/31, 3 (30 mg) on 2/1, 2 (20 mg) on 2/2, 1 (10 mg) on 2/3.   traMADol 50 MG tablet Commonly known as:  ULTRAM Take 1 tablet (50 mg total) by mouth every 6 (six) hours as needed.   Vitamin D3 25 MCG (1000 UT) Caps Take 1 capsule by mouth daily.       DISCHARGE INSTRUCTIONS:   DIET:  Cardiac diet DISCHARGE CONDITION:  Stable ACTIVITY:  Activity as tolerated OXYGEN:  Home Oxygen: No.  Oxygen Delivery: room air DISCHARGE LOCATION:  home   If you experience worsening of your admission symptoms, develop shortness of breath, life threatening emergency,  suicidal or homicidal thoughts you must seek medical attention immediately by calling 911 or calling your MD immediately if your symptoms are severe.  You Must read complete instructions/literature along with all the possible adverse reactions/side effects for all the medicines you take and that have been prescribed to you. Take any new medicines only after you have completely understood and accept all the possible adverse reactions/side effects.   Please note  You were cared for by a hospitalist during your hospital stay. If you have any questions about your discharge medications or the care you received while you were in the hospital after you are discharged, you can call the unit and asked to speak with the hospitalist on call if the hospitalist that took care of you is not available. Once you are discharged, your primary care physician will handle any further medical issues. Please note that NO REFILLS for any discharge medications will be authorized once you are discharged, as it is imperative that you return  to your primary care physician (or establish a relationship with a primary care physician if you do not have one) for your aftercare needs so that they can reassess your need for medications and monitor your lab values.   On the day of Discharge:  VITAL SIGNS:  Blood pressure 115/85, pulse 83, temperature (!) 97.4 F (36.3 C), temperature source Oral, resp. rate 18, height '5\' 6"'  (1.676 m), weight (!) 142.6 kg, SpO2 95 %. PHYSICAL EXAMINATION:  Physical Exam Constitutional:  General: He is not in acute distress. Appearance: He is obese. He is notill-appearing,toxic-appearingor diaphoretic.  HENT:  Head: Normocephalicand atraumatic.  Eyes:  Extraocular Movements: Extraocular movements intact.  Pupils: Pupils are equal, round, and reactive to light.  Neck:  Vascular: No hepatojugular refluxor JVD.However, difficult to appreciate due to body  habitus. Cardiovascular:  Rate and Rhythm: Normal rateand regular rhythm.  Pulses: Normal pulses.  Heart sounds: Normal heart sounds.No murmur. Nofriction rub. Nogallop.  Pulmonary:  Effort: Pulmonary effort is normal at rest,mildlyincreased work of breathing when speaking. Breath sounds: Examination of the right-lower field reveals normal breath sounds, diminished secondary to body habitus. No wheezing,rhonchior rales.  Abdominal:  Tenderness: There is no abdominal tenderness. There is noguardingor rebound.  Musculoskeletal:  Right lower leg: He exhibits no tenderness.Edemapresent.1+ pitting Left lower leg:He exhibits tenderness on palpation of the left ankle anteriorly.Ankle is erythematous. Non-fluctuant.Edemapresent.1+ pitting. Skin: General: Skin is warm.  Findings: Rash(stasis dermatitis bilateral lower extremity)present. Chronic. Neurological:  General: No focal deficitpresent.  Mental Status: He is alertand oriented to person, place, and time.  Psychiatric:  Mood and Affect: Moodnormal. Mood is not anxious.  Behavior: Behaviornormal. Behavior is not agitated. DATA REVIEW:   CBC Recent Labs  Lab 11/17/18 0306  WBC 6.0  HGB 15.0  HCT 47.6  PLT 230    Chemistries  Recent Labs  Lab 11/14/18 1543  11/17/18 0306  NA 140   < > 137  K 3.8   < > 4.0  CL 102   < > 96*  CO2 33*   < > 32  GLUCOSE 153*   < > 115*  BUN 16   < > 26*  CREATININE 1.12   < > 1.26*  CALCIUM 10.2   < > 10.1  AST 18  --   --   ALT 16  --   --   ALKPHOS 90  --   --   BILITOT 0.8  --   --    < > = values in this interval not displayed.      RADIOLOGY:  Procedures: Echocardiogram - 11/17/18  Result status: Final result  Normal LV size and function ejection fraction estimated greater than 60% Normal RV size and function No significant valve disease Grade 1 diastolic dysfunction    Management plans discussed  with the patient and/or family and they are in agreement.  CODE STATUS: Full Code   TOTAL TIME TAKING CARE OF THIS PATIENT: 45 minutes.   Vitalia Stough PA-C on 11/17/2018 at 3:09 PM  Between 7am to 6pm - Pager - [phone]  After 6pm go to [email or web address] - [employer] [facility]  Office  [phone]  CC: Primary care physician; [place]

## 2018-11-17 NOTE — Progress Notes (Signed)
[facility] [place] NAME: Charles Rangel    MR#:  [id number]  DATE OF BIRTH:  1976-04-13  SUBJECTIVE:  CHIEF COMPLAINT:   Chief Complaint  Patient presents with  . Shortness of Breath   Continues to have pain in L ankle due to gout flare, improved from yesterday. States his shortness of breath is improving. No other concerns. He is eager to d/c home.  REVIEW OF SYSTEMS:  Review of Systems  Constitutional: Negative for chills, fever and malaise/fatigue.  Respiratory: Positive for shortness of breath on exertion. Negative for cough and wheezing.  Negative for shortness of breath at rest. Cardiovascular: Positive for orthopnea (continues to sleep in recliner, at baseline) and leg swelling. Negative for chest pain, palpitations and claudication.  Gastrointestinal: Negative for abdominal pain, constipation and diarrhea.  Musculoskeletal: Positive for joint pain (Flare gout L ankle), improving from yesterday.  Skin: Positive for rash (chronic stasis dermatitis).  Neurological: Negative for weakness and headaches.  Psychiatric/Behavioral: The patient does not have insomnia.    DRUG ALLERGIES:   Allergies  Allergen Reactions  . Tylenol [Acetaminophen] Anaphylaxis  . Dog Epithelium Allergy Skin Test Hives  . Apple Hives  . Other Hives    RAISINS   VITALS:  Blood pressure 115/85, pulse 83, temperature (!) 97.4 F (36.3 C), temperature source Oral, resp. rate 18, height '5\' 6"'  (1.676 m), weight (!) 142.6 kg, SpO2 95 %. PHYSICAL EXAMINATION:  Physical Exam Constitutional:  General: He is not in acute distress. Appearance: He is obese. He is notill-appearing,toxic-appearingor diaphoretic.  HENT:  Head: Normocephalicand atraumatic.  Eyes:  Extraocular Movements: Extraocular movements intact.  Pupils: Pupils are equal, round, and reactive to light.  Neck:  Vascular: No hepatojugular refluxor JVD.However,  difficult to appreciate due to body habitus. Cardiovascular:  Rate and Rhythm: Normal rateand regular rhythm.  Pulses: Normal pulses.  Heart sounds: Normal heart sounds.No murmur. Nofriction rub. Nogallop.  Pulmonary:  Effort: Pulmonary effort is normal at rest, mildly increased work of breathing when speaking. Breath sounds: Examination of the right-lower field reveals normal breath sounds, diminished secondary to body habitus. No wheezing,rhonchior rales.  Abdominal:  Tenderness: There is no abdominal tenderness. There is noguardingor rebound.  Musculoskeletal:  Right lower leg: He exhibits no tenderness.Edemapresent. 1+ pitting  Left lower leg: He exhibits tenderness on palpation of the left ankle anteriorly. Ankle is erythematous. Non-fluctuant. Edemapresent. 1+ pitting. Skin: General: Skin is warm.  Findings: Rash(stasis dermatitis bilateral lower extremity)present. Chronic. Neurological:  General: No focal deficitpresent.  Mental Status: He is alertand oriented to person, place, and time.  Psychiatric:  Mood and Affect: Moodnormal. Mood is not anxious.  Behavior: Behaviornormal. Behavior is not agitated. LABORATORY PANEL:  Male CBC Recent Labs  Lab 11/17/18 0306  WBC 6.0  HGB 15.0  HCT 47.6  PLT 230   ------------------------------------------------------------------------------------------------------------------ Chemistries  Recent Labs  Lab 11/14/18 1543  11/17/18 0306  NA 140   < > 137  K 3.8   < > 4.0  CL 102   < > 96*  CO2 33*   < > 32  GLUCOSE 153*   < > 115*  BUN 16   < > 26*  CREATININE 1.12   < > 1.26*  CALCIUM 10.2   < > 10.1  AST 18  --   --   ALT 16  --   --   ALKPHOS 90  --   --   BILITOT 0.8  --   --    < > =  values in this interval not displayed.    Intake/Output Summary (Last 24 hours) at 11/17/2018 1228 Last data filed at 11/17/2018 0700 Gross per 24 hour  Intake 0 ml    Output 900 ml  Net -900 ml   BP Readings from Last 3 Encounters:  11/17/18 115/85  07/01/18 (!) 154/108  04/30/18 (!) 149/90   Filed Weights   11/15/18 0442 11/16/18 0406 11/17/18 0322  Weight: (!) 145.6 kg (!) 143.7 kg (!) 142.6 kg    RADIOLOGY:  Procedures: Echocardiogram - 11/17/18  Result status: Final result  Normal LV size and function ejection fraction estimated greater than 60% Normal RV size and function No significant valve disease Grade 1 diastolic dysfunction    ASSESSMENT AND PLAN:   KendrickGilliamis a53 y.o.malewith a known history of chronic diastolic heart failure, COPD, diabetes, obstructive sleep apnea who came to the emergency room after he startedto haveincreasing shortness of breath. Patientranout of his Bumexfour daysprior to visiting ED.Patient receiving IV Lasix, he is motivated to discharge home pending improvement of shortness of breath, ambulation tolerance, and decreased lower extremity edema.   Plan: 1.Acute on chronic diastolic congestive heart failure secondary to running out of diuretics Remains volume-overloaded - Due to running out of meds/non-compliance - Weight down to 142.6 kg on 1/29 from 143.7 kg on 1/28 - continue Diuresis with Lasix for now, cardio following - monitor BMET, BUN elevated to 26 from 23 today. Cardio recommends continuing Lasix at current dose and will monitor renal function. Creatinine elevated to 1.26 1/29 from 1.16 1/29 - ECHO 1/29 shows EF > 60%  2.Diabetes  - DM nurse: no recommendations at this time, CBGs well controlled in hospital - continue metformin, SSI for now - A1c 5.3% (last 5.1% on 03/06/2019)  3. Gout Flare; improving per patient - acute gout flare left ankle, allopurinol started yesterday, continue colchicine  4.Obstructive sleep apnea patient uses CPAP at home -continue CPAP, home CPAP has been broken 1-2 months and patient has been encouraged to get replacement to prevent  worsening HF  5. Hypertension At goal with new regimen - continue lisinopril 40 mg, metoprolol 100 mg per cardio recs, - Lasix for now for BP management with renal monitoring as above.  6. Tension headache - resolved  7. Obesity -BMI 51.79, patient states he weighed 299 lbs before Christmas and is motivated to lose weight again. Dry weight about 300-310 lbs. Met withDietician to discuss lifestyle modifications. Education provided,will continue to counsel.  - will need to discuss patient's access to a scale at home as discharge approaches  8.DVT prophylaxis subcu Lovenox  All the records are reviewed and case is discussed with Care Management/Social Worker. Management plans discussed with the patient and/or family and they are in agreement.  CODE STATUS: Full Code  TOTAL TIME TAKING CARE OF THIS PATIENT: 30 minutes.   More than 50% of the time was spent in counseling/coordination of care: YES  POSSIBLE D/C IN 1-2 DAYS, DEPENDING ON CLINICAL CONDITION; fluid status, advancement from IV diuresis to PO.    Surena Welge PA-C on 11/17/2018 at 12:26 PM  Between 7am to 6pm - Pager - [phone]  After 6 pm go to [email or web address] - [employer] [facility]  Office  [phone]  CC: Primary care physician; [place]

## 2018-11-17 NOTE — Plan of Care (Signed)
Patient is unable to ambulate due to painful left foot. Patient is being treated for gout.

## 2018-11-17 NOTE — Progress Notes (Signed)
Annabell Howells to be D/C'd Home per MD order.  Discussed prescriptions and follow up appointments with the patient. Prescriptions were e-prescribed to [facility], medication list explained in detail. Pt verbalized understanding. Wife at bedside will be transporting pt home.  Allergies as of 11/17/2018      Reactions   Tylenol [acetaminophen] Anaphylaxis   Dog Epithelium Allergy Skin Test Hives   Apple Hives   Other Hives   RAISINS      Medication List    STOP taking these medications   carvedilol 25 MG tablet Commonly known as:  COREG   docusate sodium 100 MG capsule Commonly known as:  COLACE     TAKE these medications   allopurinol 100 MG tablet Commonly known as:  ZYLOPRIM Take 1 tablet (100 mg total) by mouth daily. Start taking on:  November 18, 2018   aspirin EC 81 MG tablet Take 81 mg daily by mouth.   bumetanide 2 MG tablet Commonly known as:  BUMEX Take 1 tablet (2 mg total) by mouth 2 (two) times daily for 30 days. What changed:  when to take this   colchicine 0.6 MG tablet Take 1 tablet (0.6 mg total) by mouth daily.   lisinopril 40 MG tablet Commonly known as:  PRINIVIL,ZESTRIL Take 1 tablet (40 mg total) by mouth daily for 30 doses. Start taking on:  November 18, 2018 What changed:    medication strength  how much to take   metFORMIN 500 MG tablet Commonly known as:  GLUCOPHAGE Take 1,000 mg by mouth 2 (two) times daily with a meal.   metoprolol succinate 100 MG 24 hr tablet Commonly known as:  TOPROL-XL Take 100 mg by mouth daily. Take with or immediately following a meal.   predniSONE 10 MG (21) Tbpk tablet Commonly known as:  STERAPRED UNI-PAK 21 TAB Take tabs: 6 (60 mg) on 1/29, 5 (50 mg) on 1/30, 4 (40 mg) on 1/31, 3 (30 mg) on 2/1, 2 (20 mg) on 2/2, 1 (10 mg) on 2/3.   traMADol 50 MG tablet Commonly known as:  ULTRAM Take 1 tablet (50 mg total) by mouth every 6 (six) hours as needed.   Vitamin D3 25 MCG (1000 UT) Caps Take 1 capsule  by mouth daily.       Vitals:   11/17/18 0730 11/17/18 0810  BP: 115/85   Pulse: 83   Resp:    Temp: (!) 97.4 F (36.3 C)   SpO2: (!) 88% 95%    Tele box removed and returned. Skin clean, dry and intact without evidence of skin break down, no evidence of skin tears noted. IV catheter discontinued intact. Site without signs and symptoms of complications. Dressing and pressure applied. Pt denies pain at this time. No complaints noted.  An After Visit Summary was printed and given to the patient. Patient escorted via WC, and D/C home via private auto.  [name]

## 2018-11-17 NOTE — Progress Notes (Signed)
Progress Note  Patient Name: Charles Rangel Date of Encounter: 11/17/2018  Primary Cardiologist: Lorine Bears, MD   Subjective   Continues to deny chest pain, palpitations, and racing HR.   SOB improving and minimal. Orthopnea, leg edema improved .  Inpatient Medications    Scheduled Meds: . allopurinol  100 mg Oral Daily  . aspirin EC  81 mg Oral Daily  . bumetanide  2 mg Oral BID  . cholecalciferol  1,000 Units Oral Daily  . colchicine  0.6 mg Oral Daily  . enoxaparin (LOVENOX) injection  40 mg Subcutaneous Q12H  . insulin aspart  0-9 Units Subcutaneous TID WC  . lisinopril  40 mg Oral Daily  . metFORMIN  1,000 mg Oral BID WC  . metoprolol succinate  100 mg Oral Daily  . sodium chloride flush  3 mL Intravenous Q12H   Continuous Infusions: . sodium chloride     PRN Meds: sodium chloride, docusate sodium, ondansetron (ZOFRAN) IV, sodium chloride flush, traMADol   Vital Signs    Vitals:   11/16/18 1957 11/17/18 0322 11/17/18 0730 11/17/18 0810  BP: (!) 131/96 121/80 115/85   Pulse: 97 88 83   Resp: 20 18    Temp: 98.9 F (37.2 C) 98 F (36.7 C) (!) 97.4 F (36.3 C)   TempSrc: Oral Axillary Oral   SpO2: 92% 95% (!) 88% 95%  Weight:  (!) 142.6 kg    Height:        Intake/Output Summary (Last 24 hours) at 11/17/2018 1333 Last data filed at 11/17/2018 0700 Gross per 24 hour  Intake 0 ml  Output 900 ml  Net -900 ml   Filed Weights   11/15/18 0442 11/16/18 0406 11/17/18 0322  Weight: (!) 145.6 kg (!) 143.7 kg (!) 142.6 kg    Telemetry    SR, rates 80-100s- Personally Reviewed  ECG    No new tracings - Personally Reviewed  Physical Exam   GEN: Morbidly obese male. No acute distress.   Neck: Unable to assess d/t body habitus Cardiac: RRR, no murmurs, rubs, or gallops.  Respiratory: Reduced breath sounds bilaterally . GI: Soft, nontender, non-distended  MS: Improving bilateral lower extremity edema: minimal to 1+; No deformity. Neuro:   Nonfocal  Psych: Normal affect   Labs    Chemistry Recent Labs  Lab 11/14/18 1543 11/15/18 0511 11/16/18 0249 11/17/18 0306  NA 140 142 138 137  K 3.8 4.0 3.9 4.0  CL 102 102 97* 96*  CO2 33* 32 32 32  GLUCOSE 153* 111* 120* 115*  BUN 16 16 23* 26*  CREATININE 1.12 1.01 1.16 1.26*  CALCIUM 10.2 10.1 10.1 10.1  PROT 7.0  --   --   --   ALBUMIN 3.9  --   --   --   AST 18  --   --   --   ALT 16  --   --   --   ALKPHOS 90  --   --   --   BILITOT 0.8  --   --   --   GFRNONAA >60 >60 >60 >60  GFRAA >60 >60 >60 >60  ANIONGAP 5 8 9 9      Hematology Recent Labs  Lab 11/14/18 1543 11/17/18 0306  WBC 6.5 6.0  RBC 5.66 6.13*  HGB 14.0 15.0  HCT 44.7 47.6  MCV 79.0* 77.7*  MCH 24.7* 24.5*  MCHC 31.3 31.5  RDW 19.2* 19.2*  PLT 208 230    Cardiac Enzymes  Recent Labs  Lab 11/14/18 1543  TROPONINI <0.03   No results for input(s): TROPIPOC in the last 168 hours.   BNP Recent Labs  Lab 11/14/18 1543  BNP 43.0     DDimer No results for input(s): DDIMER in the last 168 hours.   Radiology    No results found.  Cardiac Studies   Pending updated TTE   TTE 03/05/2018 Study Conclusions - Procedure narrative: The only obtainable view was parasternal. Transthoracic echocardiography. The study was technically difficult. - Left ventricle: There was moderate concentric hypertrophy. Systolic function was vigorous. The estimated ejection fraction was in the range of 65% to 70%. The study is not technically sufficient to allow evaluation of LV diastolic function. - Left atrium: The atrium was mildly dilated. - Pulmonary arteries: Systolic pressure could not be accurately estimated.   Patient Profile     43 y.o. male with history of HFpEF, morbid obesity, obstructive sleep apnea with broken CPAP, hypertension, diabetes, right lower extremity DVT in the setting of prolonged travel, remote tobacco abuse, and COPD seen today for acute on chronic  CHF.  Assessment & Plan    Acute on chronic diastolic CHF -SOB, orthopnea, leg edema reported yet improving. CXR pulmonary edema. Ran out of Bumex on 1/23 and noted 12 pound weight gain over the weekend with associated worsening lower extremity edema and dyspnea on exertion. Followed at [facility]. - Rate more controlled today. Previously elevated BP now controlled. Pending updated echo in setting of recent uncontrolled HTN with volume overload.  - Continue medical management with Toprol XL 100mg  po qd, asa, lisinopril 40mg  daily, and bumex 2mg  po bid. Daily BMET while admitted. Dietary and medication compliance stressed to patient. (Ran out of meds, broken CPAP). Weight loss and dietary /exercise / lifestyle changes encouraged. Will need outpatient follow-up including BMET/ labs following discharge.   HTN - Controlled today. Continue medical management as above.   DM2 - Continue metformin/SSI. Per IM  OSA - CPAP reportedly broken for 1-2 months. Encouraged replacement to prevent worsening HF. Pending updated echo. Recommend addressing CPAP issue and follow-up as outpatient.   Morbid obesity - Weight loss advised. Poor diet. Recommend nutrition consult   For questions or updates, please contact [facility] Please consult [email or web address] for contact info under        Signed, Lennon Alstrom, PA-C  11/17/2018, 1:33 PM

## 2018-11-17 NOTE — Progress Notes (Signed)
Cardiology and Pulmonary Nurse Navigator Note:    43 year old male with known hx of chronic diastolic heart failure, COPD, DM, OSA, who presented to the ED with increasing SOB. Patient ran out of his Bumex 4 days prior to presenting to ED.   ? CHF Education:?? Educational session with patient completed.   Patient has booklet "Living Better with Heart Failure" packet at bedside.  Patient has refused to watch videos per assigned RN yesterday due to watching the CHF videos during his hospitalization in September 2019.   ? *Reviewed importance of and reason behind checking weight daily in the AM, after using the bathroom, but before getting dressed. Patient has scales.  ? *Reviewed with patient the following information: *Discussed when to call the Dr= weight gain of >2-3lb overnight of 5lb in a week,  *Discussed yellow zone= call MD: weight gain of >2-3lb overnight of 5lb in a week, increased swelling, increased SOB when lying down, chest discomfort, dizziness, increased fatigue *Red Zone= call 911: struggle to breath, fainting or near fainting, significant chest pain   *Diet - Reviewed low sodium diet-provided handout of recommended and not recommended foods.?Dietitian Consultation for diet education has been completed this admission.   ? *Discussed fluid intake with patient as well. Patient is currently on a 1200 ml fluid restriction.  Patient reported this is an area where he needs to improve, as he drinks too many liquids at home.      *Instructed patient to take medications as prescribed for heart failure. Explained briefly why pt is on the medications (either make you feel better, live longer or keep you out of the hospital) and discussed monitoring and side effects.  ? *Discussed exercise. Note:  Patient has gout of the left ankle at the moment.  Patient does not currently exercise.  Explained to patient with his dx of CHF he is a candidate for Cardiac Rehab. Overview of the program provided.   Patient is a Cytogeneticist.  (This RN thanked the patient for his service.)  Further explained that Cardiac Rehab would be covered by his [employer] benefits.  Patient stated he might be interested in Cardiac Rehab. Brochure provided.  Instructed patient to discuss with his PCP / Cardiologist at the [place].   ? *Smoking Cessation- Patient is a former smoker.? ? *[facility] - Explained the purpose of the HF Clinic. ?Explained to patient the HF Clinic does not replace PCP nor Cardiologist, but is an additional resource to helping patient manage heart failure at home.  Also, stressed the point that it would be helpful for him to have a local provider who he could see for his heart failure.  Patient  Agreeable to being followed in the [facility].  Patient's new patient appointment is scheduled for 11/22/2018 at 1 p.m.  Brochure provided.   ? Again, the 5 Steps to Living Better with Heart Failure were reviewed with patient.  ? Patient thanked me for providing the above information. ? ? Army Melia, RN, BSN, [facility]? [facility]  [facility] Cardiac &?Pulmonary Rehab  Cardiovascular &?Pulmonary Nurse Navigator  Direct Line: [phone]  Department Phone #: [phone] Fax: [phone]? Email Address: [email or web address]

## 2018-11-20 NOTE — Progress Notes (Signed)
Patient ID: Charles HowellsKendrick Rangel, male    DOB: 1976/08/21, 43 y.o.   MRN: [id number]  HPI  Charles Rangel is a 43 y/o male with a history of Dm, HTN, COPD, OSA, DVT, gout, obesity, previous tobacco use and chronic heart failure.   Echo report from 11/17/2018 reviewed and showed an EF of 60-65%.  Admitted 11/14/2018 due to acute on chronic HF due to running out of diuretics. Cardiology consult obtained. Medications adjusted and he was discharged after 3 days.   He presents today for his initial visit with a chief complaint of minimal shortness of breath upon moderate exertion. He describes this as chronic in nature having been present for several months. He has associated pedal edema, palpitations and headaches. He denies any difficulty sleeping, dizziness, abdominal distention, chest pain, cough, fatigue or weight gain. Continues to have some issues with gout in his left ankle. Has CPAP but says that it's currently not working correctly. Taking his last dose of prednisone today.   Past Medical History:  Diagnosis Date  . (HFpEF) heart failure with preserved ejection fraction (HCC)    a. 02/2018 Echo: EF 65-70%, mildly dil LA.  . CHF (congestive heart failure) (HCC)   . COPD (chronic obstructive pulmonary disease) (HCC)   . Diabetes mellitus without complication (HCC)   . DVT (deep venous thrombosis) (HCC)   . Gout   . Hypertension   . Morbid obesity (HCC)   . OSA (obstructive sleep apnea)    Past Surgical History:  Procedure Laterality Date  . Left leg surgery for fracture     Family History  Problem Relation Age of Onset  . Hypertension Mother   . Hypertension Father   . CVA Father 6445   Social History   Tobacco Use  . Smoking status: Former Smoker    Packs/day: 1.00    Years: 20.00    Pack years: 20.00    Last attempt to quit: 10/20/2010    Years since quitting: 8.0  . Smokeless tobacco: Never Used  Substance Use Topics  . Alcohol use: Yes    Frequency: Never    Comment: rarely,  not in 1.5 years   Allergies  Allergen Reactions  . Tylenol [Acetaminophen] Anaphylaxis  . Dog Epithelium Allergy Skin Test Hives  . Apple Hives  . Other Hives    RAISINS   Prior to Admission medications   Medication Sig Start Date End Date Taking? Authorizing Provider  allopurinol (ZYLOPRIM) 100 MG tablet Take 1 tablet (100 mg total) by mouth daily. 11/18/18  Yes Lule, Joana, PA  aspirin EC 81 MG tablet Take 81 mg daily by mouth.   Yes [provider]  bumetanide (BUMEX) 2 MG tablet Take 1 tablet (2 mg total) by mouth 2 (two) times daily for 30 days. 11/17/18 12/17/18 Yes Lule, Joana, PA  Cholecalciferol (VITAMIN D3) 1000 units CAPS Take 4 capsules by mouth daily.    Yes [provider]  colchicine 0.6 MG tablet Take 1 tablet (0.6 mg total) by mouth daily. 07/01/18  Yes Gouru, Deanna ArtisAruna, MD  lisinopril (PRINIVIL,ZESTRIL) 40 MG tablet Take 1 tablet (40 mg total) by mouth daily for 30 doses. 11/18/18 12/18/18 Yes Lule, Joana, PA  metFORMIN (GLUCOPHAGE) 500 MG tablet Take 1,000 mg by mouth 2 (two) times daily with a meal.    Yes [provider]  metoprolol succinate (TOPROL-XL) 100 MG 24 hr tablet Take 100 mg by mouth daily. Take with or immediately following a meal.   Yes [provider]  predniSONE (STERAPRED UNI-PAK 21 TAB) 10 MG (21) TBPK tablet Take tabs: 6 (60 mg) on 1/29, 5 (50 mg) on 1/30, 4 (40 mg) on 1/31, 3 (30 mg) on 2/1, 2 (20 mg) on 2/2, 1 (10 mg) on 2/3. 11/17/18  Yes Lule, Joana, PA    Review of Systems  Constitutional: Negative for appetite change and fatigue.  HENT: Negative for congestion, rhinorrhea and sore throat.   Eyes: Negative.   Respiratory: Positive for shortness of breath (with moderate exertion). Negative for cough and chest tightness.   Cardiovascular: Positive for palpitations (when climbing stairs) and leg swelling (left ankle). Negative for chest pain.  Gastrointestinal: Negative for abdominal distention and abdominal pain.   Endocrine: Negative.   Genitourinary: Negative.   Musculoskeletal: Positive for arthralgias (left foot).  Skin: Negative.   Allergic/Immunologic: Negative.   Neurological: Positive for headaches (today). Negative for dizziness and light-headedness.  Hematological: Negative for adenopathy. Does not bruise/bleed easily.  Psychiatric/Behavioral: Negative for dysphoric mood and sleep disturbance (sleeping well). The patient is not nervous/anxious.    Vitals:   11/22/18 1309  BP: 137/88  Pulse: 94  Resp: 18  SpO2: 100%  Weight: (!) 315 lb 2 oz (142.9 kg)  Height: 5\' 6"  (1.676 m)   Wt Readings from Last 3 Encounters:  11/22/18 (!) 315 lb 2 oz (142.9 kg)  11/17/18 (!) 314 lb 4.8 oz (142.6 kg)  07/01/18 (!) 323 lb 9.6 oz (146.8 kg)   Lab Results  Component Value Date   CREATININE 1.26 (H) 11/17/2018   CREATININE 1.16 11/16/2018   CREATININE 1.01 11/15/2018     Physical Exam Vitals signs and nursing note reviewed.  Constitutional:      Appearance: Normal appearance.  HENT:     Head: Normocephalic and atraumatic.  Neck:     Musculoskeletal: Normal range of motion and neck supple.  Cardiovascular:     Rate and Rhythm: Normal rate and regular rhythm.  Pulmonary:     Effort: Pulmonary effort is normal. No respiratory distress.     Breath sounds: No wheezing or rales.  Abdominal:     General: There is no distension.     Palpations: Abdomen is soft.  Musculoskeletal:        General: Tenderness (around left ankle) present.     Right lower leg: No edema.     Left lower leg: No edema.  Skin:    General: Skin is warm and dry.  Neurological:     General: No focal deficit present.     Mental Status: He is alert and oriented to person, place, and time.  Psychiatric:        Mood and Affect: Mood normal.        Behavior: Behavior normal.    Assessment & Plan:  1: Chronic heart failure with preserved ejection fraction- - NYHA class II - euvolemic today - weighing daily and  he was reminded to call for an overnight weight gain of >2 pounds or a weekly weight gain of >5 pounds - not adding salt but hasn't been reading food labels much. Reviewed the importance of reading food labels and how to read food labels so that he can keep his daily sodium intake to 2000mg  daily. Written dietary information was given to him about this as well - sees cardiology at the [facility] but has a NP appointment with Eula Listen 12/09/2018 - BNP 11/14/2018 was 43.0 - he does not get the flu vaccine; emphasized good handwashing -  advised him to see what company's name is on his CPAP so that he can call them and have them come service his equipment  2: HTN- - BP looks good today - [facility] BMP 11/17/2018 reviewed and showed sodium 137, potassium 4.0, creatinine 1.26 and GFR >60  3: DM- - A1c 11/15/2018 was 5.3% - glucose has been running 103-115 at home  Patient did not bring his medications nor a list. Each medication was verbally reviewed with the patient and he was encouraged to bring the bottles to every visit to confirm accuracy of list.  Return in 2 months or sooner for any questions/problems before then.

## 2018-11-22 ENCOUNTER — Ambulatory Visit: Payer: No Typology Code available for payment source | Attending: Family | Admitting: Family

## 2018-11-22 ENCOUNTER — Encounter: Payer: Self-pay | Admitting: Family

## 2018-11-22 VITALS — BP 137/88 | HR 94 | Resp 18 | Ht 66.0 in | Wt 315.1 lb

## 2018-11-22 DIAGNOSIS — Z886 Allergy status to analgesic agent status: Secondary | ICD-10-CM | POA: Diagnosis not present

## 2018-11-22 DIAGNOSIS — Z91048 Other nonmedicinal substance allergy status: Secondary | ICD-10-CM | POA: Insufficient documentation

## 2018-11-22 DIAGNOSIS — Z6841 Body Mass Index (BMI) 40.0 and over, adult: Secondary | ICD-10-CM | POA: Diagnosis not present

## 2018-11-22 DIAGNOSIS — E119 Type 2 diabetes mellitus without complications: Secondary | ICD-10-CM | POA: Diagnosis not present

## 2018-11-22 DIAGNOSIS — Z8249 Family history of ischemic heart disease and other diseases of the circulatory system: Secondary | ICD-10-CM | POA: Insufficient documentation

## 2018-11-22 DIAGNOSIS — I110 Hypertensive heart disease with heart failure: Secondary | ICD-10-CM | POA: Diagnosis not present

## 2018-11-22 DIAGNOSIS — I5032 Chronic diastolic (congestive) heart failure: Secondary | ICD-10-CM | POA: Insufficient documentation

## 2018-11-22 DIAGNOSIS — Z79899 Other long term (current) drug therapy: Secondary | ICD-10-CM | POA: Diagnosis not present

## 2018-11-22 DIAGNOSIS — G4733 Obstructive sleep apnea (adult) (pediatric): Secondary | ICD-10-CM | POA: Diagnosis not present

## 2018-11-22 DIAGNOSIS — I10 Essential (primary) hypertension: Secondary | ICD-10-CM

## 2018-11-22 DIAGNOSIS — Z87891 Personal history of nicotine dependence: Secondary | ICD-10-CM | POA: Insufficient documentation

## 2018-11-22 DIAGNOSIS — Z7982 Long term (current) use of aspirin: Secondary | ICD-10-CM | POA: Diagnosis not present

## 2018-11-22 DIAGNOSIS — J449 Chronic obstructive pulmonary disease, unspecified: Secondary | ICD-10-CM | POA: Diagnosis not present

## 2018-11-22 DIAGNOSIS — Z7984 Long term (current) use of oral hypoglycemic drugs: Secondary | ICD-10-CM | POA: Insufficient documentation

## 2018-11-22 DIAGNOSIS — Z86718 Personal history of other venous thrombosis and embolism: Secondary | ICD-10-CM | POA: Insufficient documentation

## 2018-11-22 DIAGNOSIS — Z7952 Long term (current) use of systemic steroids: Secondary | ICD-10-CM | POA: Insufficient documentation

## 2018-11-22 DIAGNOSIS — M109 Gout, unspecified: Secondary | ICD-10-CM | POA: Diagnosis not present

## 2018-11-22 DIAGNOSIS — Z91018 Allergy to other foods: Secondary | ICD-10-CM | POA: Diagnosis not present

## 2018-11-22 NOTE — Patient Instructions (Signed)
Continue weighing daily and call for an overnight weight gain of > 2 pounds or a weekly weight gain of >5 pounds. 

## 2018-11-23 ENCOUNTER — Encounter: Payer: Self-pay | Admitting: Family

## 2018-11-23 DIAGNOSIS — I10 Essential (primary) hypertension: Secondary | ICD-10-CM | POA: Insufficient documentation

## 2018-11-23 DIAGNOSIS — E119 Type 2 diabetes mellitus without complications: Secondary | ICD-10-CM | POA: Insufficient documentation

## 2018-12-07 NOTE — Progress Notes (Deleted)
Cardiology Office Note Date:  12/07/2018  Patient ID:  Charles Rangel, DOB Dec 11, 1975, MRN [id number] PCP:  [facility]  Cardiologist:  Dr. Kirke Corin, MD  ***refresh   Chief Complaint: Hospital follow up  History of Present Illness: Charles Rangel is a 43 y.o. male with history of chronic diastolic CHF, remote tobacco abuse, DM2, COPD, remote right lower extremity DVT in the setting of prolonged travel, hypertension, obesity, sleep apnea, and gout who presents for hospital follow-up after recent admission to [facility] from 1/26 through 1/29 for ***.  In 02/2018 he was admitted to the hospital with acute on chronic respiratory failure secondary to COPD flare and mild CHF.  Echo showed normal LV systolic function.  He is followed at the [facility] as an outpatient.  He was readmitted in 06/2018 in the setting of heart failure and gout.  He has previously noted the ability to exercise 4 to 5 days/week on the treadmill at 2 to 2.5 mph for 1 hour without symptoms of dyspnea on exertion.  His CPAP has been broken for at least several months.  He has reported a dry weight between 310 and 312 pounds and has a history of dietary and discretion, frequently eating out or bringing fast food home.  He was admitted to the hospital in late 10/2018 after running out of his Bumex.  Chest x-ray did show interstitial edema though BNP was normal at 43.  Cardiac enzymes negative.  Echo on 11/17/2018 showed an EF of 60 to 65%, mild LVH, diastolic dysfunction, no significant valvular abnormality.  With diuresis, symptoms improved.  Discharge weight documented to be 313 pounds.  Discharge labs showed a slightly uptrending serum creatinine from 1.16-1.26 with a BUN trending from 23-26, potassium 4.0, WBC 6.0, Hgb 15, PLT 230.  Discharge medications: Medication List    STOP taking these medications   carvedilol 25 MG tablet Commonly known as:  COREG   docusate sodium 100 MG capsule Commonly known as:   COLACE     TAKE these medications   allopurinol 100 MG tablet Commonly known as:  ZYLOPRIM Take 1 tablet (100 mg total) by mouth daily. Start taking on:  November 18, 2018   aspirin EC 81 MG tablet Take 81 mg daily by mouth.   bumetanide 2 MG tablet Commonly known as:  BUMEX Take 1 tablet (2 mg total) by mouth 2 (two) times daily for 30 days. What changed:  when to take this   colchicine 0.6 MG tablet Take 1 tablet (0.6 mg total) by mouth daily.   lisinopril 40 MG tablet Commonly known as:  PRINIVIL,ZESTRIL Take 1 tablet (40 mg total) by mouth daily for 30 doses. Start taking on:  November 18, 2018 What changed:    medication strength  how much to take   metFORMIN 500 MG tablet Commonly known as:  GLUCOPHAGE Take 1,000 mg by mouth 2 (two) times daily with a meal.   metoprolol succinate 100 MG 24 hr tablet Commonly known as:  TOPROL-XL Take 100 mg by mouth daily. Take with or immediately following a meal.   predniSONE 10 MG (21) Tbpk tablet Commonly known as:  STERAPRED UNI-PAK 21 TAB Take tabs: 6 (60 mg) on 1/29, 5 (50 mg) on 1/30, 4 (40 mg) on 1/31, 3 (30 mg) on 2/1, 2 (20 mg) on 2/2, 1 (10 mg) on 2/3.   traMADol 50 MG tablet Commonly known as:  ULTRAM Take 1 tablet (50 mg total) by mouth every 6 (  six) hours as needed.   Vitamin D3 25 MCG (1000 UT) Caps Take 1 capsule by mouth daily.      ***   Past Medical History:  Diagnosis Date  . (HFpEF) heart failure with preserved ejection fraction (HCC)    a. 02/2018 Echo: EF 65-70%, mildly dil LA.  . CHF (congestive heart failure) (HCC)   . COPD (chronic obstructive pulmonary disease) (HCC)   . Diabetes mellitus without complication (HCC)   . DVT (deep venous thrombosis) (HCC)   . Gout   . Hypertension   . Morbid obesity (HCC)   . OSA (obstructive sleep apnea)     Past Surgical History:  Procedure Laterality Date  . Left leg surgery for fracture      No outpatient medications have been marked  as taking for the 12/09/18 encounter (Appointment) with Sondra Barges, PA-C.    Allergies:   Tylenol [acetaminophen]; Dog epithelium allergy skin test; Apple; and Other   Social History:  The patient  reports that he quit smoking about 8 years ago. He has a 20.00 pack-year smoking history. He has never used smokeless tobacco. He reports current alcohol use. He reports that he does not use drugs.   Family History:  The patient's family history includes CVA (age of onset: 68) in his father; Hypertension in his father and mother.  ROS:   ROS   PHYSICAL EXAM: *** VS:  There were no vitals taken for this visit. BMI: There is no height or weight on file to calculate BMI.  Physical Exam   EKG:  Was ordered and interpreted by me today. Shows ***  Recent Labs: 03/05/2018: Magnesium 2.1 11/14/2018: ALT 16; B Natriuretic Peptide 43.0 11/17/2018: BUN 26; Creatinine, Ser 1.26; Hemoglobin 15.0; Platelets 230; Potassium 4.0; Sodium 137  No results found for requested labs within last 8760 hours.   CrCl cannot be calculated (Unknown ideal weight.).   Wt Readings from Last 3 Encounters:  11/22/18 (!) 315 lb 2 oz (142.9 kg)  11/17/18 (!) 314 lb 4.8 oz (142.6 kg)  07/01/18 (!) 323 lb 9.6 oz (146.8 kg)     Other studies reviewed: Additional studies/records reviewed today include: summarized above  ASSESSMENT AND PLAN:  1. ***  Disposition: F/u with Dr. Kirke Corin or an APP in ***  Current medicines are reviewed at length with the patient today.  The patient did not have any concerns regarding medicines.  Signed, Eula Listen, PA-C 12/07/2018 8:48 AM     [facility] [place] [address] [place], [place] [address] [phone]

## 2018-12-09 ENCOUNTER — Ambulatory Visit: Payer: No Typology Code available for payment source | Admitting: Physician Assistant

## 2018-12-10 ENCOUNTER — Encounter: Payer: Self-pay | Admitting: Physician Assistant

## 2019-02-01 ENCOUNTER — Ambulatory Visit: Payer: No Typology Code available for payment source | Admitting: Family

## 2019-03-28 ENCOUNTER — Telehealth: Payer: Self-pay

## 2019-03-28 NOTE — Telephone Encounter (Signed)
   TELEPHONE CALL NOTE  This patient has been deemed a candidate for follow-up tele-health visit to limit community exposure during the Covid-19 pandemic. I spoke with the patient via phone to discuss instructions. The patient was advised to review the section on consent for treatment as well. The patient will receive a phone call 2-3 days prior to their E-Visit at which time consent will be verbally confirmed. A Virtual Office Visit appointment type has been scheduled for 03/29/2019 with Darylene Price FNP.  Gaylord Shih, CMA 03/28/2019 10:42 AM

## 2019-03-28 NOTE — Telephone Encounter (Signed)
TELEPHONE CALL NOTE  Charles Rangel has been deemed a candidate for a follow-up tele-health visit to limit community exposure during the Covid-19 pandemic. I spoke with the patient via phone to ensure availability of phone/video source, confirm preferred email & phone number, discuss instructions and expectations, and review consent.   I reminded Charles Rangel to be prepared with any vital sign and/or heart rhythm information that could potentially be obtained via home monitoring, at the time of his visit.  Finally, I reminded Charles Rangel to expect an e-mail containing a link for their video-based visit approximately 15 minutes before his visit, or alternatively, a phone call at the time of his visit if his visit is planned to be a phone encounter.  Did the patient verbally consent to treatment as below? YES  Charles Rangel, CMA 03/28/2019 10:43 AM  CONSENT FOR TELE-HEALTH VISIT - PLEASE REVIEW  I hereby voluntarily request, consent and authorize [facility] and its employed or contracted physicians, physician assistants, nurse practitioners or other licensed health care professionals (the Practitioner), to provide me with telemedicine health care services (the "Services") as deemed necessary by the treating Practitioner. I acknowledge and consent to receive the Services by the Practitioner via telemedicine. I understand that the telemedicine visit will involve communicating with the Practitioner through telephonic communication technology and the disclosure of certain medical information by electronic transmission. I acknowledge that I have been given the opportunity to request an in-person assessment or other available alternative prior to the telemedicine visit and am voluntarily participating in the telemedicine visit.  I understand that I have the right to withhold or withdraw my consent to the use of telemedicine in the course of my care at any time, without affecting  my right to future care or treatment, and that the Practitioner or I may terminate the telemedicine visit at any time. I understand that I have the right to inspect all information obtained and/or recorded in the course of the telemedicine visit and may receive copies of available information for a reasonable fee.  I understand that some of the potential risks of receiving the Services via telemedicine include:  Marland Kitchen Delay or interruption in medical evaluation due to technological equipment failure or disruption; . Information transmitted may not be sufficient (e.g. poor resolution of images) to allow for appropriate medical decision making by the Practitioner; and/or  . In rare instances, security protocols could fail, causing a breach of personal health information.  Furthermore, I acknowledge that it is my responsibility to provide information about my medical history, conditions and care that is complete and accurate to the best of my ability. I acknowledge that Practitioner's advice, recommendations, and/or decision may be based on factors not within their control, such as incomplete or inaccurate data provided by me or lack of visual representation. I understand that the practice of medicine is not an exact science and that Practitioner makes no warranties or guarantees regarding treatment outcomes. I acknowledge that I will receive a copy of this consent concurrently upon execution via email to the email address I last provided but may also request a printed copy by calling the office of [facility].    I understand that my insurance may be billed for this visit.   I have read or had this consent read to me. . I understand the contents of this consent, which adequately explains the benefits and risks of the Services being provided via telemedicine.  . I have been  provided ample opportunity to ask questions regarding this consent and the Services and have had my questions answered to my  satisfaction. . I give my informed consent for the services to be provided through the use of telemedicine in my medical care  By participating in this telemedicine visit I agree to the above.

## 2019-03-29 ENCOUNTER — Ambulatory Visit: Payer: PRIVATE HEALTH INSURANCE | Attending: Family | Admitting: Family

## 2019-03-29 ENCOUNTER — Other Ambulatory Visit: Payer: Self-pay

## 2019-03-29 ENCOUNTER — Encounter: Payer: Self-pay | Admitting: Family

## 2019-03-29 VITALS — Wt 298.2 lb

## 2019-03-29 DIAGNOSIS — I10 Essential (primary) hypertension: Secondary | ICD-10-CM

## 2019-03-29 DIAGNOSIS — E119 Type 2 diabetes mellitus without complications: Secondary | ICD-10-CM

## 2019-03-29 DIAGNOSIS — I5032 Chronic diastolic (congestive) heart failure: Secondary | ICD-10-CM

## 2019-03-29 NOTE — Patient Instructions (Signed)
Continue weighing daily and call for an overnight weight gain of > 2 pounds or a weekly weight gain of >5 pounds. 

## 2019-03-29 NOTE — Progress Notes (Signed)
Virtual Visit via Telephone Note    Evaluation Performed:  Follow-up visit  This visit type was conducted due to national recommendations for restrictions regarding the COVID-19 Pandemic (e.g. social distancing).  This format is felt to be most appropriate for this patient at this time.  All issues noted in this document were discussed and addressed.  No physical exam was performed (except for noted visual exam findings with Video Visits).  Please refer to the patient's chart (MyChart message for video visits and phone note for telephone visits) for the patient's consent to telehealth for [facility]  Date:  03/29/2019   ID:  Charles HowellsKendrick Rangel, DOB 1976/01/23, MRN [id number]  Patient Location:  Driving in his car  Provider location:   [facility] [address] [place], [place] [address]  PCP:  [facility]  Cardiologist:  Lorine BearsMuhammad Arida, MD  Electrophysiologist:  None   Chief Complaint:  fatigue  History of Present Illness:    Charles Rangel is a 43 y.o. male who presents via audio/video conferencing for a telehealth visit today.  Patient verified DOB and address.  The patient does not have symptoms concerning for COVID-19 infection (fever, chills, cough, or new SHORTNESS OF BREATH).   Patient reports minimal fatigue upon moderate exertion. He says that this has been present for several months although he does feel like his energy level is improving. He doesn't have any other symptoms and specifically denies any dizziness, swelling in his legs/ abdomen, palpitations, chest pain, shortness of breath, difficulty sleeping or weight gain. Has two part-time jobs and is doing a lot of walking with these jobs. Still has to get his CPAP looked at so hasn't been wearing it.   Patient is currently driving in his car and doesn't have his medications with him.   Prior CV studies:   The following studies were reviewed today:  Echo report from 11/17/2018  reviewed and showed an EF of 60-65%  Past Medical History:  Diagnosis Date  . (HFpEF) heart failure with preserved ejection fraction (HCC)    a. 02/2018 Echo: EF 65-70%, mildly dil LA.  . CHF (congestive heart failure) (HCC)   . COPD (chronic obstructive pulmonary disease) (HCC)   . Diabetes mellitus without complication (HCC)   . DVT (deep venous thrombosis) (HCC)   . Gout   . Hypertension   . Morbid obesity (HCC)   . OSA (obstructive sleep apnea)    Past Surgical History:  Procedure Laterality Date  . Left leg surgery for fracture       Current Meds  Medication Sig  . allopurinol (ZYLOPRIM) 100 MG tablet Take 1 tablet (100 mg total) by mouth daily.  Marland Kitchen. aspirin EC 81 MG tablet Take 81 mg daily by mouth.  . bumetanide (BUMEX) 2 MG tablet Take 1 tablet (2 mg total) by mouth 2 (two) times daily for 30 days.  . Cholecalciferol (VITAMIN D3) 1000 units CAPS Take 4 capsules by mouth daily.   . colchicine 0.6 MG tablet Take 1 tablet (0.6 mg total) by mouth daily.  Marland Kitchen. lisinopril (PRINIVIL,ZESTRIL) 40 MG tablet Take 1 tablet (40 mg total) by mouth daily for 30 doses.  . metFORMIN (GLUCOPHAGE) 500 MG tablet Take 1,000 mg by mouth 2 (two) times daily with a meal.   . metoprolol succinate (TOPROL-XL) 100 MG 24 hr tablet Take 100 mg by mouth daily. Take with or immediately following a meal.     Allergies:   Tylenol [acetaminophen];  Dog epithelium allergy skin test; Apple; and Other   Social History   Tobacco Use  . Smoking status: Former Smoker    Packs/day: 1.00    Years: 20.00    Pack years: 20.00    Last attempt to quit: 10/20/2010    Years since quitting: 8.4  . Smokeless tobacco: Never Used  Substance Use Topics  . Alcohol use: Yes    Frequency: Never    Comment: rarely, not in 1.5 years  . Drug use: No     Family Hx: The patient's family history includes CVA (age of onset: 80) in his father; Hypertension in his father and mother.  ROS:   Please see the history of present  illness.     All other systems reviewed and are negative.   Labs/Other Tests and Data Reviewed:    Recent Labs: 11/14/2018: ALT 16; B Natriuretic Peptide 43.0 11/17/2018: BUN 26; Creatinine, Ser 1.26; Hemoglobin 15.0; Platelets 230; Potassium 4.0; Sodium 137   Recent Lipid Panel Lab Results  Component Value Date/Time   CHOL 149 05/26/2013 04:11 AM   TRIG 53 05/26/2013 04:11 AM   HDL 51 05/26/2013 04:11 AM   LDLCALC 87 05/26/2013 04:11 AM    Wt Readings from Last 3 Encounters:  03/29/19 298 lb 4 oz (135.3 kg)  11/22/18 (!) 315 lb 2 oz (142.9 kg)  11/17/18 (!) 314 lb 4.8 oz (142.6 kg)     Exam:    Vital Signs:  Wt 298 lb 4 oz (135.3 kg) Comment: self-reported  BMI 48.14 kg/m    Well nourished, well developed male in no  acute distress.   ASSESSMENT & PLAN:    1. Chronic heart failure with preserved ejection fraction- - NYHA class II - euvolemic today based on patient's description of symptoms - weighing daily and he was reminded to call for an overnight weight gain of >2 pounds or a weekly weight gain of >5 pounds - patient says that his weight has been stable - not adding salt and is trying to closely follow a low sodium diet - sees cardiology at the [facility]  - BNP 11/14/2018 was 43.0 - advised him to see what company's name is on his CPAP so that he can call them and have them come service his equipment  2: HTN- - says that his home BP readings have been "good" - sees PCP at the [place] and has an appointment August 2020 - BMP 11/17/2018 reviewed and showed sodium 137, potassium 4.0, creatinine 1.26 and GFR >60  3: DM- - A1c 11/15/2018 was 5.3% - glucose was 112 at home today  COVID-19 Education: The signs and symptoms of COVID-19 were discussed with the patient and how to seek care for testing (follow up with PCP or arrange E-visit).  The importance of social distancing was discussed today.  Patient Risk:   After full review of this patients clinical status, I feel  that they are at least moderate risk at this time.  Time:   Today, I have spent 6 minutes with the patient with telehealth technology discussing weight, diet and symptoms to report.     Medication Adjustments/Labs and Tests Ordered: Current medicines are reviewed at length with the patient today.  Concerns regarding medicines are outlined above.   Tests Ordered: No orders of the defined types were placed in this encounter.  Medication Changes: No orders of the defined types were placed in this encounter.   Disposition: Follow-up in 2 months or sooner for any  questions/problems before then.   Signed, Delma Freezeina A Masud Holub, FNP  03/29/2019 12:03 PM    [facility]

## 2019-05-26 NOTE — Progress Notes (Deleted)
 Patient ID: Charles Rangel, male    DOB: 12/31/1975, 43 y.o.   MRN: [id number]  HPI  Charles Rangel is a 43 y/o male with a history of Dm, HTN, COPD, OSA, DVT, gout, obesity, previous tobacco use and chronic heart failure.   Echo report from 11/17/2018 reviewed and showed an EF of 60-65%.  Has not been admitted or been in the ED in the last 6 months.   He presents today for a follow-up visit with a chief complaint of   Past Medical History:  Diagnosis Date  . (HFpEF) heart failure with preserved ejection fraction (HCC)    a. 02/2018 Echo: EF 65-70%, mildly dil LA.  . CHF (congestive heart failure) (HCC)   . COPD (chronic obstructive pulmonary disease) (HCC)   . Diabetes mellitus without complication (HCC)   . DVT (deep venous thrombosis) (HCC)   . Gout   . Hypertension   . Morbid obesity (HCC)   . OSA (obstructive sleep apnea)    Past Surgical History:  Procedure Laterality Date  . Left leg surgery for fracture     Family History  Problem Relation Age of Onset  . Hypertension Mother   . Hypertension Father   . CVA Father 45   Social History   Tobacco Use  . Smoking status: Former Smoker    Packs/day: 1.00    Years: 20.00    Pack years: 20.00    Quit date: 10/20/2010    Years since quitting: 8.6  . Smokeless tobacco: Never Used  Substance Use Topics  . Alcohol use: Yes    Frequency: Never    Comment: rarely, not in 1.5 years   Allergies  Allergen Reactions  . Tylenol [Acetaminophen] Anaphylaxis  . Dog Epithelium Allergy Skin Test Hives  . Apple Hives  . Other Hives    RAISINS     Review of Systems  Constitutional: Negative for appetite change and fatigue.  HENT: Negative for congestion, rhinorrhea and sore throat.   Eyes: Negative.   Respiratory: Positive for shortness of breath (with moderate exertion). Negative for cough and chest tightness.   Cardiovascular: Positive for palpitations (when climbing stairs) and leg swelling (left ankle). Negative for chest  pain.  Gastrointestinal: Negative for abdominal distention and abdominal pain.  Endocrine: Negative.   Genitourinary: Negative.   Musculoskeletal: Positive for arthralgias (left foot).  Skin: Negative.   Allergic/Immunologic: Negative.   Neurological: Positive for headaches (today). Negative for dizziness and light-headedness.  Hematological: Negative for adenopathy. Does not bruise/bleed easily.  Psychiatric/Behavioral: Negative for dysphoric mood and sleep disturbance (sleeping well). The patient is not nervous/anxious.       Physical Exam Vitals signs and nursing note reviewed.  Constitutional:      Appearance: Normal appearance.  HENT:     Head: Normocephalic and atraumatic.  Neck:     Musculoskeletal: Normal range of motion and neck supple.  Cardiovascular:     Rate and Rhythm: Normal rate and regular rhythm.  Pulmonary:     Effort: Pulmonary effort is normal. No respiratory distress.     Breath sounds: No wheezing or rales.  Abdominal:     General: There is no distension.     Palpations: Abdomen is soft.  Musculoskeletal:        General: Tenderness (around left ankle) present.     Right lower leg: No edema.     Left lower leg: No edema.  Skin:    General: Skin is warm and dry.  Neurological:       General: No focal deficit present.     Mental Status: He is alert and oriented to person, place, and time.  Psychiatric:        Mood and Affect: Mood normal.        Behavior: Behavior normal.    Assessment & Plan:  1: Chronic heart failure with preserved ejection fraction- - NYHA class II - euvolemic today - weighing daily and he was reminded to call for an overnight weight gain of >2 pounds or a weekly weight gain of >5 pounds - 315.2 from last visit here 6 months ago - not adding salt but hasn't been reading food labels much. Reviewed the importance of reading food labels and how to read food labels so that he can keep his daily sodium intake to 2000mg  daily.  -  sees cardiology at the [facility] but has a NP appointment with Christell Faith 12/09/2018 - BNP 11/14/2018 was 43.0 - advised him to see what company's name is on his CPAP so that he can call them and have them come service his equipment  2: HTN- - BP  - sees PCP at the [facility] - BMP 11/17/2018 reviewed and showed sodium 137, potassium 4.0, creatinine 1.26 and GFR >60  3: DM- - A1c 11/15/2018 was 5.3% - glucose has been running   Patient did not bring his medications nor a list. Each medication was verbally reviewed with the patient and he was encouraged to bring the bottles to every visit to confirm accuracy of list.

## 2019-05-30 ENCOUNTER — Ambulatory Visit: Payer: PRIVATE HEALTH INSURANCE | Admitting: Family

## 2019-06-06 NOTE — Progress Notes (Deleted)
Patient ID: Charles Rangel, male    DOB: 1976/04/02, 43 y.o.   MRN: [id number]  HPI  Charles Rangel is a 43 y/o male with a history of Dm, HTN, COPD, OSA, DVT, gout, obesity, previous tobacco use and chronic heart failure.   Echo report from 11/17/2018 reviewed and showed an EF of 60-65%.  Has not been admitted or been in the ED in the last 6 months.   Charles Rangel presents today for a follow-up visit with a chief complaint of   Past Medical History:  Diagnosis Date  . (HFpEF) heart failure with preserved ejection fraction (HCC)    a. 02/2018 Echo: EF 65-70%, mildly dil LA.  . CHF (congestive heart failure) (HCC)   . COPD (chronic obstructive pulmonary disease) (HCC)   . Diabetes mellitus without complication (HCC)   . DVT (deep venous thrombosis) (HCC)   . Gout   . Hypertension   . Morbid obesity (HCC)   . OSA (obstructive sleep apnea)    Past Surgical History:  Procedure Laterality Date  . Left leg surgery for fracture     Family History  Problem Relation Age of Onset  . Hypertension Mother   . Hypertension Father   . CVA Father [id number]   Social History   Tobacco Use  . Smoking status: Former Smoker    Packs/day: 1.00    Years: 20.00    Pack years: 20.00    Quit date: 10/20/2010    Years since quitting: 8.6  . Smokeless tobacco: Never Used  Substance Use Topics  . Alcohol use: Yes    Frequency: Never    Comment: rarely, not in 1.5 years   Allergies  Allergen Reactions  . Tylenol [Acetaminophen] Anaphylaxis  . Dog Epithelium Allergy Skin Test Hives  . Apple Hives  . Other Hives    RAISINS     Review of Systems  Constitutional: Negative for appetite change and fatigue.  HENT: Negative for congestion, rhinorrhea and sore throat.   Eyes: Negative.   Respiratory: Positive for shortness of breath (with moderate exertion). Negative for cough and chest tightness.   Cardiovascular: Positive for palpitations (when climbing stairs) and leg swelling (left ankle). Negative for chest  pain.  Gastrointestinal: Negative for abdominal distention and abdominal pain.  Endocrine: Negative.   Genitourinary: Negative.   Musculoskeletal: Positive for arthralgias (left foot).  Skin: Negative.   Allergic/Immunologic: Negative.   Neurological: Positive for headaches (today). Negative for dizziness and light-headedness.  Hematological: Negative for adenopathy. Does not bruise/bleed easily.  Psychiatric/Behavioral: Negative for dysphoric mood and sleep disturbance (sleeping well). The patient is not nervous/anxious.       Physical Exam Vitals signs and nursing note reviewed.  Constitutional:      Appearance: Normal appearance.  HENT:     Head: Normocephalic and atraumatic.  Neck:     Musculoskeletal: Normal range of motion and neck supple.  Cardiovascular:     Rate and Rhythm: Normal rate and regular rhythm.  Pulmonary:     Effort: Pulmonary effort is normal. No respiratory distress.     Breath sounds: No wheezing or rales.  Abdominal:     General: There is no distension.     Palpations: Abdomen is soft.  Musculoskeletal:        General: Tenderness (around left ankle) present.     Right lower leg: No edema.     Left lower leg: No edema.  Skin:    General: Skin is warm and dry.  Neurological:  General: No focal deficit present.     Mental Status: Charles Rangel is alert and oriented to person, place, and time.  Psychiatric:        Mood and Affect: Mood normal.        Behavior: Behavior normal.    Assessment & Plan:  1: Chronic heart failure with preserved ejection fraction- - NYHA class II - euvolemic today - weighing daily and Charles Rangel was reminded to call for an overnight weight gain of >2 pounds or a weekly weight gain of >5 pounds - 315.2 from last visit here 6 months ago - not adding salt but hasn't been reading food labels much. Reviewed the importance of reading food labels and how to read food labels so that Charles Rangel can keep his daily sodium intake to 2000mg  daily.  -  sees cardiology at the [facility] but has a NP appointment with Christell Faith 12/09/2018 - BNP 11/14/2018 was 43.0 - advised him to see what company's name is on his CPAP so that Charles Rangel can call them and have them come service his equipment  2: HTN- - BP  - sees PCP at the [facility] - BMP 11/17/2018 reviewed and showed sodium 137, potassium 4.0, creatinine 1.26 and GFR >60  3: DM- - A1c 11/15/2018 was 5.3% - glucose has been running   Patient did not bring his medications nor a list. Each medication was verbally reviewed with the patient and Charles Rangel was encouraged to bring the bottles to every visit to confirm accuracy of list.

## 2019-06-07 ENCOUNTER — Ambulatory Visit: Payer: PRIVATE HEALTH INSURANCE | Admitting: Family

## 2019-06-07 ENCOUNTER — Telehealth: Payer: Self-pay | Admitting: Family

## 2019-06-07 NOTE — Telephone Encounter (Signed)
Patient did not show for his [facility] appointment on 06/07/2019. Will attempt to reschedule.

## 2019-06-17 ENCOUNTER — Other Ambulatory Visit: Payer: Self-pay

## 2019-06-17 ENCOUNTER — Emergency Department
Admission: EM | Admit: 2019-06-17 | Discharge: 2019-06-17 | Disposition: A | Discharge status: Home / Self Care | Payer: PRIVATE HEALTH INSURANCE | Attending: Emergency Medicine | Admitting: Emergency Medicine

## 2019-06-17 ENCOUNTER — Encounter: Payer: Self-pay | Admitting: Emergency Medicine

## 2019-06-17 DIAGNOSIS — Z7982 Long term (current) use of aspirin: Secondary | ICD-10-CM | POA: Insufficient documentation

## 2019-06-17 DIAGNOSIS — Z87891 Personal history of nicotine dependence: Secondary | ICD-10-CM | POA: Insufficient documentation

## 2019-06-17 DIAGNOSIS — I110 Hypertensive heart disease with heart failure: Secondary | ICD-10-CM | POA: Diagnosis not present

## 2019-06-17 DIAGNOSIS — Z79899 Other long term (current) drug therapy: Secondary | ICD-10-CM | POA: Diagnosis not present

## 2019-06-17 DIAGNOSIS — M25571 Pain in right ankle and joints of right foot: Secondary | ICD-10-CM | POA: Diagnosis present

## 2019-06-17 DIAGNOSIS — M109 Gout, unspecified: Secondary | ICD-10-CM | POA: Insufficient documentation

## 2019-06-17 DIAGNOSIS — Z7984 Long term (current) use of oral hypoglycemic drugs: Secondary | ICD-10-CM | POA: Diagnosis not present

## 2019-06-17 DIAGNOSIS — E119 Type 2 diabetes mellitus without complications: Secondary | ICD-10-CM | POA: Diagnosis not present

## 2019-06-17 DIAGNOSIS — I5032 Chronic diastolic (congestive) heart failure: Secondary | ICD-10-CM | POA: Diagnosis not present

## 2019-06-17 MED ORDER — COLCHICINE 0.6 MG PO TABS
1.8000 mg | ORAL_TABLET | Freq: Once | ORAL | Status: AC
  Administered 2019-06-17: 1.8 mg via ORAL
  Filled 2019-06-17: qty 3

## 2019-06-17 MED ORDER — PREDNISONE 20 MG PO TABS
60.0000 mg | ORAL_TABLET | Freq: Once | ORAL | Status: AC
  Administered 2019-06-17: 23:00:00 60 mg via ORAL
  Filled 2019-06-17: qty 3

## 2019-06-17 MED ORDER — PREDNISONE 50 MG PO TABS: 50.0000 mg | ORAL_TABLET | Freq: Every day | ORAL | 0 refills | Status: DC

## 2019-06-17 MED ORDER — COLCHICINE 0.6 MG PO TABS: 0.6000 mg | ORAL_TABLET | Freq: Every day | ORAL | 0 refills | Status: DC

## 2019-06-17 NOTE — ED Provider Notes (Signed)
[facility] Emergency Department Provider Note  ____________________________________________  Time seen: Approximately 10:47 PM  I have reviewed the triage vital signs and the nursing notes.   HISTORY  Chief Complaint Foot Pain    HPI Charles Rangel is a 43 y.o. male who presents the emergency department complaining of right foot and ankle pain.  Patient has history of gout, developed a gout flare 11 days ago.  Patient reports that he has had swelling, erythema, warmth to the joint.  Patient takes allopurinol every day and he doubled his dose which is helped somewhat.  Patient is concerned as symptoms have not fully alleviated at this time.  Patient has taken other medications to include NSAIDs, steroids, colchicine in the past which was effective in treating his gout.  No other complaints at this time.  Other than doubling his allopurinol, no other medications prior to arrival.         Past Medical History:  Diagnosis Date  . (HFpEF) heart failure with preserved ejection fraction ([place])    a. 02/2018 Echo: EF 65-70%, mildly dil LA.  . CHF (congestive heart failure) ([place])   . COPD (chronic obstructive pulmonary disease) ([place])   . Diabetes mellitus without complication ([place])   . DVT (deep venous thrombosis) ([place])   . Gout   . Hypertension   . Morbid obesity ([place])   . OSA (obstructive sleep apnea)     Patient Active Problem List   Diagnosis Date Noted  . HTN (hypertension) 11/23/2018  . DM (diabetes mellitus) ([place]) 11/23/2018  . Gout 11/16/2018  . CHF (congestive heart failure) ([place]) 11/14/2018  . Acute on chronic diastolic CHF (congestive heart failure) ([place]) 06/28/2018    Past Surgical History:  Procedure Laterality Date  . Left leg surgery for fracture      Prior to Admission medications   Medication Sig Start Date End Date Taking? Authorizing Provider  allopurinol (ZYLOPRIM) 100 MG tablet Take 1 tablet (100 mg total) by mouth daily. 11/18/18    Ripley Fraise, PA  aspirin EC 81 MG tablet Take 81 mg daily by mouth.    [provider]  bumetanide (BUMEX) 2 MG tablet Take 1 tablet (2 mg total) by mouth 2 (two) times daily for 30 days. 11/17/18 03/29/19  Ripley Fraise, PA  Cholecalciferol (VITAMIN D3) 1000 units CAPS Take 4 capsules by mouth daily.     [provider]  colchicine 0.6 MG tablet Take 1 tablet (0.6 mg total) by mouth daily. Take 1 tab daily for at least 6 more days. If  Symptoms persist past 6 days continue to use until prescription is finished 06/17/19   Verne Lanuza, Roderic Palau D, PA-C  lisinopril (PRINIVIL,ZESTRIL) 40 MG tablet Take 1 tablet (40 mg total) by mouth daily for 30 doses. 11/18/18 03/29/19  Ripley Fraise, PA  metFORMIN (GLUCOPHAGE) 500 MG tablet Take 1,000 mg by mouth 2 (two) times daily with a meal.     [provider]  metoprolol succinate (TOPROL-XL) 100 MG 24 hr tablet Take 100 mg by mouth daily. Take with or immediately following a meal.    [provider]  predniSONE (DELTASONE) 50 MG tablet Take 1 tablet (50 mg total) by mouth daily with breakfast. 06/17/19   Keleigh Kazee, Charline Bills, PA-C    Allergies Tylenol [acetaminophen], Dog epithelium allergy skin test, Apple, and Other  Family History  Problem Relation Age of Onset  . Hypertension Mother   . Hypertension Father   . CVA Father 28  Social History Social History   Tobacco Use  . Smoking status: Former Smoker    Packs/day: 1.00    Years: 20.00    Pack years: 20.00    Quit date: 10/20/2010    Years since quitting: 8.6  . Smokeless tobacco: Never Used  Substance Use Topics  . Alcohol use: Not Currently    Frequency: Never  . Drug use: No     Review of Systems  Constitutional: No fever/chills Eyes: No visual changes. No discharge ENT: No upper respiratory complaints. Cardiovascular: no chest pain. Respiratory: no cough. No SOB. Gastrointestinal: No abdominal pain.  No nausea, no vomiting.   Musculoskeletal:  Positive for pain, gout flare to the right ankle Skin: Negative for rash, abrasions, lacerations, ecchymosis. Neurological: Negative for headaches, focal weakness or numbness. 10-point ROS otherwise negative.  ____________________________________________   PHYSICAL EXAM:  VITAL SIGNS: ED Triage Vitals  Enc Vitals Group     BP 06/17/19 2024 (!) 155/97     Pulse Rate 06/17/19 2024 94     Resp 06/17/19 2024 18     Temp 06/17/19 2024 98.9 F (37.2 C)     Temp Source 06/17/19 2024 Oral     SpO2 06/17/19 2024 97 %     Weight 06/17/19 2025 278 lb (126.1 kg)     Height 06/17/19 2025 5\' 6"  (1.676 m)     Head Circumference --      Peak Flow --      Pain Score 06/17/19 2024 9     Pain Loc --      Pain Edu? --      Excl. in GC? --      Constitutional: Alert and oriented. Well appearing and in no acute distress. Eyes: Conjunctivae are normal. PERRL. EOMI. Head: Atraumatic. ENT:      Ears:       Nose: No congestion/rhinnorhea.      Mouth/Throat: Mucous membranes are moist.  Neck: No stridor.    Cardiovascular: Normal rate, regular rhythm. Normal S1 and S2.  Good peripheral circulation. Respiratory: Normal respiratory effort without tachypnea or retractions. Lungs CTAB. Good air entry to the bases with no decreased or absent breath sounds. Musculoskeletal: Full range of motion to all extremities. No gross deformities appreciated.  Visualization of the right ankle reveals mild edema but no erythema or overlying skin changes to the right ankle.  Patient is able to extend, flex ankle appropriately.  Patient is tender along the anterior joint line as well as the posterior aspect of the ankle joint.  No appreciable effusion on palpation.  No palpable abnormality.  Dorsalis pedis pulse intact.  Sensation intact all digits. Neurologic:  Normal speech and language. No gross focal neurologic deficits are appreciated.  Skin:  Skin is warm, dry and intact. No rash noted. Psychiatric: Mood and  affect are normal. Speech and behavior are normal. Patient exhibits appropriate insight and judgement.   ____________________________________________   LABS (all labs ordered are listed, but only abnormal results are displayed)  Labs Reviewed - No data to display ____________________________________________  EKG   ____________________________________________  RADIOLOGY   No results found.  ____________________________________________    PROCEDURES  Procedure(s) performed:    Procedures    Medications  colchicine tablet 1.8 mg (has no administration in time range)  predniSONE (DELTASONE) tablet 60 mg (has no administration in time range)     ____________________________________________   INITIAL IMPRESSION / ASSESSMENT AND PLAN / ED COURSE  Pertinent labs & imaging results that  were available during my care of the patient were reviewed by me and considered in my medical decision making (see chart for details).  Review of the [place] CSRS was performed in accordance of the [facility] prior to dispensing any controlled drugs.           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  Patient presented to the emergency department complaining of gout to the right ankle.  Patient had been doubling his allopurinol but does not take any other medications for his gout.  While allopurinol did improve symptoms somewhat, I feel that patient needs further medication with colchicine and prednisone.  Follow-up primary care as needed.. Patient is given ED precautions to return to the ED for any worsening or new symptoms.     ____________________________________________  FINAL CLINICAL IMPRESSION(S) / ED DIAGNOSES  Final diagnoses:  Acute gout of right ankle, unspecified cause      NEW MEDICATIONS STARTED DURING THIS VISIT:  ED Discharge Orders         Ordered    colchicine 0.6 MG tablet  Daily     06/17/19 2249    predniSONE (DELTASONE) 50 MG  tablet  Daily with breakfast     06/17/19 2249              This chart was dictated using voice recognition software/Dragon. Despite best efforts to proofread, errors can occur which can change the meaning. Any change was purely unintentional.    Racheal PatchesCuthriell, Ailyn Gladd D, PA-C 06/17/19 2251    Emily FilbertWilliams, Cortez Steelman E, MD 06/17/19 2258

## 2019-06-17 NOTE — ED Triage Notes (Signed)
Patient with complaint of gout pain in his right foot times one week with no improvement.

## 2019-06-17 NOTE — ED Notes (Signed)
Pt has pain to R foot; able to move foot; swollen; taking allopurinol for gout. Roderic Palau PA at bedside.

## 2019-08-27 ENCOUNTER — Other Ambulatory Visit: Payer: Self-pay

## 2019-08-27 ENCOUNTER — Emergency Department
Admission: EM | Admit: 2019-08-27 | Discharge: 2019-08-28 | Disposition: A | Discharge status: Home / Self Care | Payer: PRIVATE HEALTH INSURANCE | Attending: Emergency Medicine | Admitting: Emergency Medicine

## 2019-08-27 ENCOUNTER — Encounter: Payer: Self-pay | Admitting: Radiology

## 2019-08-27 ENCOUNTER — Emergency Department: Payer: PRIVATE HEALTH INSURANCE

## 2019-08-27 DIAGNOSIS — R0781 Pleurodynia: Secondary | ICD-10-CM | POA: Insufficient documentation

## 2019-08-27 DIAGNOSIS — Z20828 Contact with and (suspected) exposure to other viral communicable diseases: Secondary | ICD-10-CM | POA: Diagnosis not present

## 2019-08-27 DIAGNOSIS — R0602 Shortness of breath: Secondary | ICD-10-CM | POA: Insufficient documentation

## 2019-08-27 DIAGNOSIS — E119 Type 2 diabetes mellitus without complications: Secondary | ICD-10-CM | POA: Diagnosis not present

## 2019-08-27 DIAGNOSIS — Z7982 Long term (current) use of aspirin: Secondary | ICD-10-CM | POA: Insufficient documentation

## 2019-08-27 DIAGNOSIS — I5032 Chronic diastolic (congestive) heart failure: Secondary | ICD-10-CM | POA: Diagnosis not present

## 2019-08-27 DIAGNOSIS — I110 Hypertensive heart disease with heart failure: Secondary | ICD-10-CM | POA: Diagnosis not present

## 2019-08-27 DIAGNOSIS — Z87891 Personal history of nicotine dependence: Secondary | ICD-10-CM | POA: Diagnosis not present

## 2019-08-27 DIAGNOSIS — R2243 Localized swelling, mass and lump, lower limb, bilateral: Secondary | ICD-10-CM | POA: Insufficient documentation

## 2019-08-27 DIAGNOSIS — J449 Chronic obstructive pulmonary disease, unspecified: Secondary | ICD-10-CM | POA: Diagnosis not present

## 2019-08-27 DIAGNOSIS — Z20822 Contact with and (suspected) exposure to covid-19: Secondary | ICD-10-CM

## 2019-08-27 DIAGNOSIS — Z7984 Long term (current) use of oral hypoglycemic drugs: Secondary | ICD-10-CM | POA: Insufficient documentation

## 2019-08-27 DIAGNOSIS — Z79899 Other long term (current) drug therapy: Secondary | ICD-10-CM | POA: Insufficient documentation

## 2019-08-27 DIAGNOSIS — R079 Chest pain, unspecified: Secondary | ICD-10-CM | POA: Diagnosis present

## 2019-08-27 LAB — CBC
HCT: 41.6 % (ref 39.0–52.0)
Hemoglobin: 13.3 g/dL (ref 13.0–17.0)
MCH: 25.4 pg — ABNORMAL LOW (ref 26.0–34.0)
MCHC: 32 g/dL (ref 30.0–36.0)
MCV: 79.4 fL — ABNORMAL LOW (ref 80.0–100.0)
Platelets: 250 10*3/uL (ref 150–400)
RBC: 5.24 MIL/uL (ref 4.22–5.81)
RDW: 17.6 % — ABNORMAL HIGH (ref 11.5–15.5)
WBC: 7.1 10*3/uL (ref 4.0–10.5)
nRBC: 0 % (ref 0.0–0.2)

## 2019-08-27 LAB — BASIC METABOLIC PANEL
Anion gap: 13 (ref 5–15)
BUN: 21 mg/dL — ABNORMAL HIGH (ref 6–20)
CO2: 31 mmol/L (ref 22–32)
Calcium: 9.8 mg/dL (ref 8.9–10.3)
Chloride: 94 mmol/L — ABNORMAL LOW (ref 98–111)
Creatinine, Ser: 1.56 mg/dL — ABNORMAL HIGH (ref 0.61–1.24)
GFR calc Af Amer: >60 mL/min (ref >60)
GFR calc non Af Amer: 53 mL/min — ABNORMAL LOW (ref >60)
Glucose, Bld: 164 mg/dL — ABNORMAL HIGH (ref 70–99)
Potassium: 3.3 mmol/L — ABNORMAL LOW (ref 3.5–5.1)
Sodium: 138 mmol/L (ref 135–145)

## 2019-08-27 LAB — TROPONIN I (HIGH SENSITIVITY)
Troponin I (High Sensitivity): 5 ng/L (ref <18)
Troponin I (High Sensitivity): 6 ng/L (ref <18)

## 2019-08-27 MED ORDER — IOHEXOL 350 MG/ML SOLN
75.0000 mL | Freq: Once | INTRAVENOUS | Status: AC | PRN
  Administered 2019-08-27: 100 mL via INTRAVENOUS

## 2019-08-27 MED ORDER — OXYCODONE HCL 5 MG PO TABS
5.0000 mg | ORAL_TABLET | Freq: Once | ORAL | Status: AC
  Administered 2019-08-27: 5 mg via ORAL
  Filled 2019-08-27: qty 1

## 2019-08-27 MED ORDER — SODIUM CHLORIDE 0.9% FLUSH: 3.0000 mL | Freq: Once | INTRAVENOUS | Status: DC

## 2019-08-27 MED ORDER — ALBUTEROL SULFATE HFA 108 (90 BASE) MCG/ACT IN AERS: 2.0000 | INHALATION_SPRAY | Freq: Four times a day (QID) | RESPIRATORY_TRACT | 0 refills | Status: DC | PRN

## 2019-08-27 NOTE — ED Triage Notes (Signed)
Patient reports symptoms began Wednesday.  Reports thought it was a "gas bubble" now not sure.  Patient reports having pain that starts mid upper chest and radiates around into his back.  Reports pain is constant and deep breathing makes it worse.

## 2019-08-27 NOTE — ED Notes (Signed)
This RN called to CT to start an IV as other IV became dislodged while moving.

## 2019-08-27 NOTE — ED Provider Notes (Signed)
[facility] Emergency Department Provider Note   ____________________________________________   First MD Initiated Contact with Patient 08/27/19 2128     (approximate)  I have reviewed the triage vital signs and the nursing notes.   HISTORY  Chief Complaint Chest Pain    HPI Langston Tuberville is a 43 y.o. male with past medical history of DVT/PE, hypertension, COPD, and diastolic CHF who presents to the ED complaining of chest pain.  Patient reports 3 days of central and left-sided chest pain which she describes as sharp.  Pain is constant and exacerbated when he goes to take a deep breath.  It seems to radiate into the middle of his back.  He denies any associated fevers or cough, but does state that the pain makes it difficult for him to take a deep breath and causes him to feel short of breath.  He deals with chronic swelling in his lower extremities, but feels that his right lower extremity is slightly more swollen than usual.  He has not had any pain in his right lower extremity.  He is not aware of any sick contacts or COVID-19 exposures.        Past Medical History:  Diagnosis Date  . (HFpEF) heart failure with preserved ejection fraction ([place])    a. 02/2018 Echo: EF 65-70%, mildly dil LA.  . CHF (congestive heart failure) ([place])   . COPD (chronic obstructive pulmonary disease) ([place])   . Diabetes mellitus without complication ([place])   . DVT (deep venous thrombosis) ([place])   . Gout   . Hypertension   . Morbid obesity ([place])   . OSA (obstructive sleep apnea)     Patient Active Problem List   Diagnosis Date Noted  . HTN (hypertension) 11/23/2018  . DM (diabetes mellitus) ([place]) 11/23/2018  . Gout 11/16/2018  . CHF (congestive heart failure) ([place]) 11/14/2018  . Acute on chronic diastolic CHF (congestive heart failure) ([place]) 06/28/2018    Past Surgical History:  Procedure Laterality Date  . Left leg surgery for fracture      Prior to Admission  medications   Medication Sig Start Date End Date Taking? Authorizing Provider  albuterol (VENTOLIN HFA) 108 (90 Base) MCG/ACT inhaler Inhale 2 puffs into the lungs every 6 (six) hours as needed for wheezing or shortness of breath. 08/27/19   Blake Divine, MD  allopurinol (ZYLOPRIM) 100 MG tablet Take 1 tablet (100 mg total) by mouth daily. 11/18/18   Ripley Fraise, PA  aspirin EC 81 MG tablet Take 81 mg daily by mouth.    [provider]  bumetanide (BUMEX) 2 MG tablet Take 1 tablet (2 mg total) by mouth 2 (two) times daily for 30 days. 11/17/18 03/29/19  Ripley Fraise, PA  Cholecalciferol (VITAMIN D3) 1000 units CAPS Take 4 capsules by mouth daily.     [provider]  colchicine 0.6 MG tablet Take 1 tablet (0.6 mg total) by mouth daily. Take 1 tab daily for at least 6 more days. If  Symptoms persist past 6 days continue to use until prescription is finished 06/17/19   Cuthriell, Roderic Palau D, PA-C  lisinopril (PRINIVIL,ZESTRIL) 40 MG tablet Take 1 tablet (40 mg total) by mouth daily for 30 doses. 11/18/18 03/29/19  Ripley Fraise, PA  metFORMIN (GLUCOPHAGE) 500 MG tablet Take 1,000 mg by mouth 2 (two) times daily with a meal.     [provider]  metoprolol succinate (TOPROL-XL) 100 MG 24 hr tablet Take 100 mg by mouth daily.  Take with or immediately following a meal.    [provider]  predniSONE (DELTASONE) 50 MG tablet Take 1 tablet (50 mg total) by mouth daily with breakfast. 06/17/19   Cuthriell, Delorise RoyalsJonathan D, PA-C    Allergies Tylenol [acetaminophen], Dog epithelium allergy skin test, Apple, and Other  Family History  Problem Relation Age of Onset  . Hypertension Mother   . Hypertension Father   . CVA Father 3045    Social History Social History   Tobacco Use  . Smoking status: Former Smoker    Packs/day: 1.00    Years: 20.00    Pack years: 20.00    Quit date: 10/20/2010    Years since quitting: 8.8  . Smokeless tobacco: Never Used  Substance Use Topics  .  Alcohol use: Not Currently    Frequency: Never  . Drug use: No    Review of Systems  Constitutional: No fever/chills Eyes: No visual changes. ENT: No sore throat. Cardiovascular: Positive for chest pain. Respiratory: Positive for shortness of breath. Gastrointestinal: No abdominal pain.  No nausea, no vomiting.  No diarrhea.  No constipation. Genitourinary: Negative for dysuria. Musculoskeletal: Negative for back pain. Skin: Negative for rash. Neurological: Negative for headaches, focal weakness or numbness.  ____________________________________________   PHYSICAL EXAM:  VITAL SIGNS: ED Triage Vitals  Enc Vitals Group     BP 08/27/19 1929 (!) 153/91     Pulse Rate 08/27/19 1929 97     Resp 08/27/19 1929 18     Temp 08/27/19 1929 99.3 F (37.4 C)     Temp Source 08/27/19 1929 Oral     SpO2 08/27/19 1929 97 %     Weight 08/27/19 1926 300 lb (136.1 kg)     Height 08/27/19 1926 5\' 6"  (1.676 m)     Head Circumference --      Peak Flow --      Pain Score 08/27/19 1926 10     Pain Loc --      Pain Edu? --      Excl. in GC? --     Constitutional: Alert and oriented. Eyes: Conjunctivae are normal. Head: Atraumatic. Nose: No congestion/rhinnorhea. Mouth/Throat: Mucous membranes are moist. Neck: Normal ROM Cardiovascular: Normal rate, regular rhythm. Grossly normal heart sounds. Respiratory: Normal respiratory effort.  No retractions. Lungs CTAB. Gastrointestinal: Soft and nontender. No distention. Genitourinary: deferred Musculoskeletal: 1+ pitting edema to knees bilaterally, no calf tenderness bilaterally. Neurologic:  Normal speech and language. No gross focal neurologic deficits are appreciated. Skin:  Skin is warm, dry and intact. No rash noted. Psychiatric: Mood and affect are normal. Speech and behavior are normal.  ____________________________________________   LABS (all labs ordered are listed, but only abnormal results are displayed)  Labs Reviewed   BASIC METABOLIC PANEL - Abnormal; Notable for the following components:      Result Value   Potassium 3.3 (*)    Chloride 94 (*)    Glucose, Bld 164 (*)    BUN 21 (*)    Creatinine, Ser 1.56 (*)    GFR calc non Af Amer 53 (*)    All other components within normal limits  CBC - Abnormal; Notable for the following components:   MCV 79.4 (*)    MCH 25.4 (*)    RDW 17.6 (*)    All other components within normal limits  SARS CORONAVIRUS 2 (TAT 6-24 HRS)  TROPONIN I (HIGH SENSITIVITY)  TROPONIN I (HIGH SENSITIVITY)   ____________________________________________  EKG  ED ECG  REPORT I, Chesley Noon, the attending physician, personally viewed and interpreted this ECG.   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  His EKG shows no acute ischemic changes and initial troponin is negative, symptoms atypical for ACS.  Presentation is more concerning for PE given his history and pleuritic pain.  Will obtain CTA to assess for PE.  Chest x-ray also shows scattered opacities which could represent pulmonary edema versus atypical pneumonia.  Patient is not aware of any COVID-19 contacts, will need to test for COVID-19 although is not in any respiratory distress and not requiring supplemental oxygen.  CHF exacerbation is also possible.  CTA is negative for PE, does show mosaic attenuation in his lungs.  This does not appear consistent with pulmonary edema or CHF exacerbation.  Would suspect COVID-19 however  patient has had similar appearance on imaging in the past.  Counseled him to follow-up with his PCP, who may refer him to pulmonology as needed.  Counseled patient on quarantining until his COVID-19 testing results are known.  Also counseled patient to return to the ED for new or worsening symptoms, patient agrees with plan.      ____________________________________________   FINAL CLINICAL IMPRESSION(S) / ED DIAGNOSES  Final diagnoses:  Pleuritic chest pain  Suspected COVID-19 virus infection     ED Discharge Orders         Ordered    albuterol (VENTOLIN HFA) 108 (90 Base) MCG/ACT inhaler  Every 6 hours PRN     08/27/19 2323           Note:  This document was prepared using Dragon voice recognition software and may include unintentional dictation errors.   Chesley Noon, MD 08/27/19 [phone]

## 2019-08-28 LAB — SARS CORONAVIRUS 2 (TAT 6-24 HRS): SARS Coronavirus 2: NEGATIVE

## 2019-12-22 ENCOUNTER — Inpatient Hospital Stay (HOSPITAL_COMMUNITY)
Admit: 2019-12-22 | Discharge: 2019-12-22 | Disposition: A | Discharge status: Home / Self Care | Payer: PRIVATE HEALTH INSURANCE | Attending: Internal Medicine | Admitting: Internal Medicine

## 2019-12-22 ENCOUNTER — Emergency Department: Payer: PRIVATE HEALTH INSURANCE

## 2019-12-22 ENCOUNTER — Encounter: Payer: Self-pay | Admitting: Intensive Care

## 2019-12-22 ENCOUNTER — Other Ambulatory Visit: Payer: Self-pay

## 2019-12-22 ENCOUNTER — Inpatient Hospital Stay
Admission: EM | Admit: 2019-12-22 | Discharge: 2019-12-25 | DRG: 292 | Disposition: A | Discharge status: Home / Self Care | Payer: PRIVATE HEALTH INSURANCE | Attending: Hospitalist | Admitting: Hospitalist

## 2019-12-22 DIAGNOSIS — Z6841 Body Mass Index (BMI) 40.0 and over, adult: Secondary | ICD-10-CM

## 2019-12-22 DIAGNOSIS — Z91018 Allergy to other foods: Secondary | ICD-10-CM | POA: Diagnosis not present

## 2019-12-22 DIAGNOSIS — I5033 Acute on chronic diastolic (congestive) heart failure: Secondary | ICD-10-CM | POA: Diagnosis present

## 2019-12-22 DIAGNOSIS — Z79899 Other long term (current) drug therapy: Secondary | ICD-10-CM | POA: Diagnosis not present

## 2019-12-22 DIAGNOSIS — Z823 Family history of stroke: Secondary | ICD-10-CM

## 2019-12-22 DIAGNOSIS — Z9119 Patient's noncompliance with other medical treatment and regimen: Secondary | ICD-10-CM

## 2019-12-22 DIAGNOSIS — Z20822 Contact with and (suspected) exposure to covid-19: Secondary | ICD-10-CM | POA: Diagnosis present

## 2019-12-22 DIAGNOSIS — Z886 Allergy status to analgesic agent status: Secondary | ICD-10-CM

## 2019-12-22 DIAGNOSIS — Z7982 Long term (current) use of aspirin: Secondary | ICD-10-CM | POA: Diagnosis not present

## 2019-12-22 DIAGNOSIS — Z7984 Long term (current) use of oral hypoglycemic drugs: Secondary | ICD-10-CM

## 2019-12-22 DIAGNOSIS — R0602 Shortness of breath: Secondary | ICD-10-CM | POA: Diagnosis present

## 2019-12-22 DIAGNOSIS — Z8249 Family history of ischemic heart disease and other diseases of the circulatory system: Secondary | ICD-10-CM | POA: Diagnosis not present

## 2019-12-22 DIAGNOSIS — J449 Chronic obstructive pulmonary disease, unspecified: Secondary | ICD-10-CM | POA: Diagnosis present

## 2019-12-22 DIAGNOSIS — I5031 Acute diastolic (congestive) heart failure: Secondary | ICD-10-CM | POA: Diagnosis not present

## 2019-12-22 DIAGNOSIS — Z86718 Personal history of other venous thrombosis and embolism: Secondary | ICD-10-CM | POA: Diagnosis not present

## 2019-12-22 DIAGNOSIS — G4733 Obstructive sleep apnea (adult) (pediatric): Secondary | ICD-10-CM | POA: Diagnosis not present

## 2019-12-22 DIAGNOSIS — I10 Essential (primary) hypertension: Secondary | ICD-10-CM | POA: Diagnosis present

## 2019-12-22 DIAGNOSIS — Z87891 Personal history of nicotine dependence: Secondary | ICD-10-CM | POA: Diagnosis not present

## 2019-12-22 DIAGNOSIS — N179 Acute kidney failure, unspecified: Secondary | ICD-10-CM | POA: Diagnosis present

## 2019-12-22 DIAGNOSIS — E662 Morbid (severe) obesity with alveolar hypoventilation: Secondary | ICD-10-CM | POA: Diagnosis present

## 2019-12-22 DIAGNOSIS — M109 Gout, unspecified: Secondary | ICD-10-CM | POA: Diagnosis present

## 2019-12-22 DIAGNOSIS — I110 Hypertensive heart disease with heart failure: Secondary | ICD-10-CM | POA: Diagnosis not present

## 2019-12-22 DIAGNOSIS — R0600 Dyspnea, unspecified: Secondary | ICD-10-CM

## 2019-12-22 DIAGNOSIS — E119 Type 2 diabetes mellitus without complications: Secondary | ICD-10-CM | POA: Diagnosis present

## 2019-12-22 DIAGNOSIS — I2721 Secondary pulmonary arterial hypertension: Secondary | ICD-10-CM | POA: Diagnosis present

## 2019-12-22 LAB — CBC
HCT: 48.7 % (ref 39.0–52.0)
HCT: 49.1 % (ref 39.0–52.0)
Hemoglobin: 15.2 g/dL (ref 13.0–17.0)
Hemoglobin: 15.5 g/dL (ref 13.0–17.0)
MCH: 25.7 pg — ABNORMAL LOW (ref 26.0–34.0)
MCH: 26 pg (ref 26.0–34.0)
MCHC: 31.2 g/dL (ref 30.0–36.0)
MCHC: 31.6 g/dL (ref 30.0–36.0)
MCV: 82.4 fL (ref 80.0–100.0)
MCV: 82.2 fL (ref 80.0–100.0)
Platelets: 263 10*3/uL (ref 150–400)
Platelets: 263 10*3/uL (ref 150–400)
RBC: 5.91 MIL/uL — ABNORMAL HIGH (ref 4.22–5.81)
RBC: 5.97 MIL/uL — ABNORMAL HIGH (ref 4.22–5.81)
RDW: 18.2 % — ABNORMAL HIGH (ref 11.5–15.5)
RDW: 18.5 % — ABNORMAL HIGH (ref 11.5–15.5)
WBC: 6.2 10*3/uL (ref 4.0–10.5)
WBC: 6.4 10*3/uL (ref 4.0–10.5)
nRBC: 0 % (ref 0.0–0.2)
nRBC: 0 % (ref 0.0–0.2)

## 2019-12-22 LAB — COMPREHENSIVE METABOLIC PANEL
ALT: 27 U/L (ref 0–44)
AST: 23 U/L (ref 15–41)
Albumin: 4.2 g/dL (ref 3.5–5.0)
Alkaline Phosphatase: 94 U/L (ref 38–126)
Anion gap: 12 (ref 5–15)
BUN: 19 mg/dL (ref 6–20)
CO2: 35 mmol/L — ABNORMAL HIGH (ref 22–32)
Calcium: 10.1 mg/dL (ref 8.9–10.3)
Chloride: 97 mmol/L — ABNORMAL LOW (ref 98–111)
Creatinine, Ser: 1.38 mg/dL — ABNORMAL HIGH (ref 0.61–1.24)
GFR calc Af Amer: >60 mL/min (ref >60)
GFR calc non Af Amer: >60 mL/min (ref >60)
Glucose, Bld: 114 mg/dL — ABNORMAL HIGH (ref 70–99)
Potassium: 3.8 mmol/L (ref 3.5–5.1)
Sodium: 144 mmol/L (ref 135–145)
Total Bilirubin: 0.8 mg/dL (ref 0.3–1.2)
Total Protein: 7.8 g/dL (ref 6.5–8.1)

## 2019-12-22 LAB — POC SARS CORONAVIRUS 2 AG: SARS Coronavirus 2 Ag: NEGATIVE

## 2019-12-22 LAB — BRAIN NATRIURETIC PEPTIDE: B Natriuretic Peptide: 232 pg/mL — ABNORMAL HIGH (ref 0.0–100.0)

## 2019-12-22 LAB — ECHOCARDIOGRAM COMPLETE
Height: 66 in
Weight: 5088 oz

## 2019-12-22 LAB — GLUCOSE, CAPILLARY
Glucose-Capillary: 117 mg/dL — ABNORMAL HIGH (ref 70–99)
Glucose-Capillary: 145 mg/dL — ABNORMAL HIGH (ref 70–99)

## 2019-12-22 LAB — TROPONIN I (HIGH SENSITIVITY)
Troponin I (High Sensitivity): 8 ng/L (ref <18)
Troponin I (High Sensitivity): 6 ng/L (ref <18)
Troponin I (High Sensitivity): 5 ng/L (ref <18)

## 2019-12-22 LAB — HIV ANTIBODY (ROUTINE TESTING W REFLEX): HIV Screen 4th Generation wRfx: NONREACTIVE

## 2019-12-22 MED ORDER — ADULT MULTIVITAMIN W/MINERALS CH
1.0000 | ORAL_TABLET | Freq: Every day | ORAL | Status: DC
  Administered 2019-12-22 – 2019-12-25 (×4): 1 via ORAL
  Filled 2019-12-22 (×4): qty 1

## 2019-12-22 MED ORDER — ALLOPURINOL 100 MG PO TABS
100.0000 mg | ORAL_TABLET | Freq: Every day | ORAL | Status: DC
  Administered 2019-12-23 – 2019-12-25 (×3): 100 mg via ORAL
  Filled 2019-12-22 (×5): qty 1

## 2019-12-22 MED ORDER — ASPIRIN EC 81 MG PO TBEC
81.0000 mg | DELAYED_RELEASE_TABLET | Freq: Every day | ORAL | Status: DC
  Administered 2019-12-23 – 2019-12-25 (×3): 81 mg via ORAL
  Filled 2019-12-22 (×3): qty 1

## 2019-12-22 MED ORDER — IBUPROFEN 400 MG PO TABS
400.0000 mg | ORAL_TABLET | Freq: Once | ORAL | Status: AC
  Administered 2019-12-22: 12:00:00 400 mg via ORAL
  Filled 2019-12-22: qty 1

## 2019-12-22 MED ORDER — VITAMIN D3 25 MCG (1000 UNIT) PO TABS
4000.0000 [IU] | ORAL_TABLET | Freq: Every day | ORAL | Status: DC
  Administered 2019-12-22 – 2019-12-25 (×4): 4000 [IU] via ORAL
  Filled 2019-12-22 (×8): qty 4

## 2019-12-22 MED ORDER — COLCHICINE 0.6 MG PO TABS
0.6000 mg | ORAL_TABLET | Freq: Every day | ORAL | Status: DC
  Administered 2019-12-23 – 2019-12-24 (×2): 0.6 mg via ORAL
  Filled 2019-12-22 (×4): qty 1

## 2019-12-22 MED ORDER — FUROSEMIDE 10 MG/ML IJ SOLN
60.0000 mg | Freq: Once | INTRAMUSCULAR | Status: AC
  Administered 2019-12-22: 60 mg via INTRAVENOUS
  Filled 2019-12-22: qty 8

## 2019-12-22 MED ORDER — ASPIRIN EC 81 MG PO TBEC: 81.0000 mg | DELAYED_RELEASE_TABLET | Freq: Every day | ORAL | Status: DC

## 2019-12-22 MED ORDER — LISINOPRIL 20 MG PO TABS
40.0000 mg | ORAL_TABLET | Freq: Every day | ORAL | Status: DC
  Administered 2019-12-22 – 2019-12-25 (×4): 40 mg via ORAL
  Filled 2019-12-22: qty 2
  Filled 2019-12-22: qty 4
  Filled 2019-12-22 (×3): qty 2

## 2019-12-22 MED ORDER — INSULIN ASPART 100 UNIT/ML ~~LOC~~ SOLN
6.0000 [IU] | Freq: Three times a day (TID) | SUBCUTANEOUS | Status: DC
  Administered 2019-12-22 – 2019-12-25 (×10): 6 [IU] via SUBCUTANEOUS
  Filled 2019-12-22 (×10): qty 1

## 2019-12-22 MED ORDER — SODIUM CHLORIDE 0.9% FLUSH
3.0000 mL | Freq: Two times a day (BID) | INTRAVENOUS | Status: DC
  Administered 2019-12-22 – 2019-12-25 (×7): 3 mL via INTRAVENOUS

## 2019-12-22 MED ORDER — IOHEXOL 350 MG/ML SOLN
75.0000 mL | Freq: Once | INTRAVENOUS | Status: AC | PRN
  Administered 2019-12-22: 10:00:00 75 mL via INTRAVENOUS

## 2019-12-22 MED ORDER — ENOXAPARIN SODIUM 40 MG/0.4ML ~~LOC~~ SOLN: 40.0000 mg | SUBCUTANEOUS | Status: DC

## 2019-12-22 MED ORDER — METOPROLOL SUCCINATE ER 100 MG PO TB24
100.0000 mg | ORAL_TABLET | Freq: Every day | ORAL | Status: DC
  Administered 2019-12-22 – 2019-12-24 (×3): 100 mg via ORAL
  Filled 2019-12-22 (×2): qty 1
  Filled 2019-12-22: qty 2

## 2019-12-22 MED ORDER — INSULIN ASPART 100 UNIT/ML ~~LOC~~ SOLN
0.0000 [IU] | Freq: Three times a day (TID) | SUBCUTANEOUS | Status: DC
  Administered 2019-12-23: 13:00:00 4 [IU] via SUBCUTANEOUS
  Administered 2019-12-23 – 2019-12-25 (×4): 3 [IU] via SUBCUTANEOUS
  Filled 2019-12-22 (×5): qty 1

## 2019-12-22 MED ORDER — ENOXAPARIN SODIUM 40 MG/0.4ML ~~LOC~~ SOLN
40.0000 mg | Freq: Two times a day (BID) | SUBCUTANEOUS | Status: DC
  Administered 2019-12-22 – 2019-12-24 (×5): 40 mg via SUBCUTANEOUS
  Filled 2019-12-22 (×7): qty 0.4

## 2019-12-22 MED ORDER — SODIUM CHLORIDE 0.9% FLUSH
3.0000 mL | INTRAVENOUS | Status: DC | PRN
  Administered 2019-12-22: 12:00:00 3 mL via INTRAVENOUS

## 2019-12-22 MED ORDER — SODIUM CHLORIDE 0.9 % IV SOLN: 250.0000 mL | INTRAVENOUS | Status: DC | PRN

## 2019-12-22 MED ORDER — FUROSEMIDE 10 MG/ML IJ SOLN
40.0000 mg | Freq: Two times a day (BID) | INTRAMUSCULAR | Status: DC
  Administered 2019-12-22: 18:00:00 40 mg via INTRAVENOUS
  Filled 2019-12-22: qty 4

## 2019-12-22 MED ORDER — ONDANSETRON HCL 4 MG/2ML IJ SOLN: 4.0000 mg | Freq: Four times a day (QID) | INTRAMUSCULAR | Status: DC | PRN

## 2019-12-22 NOTE — H&P (Signed)
History and Physical    Charles Rangel ZDG:[id number] DOB: September 01, 1976 DOA: 12/22/2019  PCP: [facility]   Patient coming from: Home  I have personally briefly reviewed patient's old medical records in [facility]  Chief Complaint: Shortness of breath for 3 days  HPI: Charles Rangel is a 44 y.o. male with medical history significant for morbid obesity (BMI 51.3 kg/m2), non-insulin-dependent diabetes mellitus, diastolic CHF, history of DVT, obstructive sleep apnea and hypertension presented to the emergency room for evaluation of 3-day history of shortness of breath with exertion treated with 2 pillow orthopnea.  He denies having any lower extremity swelling or cough.  Patient states that he had a low-grade temp day prior to his admission, his temp was 99.74F and he was sent home from work and asked to get a Covid test.  He states that he has not had any recent exposure to anyone with COVID-19 infection.  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  He denies having any loss of taste or smell denies having any myalgias.  He had a rapid Covid test done in the emergency room which was negative.  ED Course:  Patient presented for evaluation of shortness of breath.  Rapid Covid was negative, BNP mildly elevated.  Chest x-ray not significantly changed from prior.  Patient noted to become tachycardic with increased work of breathing with ambulation.  Pulse oximetry is about 93% on room air.  He received a dose of   IV Lasix for presumed volume overload and a CT angiogram of the chest done to rule out PE since patient has a history of  DVT.  Review of Systems: As per HPI otherwise 10 point review of systems negative.    Past Medical History:  Diagnosis Date  . (HFpEF) heart failure with preserved ejection fraction (HCC)    a. 02/2018 Echo: EF 65-70%, mildly dil LA.  . CHF (congestive heart failure) (HCC)   . COPD (chronic obstructive pulmonary disease)  (HCC)   . Diabetes mellitus without complication (HCC)   . DVT (deep venous thrombosis) (HCC)   . Gout   . Hypertension   . Morbid obesity (HCC)   . OSA (obstructive sleep apnea)     Past Surgical History:  Procedure Laterality Date  . Left leg surgery for fracture       reports that he quit smoking about 9 years ago. He has a 20.00 pack-year smoking history. He has never used smokeless tobacco. He reports previous alcohol use. He reports that he does not use drugs.  Allergies  Allergen Reactions  . Tylenol [Acetaminophen] Anaphylaxis  . Dog Epithelium Allergy Skin Test Hives  . Apple Hives  . Other Hives    RAISINS    Family History  Problem Relation Age of Onset  . Hypertension Mother   . Hypertension Father   . CVA Father 68     Prior to Admission medications   Medication Sig Start Date End Date Taking? Authorizing Provider  albuterol (VENTOLIN HFA) 108 (90 Base) MCG/ACT inhaler Inhale 2 puffs into the lungs every 6 (six) hours as needed for wheezing or shortness of breath. 08/27/19  Yes Chesley Noon, MD  allopurinol (ZYLOPRIM) 100 MG tablet Take 1 tablet (100 mg total) by mouth daily. 11/18/18  Yes Lule, Joana, PA  aspirin EC 81 MG tablet Take 81 mg daily by mouth.   Yes [provider]  bumetanide (BUMEX) 2 MG tablet Take 1 tablet (  2 mg total) by mouth 2 (two) times daily for 30 days. 11/17/18 12/22/19 Yes Lule, Joana, PA  Cholecalciferol (VITAMIN D3) 1000 units CAPS Take 4 capsules by mouth daily.    Yes [provider]  colchicine 0.6 MG tablet Take 1 tablet (0.6 mg total) by mouth daily. Take 1 tab daily for at least 6 more days. If  Symptoms persist past 6 days continue to use until prescription is finished Patient taking differently: Take 0.6 mg by mouth every other day.  06/17/19  Yes Cuthriell, Charline Bills, PA-C  lisinopril (PRINIVIL,ZESTRIL) 40 MG tablet Take 1 tablet (40 mg total) by mouth daily for 30 doses. 11/18/18 12/22/19 Yes Lule, Joana, PA   metFORMIN (GLUCOPHAGE) 500 MG tablet Take 1,000 mg by mouth 2 (two) times daily with a meal.    Yes [provider]  metoprolol succinate (TOPROL-XL) 100 MG 24 hr tablet Take 100 mg by mouth daily. Take with or immediately following a meal.   Yes [provider]  Multiple Vitamin (MULTIVITAMIN WITH MINERALS) TABS tablet Take 1 tablet by mouth daily.   Yes [provider]    Physical Exam: Vitals:   12/22/19 0915 12/22/19 0930 12/22/19 0945 12/22/19 1000  BP: 138/84 (!) 149/79 (!) 152/78 134/77  Pulse: 98 (!) 103 97 96  Resp: 16 15 17  (!) 23  Temp:      TempSrc:      SpO2: 94% 93% 97% 97%  Weight:      Height:         Vitals:   12/22/19 0915 12/22/19 0930 12/22/19 0945 12/22/19 1000  BP: 138/84 (!) 149/79 (!) 152/78 134/77  Pulse: 98 (!) 103 97 96  Resp: 16 15 17  (!) 23  Temp:      TempSrc:      SpO2: 94% 93% 97% 97%  Weight:      Height:        Constitutional: NAD, alert and oriented to person place and time Eyes: PERRL, lids and conjunctivae normal ENMT: Mucous membranes are moist.  Neck: normal, supple, no masses, no thyromegaly Respiratory: Bilateral air entry, no wheezing, faint crackles at the bases. Normal respiratory effort. No accessory muscle use.  Cardiovascular: Regular rate and rhythm, no murmurs / rubs / gallops.  Trace extremity edema. 2+ pedal pulses. No carotid bruits.  Abdomen: no tenderness, no masses palpated. No hepatosplenomegaly. Bowel sounds positive.  Central adiposity Musculoskeletal: no clubbing / cyanosis. No joint deformity upper and lower extremities.  Skin: no rashes, lesions, ulcers, hyperpigmentation of the bilateral lower extremities Neurologic: No gross focal neurologic deficit. Psychiatric: Normal mood and affect.   Labs on Admission: I have personally reviewed following labs and imaging studies  CBC: Recent Labs  Lab 12/22/19 0755  WBC 6.2  HGB 15.2  HCT 48.7  MCV 82.4  PLT 149   Basic Metabolic  Panel: Recent Labs  Lab 12/22/19 0755  NA 144  K 3.8  CL 97*  CO2 35*  GLUCOSE 114*  BUN 19  CREATININE 1.38*  CALCIUM 10.1   GFR: Estimated Creatinine Clearance: 92.8 mL/min (A) (by C-G formula based on SCr of 1.38 mg/dL (H)). Liver Function Tests: Recent Labs  Lab 12/22/19 0755  AST 23  ALT 27  ALKPHOS 94  BILITOT 0.8  PROT 7.8  ALBUMIN 4.2   No results for input(s): LIPASE, AMYLASE in the last 168 hours. No results for input(s): AMMONIA in the last 168 hours. Coagulation Profile: No results for input(s): INR, PROTIME  in the last 168 hours. Cardiac Enzymes: No results for input(s): CKTOTAL, CKMB, CKMBINDEX, TROPONINI in the last 168 hours. BNP (last 3 results) No results for input(s): PROBNP in the last 8760 hours. HbA1C: No results for input(s): HGBA1C in the last 72 hours. CBG: No results for input(s): GLUCAP in the last 168 hours. Lipid Profile: No results for input(s): CHOL, HDL, LDLCALC, TRIG, CHOLHDL, LDLDIRECT in the last 72 hours. Thyroid Function Tests: No results for input(s): TSH, T4TOTAL, FREET4, T3FREE, THYROIDAB in the last 72 hours. Anemia Panel: No results for input(s): VITAMINB12, FOLATE, FERRITIN, TIBC, IRON, RETICCTPCT in the last 72 hours. Urine analysis:    Component Value Date/Time   COLORURINE YELLOW (A) 08/27/2017 0816   APPEARANCEUR CLEAR (A) 08/27/2017 0816   APPEARANCEUR Clear 07/28/2014 2317   LABSPEC 1.011 08/27/2017 0816   LABSPEC 1.024 07/28/2014 2317   PHURINE 6.0 08/27/2017 0816   GLUCOSEU NEGATIVE 08/27/2017 0816   GLUCOSEU Negative 07/28/2014 2317   HGBUR NEGATIVE 08/27/2017 0816   BILIRUBINUR NEGATIVE 08/27/2017 0816   BILIRUBINUR Negative 07/28/2014 2317   KETONESUR NEGATIVE 08/27/2017 0816   PROTEINUR NEGATIVE 08/27/2017 0816   NITRITE NEGATIVE 08/27/2017 0816   LEUKOCYTESUR NEGATIVE 08/27/2017 0816   LEUKOCYTESUR Negative 07/28/2014 2317    Radiological Exams on Admission: CT Angio Chest PE W and/or Wo  Contrast  Result Date: 12/22/2019 CLINICAL DATA:  CHF. COPD. Dyspnea. EXAM: CT ANGIOGRAPHY CHEST WITH CONTRAST TECHNIQUE: Multidetector CT imaging of the chest was performed using the standard protocol during bolus administration of intravenous contrast. Multiplanar CT image reconstructions and MIPs were obtained to evaluate the vascular anatomy. CONTRAST:  56mL OMNIPAQUE IOHEXOL 350 MG/ML SOLN COMPARISON:  08/27/2019 chest CT angiogram. Chest radiograph from earlier today. FINDINGS: Cardiovascular: The study is high quality for the evaluation of pulmonary embolism. There are no filling defects in the central, lobar, segmental or subsegmental pulmonary artery branches to suggest acute pulmonary embolism. Normal course and caliber of the thoracic aorta. Markedly dilated main pulmonary artery (4.1 cm diameter) with diffuse prominence of pulmonary arterial tree bilaterally. Top-normal heart size. No significant pericardial fluid/thickening. Mediastinum/Nodes: No discrete thyroid nodules. Unremarkable esophagus. No pathologically enlarged axillary, mediastinal or hilar lymph nodes. Lungs/Pleura: No pneumothorax. No pleural effusion. Prominent mosaic attenuation throughout both lungs, unchanged. No central airway stenoses. No acute consolidative airspace disease, lung masses or significant pulmonary nodules. Upper abdomen: Mild diffuse hepatic steatosis. Cholelithiasis. Musculoskeletal: No aggressive appearing focal osseous lesions. Mild thoracic spondylosis. Review of the MIP images confirms the above findings. IMPRESSION: 1. No acute pulmonary embolism. 2. Markedly dilated main pulmonary artery (4.1 cm diameter) with diffuse prominence of pulmonary arterial tree bilaterally, unchanged, almost certainly indicating severe chronic pulmonary arterial hypertension. 3. Chronic prominent mosaic attenuation throughout both lungs, unchanged, almost certainly due to mosaic perfusion from pulmonary vascular disease. 4. Mild  diffuse hepatic steatosis. 5. Cholelithiasis. Electronically Signed   By: Delbert Phenix M.D.   On: 12/22/2019 10:34   DG Chest Port 1 View  Result Date: 12/22/2019 CLINICAL DATA:  Shortness of breath for 2 days EXAM: PORTABLE CHEST 1 VIEW COMPARISON:  08/27/2019 FINDINGS: Cardiomediastinal contours are enlarged and accentuated by portable technique. Vascular congestion and signs of interstitial and airspace opacities with similar appearance to prior examinations. Without dense consolidation or evidence of pleural effusion. Visualized skeletal structures are unremarkable. IMPRESSION: Scattered interstitial and airspace opacities with bilateral relatively symmetric distribution may represent a mixture of edema and or pneumonitis but are without change since November of 2020. Electronically Signed  By: Donzetta Kohut M.D.   On: 12/22/2019 08:32    EKG: Independently reviewed.  Sinus rhythm  Assessment/Plan Active Problems:   Acute on chronic diastolic CHF (congestive heart failure) (HCC)   Gout   HTN (hypertension)   DM (diabetes mellitus) (HCC)   Obesity, Class III, BMI 40-49.9 (morbid obesity) (HCC)    Acute on chronic diastolic dysfunction CHF Patient presents for evaluation of shortness of breath and is noted to have an elevated BNP.  Chest x-ray shows pulmonary edema Start patient on furosemide 40 mg IV every 12 Continue lisinopril and metoprolol Check daily weight and maintain a low-sodium diet We will obtain 2D echocardiogram to assess LVEF Consult cardiology   Pulmonary hypertension Patient had a CT angiogram to rule out pulmonary embolism showed markedly dilated main pulmonary artery consistent with chronic pulmonary arterial hypertension Request pulmonology consult   Morbid obesity Complicates overall prognosis and care   Hypertension Continue lisinopril and metoprolol   Diabetes mellitus Patient is on Metformin which be placed on hold since he received IV  contrast Will place patient on sliding scale coverage Continue consistent carbohydrate diet   Gout Stable and not in acute exacerbation Continue allopurinol  DVT prophylaxis: Lovenox Code Status: Full code Family Communication: Plan of care was discussed with patient in detail he verbalizes understanding and agrees with the plan Disposition Plan: Back to previous home environment Consults called: Cardiology, pulmonology    Lejon Afzal MD [facility]     12/22/2019, 11:36 AM

## 2019-12-22 NOTE — ED Notes (Signed)
Report to Yessica, RN   

## 2019-12-22 NOTE — Consult Note (Signed)
Cardiology Consultation:   Patient ID: Ali Mohl; [id number]; 01/04/1976   Admit date: 12/22/2019 Date of Consult: 12/22/2019  Primary Care Provider: [facility] Primary Cardiologist: Kirke Corin   Patient Profile:   Charles Rangel is a 44 y.o. male with a hx of HFpEF, pulmonary hypertension, COPD, DM 2, remote DVT of the right lower extremity in the setting of prolonged travel, remote tobacco use, HTN, obesity, OSA with approximately 50% CPAP compliance, and gout who is being seen today for the evaluation of CHF at the request of Dr. Joylene Igo.  History of Present Illness:   Mr. Larmon was admitted to the hospital in 02/2018 with acute on chronic respiratory failure in the setting of COPD exacerbation and mild CHF.  Echo showed normal LV systolic function.  Chronically, he is followed at the [facility].  He was readmitted in 06/2018 with recurrent heart failure exacerbation and gout.  Most recently, he was admitted to [facility] in 10/2018 with acute on chronic diastolic CHF in the setting of dietary indiscretion and running out of medications.  Echo during that admission showed an EF of 60 to 65%, mild LVH, diastolic dysfunction, and no significant valvular abnormalities.  He has been followed by the [facility] CHF clinic as an outpatient, most recently seen them virtually in 03/2019 with a weight of 298 pounds by his home scale.  He was maintained on Bumex 2 mg twice daily.  He was seen in the ED in 08/2019 with pleuritic chest pain.  CTA chest was negative for PE.  There was suspicion for possible COVID-19 infection, though swab was negative.  High-sensitivity troponin negative x2.  EKG showed sinus rhythm with no acute ST-T changes.  He was advised to follow-up as an outpatient.  He was swabbed for COVID-19 as an outpatient on 12/21/2019 secondary to low grade temperature noted at work of 99.8, with results pending from that study.   He presented to [facility] on 12/22/2019 with  a 1 week history of increased shortness of breath.  Patient indicates as recent as early 1 week prior he was able to continue with his regular ambulation around the perimeter of his office building on his lunch break which he indicates typically took him approximately 15 to 20 minutes to do.  However, over the past 5 days he notes dyspnea with ambulation of 20 feet.  He has slept in a recliner with 3 pillows for the past 15 years at baseline and this is unchanged.  He notes his bilateral lower extremity swelling is unchanged.  He does report eating a diet high in salt, frequent restaurant food, and snacks high in sodium which indicates he has started to improve over the past week.  He does report compliance with his Bumex 2 mg twice daily at home.  He uses his CPAP approximately 4 nights per week that was unable to know how many hours each night as he states he takes it off at some point during the night when he does wear it.  He denies any chest pain, palpitations, fever, chills, dizziness, presyncope, or syncope.  Upon the patient's arrival to [facility] they were found to have BP predominantly in the 160s systolic with HR in the 90s to low 100s bpm, temp afebrile, oxygen saturation low to mid 90s% on room air, weight documented at 144.2 kg. EKG showed NSR, 96 bpm, borderline right axis deviation, no acute ST-T changes, CXR showed scattered interstitial and airspace opacities with bilateral symmetric distribution  representing possible edema versus pneumonitis that were unchanged when compared to study from 08/2019.  CTA chest was negative for PE with markedly dilated pulmonary artery with diffuse prominence of the pulmonary arterial tree bilaterally that was unchanged concerning for severe chronic pulmonary hypertension as well as chronic mosaic attenuation throughout the bilateral lungs that was unchanged felt to be from pulmonary vascular disease, mild diffuse hepatic steatosis, and cholelithiasis.  Labs showed  COVID-19 negative, initial high-sensitivity troponin of 8 with a delta of 6, BNP 232, potassium 3.8, BUN 19, serum creatinine 1.38. In the ED, he was given IV Lasix 60 mg x 1 this morning with a documented UOP of 300 mL for the admission to date. Upon admission, he was placed on IV Lasix 40 mg bid. Currently, he does report some improvement in shortness of breath.   Past Medical History:  Diagnosis Date   (HFpEF) heart failure with preserved ejection fraction (HCC)    a. 02/2018 Echo: EF 65-70%, mildly dil LA.   CHF (congestive heart failure) (HCC)    COPD (chronic obstructive pulmonary disease) (HCC)    Diabetes mellitus without complication (HCC)    DVT (deep venous thrombosis) (HCC)    Gout    Hypertension    Morbid obesity (HCC)    OSA (obstructive sleep apnea)     Past Surgical History:  Procedure Laterality Date   Left leg surgery for fracture       Home Meds: Prior to Admission medications   Medication Sig Start Date End Date Taking? Authorizing Provider  albuterol (VENTOLIN HFA) 108 (90 Base) MCG/ACT inhaler Inhale 2 puffs into the lungs every 6 (six) hours as needed for wheezing or shortness of breath. 08/27/19  Yes Chesley Noon, MD  allopurinol (ZYLOPRIM) 100 MG tablet Take 1 tablet (100 mg total) by mouth daily. 11/18/18  Yes Lule, Joana, PA  aspirin EC 81 MG tablet Take 81 mg daily by mouth.   Yes [provider]  bumetanide (BUMEX) 2 MG tablet Take 1 tablet (2 mg total) by mouth 2 (two) times daily for 30 days. 11/17/18 12/22/19 Yes Lule, Joana, PA  Cholecalciferol (VITAMIN D3) 1000 units CAPS Take 4 capsules by mouth daily.    Yes [provider]  colchicine 0.6 MG tablet Take 1 tablet (0.6 mg total) by mouth daily. Take 1 tab daily for at least 6 more days. If  Symptoms persist past 6 days continue to use until prescription is finished Patient taking differently: Take 0.6 mg by mouth every other day.  06/17/19  Yes Cuthriell, Delorise Royals, PA-C   lisinopril (PRINIVIL,ZESTRIL) 40 MG tablet Take 1 tablet (40 mg total) by mouth daily for 30 doses. 11/18/18 12/22/19 Yes Lule, Joana, PA  metFORMIN (GLUCOPHAGE) 500 MG tablet Take 1,000 mg by mouth 2 (two) times daily with a meal.    Yes [provider]  metoprolol succinate (TOPROL-XL) 100 MG 24 hr tablet Take 100 mg by mouth daily. Take with or immediately following a meal.   Yes [provider]  Multiple Vitamin (MULTIVITAMIN WITH MINERALS) TABS tablet Take 1 tablet by mouth daily.   Yes [provider]    Inpatient Medications: Scheduled Meds:  allopurinol  100 mg Oral Daily   [START ON 12/23/2019] aspirin EC  81 mg Oral Daily   cholecalciferol  4,000 Units Oral Daily   colchicine  0.6 mg Oral Daily   enoxaparin (LOVENOX) injection  40 mg Subcutaneous Q12H   furosemide  40 mg Intravenous BID  insulin aspart  0-20 Units Subcutaneous TID WC   insulin aspart  6 Units Subcutaneous TID WC   lisinopril  40 mg Oral Daily   metoprolol succinate  100 mg Oral Daily   multivitamin with minerals  1 tablet Oral Daily   sodium chloride flush  3 mL Intravenous Q12H   Continuous Infusions:  sodium chloride     PRN Meds: sodium chloride, ondansetron (ZOFRAN) IV, sodium chloride flush  Allergies:   Allergies  Allergen Reactions   Tylenol [Acetaminophen] Anaphylaxis   Dog Epithelium Allergy Skin Test Hives   Apple Hives   Other Hives    RAISINS    Social History:   Social History   Socioeconomic History   Marital status: Married    Spouse name: Not on file   Number of children: Not on file   Years of education: Not on file   Highest education level: Not on file  Occupational History   Not on file  Tobacco Use   Smoking status: Former Smoker    Packs/day: 1.00    Years: 20.00    Pack years: 20.00    Quit date: 10/20/2010    Years since quitting: 9.1   Smokeless tobacco: Never Used  Substance and Sexual Activity   Alcohol use:  Not Currently   Drug use: No   Sexual activity: Not on file  Other Topics Concern   Not on file  Social History Narrative   Lives in [place] with his wife.  Exercises @ the gym 4-5x/wk - treadmill @ 2-2.5 mph x 1 hr.   Social Determinants of Health   Financial Resource Strain:    Difficulty of Paying Living Expenses: Not on file  Food Insecurity:    Worried About Charity fundraiser in the Last Year: Not on file   [employer] of Food in the Last Year: Not on file  Transportation Needs:    Lack of Transportation (Medical): Not on file   Lack of Transportation (Non-Medical): Not on file  Physical Activity:    Days of Exercise per Week: Not on file   Minutes of Exercise per Session: Not on file  Stress:    Feeling of Stress : Not on file  Social Connections:    Frequency of Communication with Friends and Family: Not on file   Frequency of Social Gatherings with Friends and Family: Not on file   Attends Religious Services: Not on file   Active Member of Clubs or Organizations: Not on file   Attends Archivist Meetings: Not on file   Marital Status: Not on file  Intimate Partner Violence:    Fear of Current or Ex-Partner: Not on file   Emotionally Abused: Not on file   Physically Abused: Not on file   Sexually Abused: Not on file     Family History:   Family History  Problem Relation Age of Onset   Hypertension Mother    Hypertension Father    CVA Father 62    ROS:  Review of Systems  Constitutional: Positive for malaise/fatigue. Negative for chills, diaphoresis, fever and weight loss.  HENT: Negative for congestion.   Eyes: Negative for discharge and redness.  Respiratory: Positive for shortness of breath. Negative for cough, sputum production and wheezing.   Cardiovascular: Positive for orthopnea and leg swelling. Negative for chest pain, palpitations, claudication and PND.       At baseline, patient has slept in a recliner for 15 years  with 3 pillow  orthopnea.  This is unchanged.  Gastrointestinal: Negative for abdominal pain, heartburn, nausea and vomiting.  Musculoskeletal: Negative for falls and myalgias.  Skin: Negative for rash.  Neurological: Positive for weakness. Negative for dizziness, tingling, tremors, sensory change, speech change, focal weakness and loss of consciousness.  Endo/Heme/Allergies: Does not bruise/bleed easily.  Psychiatric/Behavioral: Negative for substance abuse. The patient is not nervous/anxious.   All other systems reviewed and are negative.     Physical Exam/Data:   Vitals:   12/22/19 0945 12/22/19 1000 12/22/19 1154 12/22/19 1201  BP: (!) 152/78 134/77  (!) 160/101  Pulse: 97 96 97 97  Resp: 17 (!) 23 (!) 22   Temp:   98.3 F (36.8 C)   TempSrc:   Oral   SpO2: 97% 97% 96%   Weight:      Height:        Intake/Output Summary (Last 24 hours) at 12/22/2019 1239 Last data filed at 12/22/2019 1129 Gross per 24 hour  Intake --  Output 300 ml  Net -300 ml   Filed Weights   12/22/19 0734  Weight: (!) 144.2 kg   Body mass index is 51.33 kg/m.   Physical Exam: General: Well developed, well nourished, in no acute distress. Head: Normocephalic, atraumatic, sclera non-icteric, no xanthomas, nares without discharge.  Neck: Negative for carotid bruits. JVD difficult to assess secondary to body habitus. Lungs: Diminished breath sounds bilaterally. Breathing is unlabored. Heart: Distant heart sounds, RRR with S1 S2. No murmurs, rubs, or gallops appreciated. Abdomen: Soft, non-tender, non-distended with normoactive bowel sounds. No hepatomegaly. No rebound/guarding. No obvious abdominal masses. Msk:  Strength and tone appear normal for age. Extremities: No clubbing or cyanosis.  Trace to 1+ bilateral lower extremity pitting edema. Distal pedal pulses are 2+ and equal bilaterally. Neuro: Alert and oriented X 3. No facial asymmetry. No focal deficit. Moves all extremities  spontaneously. Psych:  Responds to questions appropriately with a normal affect.   EKG:  The EKG was personally reviewed and demonstrates: NSR, 96 bpm, borderline right axis deviation, no acute ST-T changes Telemetry:  Telemetry was personally reviewed and demonstrates: SR  Weights: [employer]   12/22/19 0734  Weight: (!) 144.2 kg    Relevant CV Studies:  2D echo 10/2018: 1. The left ventricle has 60-65%. The cavity size is normal. There is  mild left ventricular hypertrophy. Echo evidence of impaired relaxation  diastolic filling patterns.  2. Normal left atrial size.  3. Normal right atrial size.  4. The mitral valve normal in structure.  5. Normal tricuspid valve.  6. Aortic valve normal.  7. No atrial level shunt detected by color flow Doppler.  __________  2D echo 02/2018: - Procedure narrative: The only obtainable view was parasternal.  Transthoracic echocardiography. The study was technically  difficult.  - Left ventricle: There was moderate concentric hypertrophy.  Systolic function was vigorous. The estimated ejection fraction  was in the range of 65% to 70%. The study is not technically  sufficient to allow evaluation of LV diastolic function.  - Left atrium: The atrium was mildly dilated.  - Pulmonary arteries: Systolic pressure could not be accurately  estimated.  __________  Eugenie Birks MPI 02/2011: IMPRESSION:   Adequate Lexiscan infusion. No evidence of stress-induced myocardial  ischemia.  Normal overall LV function. Would treat the patient medically  for now.   Laboratory Data:  Chemistry Recent Labs  Lab 12/22/19 0755  NA 144  K 3.8  CL 97*  CO2 35*  GLUCOSE 114*  BUN 19  CREATININE 1.38*  CALCIUM 10.1  GFRNONAA >60  GFRAA >60  ANIONGAP 12    Recent Labs  Lab 12/22/19 0755  PROT 7.8  ALBUMIN 4.2  AST 23  ALT 27  ALKPHOS 94  BILITOT 0.8   Hematology Recent Labs  Lab 12/22/19 0755 12/22/19 1110  WBC 6.2  6.4  RBC 5.91* 5.97*  HGB 15.2 15.5  HCT 48.7 49.1  MCV 82.4 82.2  MCH 25.7* 26.0  MCHC 31.2 31.6  RDW 18.2* 18.5*  PLT 263 263   Cardiac EnzymesNo results for input(s): TROPONINI in the last 168 hours. No results for input(s): TROPIPOC in the last 168 hours.  BNP Recent Labs  Lab 12/22/19 0755  BNP 232.0*    DDimer No results for input(s): DDIMER in the last 168 hours.  Radiology/Studies:  CT Angio Chest PE W and/or Wo Contrast  Result Date: 12/22/2019 IMPRESSION: 1. No acute pulmonary embolism. 2. Markedly dilated main pulmonary artery (4.1 cm diameter) with diffuse prominence of pulmonary arterial tree bilaterally, unchanged, almost certainly indicating severe chronic pulmonary arterial hypertension. 3. Chronic prominent mosaic attenuation throughout both lungs, unchanged, almost certainly due to mosaic perfusion from pulmonary vascular disease. 4. Mild diffuse hepatic steatosis. 5. Cholelithiasis. Electronically Signed   By: Delbert Phenix M.D.   On: 12/22/2019 10:34   DG Chest Port 1 View  Result Date: 12/22/2019 IMPRESSION: Scattered interstitial and airspace opacities with bilateral relatively symmetric distribution may represent a mixture of edema and or pneumonitis but are without change since November of 2020. Electronically Signed   By: Donzetta Kohut M.D.   On: 12/22/2019 08:32    Assessment and Plan:   1.  Acute on chronic diastolic CHF/severe pulmonary hypertension: -CTA chest suggestive of severe, chronic pulmonary hypertension -Volume status is difficult to assess on physical exam secondary to morbid obesity -2D echo pending -Continue with IV Lasix to maintain a net negative 1-2 L volume status per 24 hours with monitoring of renal function and potassium -Will need cath prior to discharge following diuresis to further estimate hemodynamics and gauge therapy -Following this, consider addition of sildenafil -Recommend outpatient referral to advanced heart failure  clinic in [place] -Recommend VQ scan as outlined below to evaluate for chronic PE -Given his history of DVT and body habitus, he is high risk for thromboembolism   2.  HTN: -Blood pressure is suboptimally controlled in the 160s systolic upon arrival -BP improved to the 130s systolic at this time -Continue IV diuresis along with metoprolol and lisinopril   3.  Morbid obesity with OSA and presumed OHS: -Recommend inpatient CPAP while sleeping, defer to primary team -He reports using his CPAP at home approximately 4 days/week that was uncertain how many hours per night as he indicates it is always off his face upon waking up in the morning -Weight loss advised  4.  AKI: -Renal function appears relatively stable and close to his approximate baseline most recently -Monitor with diuresis  5.  History of DVT: -This occurred in the setting of prolonged travel -Consider VQ scan to evaluate for chronic PE in the setting of his severe pulmonary hypertension (this is the study of choice for chronic PE), this will be deferred to the primary service   For questions or updates, please contact [facility] Please consult [email or web address] for contact info under Cardiology/STEMI.   Signed, Eula Listen, PA-C [facility] Pager: [phone] 12/22/2019, 12:39 PM

## 2019-12-22 NOTE — ED Provider Notes (Signed)
[facility] Emergency Department Provider Note   ____________________________________________    I have reviewed the triage vital signs and the nursing notes.   HISTORY  Chief Complaint Shortness of Breath and Headache     HPI Charles Rangel is a 44 y.o. male with history of heart failure, COPD, diabetes, DVT, hypertension, sleep apnea who presents with complaints of shortness of breath.  Patient reports shortness of breath over the last 2 to 3 days especially with ambulation.  He also reports that he has had headaches over the last couple of days which is unusual for him.  He reports at work yesterday his temperature was 99.8.  He was concerned enough that he had a Covid test done at [facility] yesterday.  Denies significant cough.  No loss of taste or smell.  Past Medical History:  Diagnosis Date  . (HFpEF) heart failure with preserved ejection fraction (HCC)    a. 02/2018 Echo: EF 65-70%, mildly dil LA.  . CHF (congestive heart failure) (HCC)   . COPD (chronic obstructive pulmonary disease) (HCC)   . Diabetes mellitus without complication (HCC)   . DVT (deep venous thrombosis) (HCC)   . Gout   . Hypertension   . Morbid obesity (HCC)   . OSA (obstructive sleep apnea)     Patient Active Problem List   Diagnosis Date Noted  . HTN (hypertension) 11/23/2018  . DM (diabetes mellitus) (HCC) 11/23/2018  . Gout 11/16/2018  . CHF (congestive heart failure) (HCC) 11/14/2018  . Acute on chronic diastolic CHF (congestive heart failure) (HCC) 06/28/2018    Past Surgical History:  Procedure Laterality Date  . Left leg surgery for fracture      Prior to Admission medications   Medication Sig Start Date End Date Taking? Authorizing Provider  albuterol (VENTOLIN HFA) 108 (90 Base) MCG/ACT inhaler Inhale 2 puffs into the lungs every 6 (six) hours as needed for wheezing or shortness of breath. 08/27/19   Chesley Noon, MD  allopurinol (ZYLOPRIM) 100 MG tablet  Take 1 tablet (100 mg total) by mouth daily. 11/18/18   Stormy Fabian, PA  aspirin EC 81 MG tablet Take 81 mg daily by mouth.    [provider]  bumetanide (BUMEX) 2 MG tablet Take 1 tablet (2 mg total) by mouth 2 (two) times daily for 30 days. 11/17/18 03/29/19  Stormy Fabian, PA  Cholecalciferol (VITAMIN D3) 1000 units CAPS Take 4 capsules by mouth daily.     [provider]  colchicine 0.6 MG tablet Take 1 tablet (0.6 mg total) by mouth daily. Take 1 tab daily for at least 6 more days. If  Symptoms persist past 6 days continue to use until prescription is finished 06/17/19   Cuthriell, Christiane Ha D, PA-C  lisinopril (PRINIVIL,ZESTRIL) 40 MG tablet Take 1 tablet (40 mg total) by mouth daily for 30 doses. 11/18/18 03/29/19  Stormy Fabian, PA  metFORMIN (GLUCOPHAGE) 500 MG tablet Take 1,000 mg by mouth 2 (two) times daily with a meal.     [provider]  metoprolol succinate (TOPROL-XL) 100 MG 24 hr tablet Take 100 mg by mouth daily. Take with or immediately following a meal.    [provider]  predniSONE (DELTASONE) 50 MG tablet Take 1 tablet (50 mg total) by mouth daily with breakfast. 06/17/19   Cuthriell, Delorise Royals, PA-C     Allergies Tylenol [acetaminophen], Dog epithelium allergy skin test, Apple, and Other  Family History  Problem Relation Age of Onset  .  Hypertension Mother   . Hypertension Father   . CVA Father 57    Social History Social History   Tobacco Use  . Smoking status: Former Smoker    Packs/day: 1.00    Years: 20.00    Pack years: 20.00    Quit date: 10/20/2010    Years since quitting: 9.1  . Smokeless tobacco: Never Used  Substance Use Topics  . Alcohol use: Not Currently  . Drug use: No    Review of Systems  Constitutional: As above Eyes: No visual changes.  ENT: No sore throat.  No rhinorrhea Cardiovascular: No chest pain Respiratory: As above Gastrointestinal: No abdominal pain.  No nausea, no vomiting.   Genitourinary:  Negative for dysuria. Musculoskeletal: Negative for back pain. Skin: Negative for rash. Neurological: Headaches, no weakness   ____________________________________________   PHYSICAL EXAM:  VITAL SIGNS: ED Triage Vitals  Enc Vitals Group     BP 12/22/19 0736 (!) 167/104     Pulse Rate 12/22/19 0736 98     Resp 12/22/19 0736 18     Temp 12/22/19 0736 98.8 F (37.1 C)     Temp Source 12/22/19 0736 Oral     SpO2 12/22/19 0736 93 %     Weight 12/22/19 0734 (!) 144.2 kg (318 lb)     Height 12/22/19 0734 1.676 m (5\' 6" )     Head Circumference --      Peak Flow --      Pain Score 12/22/19 0734 9     Pain Loc --      Pain Edu? --      Excl. in GC? --     Constitutional: Alert and oriented.  Eyes: Conjunctivae are normal.  PERRLA, EOMI Head: Atraumatic. Nose: No congestion/rhinnorhea. Mouth/Throat: Mucous membranes are moist.   Neck:  Painless ROM Cardiovascular: Normal rate, regular rhythm. Grossly normal heart sounds.  Good peripheral circulation. Respiratory: Normal respiratory effort.  No retractions. Lungs CTAB.   Musculoskeletal: No lower extremity tenderness nor edema.  Warm and well perfused Neurologic:  Normal speech and language. No gross focal neurologic deficits are appreciated.  Skin:  Skin is warm, dry and intact. No rash noted. Psychiatric: Mood and affect are normal. Speech and behavior are normal.  ____________________________________________   LABS (all labs ordered are listed, but only abnormal results are displayed)  Labs Reviewed  CBC - Abnormal; Notable for the following components:      Result Value   RBC 5.91 (*)    MCH 25.7 (*)    RDW 18.2 (*)    All other components within normal limits  COMPREHENSIVE METABOLIC PANEL - Abnormal; Notable for the following components:   Chloride 97 (*)    CO2 35 (*)    Glucose, Bld 114 (*)    Creatinine, Ser 1.38 (*)    All other components within normal limits  BRAIN NATRIURETIC PEPTIDE - Abnormal;  Notable for the following components:   B Natriuretic Peptide 232.0 (*)    All other components within normal limits  SARS CORONAVIRUS 2 (TAT 6-24 HRS)  POC SARS CORONAVIRUS 2 AG -  ED  POC SARS CORONAVIRUS 2 AG  TROPONIN I (HIGH SENSITIVITY)   ____________________________________________  EKG  ED ECG REPORT I, 02/21/20, the attending physician, personally viewed and interpreted this ECG.  Date: 12/22/2019  Rhythm: normal sinus rhythm QRS Axis: normal Intervals: normal ST/T Wave abnormalities: normal Narrative Interpretation: no evidence of acute ischemia  ____________________________________________  RADIOLOGY  Chest x-ray ____________________________________________  PROCEDURES  Procedure(s) performed: No  Procedures   Critical Care performed: No ____________________________________________   INITIAL IMPRESSION / ASSESSMENT AND PLAN / ED COURSE  Pertinent labs & imaging results that were available during my care of the patient were reviewed by me and considered in my medical decision making (see chart for details).  Patient presents with shortness of breath.  Differential includes CHF exacerbation, COPD exacerbation, COVID-19.  No significant wheezing on exam, doubt COPD.  No chest pain to suggest ACS or PE.  Will obtain chest x-ray, rapid Covid, labs and reevaluate.  Rapid Covid is negative, lab work is overall reassuring, BNP mildly elevated.  Chest x-ray not significantly changed from prior.  Patient does become tachycardic with increased work of breathing with ambulation.  We will give IV Lasix for presumed volume overload, have discussed with the hospitalist for admission.  She has requested CT angiography to evaluate for PE which she will follow up on.    ____________________________________________   FINAL CLINICAL IMPRESSION(S) / ED DIAGNOSES  Final diagnoses:  Shortness of breath        Note:  This document was prepared using Dragon  voice recognition software and may include unintentional dictation errors.   Lavonia Drafts, MD 12/22/19 1002

## 2019-12-22 NOTE — Progress Notes (Signed)
Anticoagulation monitoring(Lovenox):  44yo  male ordered Lovenox 40 mg Q24h for DVT prevention  Filed Weights   12/22/19 0734  Weight: (!) 318 lb (144.2 kg)   BMI 51.3   Lab Results  Component Value Date   CREATININE 1.38 (H) 12/22/2019   CREATININE 1.56 (H) 08/27/2019   CREATININE 1.26 (H) 11/17/2018   Estimated Creatinine Clearance: 92.8 mL/min (A) (by C-G formula based on SCr of 1.38 mg/dL (H)). Hemoglobin & Hematocrit     Component Value Date/Time   HGB 15.2 12/22/2019 0755   HGB 14.8 07/28/2014 2317   HCT 48.7 12/22/2019 0755   HCT 46.2 07/28/2014 2317     Per Protocol for Patient with estCrcl > 30 ml/min and BMI > 40, will transition to Lovenox 40 mg Q12h.     Clovia Cuff, PharmD, BCPS 12/22/2019 11:34 AM

## 2019-12-22 NOTE — ED Triage Notes (Signed)
Pt c/o sob X2 days and headache. HX CHF.

## 2019-12-22 NOTE — ED Notes (Addendum)
Pt provided blanket and with food tray in accordance with diet order and 4oz water.

## 2019-12-22 NOTE — ED Notes (Signed)
This [facility] for meds verification and requesting missing medications.

## 2019-12-22 NOTE — TOC Initial Note (Signed)
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 Patient states he developed CHF 7 years ago and quit smoking at that time. Patient states he monitors his weight daily for gain and avoids high salt foods. Active with his PCP and cardiologist. Patient has a walker for when his gout is flared. Patient has no needs at this time.   Expected Discharge Plan: Home/Self Care     Patient Goals and CMS Choice Patient states their goals for this hospitalization and ongoing recovery are:: get feeling better      Expected Discharge Plan and Services Expected Discharge Plan: Home/Self Care       Living arrangements for the past 2 months: Single Family Home                                      Prior Living Arrangements/Services Living arrangements for the past 2 months: Single Family Home Lives with:: Spouse, Relatives Patient language and need for interpreter reviewed:: Yes Do you feel safe going back to the place where you live?: Yes      Need for Family Participation in Patient Care: Yes (Comment) Care giver support system in place?: Yes (comment) Current home services: DME(walker) Criminal Activity/Legal Involvement Pertinent to Current Situation/Hospitalization: No - Comment as needed  Activities of Daily Living      Permission Sought/Granted                  Emotional Assessment Appearance:: Appears older than stated age Attitude/Demeanor/Rapport: Engaged Affect (typically observed): Accepting Orientation: : Oriented to Self, Oriented to Place, Oriented to  Time, Oriented to Situation Alcohol / Substance Use: Tobacco Use(previous smoker-quit  7 years ago) Psych Involvement: No (comment)  Admission diagnosis:  Acute on chronic diastolic CHF (congestive heart failure) (HCC) [I50.33] Patient Active Problem List   Diagnosis Date Noted  . Obesity, Class III, BMI 40-49.9 (morbid obesity) (HCC) 12/22/2019  . HTN (hypertension) 11/23/2018  . DM (diabetes mellitus) (HCC) 11/23/2018  . Gout 11/16/2018  . CHF (congestive heart failure) (HCC) 11/14/2018  . Acute on chronic diastolic CHF (congestive heart failure) (HCC) 06/28/2018   PCP:  [facility]:   [facility] - [place], [place] - [address] [place] [address] Phone: [phone] Fax: [phone]  [id number] - [place], [place] - [address] [place] [address] Phone: [phone] Fax: [phone]     Social Determinants of Health (SDOH) Interventions    Readmission Risk Interventions No flowsheet data found.

## 2019-12-22 NOTE — Progress Notes (Signed)
*  PRELIMINARY RESULTS* Echocardiogram 2D Echocardiogram has been performed.  Cristela Blue 12/22/2019, 2:14 PM

## 2019-12-22 NOTE — Consult Note (Signed)
Name: Charles Rangel MRN: [id number] DOB: [birth year]     CONSULTATION DATE:  REFERRING MD :   CHIEF COMPLAINT: SOB  STUDIES:     CT chest Independently reviewed by Me bilateral interstitial process Prominent mosaic attenuation throughout both lungs  HISTORY OF PRESENT ILLNESS: 44 y.o. male with  morbid obesity (BMI 51.3 kg/m2), non-insulin-dependent diabetes mellitus, diastolic CHF, history of DVT, obstructive sleep apnea and hypertension presented to the emergency room for evaluation of 3-day history of shortness of breath with exertion treated with 2 pillow orthopnea.    He denies having any lower extremity swelling or cough.   Patient states that he had a low-grade temp day prior to his admission, his temp was 99.55F and he was sent home from work and asked to get a Covid test.    He states that he has not had any recent exposure to anyone with COVID-19 infection.  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  He denies having any loss of taste or smell denies having any myalgias.  He had a rapid Covid test done in the emergency room which was negative.  ED Course:  Patient presented for evaluation of shortness of breath. Rapid Covid was negative, BNP mildly elevated. Chest x-ray not significantly changed from prior. Patient noted to become tachycardic with increased work of breathing with ambulation.  Pulse oximetry is about 93% on room air.  He received a dose of IV Lasix for presumed volume overload and a CT angiogram of the chest done to rule out PE since patient has a history of  DVT.  CT CHEST NEG PE PATIENT WITH DILATED PA  PREVIOUS ECHO REPORTS 10/2018 ECHO PENDING THIS ADMISSION  PATIENT WITH DX OF OSA AND NONCOMPLIANCE SEES CARDIOLOGY FOR HFpEF and Diastolic dysfunction      PAST MEDICAL HISTORY :   has a past medical history of (HFpEF) heart failure with preserved ejection fraction ([place]), CHF (congestive heart failure) ([place]),  COPD (chronic obstructive pulmonary disease) ([place]), Diabetes mellitus without complication ([place]), DVT (deep venous thrombosis) ([place]), Gout, Hypertension, Morbid obesity ([place]), and OSA (obstructive sleep apnea).  has a past surgical history that includes Left leg surgery for fracture. Prior to Admission medications   Medication Sig Start Date End Date Taking? Authorizing Provider  albuterol (VENTOLIN HFA) 108 (90 Base) MCG/ACT inhaler Inhale 2 puffs into the lungs every 6 (six) hours as needed for wheezing or shortness of breath. 08/27/19  Yes Blake Divine, MD  allopurinol (ZYLOPRIM) 100 MG tablet Take 1 tablet (100 mg total) by mouth daily. 11/18/18  Yes Lule, Joana, PA  aspirin EC 81 MG tablet Take 81 mg daily by mouth.   Yes [provider]  bumetanide (BUMEX) 2 MG tablet Take 1 tablet (2 mg total) by mouth 2 (two) times daily for 30 days. 11/17/18 12/22/19 Yes Lule, Joana, PA  Cholecalciferol (VITAMIN D3) 1000 units CAPS Take 4 capsules by mouth daily.    Yes [provider]  colchicine 0.6 MG tablet Take 1 tablet (0.6 mg total) by mouth daily. Take 1 tab daily for at least 6 more days. If  Symptoms persist past 6 days continue to use until prescription is finished Patient taking differently: Take 0.6 mg by mouth every other day.  06/17/19  Yes Cuthriell, Charline Bills, PA-C  lisinopril (PRINIVIL,ZESTRIL) 40 MG tablet Take 1 tablet (40 mg total) by mouth daily for 30 doses. 11/18/18 12/22/19 Yes Lule, Sara Chu, PA  metFORMIN (GLUCOPHAGE)  500 MG tablet Take 1,000 mg by mouth 2 (two) times daily with a meal.    Yes [provider]  metoprolol succinate (TOPROL-XL) 100 MG 24 hr tablet Take 100 mg by mouth daily. Take with or immediately following a meal.   Yes [provider]  Multiple Vitamin (MULTIVITAMIN WITH MINERALS) TABS tablet Take 1 tablet by mouth daily.   Yes [provider]   Allergies  Allergen Reactions  . Tylenol [Acetaminophen] Anaphylaxis  . Dog  Epithelium Allergy Skin Test Hives  . Apple Hives  . Other Hives    RAISINS    FAMILY HISTORY:  family history includes CVA (age of onset: 66) in his father; Hypertension in his father and mother. SOCIAL HISTORY:  reports that he quit smoking about 9 years ago. He has a 20.00 pack-year smoking history. He has never used smokeless tobacco. He reports previous alcohol use. He reports that he does not use drugs.    Review of Systems:  Gen:  Denies  fever, sweats, chills weigh loss  HEENT: Denies blurred vision, double vision, ear pain, eye pain, hearing loss, nose bleeds, sore throat Cardiac:  No dizziness, chest pain or heaviness, chest tightness,edema, No JVD Resp:   No cough, -sputum production, -shortness of breath,-wheezing, -hemoptysis,  Gi: Denies swallowing difficulty, stomach pain, nausea or vomiting, diarrhea, constipation, bowel incontinence Gu:  Denies bladder incontinence, burning urine Ext:   Denies Joint pain, stiffness or swelling Skin: Denies  skin rash, easy bruising or bleeding or hives Endoc:  Denies polyuria, polydipsia , polyphagia or weight change Psych:   Denies depression, insomnia or hallucinations  Other:  All other systems negative     VITAL SIGNS: Temp:  [98.3 F (36.8 C)-98.8 F (37.1 C)] 98.3 F (36.8 C) (03/04 1304) Pulse Rate:  [83-115] 83 (03/04 1304) Resp:  [13-23] 19 (03/04 1304) BP: (134-167)/(77-104) 136/85 (03/04 1304) SpO2:  [91 %-97 %] 96 % (03/04 1304) Weight:  [144.2 kg-144.9 kg] 144.9 kg (03/04 1301)     SpO2: 96 %    Physical Examination:   GENERAL:NAD, no fevers, chills, no weakness no fatigue HEAD: Normocephalic, atraumatic.  EYES: PERLA, EOMI No scleral icterus.  MOUTH: Moist mucosal membrane.  EAR, NOSE, THROAT: Clear without exudates. No external lesions.  NECK: Supple.  PULMONARY: CTA B/L no wheezing, rhonchi, crackles CARDIOVASCULAR: S1 and S2. Regular rate and rhythm. No murmurs GASTROINTESTINAL: Soft,  nontender, nondistended. Positive bowel sounds.  MUSCULOSKELETAL: No swelling, clubbing, or edema.  NEUROLOGIC: No gross focal neurological deficits. 5/5 strength all extremities SKIN: No ulceration, lesions, rashes, or cyanosis.  PSYCHIATRIC: Insight, judgment intact. -depression -anxiety ALL OTHER ROS ARE NEGATIVE   MEDICATIONS: I have reviewed all medications and confirmed regimen as documented    CULTURE RESULTS   No results found for this or any previous visit (from the past 240 hour(s)).        IMAGING    CT Angio Chest PE W and/or Wo Contrast  Result Date: 12/22/2019 CLINICAL DATA:  CHF. COPD. Dyspnea. EXAM: CT ANGIOGRAPHY CHEST WITH CONTRAST TECHNIQUE: Multidetector CT imaging of the chest was performed using the standard protocol during bolus administration of intravenous contrast. Multiplanar CT image reconstructions and MIPs were obtained to evaluate the vascular anatomy. CONTRAST:  46mL OMNIPAQUE IOHEXOL 350 MG/ML SOLN COMPARISON:  08/27/2019 chest CT angiogram. Chest radiograph from earlier today. FINDINGS: Cardiovascular: The study is high quality for the evaluation of pulmonary embolism. There are no filling defects in the central, lobar, segmental or subsegmental  pulmonary artery branches to suggest acute pulmonary embolism. Normal course and caliber of the thoracic aorta. Markedly dilated main pulmonary artery (4.1 cm diameter) with diffuse prominence of pulmonary arterial tree bilaterally. Top-normal heart size. No significant pericardial fluid/thickening. Mediastinum/Nodes: No discrete thyroid nodules. Unremarkable esophagus. No pathologically enlarged axillary, mediastinal or hilar lymph nodes. Lungs/Pleura: No pneumothorax. No pleural effusion. Prominent mosaic attenuation throughout both lungs, unchanged. No central airway stenoses. No acute consolidative airspace disease, lung masses or significant pulmonary nodules. Upper abdomen: Mild diffuse hepatic steatosis.  Cholelithiasis. Musculoskeletal: No aggressive appearing focal osseous lesions. Mild thoracic spondylosis. Review of the MIP images confirms the above findings. IMPRESSION: 1. No acute pulmonary embolism. 2. Markedly dilated main pulmonary artery (4.1 cm diameter) with diffuse prominence of pulmonary arterial tree bilaterally, unchanged, almost certainly indicating severe chronic pulmonary arterial hypertension. 3. Chronic prominent mosaic attenuation throughout both lungs, unchanged, almost certainly due to mosaic perfusion from pulmonary vascular disease. 4. Mild diffuse hepatic steatosis. 5. Cholelithiasis. Electronically Signed   By: Delbert Phenix M.D.   On: 12/22/2019 10:34   DG Chest Port 1 View  Result Date: 12/22/2019 CLINICAL DATA:  Shortness of breath for 2 days EXAM: PORTABLE CHEST 1 VIEW COMPARISON:  08/27/2019 FINDINGS: Cardiomediastinal contours are enlarged and accentuated by portable technique. Vascular congestion and signs of interstitial and airspace opacities with similar appearance to prior examinations. Without dense consolidation or evidence of pleural effusion. Visualized skeletal structures are unremarkable. IMPRESSION: Scattered interstitial and airspace opacities with bilateral relatively symmetric distribution may represent a mixture of edema and or pneumonitis but are without change since November of 2020. Electronically Signed   By: Donzetta Kohut M.D.   On: 12/22/2019 08:32       ASSESSMENT AND PLAN SYNOPSIS    Patient/Family are satisfied with Plan of action and management. All questions answered  Lucie Leather, M.D.  [facility]  Medical Director [facility] Medical Director [facility] Cardio-Pulmonary Department

## 2019-12-23 LAB — BASIC METABOLIC PANEL
Anion gap: 12 (ref 5–15)
BUN: 22 mg/dL — ABNORMAL HIGH (ref 6–20)
CO2: 30 mmol/L (ref 22–32)
Calcium: 10.4 mg/dL — ABNORMAL HIGH (ref 8.9–10.3)
Chloride: 100 mmol/L (ref 98–111)
Creatinine, Ser: 1.28 mg/dL — ABNORMAL HIGH (ref 0.61–1.24)
GFR calc Af Amer: >60 mL/min (ref >60)
GFR calc non Af Amer: >60 mL/min (ref >60)
Glucose, Bld: 113 mg/dL — ABNORMAL HIGH (ref 70–99)
Potassium: 4.9 mmol/L (ref 3.5–5.1)
Sodium: 142 mmol/L (ref 135–145)

## 2019-12-23 LAB — SARS CORONAVIRUS 2 (TAT 6-24 HRS): SARS Coronavirus 2: NEGATIVE

## 2019-12-23 LAB — GLUCOSE, CAPILLARY
Glucose-Capillary: 101 mg/dL — ABNORMAL HIGH (ref 70–99)
Glucose-Capillary: 156 mg/dL — ABNORMAL HIGH (ref 70–99)
Glucose-Capillary: 123 mg/dL — ABNORMAL HIGH (ref 70–99)
Glucose-Capillary: 100 mg/dL — ABNORMAL HIGH (ref 70–99)

## 2019-12-23 MED ORDER — ENSURE MAX PROTEIN PO LIQD
11.0000 [oz_av] | Freq: Two times a day (BID) | ORAL | Status: DC
  Administered 2019-12-23 – 2019-12-25 (×5): 11 [oz_av] via ORAL
  Filled 2019-12-23: qty 330

## 2019-12-23 MED ORDER — FUROSEMIDE 10 MG/ML IJ SOLN
80.0000 mg | Freq: Two times a day (BID) | INTRAMUSCULAR | Status: DC
  Administered 2019-12-23: 80 mg via INTRAVENOUS
  Filled 2019-12-23: qty 8

## 2019-12-23 MED ORDER — POTASSIUM CHLORIDE CRYS ER 20 MEQ PO TBCR
20.0000 meq | EXTENDED_RELEASE_TABLET | Freq: Two times a day (BID) | ORAL | Status: DC
  Administered 2019-12-23 – 2019-12-25 (×5): 20 meq via ORAL
  Filled 2019-12-23 (×5): qty 1

## 2019-12-23 MED ORDER — FUROSEMIDE 10 MG/ML IJ SOLN
80.0000 mg | Freq: Two times a day (BID) | INTRAMUSCULAR | Status: DC
  Administered 2019-12-23: 09:00:00 80 mg via INTRAVENOUS
  Filled 2019-12-23: qty 8

## 2019-12-23 NOTE — Progress Notes (Signed)
Progress Note  Patient Name: Charles Rangel Date of Encounter: 12/23/2019  Primary Cardiologist: Kirke Corin  Subjective   SOB perhaps slightly better. Weight 144.9-->145.2 kg. Documented UOP of 1.6 L for the admission to date. Uncertain accuracy input. BP 130s to 140s systolic. BUN/SCr 19/1.38-->22/1.28. Potassium 3.8-->4.9 (with hemolysis). No chest pain. Slept in recliner. Did not have CPAP ordered overnight.    Inpatient Medications    Scheduled Meds: . allopurinol  100 mg Oral Daily  . aspirin EC  81 mg Oral Daily  . cholecalciferol  4,000 Units Oral Daily  . colchicine  0.6 mg Oral Daily  . enoxaparin (LOVENOX) injection  40 mg Subcutaneous Q12H  . furosemide  40 mg Intravenous BID  . insulin aspart  0-20 Units Subcutaneous TID WC  . insulin aspart  6 Units Subcutaneous TID WC  . lisinopril  40 mg Oral Daily  . metoprolol succinate  100 mg Oral Daily  . multivitamin with minerals  1 tablet Oral Daily  . sodium chloride flush  3 mL Intravenous Q12H   Continuous Infusions: . sodium chloride     PRN Meds: sodium chloride, ondansetron (ZOFRAN) IV, sodium chloride flush   Vital Signs    Vitals:   12/22/19 1301 12/22/19 1304 12/22/19 1929 12/23/19 0413  BP:  136/85 (!) 139/91 (!) 153/92  Pulse:  83 86 80  Resp:  19 18 20   Temp:  98.3 F (36.8 C) 97.8 F (36.6 C) 97.9 F (36.6 C)  TempSrc:  Oral Oral Oral  SpO2:  96% 95% 95%  Weight: (!) 144.9 kg   (!) 145.2 kg  Height: 5\' 6"  (1.676 m)       Intake/Output Summary (Last 24 hours) at 12/23/2019 0655 Last data filed at 12/23/2019 0525 Gross per 24 hour  Intake 240 ml  Output 1850 ml  Net -1610 ml   Filed Weights   12/22/19 0734 12/22/19 1301 12/23/19 0413  Weight: (!) 144.2 kg (!) 144.9 kg (!) 145.2 kg    Telemetry    SR - Personally Reviewed  ECG    No new tracings - Personally Reviewed  Physical Exam   GEN: No acute distress.   Neck: JVD difficult to assess secondary to body habitus. Cardiac: RRR,  no murmurs, rubs, or gallops.  Respiratory: Clear to auscultation bilaterally.  GI: Soft, nontender, non-distended.   MS: 1+ bilateral pitting lower extremity edema with chronic hyperpigmented woody appearance; No deformity. Neuro:  Alert and oriented x 3; Nonfocal.  Psych: Normal affect.  Labs    Chemistry Recent Labs  Lab 12/22/19 0755  NA 144  K 3.8  CL 97*  CO2 35*  GLUCOSE 114*  BUN 19  CREATININE 1.38*  CALCIUM 10.1  PROT 7.8  ALBUMIN 4.2  AST 23  ALT 27  ALKPHOS 94  BILITOT 0.8  GFRNONAA >60  GFRAA >60  ANIONGAP 12     Hematology Recent Labs  Lab 12/22/19 0755 12/22/19 1110  WBC 6.2 6.4  RBC 5.91* 5.97*  HGB 15.2 15.5  HCT 48.7 49.1  MCV 82.4 82.2  MCH 25.7* 26.0  MCHC 31.2 31.6  RDW 18.2* 18.5*  PLT 263 263    Cardiac EnzymesNo results for input(s): TROPONINI in the last 168 hours. No results for input(s): TROPIPOC in the last 168 hours.   BNP Recent Labs  Lab 12/22/19 0755  BNP 232.0*     DDimer No results for input(s): DDIMER in the last 168 hours.   Radiology  CT Angio Chest PE W and/or Wo Contrast  Result Date: 12/22/2019 IMPRESSION: 1. No acute pulmonary embolism. 2. Markedly dilated main pulmonary artery (4.1 cm diameter) with diffuse prominence of pulmonary arterial tree bilaterally, unchanged, almost certainly indicating severe chronic pulmonary arterial hypertension. 3. Chronic prominent mosaic attenuation throughout both lungs, unchanged, almost certainly due to mosaic perfusion from pulmonary vascular disease. 4. Mild diffuse hepatic steatosis. 5. Cholelithiasis. Electronically Signed   By: Ilona Sorrel M.D.   On: 12/22/2019 10:34   DG Chest Port 1 View  Result Date: 12/22/2019 IMPRESSION: Scattered interstitial and airspace opacities with bilateral relatively symmetric distribution may represent a mixture of edema and or pneumonitis but are without change since November of 2020. Electronically Signed   By: Zetta Bills M.D.    On: 12/22/2019 08:32    Cardiac Studies   2D echo 12/22/2019: 1. Left ventricular ejection fraction, by estimation, is 60 to 65%. The  left ventricle has normal function. The left ventricle has no regional  wall motion abnormalities. There is moderate left ventricular hypertrophy.  Left ventricular diastolic parameters are indeterminate.  2. Right ventricular systolic function is normal. The right ventricular  size is normal.  3. Left atrial size was mildly dilated.   Patient Profile     44 y.o. male with history of HFpEF, pulmonary hypertension, COPD, DM 2, remote DVT of the right lower extremity in the setting of prolonged travel, remote tobacco use, HTN, obesity, OSA with approximately 50% CPAP compliance, and gout who is being seen today for the evaluation of CHF.  Assessment & Plan    1. Acute on chronic diastolic CHF/severe pulmonary hypertension: -CTA chest suggestive of severe, chronic pulmonary hypertension -He remains volume up -2D echo as above -Increase IV Lasix to 80 mg bid with KCl repletion with recommendation to maintain a net negative 1-2 L volume status per 24 hours with monitoring of renal function and potassium -Will need RHC prior to discharge following diuresis to further estimate hemodynamics and gauge therapy -He is currently unable to lay supine secondary to orthopnea, precluding cath at this time -Following this, consider addition of sildenafil -Recommend outpatient referral to advanced heart failure clinic in [place] VQ scan as outlined below to evaluate for chronic PE -Given his history of DVT and body habitus, he is high risk for thromboembolism  -Recommend CPAP be ordered for use while sleeping while admitted   2.  HTN: -Blood pressure is suboptimally controlled in the 540G systolic upon arrival -BP improved to the 867Y systolic at this time -Escalate IV diuresis along with continuation of metoprolol and lisinopril   3.  Morbid  obesity with OSA and presumed OHS: -Recommend inpatient CPAP while sleeping, defer to primary team -He reports using his CPAP at home approximately 4 days/week that was uncertain how many hours per night as he indicates it is always off his face upon waking up in the morning -Weight loss advised  4.  AKI: -Relatively stable -Renal function appears relatively stable and close to his approximate baseline most recently -Monitor with diuresis  5.  History of DVT: -This occurred in the setting of prolonged travel -Consider VQ scan to evaluate for chronic PE in the setting of his severe pulmonary hypertension (this is the study of choice for chronic PE), this will be deferred to the primary service   For questions or updates, please contact [place] Please consult [email or web address] for contact info under Cardiology/STEMI.    Signed, Christell Faith, PA-C [facility]  Pager: [phone] 12/23/2019, 6:55 AM

## 2019-12-23 NOTE — Plan of Care (Signed)
  Problem: Activity: Goal: Capacity to carry out activities will improve Outcome: Not Progressing  Dyspnea with minimal exertion continues.  Up to recliner to sleep this shift.

## 2019-12-23 NOTE — Plan of Care (Signed)
Nutrition Education Note  RD consulted for nutrition education regarding CHF.  44 y.o. male with history of HFpEF, pulmonary hypertension, COPD, DM 2, remote DVT of the right lower extremity in the setting of prolonged travel, remote tobacco use, HTN, obesity, OSA with approximately 50% CPAP compliance and gout who is admitted with CHF  RD provided "Low Sodium Nutrition Therapy" handout from the Academy of [employer]. Reviewed patient's dietary recall. Provided examples on ways to decrease sodium intake in diet. Discouraged intake of processed foods and use of salt shaker. Encouraged fresh fruits and vegetables as well as whole grain sources of carbohydrates to maximize fiber intake.   RD discussed why it is important for patient to adhere to diet recommendations, and emphasized the role of fluids, foods to avoid, and importance of weighing self daily. Teach back method used.  Expect fair compliance.  Body mass index is 51.67 kg/m. Pt meets criteria for morbid obesity based on current BMI.  Current diet order is CHO modified, patient is consuming approximately 100% of meals at this time. Labs and medications reviewed. No further nutrition interventions warranted at this time. RD contact information provided. If additional nutrition issues arise, please re-consult RD.   Betsey Holiday MS, RD, LDN Contact information available in Amion

## 2019-12-23 NOTE — Progress Notes (Signed)
ReDS Pro reading not appropriate due to BMI > 38.

## 2019-12-23 NOTE — Progress Notes (Signed)
PROGRESS NOTE    Charles Rangel  NGE:[id number] DOB: 1976/01/09 DOA: 12/22/2019 PCP: [facility]    Assessment & Plan:   Active Problems:   Acute on chronic diastolic CHF (congestive heart failure) (HCC)   Gout   HTN (hypertension)   DM (diabetes mellitus) (HCC)   Obesity, Class III, BMI 40-49.9 (morbid obesity) (HCC)    Charles Rangel is a 44 y.o. male with medical history significant for morbid obesity (BMI 51.3 kg/m2), non-insulin-dependent diabetes mellitus, diastolic CHF, history of DVT, obstructive sleep apnea and hypertension presented to the emergency room for evaluation of 3-day history of shortness of breath with exertion treated with 2 pillow orthopnea.   Acute on chronic diastolic CHF Patient presents for evaluation of shortness of breath and is noted to have an elevated BNP.  Chest x-ray showed pulmonary edema Started patient on furosemide 80 mg IV BID --Pt usually takes Bumex at home and noted less urine output the day prior to presentation. --TTE showed LVEF 60-65% PLAN: --continue IV lasix 80 mg BID today Continue lisinopril and metoprolol Check daily weight and strict I/O and maintain a low-sodium diet  Pulmonary hypertension, ruled out CT angiogram showed markedly dilated main pulmonary artery, however, TTE showed no evidence of pulm HTN. --d/c pulmonology consult   Morbid obesity Complicates overall prognosis and care   Hypertension Continue lisinopril and metoprolol On IV lasix    Diabetes mellitus Patient is on Metformin which be placed on hold Will place patient on sliding scale coverage Continue consistent carbohydrate diet   Gout Stable and not in acute exacerbation Continue allopurinol   DVT prophylaxis: Lovenox SQ Code Status: Full code  Family Communication:  Disposition Plan: Home in 1-2 days   Subjective and Interval History:  Pt reported very good urine output in response to IV lasix 80 mg and  dyspnea has improved.  No fever, chest pain, abdominal pain, N/V/D, dysuria, increased swelling.   Objective: Vitals:   12/23/19 0413 12/23/19 0754 12/23/19 1125 12/23/19 1551  BP: (!) 153/92 (!) 142/92 136/88 127/69  Pulse: 80 75 89 85  Resp: 20 18 19 20   Temp: 97.9 F (36.6 C) 98.4 F (36.9 C) 97.6 F (36.4 C) 98.3 F (36.8 C)  TempSrc: Oral Oral Oral Oral  SpO2: 95% 96% 96% 94%  Weight: (!) 145.2 kg     Height:        Intake/Output Summary (Last 24 hours) at 12/23/2019 1746 Last data filed at 12/23/2019 1622 Gross per 24 hour  Intake 1200 ml  Output 4200 ml  Net -3000 ml   Filed Weights   12/22/19 0734 12/22/19 1301 12/23/19 0413  Weight: (!) 144.2 kg (!) 144.9 kg (!) 145.2 kg    Examination:   Constitutional: NAD, AAOx3 HEENT: conjunctivae and lids normal, EOMI CV: RRR no M,R,G. Distal pulses +2.  No cyanosis.   RESP: CTA B/L, normal respiratory effort  GI: +BS, NTND Extremities: No effusions, edema, or tenderness in BLE SKIN: warm, dry and intact Neuro: II - XII grossly intact.  Sensation intact Psych: Normal mood and affect.  Appropriate judgement and reason   Data Reviewed: I have personally reviewed following labs and imaging studies  CBC: Recent Labs  Lab 12/22/19 0755 12/22/19 1110  WBC 6.2 6.4  HGB 15.2 15.5  HCT 48.7 49.1  MCV 82.4 82.2  PLT 263 263   Basic Metabolic Panel: Recent Labs  Lab 12/22/19 0755 12/23/19 0830  NA 144 142  K 3.8 4.9  CL  97* 100  CO2 35* 30  GLUCOSE 114* 113*  BUN 19 22*  CREATININE 1.38* 1.28*  CALCIUM 10.1 10.4*   GFR: Estimated Creatinine Clearance: 100.4 mL/min (A) (by C-G formula based on SCr of 1.28 mg/dL (H)). Liver Function Tests: Recent Labs  Lab 12/22/19 0755  AST 23  ALT 27  ALKPHOS 94  BILITOT 0.8  PROT 7.8  ALBUMIN 4.2   No results for input(s): LIPASE, AMYLASE in the last 168 hours. No results for input(s): AMMONIA in the last 168 hours. Coagulation Profile: No results for input(s):  INR, PROTIME in the last 168 hours. Cardiac Enzymes: No results for input(s): CKTOTAL, CKMB, CKMBINDEX, TROPONINI in the last 168 hours. BNP (last 3 results) No results for input(s): PROBNP in the last 8760 hours. HbA1C: No results for input(s): HGBA1C in the last 72 hours. CBG: Recent Labs  Lab 12/22/19 1625 12/22/19 2016 12/23/19 0755 12/23/19 1127 12/23/19 1704  GLUCAP 117* 145* 101* 156* 123*   Lipid Profile: No results for input(s): CHOL, HDL, LDLCALC, TRIG, CHOLHDL, LDLDIRECT in the last 72 hours. Thyroid Function Tests: No results for input(s): TSH, T4TOTAL, FREET4, T3FREE, THYROIDAB in the last 72 hours. Anemia Panel: No results for input(s): VITAMINB12, FOLATE, FERRITIN, TIBC, IRON, RETICCTPCT in the last 72 hours. Sepsis Labs: No results for input(s): PROCALCITON, LATICACIDVEN in the last 168 hours.  Recent Results (from the past 240 hour(s))  SARS CORONAVIRUS 2 (TAT 6-24 HRS) Nasopharyngeal Nasopharyngeal Swab     Status: None   Collection Time: 12/22/19  9:59 AM   Specimen: Nasopharyngeal Swab  Result Value Ref Range Status   SARS Coronavirus 2 NEGATIVE NEGATIVE Final    Comment: (NOTE) SARS-CoV-2 target nucleic acids are NOT DETECTED. The SARS-CoV-2 RNA is generally detectable in upper and lower respiratory specimens during the acute phase of infection. Negative results do not preclude SARS-CoV-2 infection, do not rule out co-infections with other pathogens, and should not be used as the sole basis for treatment or other patient management decisions. Negative results must be combined with clinical observations, patient history, and epidemiological information. The expected result is Negative. Fact Sheet for Patients: SugarRoll.be Fact Sheet for Healthcare Providers: https://www.woods-mathews.com/ This test is not yet approved or cleared by the [place] FDA and  has been authorized for detection and/or diagnosis  of SARS-CoV-2 by FDA under an Emergency Use Authorization (EUA). This EUA will remain  in effect (meaning this test can be used) for the duration of the COVID-19 declaration under Section 56 4(b)(1) of the Act, 21 U.S.C. section 360bbb-3(b)(1), unless the authorization is terminated or revoked sooner. Performed at [facility], [place] [address] [place], [place] [address]       Radiology Studies: CT Angio Chest PE W and/or Wo Contrast  Result Date: 12/22/2019 CLINICAL DATA:  CHF. COPD. Dyspnea. EXAM: CT ANGIOGRAPHY CHEST WITH CONTRAST TECHNIQUE: Multidetector CT imaging of the chest was performed using the standard protocol during bolus administration of intravenous contrast. Multiplanar CT image reconstructions and MIPs were obtained to evaluate the vascular anatomy. CONTRAST:  82mL OMNIPAQUE IOHEXOL 350 MG/ML SOLN COMPARISON:  08/27/2019 chest CT angiogram. Chest radiograph from earlier today. FINDINGS: Cardiovascular: The study is high quality for the evaluation of pulmonary embolism. There are no filling defects in the central, lobar, segmental or subsegmental pulmonary artery branches to suggest acute pulmonary embolism. Normal course and caliber of the thoracic aorta. Markedly dilated main pulmonary artery (4.1 cm diameter) with diffuse prominence of pulmonary arterial tree bilaterally. Top-normal  heart size. No significant pericardial fluid/thickening. Mediastinum/Nodes: No discrete thyroid nodules. Unremarkable esophagus. No pathologically enlarged axillary, mediastinal or hilar lymph nodes. Lungs/Pleura: No pneumothorax. No pleural effusion. Prominent mosaic attenuation throughout both lungs, unchanged. No central airway stenoses. No acute consolidative airspace disease, lung masses or significant pulmonary nodules. Upper abdomen: Mild diffuse hepatic steatosis. Cholelithiasis. Musculoskeletal: No aggressive appearing focal osseous lesions. Mild thoracic spondylosis. Review of the MIP  images confirms the above findings. IMPRESSION: 1. No acute pulmonary embolism. 2. Markedly dilated main pulmonary artery (4.1 cm diameter) with diffuse prominence of pulmonary arterial tree bilaterally, unchanged, almost certainly indicating severe chronic pulmonary arterial hypertension. 3. Chronic prominent mosaic attenuation throughout both lungs, unchanged, almost certainly due to mosaic perfusion from pulmonary vascular disease. 4. Mild diffuse hepatic steatosis. 5. Cholelithiasis. Electronically Signed   By: Delbert Phenix M.D.   On: 12/22/2019 10:34   DG Chest Port 1 View  Result Date: 12/22/2019 CLINICAL DATA:  Shortness of breath for 2 days EXAM: PORTABLE CHEST 1 VIEW COMPARISON:  08/27/2019 FINDINGS: Cardiomediastinal contours are enlarged and accentuated by portable technique. Vascular congestion and signs of interstitial and airspace opacities with similar appearance to prior examinations. Without dense consolidation or evidence of pleural effusion. Visualized skeletal structures are unremarkable. IMPRESSION: Scattered interstitial and airspace opacities with bilateral relatively symmetric distribution may represent a mixture of edema and or pneumonitis but are without change since November of 2020. Electronically Signed   By: Donzetta Kohut M.D.   On: 12/22/2019 08:32   ECHOCARDIOGRAM COMPLETE  Result Date: 12/22/2019    ECHOCARDIOGRAM REPORT   Patient Name:   GAVINN COLLARD Date of Exam: 12/22/2019 Medical Rec #:  [id number]        Height:       66.0 in Accession #:    [id number]       Weight:       318.0 lb Date of Birth:  March 22, 1976        BSA:          2.435 m Patient Age:    43 years         BP:           136/85 mmHg Patient Gender: M                HR:           83 bpm. Exam Location:  [facility] Procedure: 2D Echo, Cardiac Doppler and Color Doppler Indications:     CHF-acute diastolic 428.31  History:         Patient has prior history of Echocardiogram examinations, most                  recent  11/17/2018. CHF, COPD; Risk Factors:Sleep Apnea,                  Hypertension and Diabetes. DVT.  Sonographer:     Cristela Blue RDCS (AE) Referring Phys:  [id number] Lucile Shutters Diagnosing Phys: Julien Nordmann MD  Sonographer Comments: Technically difficult study due to poor echo windows, no apical window and no subcostal window. IMPRESSIONS  1. Left ventricular ejection fraction, by estimation, is 60 to 65%. The left ventricle has normal function. The left ventricle has no regional wall motion abnormalities. There is moderate left ventricular hypertrophy. Left ventricular diastolic parameters are indeterminate.  2. Right ventricular systolic function is normal. The right ventricular size is normal.  3. Left atrial size was mildly dilated. FINDINGS  Left Ventricle: Left ventricular ejection fraction,  by estimation, is 60 to 65%. The left ventricle has normal function. The left ventricle has no regional wall motion abnormalities. The left ventricular internal cavity size was normal in size. There is  moderate left ventricular hypertrophy. Left ventricular diastolic parameters are indeterminate. Right Ventricle: The right ventricular size is normal. No increase in right ventricular wall thickness. Right ventricular systolic function is normal. Left Atrium: Left atrial size was mildly dilated. Right Atrium: Right atrial size was normal in size. Pericardium: There is no evidence of pericardial effusion. Mitral Valve: The mitral valve is normal in structure and function. Normal mobility of the mitral valve leaflets. No evidence of mitral valve regurgitation. No evidence of mitral valve stenosis. Tricuspid Valve: The tricuspid valve is normal in structure. Tricuspid valve regurgitation is not demonstrated. No evidence of tricuspid stenosis. Aortic Valve: The aortic valve was not well visualized. Aortic valve regurgitation is not visualized. No aortic stenosis is present. Pulmonic Valve: The pulmonic valve was normal in  structure. Pulmonic valve regurgitation is not visualized. No evidence of pulmonic stenosis. Aorta: The aortic root is normal in size and structure. Venous: The inferior vena cava is normal in size with greater than 50% respiratory variability, suggesting right atrial pressure of 3 mmHg. IAS/Shunts: No atrial level shunt detected by color flow Doppler.  LEFT VENTRICLE PLAX 2D LVIDd:         4.23 cm LVIDs:         2.46 cm LV PW:         1.48 cm LV IVS:        1.57 cm LVOT diam:     2.10 cm LVOT Area:     3.46 cm  LEFT ATRIUM         Index LA diam:    4.30 cm 1.77 cm/m                        PULMONIC VALVE AORTA                 PV Vmax:        0.74 m/s Ao Root diam: 3.10 cm PV Peak grad:   2.2 mmHg                       RVOT Peak grad: 4 mmHg   SHUNTS Systemic Diam: 2.10 cm Julien Nordmann MD Electronically signed by Julien Nordmann MD Signature Date/Time: 12/22/2019/5:25:32 PM    Final      Scheduled Meds: . allopurinol  100 mg Oral Daily  . aspirin EC  81 mg Oral Daily  . cholecalciferol  4,000 Units Oral Daily  . colchicine  0.6 mg Oral Daily  . enoxaparin (LOVENOX) injection  40 mg Subcutaneous Q12H  . furosemide  80 mg Intravenous BID  . insulin aspart  0-20 Units Subcutaneous TID WC  . insulin aspart  6 Units Subcutaneous TID WC  . lisinopril  40 mg Oral Daily  . metoprolol succinate  100 mg Oral Daily  . multivitamin with minerals  1 tablet Oral Daily  . potassium chloride  20 mEq Oral BID  . Ensure Max Protein  11 oz Oral BID  . sodium chloride flush  3 mL Intravenous Q12H   Continuous Infusions: . sodium chloride       LOS: 1 day     Darlin Priestly, MD [facility] If 7PM-7AM, please contact night-coverage 12/23/2019, 5:46 PM

## 2019-12-24 LAB — MAGNESIUM: Magnesium: 2.7 mg/dL — ABNORMAL HIGH (ref 1.7–2.4)

## 2019-12-24 LAB — BASIC METABOLIC PANEL
Anion gap: 5 (ref 5–15)
BUN: 31 mg/dL — ABNORMAL HIGH (ref 6–20)
CO2: 36 mmol/L — ABNORMAL HIGH (ref 22–32)
Calcium: 10.3 mg/dL (ref 8.9–10.3)
Chloride: 98 mmol/L (ref 98–111)
Creatinine, Ser: 1.35 mg/dL — ABNORMAL HIGH (ref 0.61–1.24)
GFR calc Af Amer: >60 mL/min (ref >60)
GFR calc non Af Amer: >60 mL/min (ref >60)
Glucose, Bld: 115 mg/dL — ABNORMAL HIGH (ref 70–99)
Potassium: 3.8 mmol/L (ref 3.5–5.1)
Sodium: 139 mmol/L (ref 135–145)

## 2019-12-24 LAB — GLUCOSE, CAPILLARY
Glucose-Capillary: 102 mg/dL — ABNORMAL HIGH (ref 70–99)
Glucose-Capillary: 110 mg/dL — ABNORMAL HIGH (ref 70–99)
Glucose-Capillary: 118 mg/dL — ABNORMAL HIGH (ref 70–99)
Glucose-Capillary: 132 mg/dL — ABNORMAL HIGH (ref 70–99)
Glucose-Capillary: 165 mg/dL — ABNORMAL HIGH (ref 70–99)

## 2019-12-24 LAB — CBC
HCT: 47.4 % (ref 39.0–52.0)
Hemoglobin: 15.1 g/dL (ref 13.0–17.0)
MCH: 25.8 pg — ABNORMAL LOW (ref 26.0–34.0)
MCHC: 31.9 g/dL (ref 30.0–36.0)
MCV: 81 fL (ref 80.0–100.0)
Platelets: 240 10*3/uL (ref 150–400)
RBC: 5.85 MIL/uL — ABNORMAL HIGH (ref 4.22–5.81)
RDW: 17.8 % — ABNORMAL HIGH (ref 11.5–15.5)
WBC: 5.5 10*3/uL (ref 4.0–10.5)
nRBC: 0 % (ref 0.0–0.2)

## 2019-12-24 LAB — HEMOGLOBIN A1C
Hgb A1c MFr Bld: 5.2 % (ref 4.8–5.6)
Mean Plasma Glucose: 103 mg/dL

## 2019-12-24 MED ORDER — FUROSEMIDE 10 MG/ML IJ SOLN
80.0000 mg | Freq: Once | INTRAMUSCULAR | Status: AC
  Administered 2019-12-24: 80 mg via INTRAVENOUS
  Filled 2019-12-24: qty 8

## 2019-12-24 MED ORDER — CARVEDILOL 25 MG PO TABS
25.0000 mg | ORAL_TABLET | Freq: Two times a day (BID) | ORAL | Status: DC
  Administered 2019-12-24 – 2019-12-25 (×3): 25 mg via ORAL
  Filled 2019-12-24 (×3): qty 1

## 2019-12-24 MED ORDER — BUMETANIDE 1 MG PO TABS
3.0000 mg | ORAL_TABLET | Freq: Two times a day (BID) | ORAL | Status: DC
  Administered 2019-12-24: 3 mg via ORAL
  Filled 2019-12-24 (×2): qty 3

## 2019-12-24 MED ORDER — COLCHICINE 0.6 MG PO TABS: 0.3000 mg | ORAL_TABLET | ORAL | Status: DC

## 2019-12-24 NOTE — Progress Notes (Signed)
PROGRESS NOTE    Charles Rangel  HYI:[id number] DOB: 08/09/1976 DOA: 12/22/2019 PCP: [facility] [place]:   Active Problems:   Acute on chronic diastolic CHF (congestive heart failure) (HCC)   Gout   HTN (hypertension)   DM (diabetes mellitus) (HCC)   Obesity, Class III, BMI 40-49.9 (morbid obesity) (HCC)    Charles Rangel is a 44 y.o. male with medical history significant for morbid obesity (BMI 51.3 kg/m2), non-insulin-dependent diabetes mellitus, diastolic CHF, history of DVT, OSA and hypertension presented to the emergency room for evaluation of 3-day history of shortness of breath with exertion treated with 2 pillow orthopnea.   Acute on chronic diastolic CHF Patient presents for evaluation of shortness of breath and is noted to have an elevated BNP.  Chest x-ray showed pulmonary edema Started patient on furosemide 80 mg IV BID --Pt usually takes Bumex at home and noted less urine output the day prior to presentation. --TTE showed LVEF 60-65% PLAN: --continue IV lasix 80 mg BID, per cards Continue lisinopril  --switch metop to coreg, per cards Check daily weight and strict I/O and maintain a low-sodium diet  Suspected Pulmonary hypertension CT angiogram showed markedly dilated main pulmonary artery. --pulmonology consult d/c'ed --RHC before discharge, per cards  OSA on CPAP --about 50% compliance with CPAP --CPAP nightly  Morbid obesity Complicates overall prognosis and care   Hypertension Continue lisinopril and coreg On IV lasix    Diabetes mellitus Patient is on Metformin which be placed on hold Will place patient on sliding scale coverage Continue consistent carbohydrate diet   Gout Stable and not in acute exacerbation Continue allopurinol   DVT prophylaxis: Lovenox SQ Code Status: Full code  Family Communication:  Disposition Plan: Home after RHC   Subjective and Interval History:  Pt had >4L urine  output yesterday, and reported breathing better.  No fever, dyspnea, chest pain, abdominal pain, N/V/D, dysuria, increased swelling.   Objective: Vitals:   12/23/19 1959 12/24/19 0429 12/24/19 0725 12/24/19 1203  BP: (!) 134/95 131/88 (!) 156/92 118/74  Pulse: 87 87 86 83  Resp: 18 20    Temp: 97.8 F (36.6 C) 98.1 F (36.7 C) 98.1 F (36.7 C) 98.1 F (36.7 C)  TempSrc: Oral Oral Oral Oral  SpO2: 95% 97% 96% 98%  Weight:  (!) 144.4 kg    Height:        Intake/Output Summary (Last 24 hours) at 12/24/2019 1327 Last data filed at 12/24/2019 1300 Gross per 24 hour  Intake 1430 ml  Output 2650 ml  Net -1220 ml   Filed Weights   12/22/19 1301 12/23/19 0413 12/24/19 0429  Weight: (!) 144.9 kg (!) 145.2 kg (!) 144.4 kg    Examination:   Constitutional: NAD, AAOx3, sitting in chair HEENT: conjunctivae and lids normal, EOMI CV: RRR no M,R,G. Distal pulses +2.  No cyanosis.   RESP: CTA B/L, normal respiratory effort  GI: +BS, NTND Extremities: some edema in BLE SKIN: warm, dry and intact Neuro: II - XII grossly intact.  Sensation intact Psych: Normal mood and affect.  Appropriate judgement and reason   Data Reviewed: I have personally reviewed following labs and imaging studies  CBC: Recent Labs  Lab 12/22/19 0755 12/22/19 1110 12/24/19 0406  WBC 6.2 6.4 5.5  HGB 15.2 15.5 15.1  HCT 48.7 49.1 47.4  MCV 82.4 82.2 81.0  PLT 263 263 786   Basic Metabolic Panel: Recent Labs  Lab 12/22/19 0755 12/23/19 0830  12/24/19 0406  NA 144 142 139  K 3.8 4.9 3.8  CL 97* 100 98  CO2 35* 30 36*  GLUCOSE 114* 113* 115*  BUN 19 22* 31*  CREATININE 1.38* 1.28* 1.35*  CALCIUM 10.1 10.4* 10.3  MG  --   --  2.7*   GFR: Estimated Creatinine Clearance: 94.8 mL/min (A) (by C-G formula based on SCr of 1.35 mg/dL (H)). Liver Function Tests: Recent Labs  Lab 12/22/19 0755  AST 23  ALT 27  ALKPHOS 94  BILITOT 0.8  PROT 7.8  ALBUMIN 4.2   No results for input(s): LIPASE,  AMYLASE in the last 168 hours. No results for input(s): AMMONIA in the last 168 hours. Coagulation Profile: No results for input(s): INR, PROTIME in the last 168 hours. Cardiac Enzymes: No results for input(s): CKTOTAL, CKMB, CKMBINDEX, TROPONINI in the last 168 hours. BNP (last 3 results) No results for input(s): PROBNP in the last 8760 hours. HbA1C: Recent Labs    12/22/19 1140  HGBA1C 5.2   CBG: Recent Labs  Lab 12/23/19 1704 12/23/19 2148 12/24/19 0007 12/24/19 0727 12/24/19 1204  GLUCAP 123* 100* 118* 102* 110*   Lipid Profile: No results for input(s): CHOL, HDL, LDLCALC, TRIG, CHOLHDL, LDLDIRECT in the last 72 hours. Thyroid Function Tests: No results for input(s): TSH, T4TOTAL, FREET4, T3FREE, THYROIDAB in the last 72 hours. Anemia Panel: No results for input(s): VITAMINB12, FOLATE, FERRITIN, TIBC, IRON, RETICCTPCT in the last 72 hours. Sepsis Labs: No results for input(s): PROCALCITON, LATICACIDVEN in the last 168 hours.  Recent Results (from the past 240 hour(s))  SARS CORONAVIRUS 2 (TAT 6-24 HRS) Nasopharyngeal Nasopharyngeal Swab     Status: None   Collection Time: 12/22/19  9:59 AM   Specimen: Nasopharyngeal Swab  Result Value Ref Range Status   SARS Coronavirus 2 NEGATIVE NEGATIVE Final    Comment: (NOTE) SARS-CoV-2 target nucleic acids are NOT DETECTED. The SARS-CoV-2 RNA is generally detectable in upper and lower respiratory specimens during the acute phase of infection. Negative results do not preclude SARS-CoV-2 infection, do not rule out co-infections with other pathogens, and should not be used as the sole basis for treatment or other patient management decisions. Negative results must be combined with clinical observations, patient history, and epidemiological information. The expected result is Negative. Fact Sheet for Patients: HairSlick.no Fact Sheet for Healthcare  Providers: quierodirigir.com This test is not yet approved or cleared by the [place] FDA and  has been authorized for detection and/or diagnosis of SARS-CoV-2 by FDA under an Emergency Use Authorization (EUA). This EUA will remain  in effect (meaning this test can be used) for the duration of the COVID-19 declaration under Section 56 4(b)(1) of the Act, 21 U.S.C. section 360bbb-3(b)(1), unless the authorization is terminated or revoked sooner. Performed at [facility], [address], [place], [place] [address]       Radiology Studies: ECHOCARDIOGRAM COMPLETE  Result Date: 12/22/2019    ECHOCARDIOGRAM REPORT   Patient Name:   RAJI GLINSKI Date of Exam: 12/22/2019 Medical Rec #:  [id number]        Height:       66.0 in Accession #:    [id number]       Weight:       318.0 lb Date of Birth:  1976/02/19        BSA:          2.435 m Patient Age:    43 years  BP:           136/85 mmHg Patient Gender: M                HR:           83 bpm. Exam Location:  [facility] Procedure: 2D Echo, Cardiac Doppler and Color Doppler Indications:     CHF-acute diastolic 428.31  History:         Patient has prior history of Echocardiogram examinations, most                  recent 11/17/2018. CHF, COPD; Risk Factors:Sleep Apnea,                  Hypertension and Diabetes. DVT.  Sonographer:     Cristela Blue RDCS (AE) Referring Phys:  [id number] Lucile Shutters Diagnosing Phys: Julien Nordmann MD  Sonographer Comments: Technically difficult study due to poor echo windows, no apical window and no subcostal window. IMPRESSIONS  1. Left ventricular ejection fraction, by estimation, is 60 to 65%. The left ventricle has normal function. The left ventricle has no regional wall motion abnormalities. There is moderate left ventricular hypertrophy. Left ventricular diastolic parameters are indeterminate.  2. Right ventricular systolic function is normal. The right ventricular size is normal.  3. Left  atrial size was mildly dilated. FINDINGS  Left Ventricle: Left ventricular ejection fraction, by estimation, is 60 to 65%. The left ventricle has normal function. The left ventricle has no regional wall motion abnormalities. The left ventricular internal cavity size was normal in size. There is  moderate left ventricular hypertrophy. Left ventricular diastolic parameters are indeterminate. Right Ventricle: The right ventricular size is normal. No increase in right ventricular wall thickness. Right ventricular systolic function is normal. Left Atrium: Left atrial size was mildly dilated. Right Atrium: Right atrial size was normal in size. Pericardium: There is no evidence of pericardial effusion. Mitral Valve: The mitral valve is normal in structure and function. Normal mobility of the mitral valve leaflets. No evidence of mitral valve regurgitation. No evidence of mitral valve stenosis. Tricuspid Valve: The tricuspid valve is normal in structure. Tricuspid valve regurgitation is not demonstrated. No evidence of tricuspid stenosis. Aortic Valve: The aortic valve was not well visualized. Aortic valve regurgitation is not visualized. No aortic stenosis is present. Pulmonic Valve: The pulmonic valve was normal in structure. Pulmonic valve regurgitation is not visualized. No evidence of pulmonic stenosis. Aorta: The aortic root is normal in size and structure. Venous: The inferior vena cava is normal in size with greater than 50% respiratory variability, suggesting right atrial pressure of 3 mmHg. IAS/Shunts: No atrial level shunt detected by color flow Doppler.  LEFT VENTRICLE PLAX 2D LVIDd:         4.23 cm LVIDs:         2.46 cm LV PW:         1.48 cm LV IVS:        1.57 cm LVOT diam:     2.10 cm LVOT Area:     3.46 cm  LEFT ATRIUM         Index LA diam:    4.30 cm 1.77 cm/m                        PULMONIC VALVE AORTA                 PV Vmax:        0.74 m/s Ao  Root diam: 3.10 cm PV Peak grad:   2.2 mmHg                        RVOT Peak grad: 4 mmHg   SHUNTS Systemic Diam: 2.10 cm Julien Nordmann MD Electronically signed by Julien Nordmann MD Signature Date/Time: 12/22/2019/5:25:32 PM    Final      Scheduled Meds: . allopurinol  100 mg Oral Daily  . aspirin EC  81 mg Oral Daily  . bumetanide  3 mg Oral BID  . carvedilol  25 mg Oral BID WC  . cholecalciferol  4,000 Units Oral Daily  . colchicine  0.6 mg Oral Daily  . enoxaparin (LOVENOX) injection  40 mg Subcutaneous Q12H  . insulin aspart  0-20 Units Subcutaneous TID WC  . insulin aspart  6 Units Subcutaneous TID WC  . lisinopril  40 mg Oral Daily  . multivitamin with minerals  1 tablet Oral Daily  . potassium chloride  20 mEq Oral BID  . Ensure Max Protein  11 oz Oral BID  . sodium chloride flush  3 mL Intravenous Q12H   Continuous Infusions: . sodium chloride       LOS: 2 days     Darlin Priestly, MD [facility] If 7PM-7AM, please contact night-coverage 12/24/2019, 1:27 PM

## 2019-12-24 NOTE — Progress Notes (Signed)
Progress Note  Patient Name: Charles Rangel Date of Encounter: 12/24/2019  Primary Cardiologist: Kirke Corin  Subjective   Net -4.8 L since admission.  Creatinine remained stable.  SOB perhaps slightly better.  Slept in a chair overnight last night.  Does feel he would be able to lay flat, but not for the entirety of the night.  Inpatient Medications    Scheduled Meds: . allopurinol  100 mg Oral Daily  . aspirin EC  81 mg Oral Daily  . bumetanide  3 mg Oral BID  . cholecalciferol  4,000 Units Oral Daily  . colchicine  0.6 mg Oral Daily  . enoxaparin (LOVENOX) injection  40 mg Subcutaneous Q12H  . insulin aspart  0-20 Units Subcutaneous TID WC  . insulin aspart  6 Units Subcutaneous TID WC  . lisinopril  40 mg Oral Daily  . metoprolol succinate  100 mg Oral Daily  . multivitamin with minerals  1 tablet Oral Daily  . potassium chloride  20 mEq Oral BID  . Ensure Max Protein  11 oz Oral BID  . sodium chloride flush  3 mL Intravenous Q12H   Continuous Infusions: . sodium chloride     PRN Meds: sodium chloride, ondansetron (ZOFRAN) IV, sodium chloride flush   Vital Signs    Vitals:   12/23/19 1959 12/24/19 0429 12/24/19 0725 12/24/19 1203  BP: (!) 134/95 131/88 (!) 156/92 118/74  Pulse: 87 87 86 83  Resp: 18 20    Temp: 97.8 F (36.6 C) 98.1 F (36.7 C) 98.1 F (36.7 C) 98.1 F (36.7 C)  TempSrc: Oral Oral Oral Oral  SpO2: 95% 97% 96% 98%  Weight:  (!) 144.4 kg    Height:        Intake/Output Summary (Last 24 hours) at 12/24/2019 1241 Last data filed at 12/24/2019 0725 Gross per 24 hour  Intake 950 ml  Output 4650 ml  Net -3700 ml   Filed Weights   12/22/19 1301 12/23/19 0413 12/24/19 0429  Weight: (!) 144.9 kg (!) 145.2 kg (!) 144.4 kg    Telemetry    SR - Personally Reviewed  ECG    No new tracings - Personally Reviewed  Physical Exam   GEN: Well nourished, well developed, in no acute distress  HEENT: normal  Neck: no JVD, carotid bruits, or  masses Cardiac: RRR; no murmurs, rubs, or gallops,no edema  Respiratory:  clear to auscultation bilaterally, normal work of breathing GI: soft, nontender, nondistended, + BS MS: no deformity or atrophy  Skin: warm and dry Neuro:  Strength and sensation are intact Psych: euthymic mood, full affect  Labs    Chemistry Recent Labs  Lab 12/22/19 0755 12/23/19 0830 12/24/19 0406  NA 144 142 139  K 3.8 4.9 3.8  CL 97* 100 98  CO2 35* 30 36*  GLUCOSE 114* 113* 115*  BUN 19 22* 31*  CREATININE 1.38* 1.28* 1.35*  CALCIUM 10.1 10.4* 10.3  PROT 7.8  --   --   ALBUMIN 4.2  --   --   AST 23  --   --   ALT 27  --   --   ALKPHOS 94  --   --   BILITOT 0.8  --   --   GFRNONAA >60 >60 >60  GFRAA >60 >60 >60  ANIONGAP 12 12 5      Hematology Recent Labs  Lab 12/22/19 0755 12/22/19 1110 12/24/19 0406  WBC 6.2 6.4 5.5  RBC 5.91* 5.97* 5.85*  HGB 15.2 15.5 15.1  HCT 48.7 49.1 47.4  MCV 82.4 82.2 81.0  MCH 25.7* 26.0 25.8*  MCHC 31.2 31.6 31.9  RDW 18.2* 18.5* 17.8*  PLT 263 263 240    Cardiac EnzymesNo results for input(s): TROPONINI in the last 168 hours. No results for input(s): TROPIPOC in the last 168 hours.   BNP Recent Labs  Lab 12/22/19 0755  BNP 232.0*     DDimer No results for input(s): DDIMER in the last 168 hours.   Radiology    CT Angio Chest PE W and/or Wo Contrast  Result Date: 12/22/2019 IMPRESSION: 1. No acute pulmonary embolism. 2. Markedly dilated main pulmonary artery (4.1 cm diameter) with diffuse prominence of pulmonary arterial tree bilaterally, unchanged, almost certainly indicating severe chronic pulmonary arterial hypertension. 3. Chronic prominent mosaic attenuation throughout both lungs, unchanged, almost certainly due to mosaic perfusion from pulmonary vascular disease. 4. Mild diffuse hepatic steatosis. 5. Cholelithiasis. Electronically Signed   By: Ilona Sorrel M.D.   On: 12/22/2019 10:34   DG Chest Port 1 View  Result Date:  12/22/2019 IMPRESSION: Scattered interstitial and airspace opacities with bilateral relatively symmetric distribution may represent a mixture of edema and or pneumonitis but are without change since November of 2020. Electronically Signed   By: Zetta Bills M.D.   On: 12/22/2019 08:32    Cardiac Studies   2D echo 12/22/2019: 1. Left ventricular ejection fraction, by estimation, is 60 to 65%. The  left ventricle has normal function. The left ventricle has no regional  wall motion abnormalities. There is moderate left ventricular hypertrophy.  Left ventricular diastolic parameters are indeterminate.  2. Right ventricular systolic function is normal. The right ventricular  size is normal.  3. Left atrial size was mildly dilated.   Patient Profile     44 y.o. male with history of HFpEF, pulmonary hypertension, COPD, DM 2, remote DVT of the right lower extremity in the setting of prolonged travel, remote tobacco use, HTN, obesity, OSA with approximately 50% CPAP compliance, and gout who is being seen today for the evaluation of CHF.  Assessment & Plan    1. Acute on chronic diastolic CHF/severe pulmonary hypertension: CTA suggests severe pulmonary hypertension.  He remains volume overloaded.  Continue Lasix 80 mg twice daily.  His creatinine has plateaued and is now gone up mildly.  We Analysia Dungee continue with current diuresis and potentially need to adjust tomorrow.  Alexzia Kasler likely need right heart catheterization to further define hemodynamics prior to discharge.   2.  HTN: Blood pressure remains elevated.  Haskell Rihn switch from metoprolol to carvedilol as this may improve his overall control.  3.  Morbid obesity with OSA and presumed OHS: CPAP recommended while sleeping.  Weight loss advised.  4.  AKI: Relatively stable since admission.  Has actually come down mildly.  We Dagmar Adcox continue to monitor with diuresis.-Relatively stable   5.  History of DVT: Occurred in the setting of prolonged  travel.  Would consider VQ scan for the the possibility of chronic PE.  For questions or updates, please contact [place] Please consult [email or web address] for contact info under Cardiology/STEMI.    Signed, Christell Faith, PA-C [place] Pager: [phone] 12/24/2019, 12:41 PM

## 2019-12-25 LAB — CBC
HCT: 52.2 % — ABNORMAL HIGH (ref 39.0–52.0)
Hemoglobin: 16.6 g/dL (ref 13.0–17.0)
MCH: 25.9 pg — ABNORMAL LOW (ref 26.0–34.0)
MCHC: 31.8 g/dL (ref 30.0–36.0)
MCV: 81.6 fL (ref 80.0–100.0)
Platelets: 235 10*3/uL (ref 150–400)
RBC: 6.4 MIL/uL — ABNORMAL HIGH (ref 4.22–5.81)
RDW: 18.4 % — ABNORMAL HIGH (ref 11.5–15.5)
WBC: 4.6 10*3/uL (ref 4.0–10.5)
nRBC: 0.4 % — ABNORMAL HIGH (ref 0.0–0.2)

## 2019-12-25 LAB — MAGNESIUM: Magnesium: 2.9 mg/dL — ABNORMAL HIGH (ref 1.7–2.4)

## 2019-12-25 LAB — BASIC METABOLIC PANEL
Anion gap: 16 — ABNORMAL HIGH (ref 5–15)
BUN: 49 mg/dL — ABNORMAL HIGH (ref 6–20)
CO2: 29 mmol/L (ref 22–32)
Calcium: 10.2 mg/dL (ref 8.9–10.3)
Chloride: 94 mmol/L — ABNORMAL LOW (ref 98–111)
Creatinine, Ser: 1.53 mg/dL — ABNORMAL HIGH (ref 0.61–1.24)
GFR calc Af Amer: >60 mL/min (ref >60)
GFR calc non Af Amer: 54 mL/min — ABNORMAL LOW (ref >60)
Glucose, Bld: 139 mg/dL — ABNORMAL HIGH (ref 70–99)
Potassium: 4.4 mmol/L (ref 3.5–5.1)
Sodium: 139 mmol/L (ref 135–145)

## 2019-12-25 LAB — GLUCOSE, CAPILLARY
Glucose-Capillary: 128 mg/dL — ABNORMAL HIGH (ref 70–99)
Glucose-Capillary: 139 mg/dL — ABNORMAL HIGH (ref 70–99)
Glucose-Capillary: 105 mg/dL — ABNORMAL HIGH (ref 70–99)

## 2019-12-25 MED ORDER — CARVEDILOL 25 MG PO TABS: 25.0000 mg | ORAL_TABLET | Freq: Two times a day (BID) | ORAL | 2 refills | Status: DC

## 2019-12-25 MED ORDER — FUROSEMIDE 80 MG PO TABS: 80.0000 mg | ORAL_TABLET | Freq: Two times a day (BID) | ORAL | 2 refills | Status: DC

## 2019-12-25 MED ORDER — COLCHICINE 0.6 MG PO TABS: 0.3000 mg | ORAL_TABLET | ORAL | 0 refills | Status: DC

## 2019-12-25 NOTE — Progress Notes (Signed)
Progress Note  Patient Name: Charles Rangel Date of Encounter: 12/25/2019  Primary Cardiologist: Fletcher Anon  Subjective   Net negative 6.5 L since admission.  Patient states that he is feeling well without any breathing issues.  He does want to get out of the hospital, though there was a plan for right heart catheterization to fully evaluate his pulmonary pressures.  He does state that he has a cardiologist at the [place] who could assist with that.  Inpatient Medications    Scheduled Meds: . allopurinol  100 mg Oral Daily  . aspirin EC  81 mg Oral Daily  . carvedilol  25 mg Oral BID WC  . cholecalciferol  4,000 Units Oral Daily  . [START ON 12/26/2019] colchicine  0.3 mg Oral QODAY  . enoxaparin (LOVENOX) injection  40 mg Subcutaneous Q12H  . insulin aspart  0-20 Units Subcutaneous TID WC  . insulin aspart  6 Units Subcutaneous TID WC  . lisinopril  40 mg Oral Daily  . multivitamin with minerals  1 tablet Oral Daily  . potassium chloride  20 mEq Oral BID  . Ensure Max Protein  11 oz Oral BID  . sodium chloride flush  3 mL Intravenous Q12H   Continuous Infusions: . sodium chloride     PRN Meds: sodium chloride, ondansetron (ZOFRAN) IV, sodium chloride flush   Vital Signs    Vitals:   12/24/19 1941 12/25/19 0420 12/25/19 0737 12/25/19 1154  BP: 95/62 103/67 108/61 104/65  Pulse: 93 84 75 79  Resp: 20 20 18 18   Temp: 98.2 F (36.8 C) 98.2 F (36.8 C) 98.2 F (36.8 C) 98 F (36.7 C)  TempSrc: Oral Oral    SpO2: 98% 97% 96%   Weight:  (!) 143.8 kg  (!) 144.1 kg  Height:        Intake/Output Summary (Last 24 hours) at 12/25/2019 1250 Last data filed at 12/25/2019 0422 Gross per 24 hour  Intake 1400 ml  Output 3375 ml  Net -1975 ml   Filed Weights   12/24/19 0429 12/25/19 0420 12/25/19 1154  Weight: (!) 144.4 kg (!) 143.8 kg (!) 144.1 kg    Telemetry    Sinus rhythm-personally reviewed  ECG    No new tracings - Personally Reviewed  Physical Exam   GEN: Well  nourished, well developed, in no acute distress  HEENT: normal  Neck: no JVD, carotid bruits, or masses Cardiac: RRR; no murmurs, rubs, or gallops, 1+ woody edema  Respiratory:  clear to auscultation bilaterally, normal work of breathing GI: soft, nontender, nondistended, + BS MS: no deformity or atrophy  Skin: warm and dry Neuro:  Strength and sensation are intact Psych: euthymic mood, full affect   Labs    Chemistry Recent Labs  Lab 12/22/19 0755 12/22/19 0755 12/23/19 0830 12/24/19 0406 12/25/19 0949  NA 144   < > 142 139 139  K 3.8   < > 4.9 3.8 4.4  CL 97*   < > 100 98 94*  CO2 35*   < > 30 36* 29  GLUCOSE 114*   < > 113* 115* 139*  BUN 19   < > 22* 31* 49*  CREATININE 1.38*   < > 1.28* 1.35* 1.53*  CALCIUM 10.1   < > 10.4* 10.3 10.2  PROT 7.8  --   --   --   --   ALBUMIN 4.2  --   --   --   --   AST 23  --   --   --   --  ALT 27  --   --   --   --   ALKPHOS 94  --   --   --   --   BILITOT 0.8  --   --   --   --   GFRNONAA >60   < > >60 >60 54*  GFRAA >60   < > >60 >60 >60  ANIONGAP 12   < > 12 5 16*   < > = values in this interval not displayed.     Hematology Recent Labs  Lab 12/22/19 1110 12/24/19 0406 12/25/19 0949  WBC 6.4 5.5 4.6  RBC 5.97* 5.85* 6.40*  HGB 15.5 15.1 16.6  HCT 49.1 47.4 52.2*  MCV 82.2 81.0 81.6  MCH 26.0 25.8* 25.9*  MCHC 31.6 31.9 31.8  RDW 18.5* 17.8* 18.4*  PLT 263 240 235    Cardiac EnzymesNo results for input(s): TROPONINI in the last 168 hours. No results for input(s): TROPIPOC in the last 168 hours.   BNP Recent Labs  Lab 12/22/19 0755  BNP 232.0*     DDimer No results for input(s): DDIMER in the last 168 hours.   Radiology    CT Angio Chest PE W and/or Wo Contrast  Result Date: 12/22/2019 IMPRESSION: 1. No acute pulmonary embolism. 2. Markedly dilated main pulmonary artery (4.1 cm diameter) with diffuse prominence of pulmonary arterial tree bilaterally, unchanged, almost certainly indicating severe chronic  pulmonary arterial hypertension. 3. Chronic prominent mosaic attenuation throughout both lungs, unchanged, almost certainly due to mosaic perfusion from pulmonary vascular disease. 4. Mild diffuse hepatic steatosis. 5. Cholelithiasis. Electronically Signed   By: Delbert Phenix M.D.   On: 12/22/2019 10:34   DG Chest Port 1 View  Result Date: 12/22/2019 IMPRESSION: Scattered interstitial and airspace opacities with bilateral relatively symmetric distribution may represent a mixture of edema and or pneumonitis but are without change since November of 2020. Electronically Signed   By: Donzetta Kohut M.D.   On: 12/22/2019 08:32    Cardiac Studies   2D echo 12/22/2019: 1. Left ventricular ejection fraction, by estimation, is 60 to 65%. The  left ventricle has normal function. The left ventricle has no regional  wall motion abnormalities. There is moderate left ventricular hypertrophy.  Left ventricular diastolic parameters are indeterminate.  2. Right ventricular systolic function is normal. The right ventricular  size is normal.  3. Left atrial size was mildly dilated.   Patient Profile     44 y.o. male with history of HFpEF, pulmonary hypertension, COPD, DM 2, remote DVT of the right lower extremity in the setting of prolonged travel, remote tobacco use, HTN, obesity, OSA with approximately 50% CPAP compliance, and gout who is being seen today for the evaluation of CHF.  Assessment & Plan    1. Acute on chronic diastolic CHF/severe pulmonary hypertension: CTA suggest severe pulmonary hypertension.  He remains mildly volume overloaded.  He has been getting IV Lasix.  His creatinine has been mildly increased, though he does have still fluid left to be diuresed.  We Charles Rangel continue with Lasix 80 mg twice a day.  Should he continue to wish to be discharged today, this would be possible with 80 mg p.o. twice a day.  He Charles Rangel need close cardiology follow-up.  If he does not get discharge, likely Charles Rangel be  amenable to right heart catheterization tomorrow.  Discharge planning per primary team.  2.  HTN: Blood pressure improved on carvedilol.  3.  Morbid obesity with OSA and presumed  OHS: CPAP while sleeping.  4.  AKI: Relatively stable since admission.     For questions or updates, please contact [facility] Please consult [email or web address] for contact info under Cardiology/STEMI.    Signed, Eula Listen, PA-C [facility] Pager: [phone] 12/25/2019, 12:50 PM

## 2019-12-25 NOTE — Discharge Summary (Signed)
Physician Discharge Summary   Manley Fason  male DOB: Sep 02, 1976  HEN:[id number]  PCP: [facility]  Admit date: 12/22/2019 Discharge date: 12/25/2019  Admitted From: home Disposition:  home CODE STATUS: Full code  Discharge Instructions    AMB referral to CHF clinic   Complete by: As directed    AMB referral to pulmonary rehabilitation   Complete by: As directed    Please select a program: Respiratory Care Services   Respiratory Care Services Diagnosis: Heart Failure   After initial evaluation and assessments completed: Virtual Based Care may be provided alone or in conjunction with Pulmonary Rehab/Respiratory Care services based on patient barriers.: Yes   Diet - low sodium heart healthy   Complete by: As directed    Discharge instructions   Complete by: As directed    You were seen by cardiology during your hospitalization.  Your water pill has been changed to Lasix 80 mg twice a day from your old Bumex.  Also please take Coreg 25 mg twice a day instead of your old Toprol.  Since you don't want to stay inpatient for the right heart cath as recommended by our cardiologist, please follow up with your own outpatient cardiologist for a right heart cath to evaluate for pulmonary hypertension as soon as possible.    Please also follow up with either your cardiologist or PCP 1 week after discharge to monitor for your kidney function after switching water pills.   Dr. Darlin Priestly - -   Increase activity slowly   Complete by: As directed        Hospital Course:  For full details, please see H&P, progress notes, consult notes and ancillary notes.  Briefly,  Charles Gilliamis a 44 y.o.malewith medical history significant formorbid obesity(BMI 51.3 kg/m2),non-insulin-dependent diabetes mellitus,diastolic CHF,history of DVT,OSA and hypertension presented to the emergency room for evaluation of 3-day history of shortness of breath with exertion and 2pillow  orthopnea.   Acute on chronic diastolic CHF Pt was not hypoxic.  Chest x-ray showed pulmonary edema.  Pt usually takes Bumex at home and noted less urine output the day prior to presentation.  TTE showed LVEF 60-65%.  Patient was started on furosemide 80 mg IV BID with good urine output and improvement in pt's dyspnea.  Pt was continued on home lisinopril and switched home metop to coreg for better BP management, per cards.  Pt was discharged on Lasix 80 mg BID, per cards.  Suspected Pulmonary hypertension CT angiogram showed markedly dilated main pulmonary artery.  Cardiology had recommended inpatient right heart cath before discharge, however, pt elected to follow up with his outpatient cardiologist for the right heart cath.  Pt was not hypoxic, so was deemed stable for discharge without outpatient followup.  OSA on CPAP Pt reported about 50% compliance with CPAP.  Continued on CPAP nightly.  Morbid obesity  Hypertension Continued lisinopril, coreg and Lasix.  Diabetes mellitus Home Metformin held during hospitalization and resumed at discharge.  Gout Stable and not in acute exacerbation. Continued allopurinol.   Discharge Diagnoses:  Active Problems:   Acute on chronic diastolic CHF (congestive heart failure) (HCC)   Gout   HTN (hypertension)   DM (diabetes mellitus) (HCC)   Obesity, Class III, BMI 40-49.9 (morbid obesity) (HCC)    Discharge Instructions:  Allergies as of 12/25/2019      Reactions   Tylenol [acetaminophen] Anaphylaxis   Dog Epithelium Allergy Skin Test Hives   Apple Hives   Other Hives  RAISINS      Medication List    STOP taking these medications   bumetanide 2 MG tablet Commonly known as: BUMEX   metoprolol succinate 100 MG 24 hr tablet Commonly known as: TOPROL-XL     TAKE these medications   albuterol 108 (90 Base) MCG/ACT inhaler Commonly known as: VENTOLIN HFA Inhale 2 puffs into the lungs every 6 (six) hours as needed for  wheezing or shortness of breath.   allopurinol 100 MG tablet Commonly known as: ZYLOPRIM Take 1 tablet (100 mg total) by mouth daily.   aspirin EC 81 MG tablet Take 81 mg daily by mouth.   carvedilol 25 MG tablet Commonly known as: COREG Take 1 tablet (25 mg total) by mouth 2 (two) times daily with a meal.   colchicine 0.6 MG tablet Take 0.5 tablets (0.3 mg total) by mouth every other day. What changed:   how much to take  when to take this  additional instructions   furosemide 80 MG tablet Commonly known as: Lasix Take 1 tablet (80 mg total) by mouth 2 (two) times daily. Your new water pills.   lisinopril 40 MG tablet Commonly known as: ZESTRIL Take 1 tablet (40 mg total) by mouth daily for 30 doses.   metFORMIN 500 MG tablet Commonly known as: GLUCOPHAGE Take 1,000 mg by mouth 2 (two) times daily with a meal.   multivitamin with minerals Tabs tablet Take 1 tablet by mouth daily.   Vitamin D3 25 MCG (1000 UT) Caps Take 4 capsules by mouth daily.       Follow-up Information    [facility] Follow up on 12/30/2019.   Specialty: Cardiology Why: at 10:00am. Enter through the Medical Mall entrance Contact information: [address] [place] [address] [phone]       [facility]. Schedule an appointment as soon as possible for a visit in 1 week(s).   Specialty: General Practice Contact information: [address] [place] [address] [phone]        Iran Ouch, MD .   Specialty: Cardiology Contact information: [address] [place] [address] [phone]           Allergies  Allergen Reactions  . Tylenol [Acetaminophen] Anaphylaxis  . Dog Epithelium Allergy Skin Test Hives  . Apple Hives  . Other Hives    RAISINS     The results of significant diagnostics from this hospitalization (including imaging, microbiology, ancillary  and laboratory) are listed below for reference.   Consultations:   Procedures/Studies: CT Angio Chest PE W and/or Wo Contrast  Result Date: 12/22/2019 CLINICAL DATA:  CHF. COPD. Dyspnea. EXAM: CT ANGIOGRAPHY CHEST WITH CONTRAST TECHNIQUE: Multidetector CT imaging of the chest was performed using the standard protocol during bolus administration of intravenous contrast. Multiplanar CT image reconstructions and MIPs were obtained to evaluate the vascular anatomy. CONTRAST:  80mL OMNIPAQUE IOHEXOL 350 MG/ML SOLN COMPARISON:  08/27/2019 chest CT angiogram. Chest radiograph from earlier today. FINDINGS: Cardiovascular: The study is high quality for the evaluation of pulmonary embolism. There are no filling defects in the central, lobar, segmental or subsegmental pulmonary artery branches to suggest acute pulmonary embolism. Normal course and caliber of the thoracic aorta. Markedly dilated main pulmonary artery (4.1 cm diameter) with diffuse prominence of pulmonary arterial tree bilaterally. Top-normal heart size. No significant pericardial fluid/thickening. Mediastinum/Nodes: No discrete thyroid nodules. Unremarkable esophagus. No pathologically enlarged axillary, mediastinal or hilar  lymph nodes. Lungs/Pleura: No pneumothorax. No pleural effusion. Prominent mosaic attenuation throughout both lungs, unchanged. No central airway stenoses. No acute consolidative airspace disease, lung masses or significant pulmonary nodules. Upper abdomen: Mild diffuse hepatic steatosis. Cholelithiasis. Musculoskeletal: No aggressive appearing focal osseous lesions. Mild thoracic spondylosis. Review of the MIP images confirms the above findings. IMPRESSION: 1. No acute pulmonary embolism. 2. Markedly dilated main pulmonary artery (4.1 cm diameter) with diffuse prominence of pulmonary arterial tree bilaterally, unchanged, almost certainly indicating severe chronic pulmonary arterial hypertension. 3. Chronic prominent mosaic  attenuation throughout both lungs, unchanged, almost certainly due to mosaic perfusion from pulmonary vascular disease. 4. Mild diffuse hepatic steatosis. 5. Cholelithiasis. Electronically Signed   By: Ilona Sorrel M.D.   On: 12/22/2019 10:34   DG Chest Port 1 View  Result Date: 12/22/2019 CLINICAL DATA:  Shortness of breath for 2 days EXAM: PORTABLE CHEST 1 VIEW COMPARISON:  08/27/2019 FINDINGS: Cardiomediastinal contours are enlarged and accentuated by portable technique. Vascular congestion and signs of interstitial and airspace opacities with similar appearance to prior examinations. Without dense consolidation or evidence of pleural effusion. Visualized skeletal structures are unremarkable. IMPRESSION: Scattered interstitial and airspace opacities with bilateral relatively symmetric distribution may represent a mixture of edema and or pneumonitis but are without change since November of 2020. Electronically Signed   By: Zetta Bills M.D.   On: 12/22/2019 08:32   ECHOCARDIOGRAM COMPLETE  Result Date: 12/22/2019    ECHOCARDIOGRAM REPORT   Patient Name:   Charles Rangel Date of Exam: 12/22/2019 Medical Rec #:  [id number]        Height:       66.0 in Accession #:    [id number]       Weight:       318.0 lb Date of Birth:  [birth year]        BSA:          2.435 m Patient Age:    89 years         BP:           136/85 mmHg Patient Gender: M                HR:           83 bpm. Exam Location:  [facility] Procedure: 2D Echo, Cardiac Doppler and Color Doppler Indications:     CHF-acute diastolic 315.40  History:         Patient has prior history of Echocardiogram examinations, most                  recent 11/17/2018. CHF, COPD; Risk Factors:Sleep Apnea,                  Hypertension and Diabetes. DVT.  Sonographer:     Sherrie Sport RDCS (AE) Referring Phys:  [id number] Collier Bullock Diagnosing Phys: Ida Rogue MD  Sonographer Comments: Technically difficult study due to poor echo windows, no apical window and no subcostal  window. IMPRESSIONS  1. Left ventricular ejection fraction, by estimation, is 60 to 65%. The left ventricle has normal function. The left ventricle has no regional wall motion abnormalities. There is moderate left ventricular hypertrophy. Left ventricular diastolic parameters are indeterminate.  2. Right ventricular systolic function is normal. The right ventricular size is normal.  3. Left atrial size was mildly dilated. FINDINGS  Left Ventricle: Left ventricular ejection fraction, by estimation, is 60 to 65%. The left ventricle has normal function. The left ventricle has no regional wall motion  abnormalities. The left ventricular internal cavity size was normal in size. There is  moderate left ventricular hypertrophy. Left ventricular diastolic parameters are indeterminate. Right Ventricle: The right ventricular size is normal. No increase in right ventricular wall thickness. Right ventricular systolic function is normal. Left Atrium: Left atrial size was mildly dilated. Right Atrium: Right atrial size was normal in size. Pericardium: There is no evidence of pericardial effusion. Mitral Valve: The mitral valve is normal in structure and function. Normal mobility of the mitral valve leaflets. No evidence of mitral valve regurgitation. No evidence of mitral valve stenosis. Tricuspid Valve: The tricuspid valve is normal in structure. Tricuspid valve regurgitation is not demonstrated. No evidence of tricuspid stenosis. Aortic Valve: The aortic valve was not well visualized. Aortic valve regurgitation is not visualized. No aortic stenosis is present. Pulmonic Valve: The pulmonic valve was normal in structure. Pulmonic valve regurgitation is not visualized. No evidence of pulmonic stenosis. Aorta: The aortic root is normal in size and structure. Venous: The inferior vena cava is normal in size with greater than 50% respiratory variability, suggesting right atrial pressure of 3 mmHg. IAS/Shunts: No atrial level shunt  detected by color flow Doppler.  LEFT VENTRICLE PLAX 2D LVIDd:         4.23 cm LVIDs:         2.46 cm LV PW:         1.48 cm LV IVS:        1.57 cm LVOT diam:     2.10 cm LVOT Area:     3.46 cm  LEFT ATRIUM         Index LA diam:    4.30 cm 1.77 cm/m                        PULMONIC VALVE AORTA                 PV Vmax:        0.74 m/s Ao Root diam: 3.10 cm PV Peak grad:   2.2 mmHg                       RVOT Peak grad: 4 mmHg   SHUNTS Systemic Diam: 2.10 cm Julien Nordmann MD Electronically signed by Julien Nordmann MD Signature Date/Time: 12/22/2019/5:25:32 PM    Final       Labs: BNP (last 3 results) Recent Labs    12/22/19 0755  BNP 232.0*   Basic Metabolic Panel: Recent Labs  Lab 12/22/19 0755 12/23/19 0830 12/24/19 0406 12/25/19 0949  NA 144 142 139 139  K 3.8 4.9 3.8 4.4  CL 97* 100 98 94*  CO2 35* 30 36* 29  GLUCOSE 114* 113* 115* 139*  BUN 19 22* 31* 49*  CREATININE 1.38* 1.28* 1.35* 1.53*  CALCIUM 10.1 10.4* 10.3 10.2  MG  --   --  2.7* 2.9*   Liver Function Tests: Recent Labs  Lab 12/22/19 0755  AST 23  ALT 27  ALKPHOS 94  BILITOT 0.8  PROT 7.8  ALBUMIN 4.2   No results for input(s): LIPASE, AMYLASE in the last 168 hours. No results for input(s): AMMONIA in the last 168 hours. CBC: Recent Labs  Lab 12/22/19 0755 12/22/19 1110 12/24/19 0406 12/25/19 0949  WBC 6.2 6.4 5.5 4.6  HGB 15.2 15.5 15.1 16.6  HCT 48.7 49.1 47.4 52.2*  MCV 82.4 82.2 81.0 81.6  PLT 263 263 240 235   Cardiac  Enzymes: No results for input(s): CKTOTAL, CKMB, CKMBINDEX, TROPONINI in the last 168 hours. BNP: Invalid input(s): POCBNP CBG: Recent Labs  Lab 12/24/19 1611 12/24/19 2041 12/25/19 0736 12/25/19 1155 12/25/19 1651  GLUCAP 132* 165* 128* 139* 105*   D-Dimer No results for input(s): DDIMER in the last 72 hours. Hgb A1c No results for input(s): HGBA1C in the last 72 hours. Lipid Profile No results for input(s): CHOL, HDL, LDLCALC, TRIG, CHOLHDL, LDLDIRECT in the  last 72 hours. Thyroid function studies No results for input(s): TSH, T4TOTAL, T3FREE, THYROIDAB in the last 72 hours.  Invalid input(s): FREET3 Anemia work up No results for input(s): VITAMINB12, FOLATE, FERRITIN, TIBC, IRON, RETICCTPCT in the last 72 hours. Urinalysis    Component Value Date/Time   COLORURINE YELLOW (A) 08/27/2017 0816   APPEARANCEUR CLEAR (A) 08/27/2017 0816   APPEARANCEUR Clear 07/28/2014 2317   LABSPEC 1.011 08/27/2017 0816   LABSPEC 1.024 07/28/2014 2317   PHURINE 6.0 08/27/2017 0816   GLUCOSEU NEGATIVE 08/27/2017 0816   GLUCOSEU Negative 07/28/2014 2317   HGBUR NEGATIVE 08/27/2017 0816   BILIRUBINUR NEGATIVE 08/27/2017 0816   BILIRUBINUR Negative 07/28/2014 2317   KETONESUR NEGATIVE 08/27/2017 0816   PROTEINUR NEGATIVE 08/27/2017 0816   NITRITE NEGATIVE 08/27/2017 0816   LEUKOCYTESUR NEGATIVE 08/27/2017 0816   LEUKOCYTESUR Negative 07/28/2014 2317   Sepsis Labs Invalid input(s): PROCALCITONIN,  WBC,  LACTICIDVEN Microbiology Recent Results (from the past 240 hour(s))  SARS CORONAVIRUS 2 (TAT 6-24 HRS) Nasopharyngeal Nasopharyngeal Swab     Status: None   Collection Time: 12/22/19  9:59 AM   Specimen: Nasopharyngeal Swab  Result Value Ref Range Status   SARS Coronavirus 2 NEGATIVE NEGATIVE Final    Comment: (NOTE) SARS-CoV-2 target nucleic acids are NOT DETECTED. The SARS-CoV-2 RNA is generally detectable in upper and lower respiratory specimens during the acute phase of infection. Negative results do not preclude SARS-CoV-2 infection, do not rule out co-infections with other pathogens, and should not be used as the sole basis for treatment or other patient management decisions. Negative results must be combined with clinical observations, patient history, and epidemiological information. The expected result is Negative. Fact Sheet for Patients: HairSlick.no Fact Sheet for Healthcare  Providers: quierodirigir.com This test is not yet approved or cleared by the [place] FDA and  has been authorized for detection and/or diagnosis of SARS-CoV-2 by FDA under an Emergency Use Authorization (EUA). This EUA will remain  in effect (meaning this test can be used) for the duration of the COVID-19 declaration under Section 56 4(b)(1) of the Act, 21 U.S.C. section 360bbb-3(b)(1), unless the authorization is terminated or revoked sooner. Performed at [facility], [address] [place], [place] [address]      Total time spend on discharging this patient, including the last patient exam, discussing the hospital stay, instructions for ongoing care as it relates to all pertinent caregivers, as well as preparing the medical discharge records, prescriptions, and/or referrals as applicable, is 40 minutes.    Darlin Priestly, MD  [facility] 12/25/2019, 5:00 PM  If 7PM-7AM, please contact night-coverage

## 2019-12-25 NOTE — Progress Notes (Signed)
Iv and telemetry removed for discharge. Medication and discharge instruction reviewed with pt. Understanding verbalized,

## 2019-12-26 ENCOUNTER — Telehealth: Payer: Self-pay | Admitting: Family

## 2019-12-26 NOTE — Telephone Encounter (Signed)
LVM with patient to follow up with him since his recent hospital d/c and to confirm his follow up CHF Clinic appt we scheduled for 3/12. Asked him to call back to confirm.    Deetta Perla, [place]

## 2019-12-27 ENCOUNTER — Telehealth: Payer: Self-pay | Admitting: Family

## 2019-12-27 NOTE — Telephone Encounter (Signed)
LVM 3/8 & 3/9 to confirm patients f/u appointment with the CHF Clinic we scheduled after his recent hospital d/c and to follow up with how he is doing since he has been back home. Asked patient to call us when able. Patient has f/u with [place] scheduled for 3/12.    Deetta Perla, [place]

## 2019-12-29 NOTE — Progress Notes (Deleted)
Patient ID: Charles Rangel, male    DOB: [birth year], 44 y.o.   MRN: [id number]  HPI  Mr Guandique is a 44 y/o male with a history of Dm, HTN, COPD, OSA, DVT, gout, obesity, previous tobacco use and chronic heart failure.   Echo report from 12/22/19 reviewed and showed an EF of 60-65% along with moderate LVH. Echo report from 11/17/2018 reviewed and showed an EF of 60-65%.  Admitted 12/22/19 due to acute on chronic HF. Cardiology and pulmonology consults obtained. Initially given IV lasix and then transitioned to oral diuretics. Discharged after 3 days.   He presents today for a follow-up visit with a chief complaint of   Past Medical History:  Diagnosis Date  . (HFpEF) heart failure with preserved ejection fraction (HCC)    a. 02/2018 Echo: EF 65-70%, mildly dil LA.  . CHF (congestive heart failure) (HCC)   . COPD (chronic obstructive pulmonary disease) (HCC)   . Diabetes mellitus without complication (HCC)   . DVT (deep venous thrombosis) (HCC)   . Gout   . Hypertension   . Morbid obesity (HCC)   . OSA (obstructive sleep apnea)    Past Surgical History:  Procedure Laterality Date  . Left leg surgery for fracture     Family History  Problem Relation Age of Onset  . Hypertension Mother   . Hypertension Father   . CVA Father 71   Social History   Tobacco Use  . Smoking status: Former Smoker    Packs/day: 1.00    Years: 20.00    Pack years: 20.00    Quit date: 10/20/2010    Years since quitting: 9.1  . Smokeless tobacco: Never Used  Substance Use Topics  . Alcohol use: Not Currently   Allergies  Allergen Reactions  . Tylenol [Acetaminophen] Anaphylaxis  . Dog Epithelium Allergy Skin Test Hives  . Apple Hives  . Other Hives    RAISINS     Review of Systems  Constitutional: Negative for appetite change and fatigue.  HENT: Negative for congestion, rhinorrhea and sore throat.   Eyes: Negative.   Respiratory: Positive for shortness of breath (with moderate exertion).  Negative for cough and chest tightness.   Cardiovascular: Positive for palpitations (when climbing stairs) and leg swelling (left ankle). Negative for chest pain.  Gastrointestinal: Negative for abdominal distention and abdominal pain.  Endocrine: Negative.   Genitourinary: Negative.   Musculoskeletal: Positive for arthralgias (left foot).  Skin: Negative.   Allergic/Immunologic: Negative.   Neurological: Positive for headaches (today). Negative for dizziness and light-headedness.  Hematological: Negative for adenopathy. Does not bruise/bleed easily.  Psychiatric/Behavioral: Negative for dysphoric mood and sleep disturbance (sleeping well). The patient is not nervous/anxious.       Physical Exam Vitals and nursing note reviewed.  Constitutional:      Appearance: Normal appearance.  HENT:     Head: Normocephalic and atraumatic.  Cardiovascular:     Rate and Rhythm: Normal rate and regular rhythm.  Pulmonary:     Effort: Pulmonary effort is normal. No respiratory distress.     Breath sounds: No wheezing or rales.  Abdominal:     General: There is no distension.     Palpations: Abdomen is soft.  Musculoskeletal:        General: Tenderness (around left ankle) present.     Cervical back: Normal range of motion and neck supple.     Right lower leg: No edema.     Left lower leg: No edema.  Skin:    General: Skin is warm and dry.  Neurological:     General: No focal deficit present.     Mental Status: He is alert and oriented to person, place, and time.  Psychiatric:        Mood and Affect: Mood normal.        Behavior: Behavior normal.    Assessment & Plan:  1: Chronic heart failure with preserved ejection fraction- - NYHA class II - euvolemic today - weighing daily and he was reminded to call for an overnight weight gain of >2 pounds or a weekly weight gain of >5 pounds - not adding salt but hasn't been reading food labels much. Reviewed the importance of reading food  labels and how to read food labels so that he can keep his daily sodium intake to 2000mg  daily.   - sees cardiology at the [facility] ([place] - BNP 12/22/19 was 232.0 - he does not get the flu vaccine; emphasized good handwashing   2: HTN- - BP - sees PCP at the [facility] - BMP 12/25/19 reviewed and showed sodium 139, potassium 4.4, creatinine 1.53 and GFR >60  3: DM- - A1c 12/22/19 was 5.2% - glucose has been running

## 2019-12-30 ENCOUNTER — Ambulatory Visit: Payer: PRIVATE HEALTH INSURANCE | Admitting: Family

## 2019-12-30 ENCOUNTER — Telehealth: Payer: Self-pay | Admitting: Family

## 2019-12-30 NOTE — Telephone Encounter (Signed)
Patient did not show for his Heart Failure Clinic appointment on 12/30/19. Will attempt to reschedule.

## 2020-03-18 ENCOUNTER — Inpatient Hospital Stay
Admission: EM | Admit: 2020-03-18 | Discharge: 2020-03-23 | DRG: 286 | Disposition: A | Discharge status: Home / Self Care | Payer: PRIVATE HEALTH INSURANCE | Attending: Internal Medicine | Admitting: Internal Medicine

## 2020-03-18 ENCOUNTER — Emergency Department: Payer: PRIVATE HEALTH INSURANCE

## 2020-03-18 ENCOUNTER — Other Ambulatory Visit: Payer: Self-pay

## 2020-03-18 ENCOUNTER — Encounter: Payer: Self-pay | Admitting: Emergency Medicine

## 2020-03-18 DIAGNOSIS — M109 Gout, unspecified: Secondary | ICD-10-CM | POA: Diagnosis present

## 2020-03-18 DIAGNOSIS — Z6841 Body Mass Index (BMI) 40.0 and over, adult: Secondary | ICD-10-CM | POA: Diagnosis not present

## 2020-03-18 DIAGNOSIS — Z87892 Personal history of anaphylaxis: Secondary | ICD-10-CM

## 2020-03-18 DIAGNOSIS — Z86718 Personal history of other venous thrombosis and embolism: Secondary | ICD-10-CM | POA: Diagnosis not present

## 2020-03-18 DIAGNOSIS — I5033 Acute on chronic diastolic (congestive) heart failure: Secondary | ICD-10-CM

## 2020-03-18 DIAGNOSIS — I10 Essential (primary) hypertension: Secondary | ICD-10-CM | POA: Diagnosis present

## 2020-03-18 DIAGNOSIS — Z9119 Patient's noncompliance with other medical treatment and regimen: Secondary | ICD-10-CM

## 2020-03-18 DIAGNOSIS — Z7984 Long term (current) use of oral hypoglycemic drugs: Secondary | ICD-10-CM

## 2020-03-18 DIAGNOSIS — J9622 Acute and chronic respiratory failure with hypercapnia: Secondary | ICD-10-CM | POA: Diagnosis not present

## 2020-03-18 DIAGNOSIS — Z20822 Contact with and (suspected) exposure to covid-19: Secondary | ICD-10-CM | POA: Diagnosis present

## 2020-03-18 DIAGNOSIS — N182 Chronic kidney disease, stage 2 (mild): Secondary | ICD-10-CM | POA: Diagnosis present

## 2020-03-18 DIAGNOSIS — Z79899 Other long term (current) drug therapy: Secondary | ICD-10-CM

## 2020-03-18 DIAGNOSIS — J441 Chronic obstructive pulmonary disease with (acute) exacerbation: Secondary | ICD-10-CM | POA: Diagnosis present

## 2020-03-18 DIAGNOSIS — Z87891 Personal history of nicotine dependence: Secondary | ICD-10-CM

## 2020-03-18 DIAGNOSIS — T502X5A Adverse effect of carbonic-anhydrase inhibitors, benzothiadiazides and other diuretics, initial encounter: Secondary | ICD-10-CM | POA: Diagnosis not present

## 2020-03-18 DIAGNOSIS — I130 Hypertensive heart and chronic kidney disease with heart failure and stage 1 through stage 4 chronic kidney disease, or unspecified chronic kidney disease: Principal | ICD-10-CM | POA: Diagnosis present

## 2020-03-18 DIAGNOSIS — Z7982 Long term (current) use of aspirin: Secondary | ICD-10-CM | POA: Diagnosis not present

## 2020-03-18 DIAGNOSIS — Z886 Allergy status to analgesic agent status: Secondary | ICD-10-CM

## 2020-03-18 DIAGNOSIS — N183 Chronic kidney disease, stage 3 unspecified: Secondary | ICD-10-CM | POA: Diagnosis not present

## 2020-03-18 DIAGNOSIS — E1122 Type 2 diabetes mellitus with diabetic chronic kidney disease: Secondary | ICD-10-CM | POA: Diagnosis present

## 2020-03-18 DIAGNOSIS — E662 Morbid (severe) obesity with alveolar hypoventilation: Secondary | ICD-10-CM | POA: Diagnosis present

## 2020-03-18 DIAGNOSIS — J309 Allergic rhinitis, unspecified: Secondary | ICD-10-CM | POA: Diagnosis present

## 2020-03-18 DIAGNOSIS — Z9109 Other allergy status, other than to drugs and biological substances: Secondary | ICD-10-CM

## 2020-03-18 DIAGNOSIS — J9621 Acute and chronic respiratory failure with hypoxia: Secondary | ICD-10-CM | POA: Diagnosis present

## 2020-03-18 DIAGNOSIS — I272 Pulmonary hypertension, unspecified: Secondary | ICD-10-CM

## 2020-03-18 DIAGNOSIS — I2729 Other secondary pulmonary hypertension: Secondary | ICD-10-CM | POA: Diagnosis present

## 2020-03-18 DIAGNOSIS — N179 Acute kidney failure, unspecified: Secondary | ICD-10-CM | POA: Diagnosis not present

## 2020-03-18 DIAGNOSIS — E119 Type 2 diabetes mellitus without complications: Secondary | ICD-10-CM

## 2020-03-18 DIAGNOSIS — Z91018 Allergy to other foods: Secondary | ICD-10-CM

## 2020-03-18 DIAGNOSIS — Z8249 Family history of ischemic heart disease and other diseases of the circulatory system: Secondary | ICD-10-CM

## 2020-03-18 LAB — CBC WITH DIFFERENTIAL/PLATELET
Abs Immature Granulocytes: 0.03 10*3/uL (ref 0.00–0.07)
Basophils Absolute: 0 10*3/uL (ref 0.0–0.1)
Basophils Relative: 0 %
Eosinophils Absolute: 0.2 10*3/uL (ref 0.0–0.5)
Eosinophils Relative: 2 %
HCT: 46.6 % (ref 39.0–52.0)
Hemoglobin: 14.4 g/dL (ref 13.0–17.0)
Immature Granulocytes: 0 %
Lymphocytes Relative: 12 %
Lymphs Abs: 1.1 10*3/uL (ref 0.7–4.0)
MCH: 24.8 pg — ABNORMAL LOW (ref 26.0–34.0)
MCHC: 30.9 g/dL (ref 30.0–36.0)
MCV: 80.2 fL (ref 80.0–100.0)
Monocytes Absolute: 0.6 10*3/uL (ref 0.1–1.0)
Monocytes Relative: 6 %
Neutro Abs: 7.9 10*3/uL — ABNORMAL HIGH (ref 1.7–7.7)
Neutrophils Relative %: 80 %
Platelets: 292 10*3/uL (ref 150–400)
RBC: 5.81 MIL/uL (ref 4.22–5.81)
RDW: 18.1 % — ABNORMAL HIGH (ref 11.5–15.5)
WBC: 9.9 10*3/uL (ref 4.0–10.5)
nRBC: 0.2 % (ref 0.0–0.2)

## 2020-03-18 LAB — TROPONIN I (HIGH SENSITIVITY)
Troponin I (High Sensitivity): 19 ng/L — ABNORMAL HIGH (ref <18)
Troponin I (High Sensitivity): 21 ng/L — ABNORMAL HIGH (ref <18)

## 2020-03-18 LAB — BASIC METABOLIC PANEL
Anion gap: 12 (ref 5–15)
BUN: 19 mg/dL (ref 6–20)
CO2: 36 mmol/L — ABNORMAL HIGH (ref 22–32)
Calcium: 9.6 mg/dL (ref 8.9–10.3)
Chloride: 93 mmol/L — ABNORMAL LOW (ref 98–111)
Creatinine, Ser: 1.38 mg/dL — ABNORMAL HIGH (ref 0.61–1.24)
GFR calc Af Amer: >60 mL/min (ref >60)
GFR calc non Af Amer: >60 mL/min (ref >60)
Glucose, Bld: 186 mg/dL — ABNORMAL HIGH (ref 70–99)
Potassium: 3.5 mmol/L (ref 3.5–5.1)
Sodium: 141 mmol/L (ref 135–145)

## 2020-03-18 LAB — SARS CORONAVIRUS 2 BY RT PCR (HOSPITAL ORDER, PERFORMED IN ~~LOC~~ HOSPITAL LAB): SARS Coronavirus 2: NEGATIVE

## 2020-03-18 LAB — HEMOGLOBIN A1C

## 2020-03-18 LAB — GLUCOSE, CAPILLARY
Glucose-Capillary: 143 mg/dL — ABNORMAL HIGH (ref 70–99)
Glucose-Capillary: 173 mg/dL — ABNORMAL HIGH (ref 70–99)
Glucose-Capillary: 159 mg/dL — ABNORMAL HIGH (ref 70–99)

## 2020-03-18 LAB — BRAIN NATRIURETIC PEPTIDE: B Natriuretic Peptide: 68.1 pg/mL (ref 0.0–100.0)

## 2020-03-18 MED ORDER — SODIUM CHLORIDE 0.9 % IV SOLN: 250.0000 mL | INTRAVENOUS | Status: DC | PRN

## 2020-03-18 MED ORDER — SODIUM CHLORIDE 0.9% FLUSH: 3.0000 mL | INTRAVENOUS | Status: DC | PRN

## 2020-03-18 MED ORDER — SODIUM CHLORIDE 0.9% FLUSH
3.0000 mL | Freq: Two times a day (BID) | INTRAVENOUS | Status: DC
  Administered 2020-03-18 – 2020-03-20 (×5): 3 mL via INTRAVENOUS

## 2020-03-18 MED ORDER — ONDANSETRON HCL 4 MG/2ML IJ SOLN: 4.0000 mg | Freq: Four times a day (QID) | INTRAMUSCULAR | Status: DC | PRN

## 2020-03-18 MED ORDER — ASPIRIN EC 81 MG PO TBEC
81.0000 mg | DELAYED_RELEASE_TABLET | Freq: Every day | ORAL | Status: DC
  Administered 2020-03-18 – 2020-03-23 (×6): 81 mg via ORAL
  Filled 2020-03-18 (×6): qty 1

## 2020-03-18 MED ORDER — ENOXAPARIN SODIUM 40 MG/0.4ML ~~LOC~~ SOLN
40.0000 mg | Freq: Two times a day (BID) | SUBCUTANEOUS | Status: DC
  Administered 2020-03-18 – 2020-03-19 (×2): 40 mg via SUBCUTANEOUS
  Filled 2020-03-18 (×2): qty 0.4

## 2020-03-18 MED ORDER — VITAMIN D 25 MCG (1000 UNIT) PO TABS
4000.0000 [IU] | ORAL_TABLET | Freq: Every day | ORAL | Status: DC
  Administered 2020-03-18 – 2020-03-23 (×6): 4000 [IU] via ORAL
  Filled 2020-03-18 (×6): qty 4

## 2020-03-18 MED ORDER — ALLOPURINOL 100 MG PO TABS
100.0000 mg | ORAL_TABLET | Freq: Every day | ORAL | Status: DC
  Administered 2020-03-18 – 2020-03-23 (×6): 100 mg via ORAL
  Filled 2020-03-18 (×7): qty 1

## 2020-03-18 MED ORDER — ADULT MULTIVITAMIN W/MINERALS CH
1.0000 | ORAL_TABLET | Freq: Every day | ORAL | Status: DC
  Administered 2020-03-18 – 2020-03-23 (×6): 1 via ORAL
  Filled 2020-03-18 (×6): qty 1

## 2020-03-18 MED ORDER — LISINOPRIL 20 MG PO TABS
40.0000 mg | ORAL_TABLET | Freq: Every day | ORAL | Status: DC
  Administered 2020-03-18: 40 mg via ORAL
  Filled 2020-03-18: qty 2

## 2020-03-18 MED ORDER — NITROGLYCERIN 2 % TD OINT
1.0000 [in_us] | TOPICAL_OINTMENT | Freq: Once | TRANSDERMAL | Status: AC
  Administered 2020-03-18: 1 [in_us] via TOPICAL
  Filled 2020-03-18: qty 1

## 2020-03-18 MED ORDER — CARVEDILOL 25 MG PO TABS
25.0000 mg | ORAL_TABLET | Freq: Two times a day (BID) | ORAL | Status: DC
  Administered 2020-03-18 – 2020-03-23 (×10): 25 mg via ORAL
  Filled 2020-03-18 (×10): qty 1

## 2020-03-18 MED ORDER — FUROSEMIDE 10 MG/ML IJ SOLN
80.0000 mg | Freq: Once | INTRAMUSCULAR | Status: AC
  Administered 2020-03-18: 80 mg via INTRAVENOUS
  Filled 2020-03-18: qty 8

## 2020-03-18 MED ORDER — INSULIN ASPART 100 UNIT/ML ~~LOC~~ SOLN
0.0000 [IU] | Freq: Three times a day (TID) | SUBCUTANEOUS | Status: DC
  Administered 2020-03-18: 4 [IU] via SUBCUTANEOUS
  Administered 2020-03-18: 3 [IU] via SUBCUTANEOUS
  Administered 2020-03-19: 7 [IU] via SUBCUTANEOUS
  Administered 2020-03-19: 3 [IU] via SUBCUTANEOUS
  Administered 2020-03-20: 7 [IU] via SUBCUTANEOUS
  Administered 2020-03-20: 3 [IU] via SUBCUTANEOUS
  Administered 2020-03-20 – 2020-03-21 (×2): 4 [IU] via SUBCUTANEOUS
  Administered 2020-03-21: 15 [IU] via SUBCUTANEOUS
  Administered 2020-03-22: 4 [IU] via SUBCUTANEOUS
  Administered 2020-03-22 (×2): 3 [IU] via SUBCUTANEOUS
  Administered 2020-03-23: 4 [IU] via SUBCUTANEOUS
  Administered 2020-03-23: 3 [IU] via SUBCUTANEOUS
  Filled 2020-03-18 (×14): qty 1

## 2020-03-18 MED ORDER — TRAMADOL HCL 50 MG PO TABS
50.0000 mg | ORAL_TABLET | Freq: Four times a day (QID) | ORAL | Status: DC | PRN
  Administered 2020-03-18 (×2): 50 mg via ORAL
  Filled 2020-03-18 (×2): qty 1

## 2020-03-18 MED ORDER — COLCHICINE 0.6 MG PO TABS
0.3000 mg | ORAL_TABLET | ORAL | Status: DC
  Administered 2020-03-18 – 2020-03-22 (×3): 0.3 mg via ORAL
  Filled 2020-03-18: qty 0.5
  Filled 2020-03-18: qty 1
  Filled 2020-03-18: qty 0.5
  Filled 2020-03-18 (×3): qty 1
  Filled 2020-03-18: qty 0.5

## 2020-03-18 MED ORDER — ENOXAPARIN SODIUM 40 MG/0.4ML ~~LOC~~ SOLN: 40.0000 mg | SUBCUTANEOUS | Status: DC

## 2020-03-18 MED ORDER — IPRATROPIUM-ALBUTEROL 0.5-2.5 (3) MG/3ML IN SOLN: 3.0000 mL | Freq: Four times a day (QID) | RESPIRATORY_TRACT | Status: DC | PRN

## 2020-03-18 MED ORDER — ACETAMINOPHEN 325 MG PO TABS: 650.0000 mg | ORAL_TABLET | ORAL | Status: DC | PRN

## 2020-03-18 MED ORDER — FUROSEMIDE 10 MG/ML IJ SOLN
40.0000 mg | Freq: Two times a day (BID) | INTRAMUSCULAR | Status: DC
  Administered 2020-03-18 – 2020-03-19 (×2): 40 mg via INTRAVENOUS
  Filled 2020-03-18 (×2): qty 4

## 2020-03-18 NOTE — ED Provider Notes (Signed)
[facility] Emergency Department Provider Note   ____________________________________________   First MD Initiated Contact with Patient 03/18/20 [phone]     (approximate)  I have reviewed the triage vital signs and the nursing notes.   HISTORY  Chief Complaint Shortness of Breath    HPI Charles Rangel is a 44 y.o. male with past medical history of hypertension, diabetes, and diastolic CHF who presents to the ED complaining of shortness of breath.  Patient reports that he has been increasingly short of breath over the past 7 days with a nonproductive cough and some discomfort in his left chest.  He describes the pain in his chest as feeling like "fluid in my chest" and has noted weight gain over the past couple of days along with some swelling in his legs.  He typically sleeps in a recliner and states this is unchanged recently.  He continues to take Lasix twice a day, but his urine output has decreased over the past couple of days.        Past Medical History:  Diagnosis Date  . (HFpEF) heart failure with preserved ejection fraction (HCC)    a. 02/2018 Echo: EF 65-70%, mildly dil LA.  . CHF (congestive heart failure) (HCC)   . COPD (chronic obstructive pulmonary disease) (HCC)   . Diabetes mellitus without complication (HCC)   . DVT (deep venous thrombosis) (HCC)   . Gout   . Hypertension   . Morbid obesity (HCC)   . OSA (obstructive sleep apnea)     Patient Active Problem List   Diagnosis Date Noted  . Obesity, Class III, BMI 40-49.9 (morbid obesity) (HCC) 12/22/2019  . HTN (hypertension) 11/23/2018  . DM (diabetes mellitus) (HCC) 11/23/2018  . Gout 11/16/2018  . CHF (congestive heart failure) (HCC) 11/14/2018  . Acute on chronic diastolic CHF (congestive heart failure) (HCC) 06/28/2018    Past Surgical History:  Procedure Laterality Date  . Left leg surgery for fracture      Prior to Admission medications   Medication Sig Start Date End  Date Taking? Authorizing Provider  albuterol (VENTOLIN HFA) 108 (90 Base) MCG/ACT inhaler Inhale 2 puffs into the lungs every 6 (six) hours as needed for wheezing or shortness of breath. 08/27/19   Chesley Noon, MD  allopurinol (ZYLOPRIM) 100 MG tablet Take 1 tablet (100 mg total) by mouth daily. 11/18/18   Stormy Fabian, PA  aspirin EC 81 MG tablet Take 81 mg daily by mouth.    [provider]  carvedilol (COREG) 25 MG tablet Take 1 tablet (25 mg total) by mouth 2 (two) times daily with a meal. 12/25/19 03/24/20  Darlin Priestly, MD  Cholecalciferol (VITAMIN D3) 1000 units CAPS Take 4 capsules by mouth daily.     [provider]  colchicine 0.6 MG tablet Take 0.5 tablets (0.3 mg total) by mouth every other day. 12/25/19   Darlin Priestly, MD  furosemide (LASIX) 80 MG tablet Take 1 tablet (80 mg total) by mouth 2 (two) times daily. Your new water pills. 12/25/19 03/24/20  Darlin Priestly, MD  lisinopril (PRINIVIL,ZESTRIL) 40 MG tablet Take 1 tablet (40 mg total) by mouth daily for 30 doses. 11/18/18 12/22/19  Stormy Fabian, PA  metFORMIN (GLUCOPHAGE) 500 MG tablet Take 1,000 mg by mouth 2 (two) times daily with a meal.     [provider]  Multiple Vitamin (MULTIVITAMIN WITH MINERALS) TABS tablet Take 1 tablet by mouth daily.    [provider]    Allergies  Tylenol [acetaminophen], Dog epithelium allergy skin test, Apple, and Other  Family History  Problem Relation Age of Onset  . Hypertension Mother   . Hypertension Father   . CVA Father 60    Social History Social History   Tobacco Use  . Smoking status: Former Smoker    Packs/day: 1.00    Years: 20.00    Pack years: 20.00    Quit date: 10/20/2010    Years since quitting: 9.4  . Smokeless tobacco: Never Used  Substance Use Topics  . Alcohol use: Not Currently  . Drug use: No    Review of Systems  Constitutional: No fever/chills Eyes: No visual changes. ENT: No sore throat. Cardiovascular: Positive for chest  pain. Respiratory: Positive for cough and shortness of breath. Gastrointestinal: No abdominal pain.  No nausea, no vomiting.  No diarrhea.  No constipation. Genitourinary: Negative for dysuria. Musculoskeletal: Negative for back pain. Skin: Negative for rash. Neurological: Negative for headaches, focal weakness or numbness.  ____________________________________________   PHYSICAL EXAM:  VITAL SIGNS: ED Triage Vitals  Enc Vitals Group     BP 03/18/20 0754 (!) 173/93     Pulse Rate 03/18/20 0754 (!) 120     Resp 03/18/20 0754 (!) 22     Temp 03/18/20 0754 98.5 F (36.9 C)     Temp src --      SpO2 03/18/20 0753 (!) 81 %     Weight 03/18/20 0755 (!) 318 lb (144.2 kg)     Height 03/18/20 0755 5\' 6"  (1.676 m)     Head Circumference --      Peak Flow --      Pain Score 03/18/20 0754 8     Pain Loc --      Pain Edu? --      Excl. in [place]? --     Constitutional: Alert and oriented. Eyes: Conjunctivae are normal. Head: Atraumatic. Nose: No congestion/rhinnorhea. Mouth/Throat: Mucous membranes are moist. Neck: Normal ROM Cardiovascular: Normal rate, regular rhythm. Grossly normal heart sounds. Respiratory: Tachypneic with increased respiratory effort.  No retractions. Lungs with crackles to bilateral bases. Gastrointestinal: Soft and nontender. No distention. Genitourinary: deferred Musculoskeletal: No lower extremity tenderness, 1+ pitting edema to bilateral lower extremities. Neurologic:  Normal speech and language. No gross focal neurologic deficits are appreciated. Skin:  Skin is warm, dry and intact. No rash noted. Psychiatric: Mood and affect are normal. Speech and behavior are normal.  ____________________________________________   LABS (all labs ordered are listed, but only abnormal results are displayed)  Labs Reviewed  BASIC METABOLIC PANEL - Abnormal; Notable for the following components:      Result Value   Chloride 93 (*)    CO2 36 (*)    Glucose, Bld 186  (*)    Creatinine, Ser 1.38 (*)    All other components within normal limits  CBC WITH DIFFERENTIAL/PLATELET - Abnormal; Notable for the following components:   MCH 24.8 (*)    RDW 18.1 (*)    Neutro Abs 7.9 (*)    All other components within normal limits  TROPONIN I (HIGH SENSITIVITY) - Abnormal; Notable for the following components:   Troponin I (High Sensitivity) 19 (*)    All other components within normal limits  SARS CORONAVIRUS 2 BY RT PCR ([facility])  BRAIN NATRIURETIC PEPTIDE   ____________________________________________  EKG  ED ECG REPORT I, Blake Divine, the attending physician, personally viewed and interpreted this ECG.   Date:  03/18/2020  EKG Time: 7:58  Rate: 118  Rhythm: sinus tachycardia  Axis: Normal  Intervals:none  ST&T Change: none   PROCEDURES  Procedure(s) performed (including Critical Care):  .Critical Care Performed by: Chesley Noon, MD Authorized by: Chesley Noon, MD   Critical care provider statement:    Critical care time (minutes):  45   Critical care time was exclusive of:  Separately billable procedures and treating other patients and teaching time   Critical care was necessary to treat or prevent imminent or life-threatening deterioration of the following conditions:  Respiratory failure   Critical care was time spent personally by me on the following activities:  Discussions with consultants, evaluation of patient's response to treatment, examination of patient, ordering and performing treatments and interventions, ordering and review of laboratory studies, ordering and review of radiographic studies, pulse oximetry, re-evaluation of patient's condition, obtaining history from patient or surrogate and review of old charts   I assumed direction of critical care for this patient from another provider in my specialty: no   .1-3 Lead EKG Interpretation Performed by: Chesley Noon,  MD Authorized by: Chesley Noon, MD     Interpretation: normal     ECG rate:  105   ECG rate assessment: tachycardic     Rhythm: sinus tachycardia     Ectopy: none     Conduction: normal       ____________________________________________   INITIAL IMPRESSION / ASSESSMENT AND PLAN / ED COURSE       44 year old male with past medical history of hypertension, diabetes, and CHF presents to the ED with increased shortness of breath and weight gain over the past week, also reports discomfort in his chest that feels like there is fluid there.  He arrives tachypneic with O2 sats of 81% on room air, subsequently placed on 5 L nasal cannula with improvement.  He has signs of fluid overload concerning for CHF exacerbation, blood pressure also elevated.  We will initially treat with nitroglycerin paste pending chest x-ray and labs, after which he would likely benefit from IV Lasix.  EKG shows sinus tachycardia with no evidence of acute ischemia, chest pain would be atypical for ACS.  Chest x-ray consistent with pulmonary edema and concern for pulmonary hypertension.  Patient reports his breathing feels better following placement of Nitropaste, we will also give a dose of IV Lasix.  Lab work thus far is unremarkable, troponin mildly elevated, likely due to his chronic kidney disease.  Creatinine is stable today.  Case discussed with hospitalist for admission.      ____________________________________________   FINAL CLINICAL IMPRESSION(S) / ED DIAGNOSES  Final diagnoses:  Acute on chronic diastolic congestive heart failure (HCC)  Pulmonary hypertension [facility])     ED Discharge Orders    None       Note:  This document was prepared using Dragon voice recognition software and may include unintentional dictation errors.   Chesley Noon, MD 03/18/20 [phone]

## 2020-03-18 NOTE — ED Notes (Signed)
Pt able to use urinal without assistance.  

## 2020-03-18 NOTE — H&P (Signed)
History and Physical    Charles Rangel MOQ:[id number] DOB: 08-May-1976 DOA: 03/18/2020  [facility]P: [facility]   Patient coming from: Home  I have personally briefly reviewed patient's old medical records in [facility]  Chief Complaint: Shortness of breath  HPI: Charles Rangel is a 44 y.o. male with medical history significant for hypertension, diabetes mellitus and chronic diastolic dysfunction CHF who presents to the emergency room for evaluation of worsening shortness of breath over the last 1 week.  Patient states that he has been increasingly short of breath over the last week usually worse at night.  Shortness of breath was initially with exertion but now he is short of breath at rest.  He also has a nonproductive cough.  Patient states that he gained 4 pounds over his baseline weight and had taken an extra diuretic pill without any significant improvement in his urine output.  He claims to be compliant with his prescribed medications. He denies having any chest pain, abdominal pain, fever, chills, dizziness or lightheadedness. He has had frontal headaches over the last couple of days and states that he usually rides it out because he has allergies to acetaminophen and does not like taking NSAIDs because of his hypertension. Chest x-ray shows cardiomegaly with apparent degree of pulmonary arterial hypertension and vascular congestion. No frank edema or airspace consolidation. Twelve-lead EKG shows sinus tachycardia.  ED Course: 44 year old morbidly obese African-American male with a history of diabetes mellitus, hypertension chronic diastolic dysfunction CHF who presents to the emergency room for evaluation of worsening shortness of breath, initially with exertion but now at rest.  Chest x-ray suggestive of vascular congestion.  Patient will be admitted to the hospital for further evaluation.  Review of Systems: As per HPI otherwise 10 point review of systems negative.      Past Medical History:  Diagnosis Date  . (HFpEF) heart failure with preserved ejection fraction (HCC)    a. 02/2018 Echo: EF 65-70%, mildly dil LA.  . CHF (congestive heart failure) (HCC)   . COPD (chronic obstructive pulmonary disease) (HCC)   . Diabetes mellitus without complication (HCC)   . DVT (deep venous thrombosis) (HCC)   . Gout   . Hypertension   . Morbid obesity (HCC)   . OSA (obstructive sleep apnea)     Past Surgical History:  Procedure Laterality Date  . Left leg surgery for fracture       reports that he quit smoking about 9 years ago. He has a 20.00 pack-year smoking history. He has never used smokeless tobacco. He reports previous alcohol use. He reports that he does not use drugs.  Allergies  Allergen Reactions  . Tylenol [Acetaminophen] Anaphylaxis  . Dog Epithelium Allergy Skin Test Hives  . Apple Hives  . Other Hives    RAISINS    Family History  Problem Relation Age of Onset  . Hypertension Mother   . Hypertension Father   . CVA Father 78     Prior to Admission medications   Medication Sig Start Date End Date Taking? Authorizing Provider  albuterol (VENTOLIN HFA) 108 (90 Base) MCG/ACT inhaler Inhale 2 puffs into the lungs every 6 (six) hours as needed for wheezing or shortness of breath. 08/27/19   Chesley Noon, MD  allopurinol (ZYLOPRIM) 100 MG tablet Take 1 tablet (100 mg total) by mouth daily. 11/18/18   Stormy Fabian, PA  aspirin EC 81 MG tablet Take 81 mg daily by mouth.    [provider]  carvedilol (COREG) 25 MG tablet Take 1 tablet (25 mg total) by mouth 2 (two) times daily with a meal. 12/25/19 03/24/20  Enzo Bi, MD  Cholecalciferol (VITAMIN D3) 1000 units CAPS Take 4 capsules by mouth daily.     [provider]  colchicine 0.6 MG tablet Take 0.5 tablets (0.3 mg total) by mouth every other day. 12/25/19   Enzo Bi, MD  furosemide (LASIX) 80 MG tablet Take 1 tablet (80 mg total) by mouth 2 (two) times daily. Your new  water pills. 12/25/19 03/24/20  Enzo Bi, MD  lisinopril (PRINIVIL,ZESTRIL) 40 MG tablet Take 1 tablet (40 mg total) by mouth daily for 30 doses. 11/18/18 12/22/19  Ripley Fraise, PA  metFORMIN (GLUCOPHAGE) 500 MG tablet Take 1,000 mg by mouth 2 (two) times daily with a meal.     [provider]  Multiple Vitamin (MULTIVITAMIN WITH MINERALS) TABS tablet Take 1 tablet by mouth daily.    [provider]    Physical Exam: Vitals:   03/18/20 0830 03/18/20 0900 03/18/20 0915 03/18/20 0930  BP: (!) 155/94 (!) 167/101 (!) 177/135 (!) 174/116  Pulse: (!) 114 (!) 111 (!) 110 (!) 112  Resp: 16 16 15 13   Temp:      SpO2: 96% 97% 98% 97%  Weight:      Height:         Vitals:   03/18/20 0830 03/18/20 0900 03/18/20 0915 03/18/20 0930  BP: (!) 155/94 (!) 167/101 (!) 177/135 (!) 174/116  Pulse: (!) 114 (!) 111 (!) 110 (!) 112  Resp: 16 16 15 13   Temp:      SpO2: 96% 97% 98% 97%  Weight:      Height:        Constitutional: NAD, alert and oriented x 3. Chronically ill appearing Eyes: PERRL, lids and conjunctivae normal ENMT: Mucous membranes are moist.  Neck: normal, supple, no masses, no thyromegaly Respiratory: rales at the bases, no wheezing, no crackles. Normal respiratory effort. No accessory muscle use.  Cardiovascular: Tachycardic,  no murmurs / rubs / gallops. No extremity edema. 2+ pedal pulses. No carotid bruits.  Abdomen: no tenderness, no masses palpated. No hepatosplenomegaly. Bowel sounds positive.  Central adiposity Musculoskeletal: no clubbing / cyanosis. No joint deformity upper and lower extremities.  Skin: no rashes, lesions, ulcers.  Hyperpigmentation of lower extremities Neurologic: No gross focal neurologic deficit. Psychiatric: Normal mood and affect.   Labs on Admission: I have personally reviewed following labs and imaging studies  CBC: Recent Labs  Lab 03/18/20 0843  WBC 9.9  NEUTROABS 7.9*  HGB 14.4  HCT 46.6  MCV 80.2  PLT 009   Basic  Metabolic Panel: Recent Labs  Lab 03/18/20 0843  NA 141  K 3.5  CL 93*  CO2 36*  GLUCOSE 186*  BUN 19  CREATININE 1.38*  CALCIUM 9.6   GFR: Estimated Creatinine Clearance: 91.8 mL/min (A) (by C-G formula based on SCr of 1.38 mg/dL (H)). Liver Function Tests: No results for input(s): AST, ALT, ALKPHOS, BILITOT, PROT, ALBUMIN in the last 168 hours. No results for input(s): LIPASE, AMYLASE in the last 168 hours. No results for input(s): AMMONIA in the last 168 hours. Coagulation Profile: No results for input(s): INR, PROTIME in the last 168 hours. Cardiac Enzymes: No results for input(s): CKTOTAL, CKMB, CKMBINDEX, TROPONINI in the last 168 hours. BNP (last 3 results) No results for input(s): PROBNP in the last 8760 hours. HbA1C: No results for input(s): HGBA1C in the last 72 hours.  CBG: No results for input(s): GLUCAP in the last 168 hours. Lipid Profile: No results for input(s): CHOL, HDL, LDLCALC, TRIG, CHOLHDL, LDLDIRECT in the last 72 hours. Thyroid Function Tests: No results for input(s): TSH, T4TOTAL, FREET4, T3FREE, THYROIDAB in the last 72 hours. Anemia Panel: No results for input(s): VITAMINB12, FOLATE, FERRITIN, TIBC, IRON, RETICCTPCT in the last 72 hours. Urine analysis:    Component Value Date/Time   COLORURINE YELLOW (A) 08/27/2017 0816   APPEARANCEUR CLEAR (A) 08/27/2017 0816   APPEARANCEUR Clear 07/28/2014 2317   LABSPEC 1.011 08/27/2017 0816   LABSPEC 1.024 07/28/2014 2317   PHURINE 6.0 08/27/2017 0816   GLUCOSEU NEGATIVE 08/27/2017 0816   GLUCOSEU Negative 07/28/2014 2317   HGBUR NEGATIVE 08/27/2017 0816   BILIRUBINUR NEGATIVE 08/27/2017 0816   BILIRUBINUR Negative 07/28/2014 2317   KETONESUR NEGATIVE 08/27/2017 0816   PROTEINUR NEGATIVE 08/27/2017 0816   NITRITE NEGATIVE 08/27/2017 0816   LEUKOCYTESUR NEGATIVE 08/27/2017 0816   LEUKOCYTESUR Negative 07/28/2014 2317    Radiological Exams on Admission: DG Chest Portable 1 View  Result Date:  03/18/2020 CLINICAL DATA:  Shortness of breath EXAM: PORTABLE CHEST 1 VIEW COMPARISON:  December 22, 2019 chest radiograph and chest CT FINDINGS: There is cardiomegaly with prominence of the main pulmonary artery suggesting a degree of underlying pulmonary arterial hypertension. There is interstitial prominence, likely due to a degree of vascular congestion. No edema or airspace opacity evident. No adenopathy. No bone lesions. IMPRESSION: Cardiomegaly with apparent degree of pulmonary arterial hypertension and vascular congestion. No frank edema or airspace consolidation. Appearance similar to most recent prior studies. Electronically Signed   By: Bretta Bang III M.D.   On: 03/18/2020 08:36    EKG: Independently reviewed.  Sinus tachycardia  Assessment/Plan Principal Problem:   Acute on chronic diastolic CHF (congestive heart failure) (HCC) Active Problems:   HTN (hypertension)   DM (diabetes mellitus) (HCC)   Obesity, Class III, BMI 40-49.9 (morbid obesity) (HCC)    Acute on chronic diastolic dysfunction CHF Patient presents for evaluation of shortness of breath associated with orthopnea and weight gain We will place patient on Lasix 40 mg IV every 12 Optimize blood pressure control Continue metoprolol and lisinopril Patient's chest x-ray is suggestive of pulmonary hypertension and during his last hospitalization he had a CT angiogram done to rule out PE which was negative and showed severe chronic pulmonary arterial hypertension Patient will need to follow-up with pulmonology as an outpatient for further evaluation   Morbid obesity (BMI 51) Complicates overall prognosis and care   Hypertension Continue lisinopril and metoprolol   Diabetes mellitus Patient is on Metformin which be placed on hold  Will place patient on sliding scale coverage Continue consistent carbohydrate diet   Gout Stable and not in acute exacerbation Continue allopurinol  DVT prophylaxis:   Lovenox Code Status: Full code Family Communication: Greater than 50% of time was spent discussing patient's condition and plan of care with him at the bedside.  All questions and concerns have been addressed.  He verbalizes understanding and agrees with the plan. Disposition Plan: Back to previous home environment Consults called: None   Kohei Antonellis MD [facility]     03/18/2020, 10:46 AM

## 2020-03-18 NOTE — ED Triage Notes (Signed)
PT to ER states difficulty breathing and feels like he has fluid build up due to increased abdominal discomfort.  Pt talking in complete uninterrupted sentences.  States symptoms started at least a week ago.

## 2020-03-18 NOTE — Progress Notes (Signed)
Anticoagulation monitoring(Lovenox):  44 yo  male ordered Lovenox 40 mg Q24h  Filed Weights   03/18/20 0755  Weight: (!) 144.2 kg (318 lb)   Body mass index is 51.33 kg/m.    Lab Results  Component Value Date   CREATININE 1.38 (H) 03/18/2020   CREATININE 1.53 (H) 12/25/2019   CREATININE 1.35 (H) 12/24/2019   Estimated Creatinine Clearance: 91.8 mL/min (A) (by C-G formula based on SCr of 1.38 mg/dL (H)). Hemoglobin & Hematocrit     Component Value Date/Time   HGB 14.4 03/18/2020 0843   HGB 14.8 07/28/2014 2317   HCT 46.6 03/18/2020 0843   HCT 46.2 07/28/2014 2317     Per Protocol for Patient with estCrcl > 30 ml/min and BMI > 40, will transition to Lovenox 40 mg Q12h.

## 2020-03-19 DIAGNOSIS — I272 Pulmonary hypertension, unspecified: Secondary | ICD-10-CM | POA: Diagnosis present

## 2020-03-19 DIAGNOSIS — E1122 Type 2 diabetes mellitus with diabetic chronic kidney disease: Secondary | ICD-10-CM | POA: Diagnosis present

## 2020-03-19 DIAGNOSIS — N183 Chronic kidney disease, stage 3 unspecified: Secondary | ICD-10-CM

## 2020-03-19 DIAGNOSIS — Z6841 Body Mass Index (BMI) 40.0 and over, adult: Secondary | ICD-10-CM

## 2020-03-19 DIAGNOSIS — J441 Chronic obstructive pulmonary disease with (acute) exacerbation: Secondary | ICD-10-CM | POA: Diagnosis present

## 2020-03-19 LAB — BASIC METABOLIC PANEL
Anion gap: 10 (ref 5–15)
BUN: 25 mg/dL — ABNORMAL HIGH (ref 6–20)
CO2: 39 mmol/L — ABNORMAL HIGH (ref 22–32)
Calcium: 10 mg/dL (ref 8.9–10.3)
Chloride: 92 mmol/L — ABNORMAL LOW (ref 98–111)
Creatinine, Ser: 1.79 mg/dL — ABNORMAL HIGH (ref 0.61–1.24)
GFR calc Af Amer: 52 mL/min — ABNORMAL LOW (ref >60)
GFR calc non Af Amer: 45 mL/min — ABNORMAL LOW (ref >60)
Glucose, Bld: 128 mg/dL — ABNORMAL HIGH (ref 70–99)
Potassium: 4.1 mmol/L (ref 3.5–5.1)
Sodium: 141 mmol/L (ref 135–145)

## 2020-03-19 LAB — BLOOD GAS, ARTERIAL
Acid-Base Excess: 17.7 mmol/L — ABNORMAL HIGH (ref 0.0–2.0)
Bicarbonate: 48 mmol/L — ABNORMAL HIGH (ref 20.0–28.0)
FIO2: 0.28
O2 Saturation: 92.3 %
Patient temperature: 37
pCO2 arterial: 87 mmHg (ref 32.0–48.0)
pH, Arterial: 7.35 (ref 7.350–7.450)
pO2, Arterial: 68 mmHg — ABNORMAL LOW (ref 83.0–108.0)

## 2020-03-19 LAB — GLUCOSE, CAPILLARY
Glucose-Capillary: 114 mg/dL — ABNORMAL HIGH (ref 70–99)
Glucose-Capillary: 135 mg/dL — ABNORMAL HIGH (ref 70–99)
Glucose-Capillary: 238 mg/dL — ABNORMAL HIGH (ref 70–99)
Glucose-Capillary: 266 mg/dL — ABNORMAL HIGH (ref 70–99)

## 2020-03-19 MED ORDER — FUROSEMIDE 10 MG/ML IJ SOLN
60.0000 mg | Freq: Two times a day (BID) | INTRAMUSCULAR | Status: DC
  Administered 2020-03-19 – 2020-03-20 (×2): 60 mg via INTRAVENOUS
  Filled 2020-03-19 (×2): qty 6

## 2020-03-19 MED ORDER — METHYLPREDNISOLONE SODIUM SUCC 40 MG IJ SOLR
40.0000 mg | Freq: Every day | INTRAMUSCULAR | Status: DC
  Administered 2020-03-19 – 2020-03-21 (×3): 40 mg via INTRAVENOUS
  Filled 2020-03-19 (×3): qty 1

## 2020-03-19 NOTE — Plan of Care (Signed)
  Problem: Education: Goal: Knowledge of General Education information will improve Description: Including pain rating scale, medication(s)/side effects and non-pharmacologic comfort measures Outcome: Progressing   Problem: Health Behavior/Discharge Planning: Goal: Ability to manage health-related needs will improve Outcome: Not Progressing Note: Patient ordered a pulmonology, cardiology, and PT consult today. Also started on IV Solu-Medrol for breathing difficulties. Will continue to monitor overall progression for the remainder of the shift. Jari Favre [facility]

## 2020-03-19 NOTE — Consult Note (Signed)
Pulmonary Medicine          Date: 03/19/2020,   MRN# [id number] Charles Rangel 12-Nov-1975     AdmissionWeight: (!) 144.2 kg                 CurrentWeight: (!) 147.2 kg  Referring physician: Dr. Algie Coffer    CHIEF COMPLAINT:   New onset pulmonary hypertension   HISTORY OF PRESENT ILLNESS   This is a pleasant male with a history of gout, COPD, essential hypertension, obstructive sleep apnea, morbid obesity, CHF with preserved ejection fraction, who came in due to progressively worsening shortness of breath 1 week prior to admission.  He shares that dyspnea initially was with exertion only however progressively became at rest which had prompted him to seek medical attention.  He does take diuretics however reports gaining over 4 pounds in just few days.  Patient did have some chest imaging with findings of mosaic attenuation throughout both lungs as well as a markedly dilated main pulmonary artery suggestive of pulmonary arterial hypertension.  Pulmonary consultation for further evaluation management of dyspnea with pulmonary hypertension.  Patient works with [employer] company and does have CPAP device however does not use it. He is with excessive daytime sleepiness.    PAST MEDICAL HISTORY   Past Medical History:  Diagnosis Date  . (HFpEF) heart failure with preserved ejection fraction ([place])    a. 02/2018 Echo: EF 65-70%, mildly dil LA.  . CHF (congestive heart failure) ([place])   . COPD (chronic obstructive pulmonary disease) ([place])   . Diabetes mellitus without complication ([place])   . DVT (deep venous thrombosis) ([place])   . Gout   . Hypertension   . Morbid obesity ([place])   . OSA (obstructive sleep apnea)      SURGICAL HISTORY   Past Surgical History:  Procedure Laterality Date  . Left leg surgery for fracture       FAMILY HISTORY   Family History  Problem Relation Age of Onset  . Hypertension Mother   . Hypertension Father   . CVA Father 73      SOCIAL HISTORY   Social History   Tobacco Use  . Smoking status: Former Smoker    Packs/day: 1.00    Years: 20.00    Pack years: 20.00    Quit date: 10/20/2010    Years since quitting: 9.4  . Smokeless tobacco: Never Used  Substance Use Topics  . Alcohol use: Not Currently  . Drug use: No     MEDICATIONS    Home Medication:    Current Medication:  Current Facility-Administered Medications:  .  0.9 %  sodium chloride infusion, 250 mL, Intravenous, PRN, Agbata, Tochukwu, MD .  allopurinol (ZYLOPRIM) tablet 100 mg, 100 mg, Oral, Daily, Agbata, Tochukwu, MD, 100 mg at 03/19/20 0913 .  aspirin EC tablet 81 mg, 81 mg, Oral, Daily, Agbata, Tochukwu, MD, 81 mg at 03/19/20 0913 .  carvedilol (COREG) tablet 25 mg, 25 mg, Oral, BID WC, Agbata, Tochukwu, MD, 25 mg at 03/19/20 0803 .  cholecalciferol (VITAMIN D3) tablet 4,000 Units, 4,000 Units, Oral, Daily, Agbata, Tochukwu, MD, 4,000 Units at 03/19/20 0912 .  colchicine tablet 0.3 mg, 0.3 mg, Oral, QODAY, Agbata, Tochukwu, MD, 0.3 mg at 03/18/20 1313 .  enoxaparin (LOVENOX) injection 40 mg, 40 mg, Subcutaneous, Q12H, Rocky Morel, RPH, 40 mg at 03/19/20 0913 .  furosemide (LASIX) injection 60 mg, 60 mg, Intravenous, BID, Wieting, Richard, MD .  insulin aspart (  novoLOG) injection 0-20 Units, 0-20 Units, Subcutaneous, TID WC, Agbata, Tochukwu, MD, 4 Units at 03/18/20 1737 .  ipratropium-albuterol (DUONEB) 0.5-2.5 (3) MG/3ML nebulizer solution 3 mL, 3 mL, Nebulization, Q6H PRN, Agbata, Tochukwu, MD .  methylPREDNISolone sodium succinate (SOLU-MEDROL) 40 mg/mL injection 40 mg, 40 mg, Intravenous, Daily, Wieting, Richard, MD, 40 mg at 03/19/20 1116 .  multivitamin with minerals tablet 1 tablet, 1 tablet, Oral, Daily, Agbata, Tochukwu, MD, 1 tablet at 03/19/20 0913 .  ondansetron (ZOFRAN) injection 4 mg, 4 mg, Intravenous, Q6H PRN, Agbata, Tochukwu, MD .  sodium chloride flush (NS) 0.9 % injection 3 mL, 3 mL, Intravenous, Q12H, Agbata,  Tochukwu, MD, 3 mL at 03/19/20 0913 .  sodium chloride flush (NS) 0.9 % injection 3 mL, 3 mL, Intravenous, PRN, Agbata, Tochukwu, MD .  traMADol (ULTRAM) tablet 50 mg, 50 mg, Oral, Q6H PRN, Agbata, Tochukwu, MD, 50 mg at 03/18/20 2158    ALLERGIES   Tylenol [acetaminophen], Dog epithelium allergy skin test, Apple, and Other     REVIEW OF SYSTEMS    Review of Systems:  Gen:  Denies  fever, sweats, chills weigh loss  HEENT: Denies blurred vision, double vision, ear pain, eye pain, hearing loss, nose bleeds, sore throat Cardiac:  No dizziness, chest pain or heaviness, chest tightness,edema Resp:   Denies cough or sputum porduction, shortness of breath,wheezing, hemoptysis,  Gi: Denies swallowing difficulty, stomach pain, nausea or vomiting, diarrhea, constipation, bowel incontinence Gu:  Denies bladder incontinence, burning urine Ext:   Denies Joint pain, stiffness or swelling Skin: Denies  skin rash, easy bruising or bleeding or hives Endoc:  Denies polyuria, polydipsia , polyphagia or weight change Psych:   Denies depression, insomnia or hallucinations   Other:  All other systems negative   VS: BP (!) 150/70 (BP Location: Left Arm)   Pulse 96   Temp 99 F (37.2 C)   Resp 14   Ht 5\' 6"  (1.676 m)   Wt (!) 147.2 kg   SpO2 99%   BMI 52.39 kg/m      PHYSICAL EXAM    GENERAL:NAD, no fevers, chills, no weakness no fatigue HEAD: Normocephalic, atraumatic.  EYES: Pupils equal, round, reactive to light. Extraocular muscles intact. No scleral icterus.  MOUTH: Moist mucosal membrane. Dentition intact. No abscess noted.  EAR, NOSE, THROAT: Clear without exudates. No external lesions.  NECK: Supple. No thyromegaly. No nodules. No JVD.  PULMONARY: Diffuse coarse rhonchi right sided +wheezes CARDIOVASCULAR: S1 and S2. Regular rate and rhythm. No murmurs, rubs, or gallops. No edema. Pedal pulses 2+ bilaterally.  GASTROINTESTINAL: Soft, nontender, nondistended. No masses.  Positive bowel sounds. No hepatosplenomegaly.  MUSCULOSKELETAL: No swelling, clubbing, or edema. Range of motion full in all extremities.  NEUROLOGIC: Cranial nerves II through XII are intact. No gross focal neurological deficits. Sensation intact. Reflexes intact.  SKIN: No ulceration, lesions, rashes, or cyanosis. Skin warm and dry. Turgor intact.  PSYCHIATRIC: Mood, affect within normal limits. The patient is awake, alert and oriented x 3. Insight, judgment intact.       IMAGING    DG Chest Portable 1 View  Result Date: 03/18/2020 CLINICAL DATA:  Shortness of breath EXAM: PORTABLE CHEST 1 VIEW COMPARISON:  December 22, 2019 chest radiograph and chest CT FINDINGS: There is cardiomegaly with prominence of the main pulmonary artery suggesting a degree of underlying pulmonary arterial hypertension. There is interstitial prominence, likely due to a degree of vascular congestion. No edema or airspace opacity evident. No adenopathy. No bone lesions. IMPRESSION:  Cardiomegaly with apparent degree of pulmonary arterial hypertension and vascular congestion. No frank edema or airspace consolidation. Appearance similar to most recent prior studies. Electronically Signed   By: Bretta Bang III M.D.   On: 03/18/2020 08:36      ASSESSMENT/PLAN   Acute hypoxemic respiratory failure   -Multifactorial in etiology-including diastolic CHF,Mor[place]d obesity,  severe obstructive sleep apnea, pickwickian physiology,deconditioning,  significant pulmonary hypertension of undetermined severity.  -Status post CT PE protocol with no PE found  -Mosaic attenuation and and generous pulmonary artery suggestive of PAH  -Patient will require right heart cath to determine pressures with pre-/post capillary subclass of PAH as well as vasoreactivity  -No history of chronic thromboembolic disease  -Patient does have a history of COPD which is well known to cause PH  -We will empirically place on Revatio 20 3 times daily for  now   COPD   - continue typical COPD care path    - not in acute exacerbation    - albuterol PRN QID   - Symbicort/Dulera BID   - Incentive spirometery    - pulmonary rehab on outpatient   -MetaNeb to recruit atelectatic segments   -arterial blood gas evaluation     OSA with OHS overlap   - Will order bedside spirometry to evaluate for NIV needs  -transitional care management consultation   - Home health /PT evaluation     Thank you for allowing me to participate in the care of this patient.    Patient/Family are satisfied with care plan and all questions have been answered.  This document was prepared using Dragon voice recognition software and may include unintentional dictation errors.     [name], M.D.  Division of Pulmonary & Critical Care Medicine  [facility]

## 2020-03-19 NOTE — Progress Notes (Addendum)
Patient ID: Charles Rangel, male   DOB: 1976/07/01, 44 y.o.   MRN: [id number] [facility] PROGRESS NOTE  Aydon Swamy WLN:[id number] DOB: [birth year] DOA: 03/18/2020 PCP: [facility] [place]  HPI/Subjective: Patient feeling fatigued and tired.  He is having trouble breathing with some shortness of breath and wheezing.  He does not wear oxygen at home.  He supposed to wear a CPAP from the [place] but it is broken.  Normally he can walk without being short of breath but currently is very short of breath if he moves around.  Patient states he is not urinating as much as he used to.  Objective: Vitals:   03/19/20 0508 03/19/20 0736  BP: (!) 117/91 (!) 150/70  Pulse: 89 96  Resp: 20 14  Temp: 97.7 F (36.5 C) 99 F (37.2 C)  SpO2: 98% 99%    Intake/Output Summary (Last 24 hours) at 03/19/2020 1311 Last data filed at 03/19/2020 0950 Gross per 24 hour  Intake 243 ml  Output 650 ml  Net -407 ml   Filed Weights   03/18/20 1244 03/18/20 1303 03/19/20 0508  Weight: (!) 145.2 kg (!) 145.5 kg (!) 147.2 kg    ROS: Review of Systems  Constitutional: Positive for malaise/fatigue. Negative for fever.  Eyes: Negative for blurred vision.  Respiratory: Positive for cough, shortness of breath and wheezing.   Cardiovascular: Negative for chest pain.  Gastrointestinal: Negative for abdominal pain, constipation, diarrhea, nausea and vomiting.  Genitourinary: Negative for dysuria.  Musculoskeletal: Negative for joint pain.  Neurological: Negative for dizziness and headaches.   Exam: Physical Exam  Constitutional: He is oriented to person, place, and time.  HENT:  Nose: No mucosal edema.  Mouth/Throat: No oropharyngeal exudate or posterior oropharyngeal edema.  Eyes: Conjunctivae and lids are normal.  Neck: Carotid bruit is not present.  Cardiovascular: S1 normal and S2 normal. Exam reveals no gallop.  No murmur heard. Respiratory: No respiratory distress. He has decreased breath  sounds in the right lower field and the left lower field. He has no wheezes. He has no rhonchi. He has rales in the right lower field.  Wheeze heard anteriorly but not posteriorly.  GI: Soft. Bowel sounds are normal. There is no abdominal tenderness.  Musculoskeletal:     Right ankle: Swelling present.     Left ankle: Swelling present.  Lymphadenopathy:    He has no cervical adenopathy.  Neurological: He is alert and oriented to person, place, and time. No cranial nerve deficit.  Skin: Skin is warm. No rash noted. Nails show no clubbing.  Psychiatric: He has a normal mood and affect.      Data Reviewed: Basic Metabolic Panel: Recent Labs  Lab 03/18/20 0843 03/19/20 0522  NA 141 141  K 3.5 4.1  CL 93* 92*  CO2 36* 39*  GLUCOSE 186* 128*  BUN 19 25*  CREATININE 1.38* 1.79*  CALCIUM 9.6 10.0   CBC: Recent Labs  Lab 03/18/20 0843  WBC 9.9  NEUTROABS 7.9*  HGB 14.4  HCT 46.6  MCV 80.2  PLT 292   BNP (last 3 results) Recent Labs    12/22/19 0755 03/18/20 0843  BNP 232.0* 68.1     CBG: Recent Labs  Lab 03/18/20 1143 03/18/20 1718 03/18/20 2040 03/19/20 0811 03/19/20 1145  GLUCAP 143* 173* 159* 114* 135*    Recent Results (from the past 240 hour(s))  SARS Coronavirus 2 by RT PCR (hospital order, performed in [facility] hospital lab) Nasopharyngeal Nasopharyngeal Swab  Status: None   Collection Time: 03/18/20  9:44 AM   Specimen: Nasopharyngeal Swab  Result Value Ref Range Status   SARS Coronavirus 2 NEGATIVE NEGATIVE Final    Comment: (NOTE) SARS-CoV-2 target nucleic acids are NOT DETECTED. The SARS-CoV-2 RNA is generally detectable in upper and lower respiratory specimens during the acute phase of infection. The lowest concentration of SARS-CoV-2 viral copies this assay can detect is 250 copies / mL. A negative result does not preclude SARS-CoV-2 infection and should not be used as the sole basis for treatment or other patient management  decisions.  A negative result may occur with improper specimen collection / handling, submission of specimen other than nasopharyngeal swab, presence of viral mutation(s) within the areas targeted by this assay, and inadequate number of viral copies (<250 copies / mL). A negative result must be combined with clinical observations, patient history, and epidemiological information. Fact Sheet for Patients:   BoilerBrush.com.cy Fact Sheet for Healthcare Providers: https://pope.com/ This test is not yet approved or cleared  by the [place] FDA and has been authorized for detection and/or diagnosis of SARS-CoV-2 by FDA under an Emergency Use Authorization (EUA).  This EUA will remain in effect (meaning this test can be used) for the duration of the COVID-19 declaration under Section 564(b)(1) of the Act, 21 U.S.C. section 360bbb-3(b)(1), unless the authorization is terminated or revoked sooner. Performed at [facility], [address] [place], [place] [address]      Studies: DG Chest Portable 1 View  Result Date: 03/18/2020 CLINICAL DATA:  Shortness of breath EXAM: PORTABLE CHEST 1 VIEW COMPARISON:  December 22, 2019 chest radiograph and chest CT FINDINGS: There is cardiomegaly with prominence of the main pulmonary artery suggesting a degree of underlying pulmonary arterial hypertension. There is interstitial prominence, likely due to a degree of vascular congestion. No edema or airspace opacity evident. No adenopathy. No bone lesions. IMPRESSION: Cardiomegaly with apparent degree of pulmonary arterial hypertension and vascular congestion. No frank edema or airspace consolidation. Appearance similar to most recent prior studies. Electronically Signed   By: Bretta Bang III M.D.   On: 03/18/2020 08:36    Scheduled Meds: . allopurinol  100 mg Oral Daily  . aspirin EC  81 mg Oral Daily  . carvedilol  25 mg Oral BID WC  .  cholecalciferol  4,000 Units Oral Daily  . colchicine  0.3 mg Oral QODAY  . enoxaparin (LOVENOX) injection  40 mg Subcutaneous Q12H  . furosemide  60 mg Intravenous BID  . insulin aspart  0-20 Units Subcutaneous TID WC  . methylPREDNISolone (SOLU-MEDROL) injection  40 mg Intravenous Daily  . multivitamin with minerals  1 tablet Oral Daily  . sodium chloride flush  3 mL Intravenous Q12H   Continuous Infusions: . sodium chloride      Assessment/Plan:  1. Acute on chronic diastolic congestive heart failure.  Since he is not urinating much we will increase Lasix to 60 mg IV twice daily.  Patient on Coreg. 2. COPD exacerbation start low-dose Solu-Medrol. 3. Pulmonary hypertension.  Will restart CPAP at night.  Continue Lasix and steroids.  Pulmonary consultation.  Transitional care team to see if we can get him a functional CPAP at night. 4. 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  On sliding scale insulin.  Watch kidney function closely with diuresis.  Creatinine bumped up a little bit today. 5. Morbid obesity with a BMI of 52.39.  Weight loss needed 6. Gout on  allopurinol  Code Status:     Code Status Orders  (From admission, onward)         Start     Ordered   03/18/20 1057  Full code  Continuous     03/18/20 1059        Code Status History    Date Active Date Inactive Code Status Order ID Comments User Context   12/22/2019 1121 12/25/2019 2352 Full Code [id number]  Lucile Shutters, MD ED   11/14/2018 1828 11/17/2018 1926 Full Code [id number]  Enedina Finner, MD Inpatient   06/28/2018 2215 07/01/2018 2021 Full Code [id number]  Altamese Dilling, MD ED   03/04/2018 1928 03/07/2018 1730 Full Code [id number]  Shaune Pollack, MD ED   Advance Care Planning Activity     Family Communication: Spoke with wife at the bedside Disposition Plan: Status is: Inpatient   Dispo: The patient is from: Home              Anticipated d/c is to: Home              Anticipated d/c  date is: Likely will need another 2 to 3 days here in the hospital trying to get his respiratory status better.              Patient currently being treated for acute diastolic congestive heart failure, COPD exacerbation and pulmonary hypertension with IV Lasix and IV steroids.  Transitional care team to look into getting him a working CPAP at night at home.  Consultants:  Pulmonary  Cardiology  Time spent: 28 minutes  [employer]

## 2020-03-20 ENCOUNTER — Encounter
Admission: EM | Disposition: A | Discharge status: Home / Self Care | Payer: Self-pay | Source: Home / Self Care | Attending: Internal Medicine

## 2020-03-20 DIAGNOSIS — J9621 Acute and chronic respiratory failure with hypoxia: Secondary | ICD-10-CM | POA: Diagnosis present

## 2020-03-20 DIAGNOSIS — N182 Chronic kidney disease, stage 2 (mild): Secondary | ICD-10-CM

## 2020-03-20 HISTORY — PX: RIGHT HEART CATH: CATH118263

## 2020-03-20 LAB — BASIC METABOLIC PANEL
Anion gap: 13 (ref 5–15)
BUN: 38 mg/dL — ABNORMAL HIGH (ref 6–20)
CO2: 32 mmol/L (ref 22–32)
Calcium: 10.4 mg/dL — ABNORMAL HIGH (ref 8.9–10.3)
Chloride: 94 mmol/L — ABNORMAL LOW (ref 98–111)
Creatinine, Ser: 1.36 mg/dL — ABNORMAL HIGH (ref 0.61–1.24)
GFR calc Af Amer: >60 mL/min (ref >60)
GFR calc non Af Amer: >60 mL/min (ref >60)
Glucose, Bld: 171 mg/dL — ABNORMAL HIGH (ref 70–99)
Potassium: 4.2 mmol/L (ref 3.5–5.1)
Sodium: 139 mmol/L (ref 135–145)

## 2020-03-20 LAB — HEMOGLOBIN A1C
Hgb A1c MFr Bld: 5.5 % (ref 4.8–5.6)
Mean Plasma Glucose: 111 mg/dL

## 2020-03-20 LAB — GLUCOSE, CAPILLARY
Glucose-Capillary: 156 mg/dL — ABNORMAL HIGH (ref 70–99)
Glucose-Capillary: 137 mg/dL — ABNORMAL HIGH (ref 70–99)
Glucose-Capillary: 273 mg/dL — ABNORMAL HIGH (ref 70–99)
Glucose-Capillary: 268 mg/dL — ABNORMAL HIGH (ref 70–99)
Glucose-Capillary: 210 mg/dL — ABNORMAL HIGH (ref 70–99)
Glucose-Capillary: 224 mg/dL — ABNORMAL HIGH (ref 70–99)

## 2020-03-20 LAB — TSH: TSH: 2.305 u[IU]/mL (ref 0.350–4.500)

## 2020-03-20 SURGERY — RIGHT HEART CATH
Anesthesia: Moderate Sedation

## 2020-03-20 MED ORDER — HEPARIN (PORCINE) IN NACL 1000-0.9 UT/500ML-% IV SOLN
INTRAVENOUS | Status: DC | PRN
  Administered 2020-03-20: 500 mL

## 2020-03-20 MED ORDER — SODIUM CHLORIDE 0.9% FLUSH: 3.0000 mL | INTRAVENOUS | Status: DC | PRN

## 2020-03-20 MED ORDER — SILDENAFIL CITRATE 20 MG PO TABS
20.0000 mg | ORAL_TABLET | Freq: Three times a day (TID) | ORAL | Status: DC
  Administered 2020-03-20 – 2020-03-23 (×8): 20 mg via ORAL
  Filled 2020-03-20 (×10): qty 1

## 2020-03-20 MED ORDER — SODIUM CHLORIDE 0.9 % IV SOLN: 250.0000 mL | INTRAVENOUS | Status: DC | PRN

## 2020-03-20 MED ORDER — SODIUM CHLORIDE 0.9% FLUSH: 3.0000 mL | Freq: Two times a day (BID) | INTRAVENOUS | Status: DC

## 2020-03-20 MED ORDER — IPRATROPIUM-ALBUTEROL 0.5-2.5 (3) MG/3ML IN SOLN
3.0000 mL | Freq: Four times a day (QID) | RESPIRATORY_TRACT | Status: DC
  Administered 2020-03-20 – 2020-03-22 (×6): 3 mL via RESPIRATORY_TRACT
  Filled 2020-03-20 (×7): qty 3

## 2020-03-20 MED ORDER — ENOXAPARIN SODIUM 40 MG/0.4ML ~~LOC~~ SOLN
40.0000 mg | Freq: Two times a day (BID) | SUBCUTANEOUS | Status: DC
  Administered 2020-03-21 – 2020-03-23 (×4): 40 mg via SUBCUTANEOUS
  Filled 2020-03-20 (×5): qty 0.4

## 2020-03-20 MED ORDER — FUROSEMIDE 10 MG/ML IJ SOLN
80.0000 mg | Freq: Two times a day (BID) | INTRAMUSCULAR | Status: DC
  Administered 2020-03-20 – 2020-03-23 (×6): 80 mg via INTRAVENOUS
  Filled 2020-03-20 (×6): qty 8

## 2020-03-20 MED ORDER — HYDRALAZINE HCL 20 MG/ML IJ SOLN: 10.0000 mg | INTRAMUSCULAR | Status: AC | PRN

## 2020-03-20 MED ORDER — SODIUM CHLORIDE 0.9 % IV SOLN: INTRAVENOUS | Status: DC

## 2020-03-20 MED ORDER — LABETALOL HCL 5 MG/ML IV SOLN: 10.0000 mg | INTRAVENOUS | Status: AC | PRN

## 2020-03-20 MED ORDER — SODIUM CHLORIDE 0.9% FLUSH
3.0000 mL | Freq: Two times a day (BID) | INTRAVENOUS | Status: DC
  Administered 2020-03-20 – 2020-03-23 (×6): 3 mL via INTRAVENOUS

## 2020-03-20 SURGICAL SUPPLY — 4 items
CATH BALLN WEDGE 5F 110CM (CATHETERS) ×2 IMPLANT
KIT MANI 3VAL PERCEP (MISCELLANEOUS) ×3 IMPLANT
PACK CARDIAC CATH (CUSTOM PROCEDURE TRAY) ×2 IMPLANT
SHEATH GLIDE SLENDER 4/5FR (SHEATH) ×2 IMPLANT

## 2020-03-20 NOTE — Clinical Social Work Note (Signed)
CSW met with pt at bedside. Pt states he has a scale at home. Pt is aware he needs to weigh himself daily and any significant weight gain pt is to notify MD. Pt sees a Cardiologist at the [facility]. Pt drives himself to appointments. Pt uses [employer] and PCP. Pt is agreeable to NIV. Referral provided to [facility] with [facility]. Pt works for [employer].  [place], [place]

## 2020-03-20 NOTE — Progress Notes (Signed)
Patient ID: Priest Lockridge, male   DOB: 1975-11-28, 44 y.o.   MRN: [id number] [facility] PROGRESS NOTE  Rafeal Skibicki BPZ:[id number] DOB: 10-27-1975 DOA: 03/18/2020 [facility]CP: [facility]  HPI/Subjective: Patient still feeling weak and tired.  Some cough.  Still with some shortness of breath.  This morning he said he has not been urinating like he normally does.  Objective: Vitals:   03/20/20 0847 03/20/20 1135  BP: (!) 136/96 (!) 154/115  Pulse: 87 94  Resp: 17 17  Temp: 97.6 F (36.4 C) 97.6 F (36.4 C)  SpO2: 94% 95%    Intake/Output Summary (Last 24 hours) at 03/20/2020 1339 Last data filed at 03/20/2020 1135 Gross per 24 hour  Intake 480 ml  Output 2125 ml  Net -1645 ml   Filed Weights   03/18/20 1303 03/19/20 0508 03/20/20 0447  Weight: (!) 145.5 kg (!) 147.2 kg (!) 146.3 kg    ROS: Review of Systems  Constitutional: Negative for fever.  Eyes: Negative for blurred vision.  Respiratory: Positive for cough and shortness of breath.   Cardiovascular: Negative for chest pain.  Gastrointestinal: Negative for abdominal pain, constipation, diarrhea, nausea and vomiting.  Genitourinary: Negative for dysuria.  Musculoskeletal: Negative for joint pain.  Neurological: Negative for dizziness.   Exam: Physical Exam  HENT:  Nose: No mucosal edema.  Mouth/Throat: No oropharyngeal exudate or posterior oropharyngeal edema.  Eyes: Conjunctivae and lids are normal.  Neck: No JVD present. Carotid bruit is not present. No thyroid mass and no thyromegaly present.  Cardiovascular: S1 normal and S2 normal. Exam reveals no gallop.  No murmur heard. Respiratory: No respiratory distress. He has decreased breath sounds in the right lower field and the left lower field. He has no wheezes. He has rhonchi in the right lower field and the left lower field. He has no rales.  GI: Soft. Bowel sounds are normal. There is no abdominal tenderness.  Musculoskeletal:     Cervical  back: No edema.     Right ankle: Swelling present.     Left ankle: Swelling present.  Lymphadenopathy:    He has no cervical adenopathy.  Neurological: He is alert. No cranial nerve deficit.  Skin: Skin is warm. No rash noted. Nails show no clubbing.  Psychiatric: He has a normal mood and affect.      Data Reviewed: Basic Metabolic Panel: Recent Labs  Lab 03/18/20 0843 03/19/20 0522 03/20/20 0723  NA 141 141 139  K 3.5 4.1 4.2  CL 93* 92* 94*  CO2 36* 39* 32  GLUCOSE 186* 128* 171*  BUN 19 25* 38*  CREATININE 1.38* 1.79* 1.36*  CALCIUM 9.6 10.0 10.4*   CBC: Recent Labs  Lab 03/18/20 0843  WBC 9.9  NEUTROABS 7.9*  HGB 14.4  HCT 46.6  MCV 80.2  PLT 292   BNP (last 3 results) Recent Labs    12/22/19 0755 03/18/20 0843  BNP 232.0* 68.1    CBG: Recent Labs  Lab 03/19/20 1145 03/19/20 1619 03/19/20 2041 03/20/20 0845 03/20/20 1132  GLUCAP 135* 238* 266* 156* 137*    Recent Results (from the past 240 hour(s))  SARS Coronavirus 2 by RT PCR (hospital order, performed in [facility] hospital lab) Nasopharyngeal Nasopharyngeal Swab     Status: None   Collection Time: 03/18/20  9:44 AM   Specimen: Nasopharyngeal Swab  Result Value Ref Range Status   SARS Coronavirus 2 NEGATIVE NEGATIVE Final    Comment: (NOTE) SARS-CoV-2 target nucleic acids are NOT  DETECTED. The SARS-CoV-2 RNA is generally detectable in upper and lower respiratory specimens during the acute phase of infection. The lowest concentration of SARS-CoV-2 viral copies this assay can detect is 250 copies / mL. A negative result does not preclude SARS-CoV-2 infection and should not be used as the sole basis for treatment or other patient management decisions.  A negative result may occur with improper specimen collection / handling, submission of specimen other than nasopharyngeal swab, presence of viral mutation(s) within the areas targeted by this assay, and inadequate number of viral  copies (<250 copies / mL). A negative result must be combined with clinical observations, patient history, and epidemiological information. Fact Sheet for Patients:   StrictlyIdeas.no Fact Sheet for Healthcare Providers: BankingDealers.co.za This test is not yet approved or cleared  by the [place] FDA and has been authorized for detection and/or diagnosis of SARS-CoV-2 by FDA under an Emergency Use Authorization (EUA).  This EUA will remain in effect (meaning this test can be used) for the duration of the COVID-19 declaration under Section 564(b)(1) of the Act, 21 U.S.C. section 360bbb-3(b)(1), unless the authorization is terminated or revoked sooner. Performed at [facility], [address] [place], [place] [address]      Scheduled Meds: . allopurinol  100 mg Oral Daily  . aspirin EC  81 mg Oral Daily  . carvedilol  25 mg Oral BID WC  . cholecalciferol  4,000 Units Oral Daily  . colchicine  0.3 mg Oral QODAY  . furosemide  60 mg Intravenous BID  . insulin aspart  0-20 Units Subcutaneous TID WC  . methylPREDNISolone (SOLU-MEDROL) injection  40 mg Intravenous Daily  . multivitamin with minerals  1 tablet Oral Daily  . sodium chloride flush  3 mL Intravenous Q12H   Continuous Infusions: . sodium chloride      Assessment/Plan:  1. Acute on chronic hypoxic hypercarbic respiratory failure.  Transitional care team looking into noninvasive ventilation at night.  I will order BiPAP while here. 2. COPD exacerbation.  Continue low-dose Solu-Medrol and nebulizer treatments. 3. Pulmonary hypertension.  BiPAP at night.  Continue Lasix and steroids.  Cardiology to consider a right heart cath. 4. Acute on chronic diastolic congestive heart failure on Lasix 60 mg IV twice daily and Coreg. 5. Type 2 diabetes mellitus with chronic kidney disease stage II. Watch closely with diuresis. 6. Hypercalcemia.  Send off a PTH and PTH RP.   TSH normal range.  Send off vitamin D level. 7. Morbid obesity with a BMI of 52.06 8. Gout on allopurinol      Code Status:     Code Status Orders  (From admission, onward)         Start     Ordered   03/18/20 1057  Full code  Continuous     03/18/20 1059        Code Status History    Date Active Date Inactive Code Status Order ID Comments User Context   12/22/2019 1121 12/25/2019 2352 Full Code [id number]  Collier Bullock, MD ED   11/14/2018 1828 11/17/2018 1926 Full Code [id number]  Fritzi Mandes, MD Inpatient   06/28/2018 2215 07/01/2018 2021 Full Code [id number]  Vaughan Basta, MD ED   03/04/2018 1928 03/07/2018 1730 Full Code [id number]  Demetrios Loll, MD ED   Advance Care Planning Activity     Family Communication: Spoke with wife on the phone Disposition Plan: Status is: Inpatient  Dispo: The patient is from: Home  Anticipated d/c is to: Home              Anticipated d/c date is: Likely will need 2 to 3 days here in the hospital with IV Lasix.              Patient currently worked up for pulmonary hypertension and being treated for COPD and CHF exacerbations.  Consultants:  Cardiology  Pulmonary  Time spent: 28 minutes  [employer]rian [employer]

## 2020-03-20 NOTE — Consult Note (Signed)
Cardiology Consultation:   Patient ID: Charles HowellsKendrick Susan; [id number]; Oct 09, 1976   Admit date: 03/18/2020 Date of Consult: 03/20/2020  [facility] Provider: [facility] Primary Cardiologist: Kirke CorinArida Primary Electrophysiologist: None   Patient Profile:   Charles Rangel is a 44 y.o. male with a hx of HFpEF, pulmonary hypertension, COPD, DM2, remote DVT of the right lower extremity in the setting of prolonged travel, remote tobacco use, HTN, morbid obesity, OSA with intermittent adherence of CPAP, and gout  who is being seen today for the evaluation of pulmonary hypertension at the request of Dr. Karna ChristmasAleskerov.  History of Present Illness:   Charles Rangel was admitted to the hospital in 02/2018 with acute on chronic respiratory failure in the setting of COPD exacerbation and mild CHF.  Echo showed normal LV systolic function.  Chronically, [facility] is [facility].  He was admitted in 06/2018 with recurrent heart failure exacerbation and gout.  He was readmitted to [facility] in 10/2018 with acute on chronic diastolic CHF in the setting of dietary indiscretion and running out of medications.  Echo during that admission showed an EF of 60 to 65%, mild LVH, diastolic dysfunction, and no significant valvular abnormalities.  He has been [facility] as [facility].  He was seen in the ED in 08/2019 with pleuritic chest pain.  CTA chest was negative for PE.  There was suspicion for possible COVID-19 infection, though swab was negative.  High-sensitivity troponin negative x2.  EKG showed sinus rhythm with no acute ST-T changes.  He was advised to follow-up as an outpatient.  He was most recently admitted to the hospital in 12/2019 with acute on chronic HFpEF and severe pulmonary hypertension requiring IV diuresis with symptom improvement.  CTA of the chest was negative for PE with markedly dilated main pulmonary artery suggesting severe chronic pulmonary  arterial hypertension.  Echo showed an EF of 60 to 65%, no regional wall motion abnormalities, moderate LVH, indeterminate LV diastolic function parameters, normal RV systolic function and right ventricular cavity size, mildly dilated left atrium.  Outpatient follow-up with his cardiologist through the [facility] was recommended.  Unfortunately, this was not completed.  [facility] with a 1 week history of increased dyspnea that has been progressively getting worse.  He reported at baseline he has been taking Lasix 80 mg once daily for the past 3 to 4 days has been doubling up on this without much improvement in his symptoms.  He reports a 4 pound weight gain several days prior to his admission with an admission weight somewhere between 144.2 kg to 147.2 kg and a current weight of 146.3 kg.  At his last discharge in 12/2019 his documented weight was 144.1 kg.  Chest x-ray showed cardiomegaly with vascular congestion and an apparent degree of pulmonary arterial hypertension with no frank edema or airspace consolidation.  Appearance similar to prior studies.  Covid negative.  High-sensitivity troponin of 19 with a delta of 21.  BNP 68.1.  He was consulted up to pulmonology with recommendation for empiric sildenafil, not yet started, and RHC.  IV Lasix was titrated from 40 mg twice daily to 60 mg twice daily on 5/31.  Documented urine output of 405 mL for the past 24 hours with a net -1.7 L for the admission.    Past Medical History:  Diagnosis Date  . (HFpEF) heart failure with preserved ejection fraction (HCC)  a. 02/2018 Echo: EF 65-70%, mildly dil LA.  . CHF (congestive heart failure) (HCC)   . COPD (chronic obstructive pulmonary disease) (HCC)   . Diabetes mellitus without complication (HCC)   . DVT (deep venous thrombosis) (HCC)   . Gout   . Hypertension   . Morbid obesity (HCC)   . OSA (obstructive sleep apnea)     Past Surgical History:  Procedure Laterality Date  .  Left leg surgery for fracture       Home Meds: Prior to Admission medications   Medication Sig Start Date End Date Taking? Authorizing Provider  albuterol (VENTOLIN HFA) 108 (90 Base) MCG/ACT inhaler Inhale 2 puffs into the lungs every 6 (six) hours as needed for wheezing or shortness of breath. 08/27/19  Yes Chesley Noon, MD  allopurinol (ZYLOPRIM) 100 MG tablet Take 1 tablet (100 mg total) by mouth daily. 11/18/18  Yes Lule, Joana, PA  aspirin EC 81 MG tablet Take 81 mg daily by mouth.   Yes [provider]  bumetanide (BUMEX) 2 MG tablet Take 2 mg by mouth 2 (two) times daily.   Yes [provider]  carvedilol (COREG) 25 MG tablet Take 1 tablet (25 mg total) by mouth 2 (two) times daily with a meal. 12/25/19 03/24/20 Yes Darlin Priestly, MD  Cholecalciferol (VITAMIN D3) 1000 units CAPS Take 4,000 Units by mouth daily.    Yes [provider]  colchicine 0.6 MG tablet Take 0.5 tablets (0.3 mg total) by mouth every other day. 12/25/19  Yes Darlin Priestly, MD  lisinopril (ZESTRIL) 40 MG tablet Take 40 mg by mouth daily.   Yes [provider]  metFORMIN (GLUCOPHAGE) 500 MG tablet Take 1,000 mg by mouth 2 (two) times daily with a meal.    Yes [provider]  Multiple Vitamin (MULTIVITAMIN WITH MINERALS) TABS tablet Take 1 tablet by mouth daily.   Yes [provider]  furosemide (LASIX) 80 MG tablet Take 1 tablet (80 mg total) by mouth 2 (two) times daily. Your new water pills. Patient not taking: Reported on 03/18/2020 12/25/19 03/24/20  Darlin Priestly, MD    Inpatient Medications: Scheduled Meds: . allopurinol  100 mg Oral Daily  . aspirin EC  81 mg Oral Daily  . carvedilol  25 mg Oral BID WC  . cholecalciferol  4,000 Units Oral Daily  . colchicine  0.3 mg Oral QODAY  . furosemide  60 mg Intravenous BID  . insulin aspart  0-20 Units Subcutaneous TID WC  . methylPREDNISolone (SOLU-MEDROL) injection  40 mg Intravenous Daily  . multivitamin with minerals  1  tablet Oral Daily  . sodium chloride flush  3 mL Intravenous Q12H   Continuous Infusions: . sodium chloride     PRN Meds: sodium chloride, ipratropium-albuterol, ondansetron (ZOFRAN) IV, sodium chloride flush, traMADol  Allergies:   Allergies  Allergen Reactions  . Tylenol [Acetaminophen] Anaphylaxis  . Dog Epithelium Allergy Skin Test Hives  . Apple Hives  . Other Hives    RAISINS    Social History:   Social History   Socioeconomic History  . Marital status: Married    Spouse name: Not on file  . Number of children: Not on file  . Years of education: Not on file  . Highest education level: Not on file  Occupational History  . Not on file  Tobacco Use  . Smoking status: Former Smoker    Packs/day: 1.00    Years: 20.00    Pack years: 20.00  Quit date: 10/20/2010    Years since quitting: 9.4  . Smokeless tobacco: Never Used  Substance and Sexual Activity  . Alcohol use: Not Currently  . Drug use: No  . Sexual activity: Not on file  Other Topics Concern  . Not on file  Social History Narrative   Lives in [place] with his wife.  Exercises @ the gym 4-5x/wk - treadmill @ 2-2.5 mph x 1 hr.   Social Determinants of Health   Financial Resource Strain:   . Difficulty of Paying Living Expenses:   Food Insecurity:   . Worried About Programme researcher, broadcasting/film/video in the Last Year:   . Barista in the Last Year:   Transportation Needs:   . Freight forwarder (Medical):   Marland Kitchen Lack of Transportation (Non-Medical):   Physical Activity:   . Days of Exercise per Week:   . Minutes of Exercise per Session:   Stress:   . Feeling of Stress :   Social Connections:   . Frequency of Communication with Friends and Family:   . Frequency of Social Gatherings with Friends and Family:   . Attends Religious Services:   . Active Member of Clubs or Organizations:   . Attends Banker Meetings:   Marland Kitchen Marital Status:   Intimate Partner Violence:   . Fear of Current or  Ex-Partner:   . Emotionally Abused:   Marland Kitchen Physically Abused:   . Sexually Abused:      Family History:   Family History  Problem Relation Age of Onset  . Hypertension Mother   . Hypertension Father   . CVA Father 45    ROS:  Review of Systems  Constitutional: Positive for malaise/fatigue. Negative for chills, diaphoresis, fever and weight loss.  HENT: Negative for congestion.   Eyes: Negative for discharge and redness.  Respiratory: Positive for shortness of breath. Negative for cough, sputum production and wheezing.   Cardiovascular: Positive for orthopnea and leg swelling. Negative for chest pain, palpitations, claudication and PND.  Gastrointestinal: Negative for abdominal pain, heartburn, nausea and vomiting.  Musculoskeletal: Negative for falls and myalgias.  Skin: Negative for rash.  Neurological: Positive for weakness. Negative for dizziness, tingling, tremors, sensory change, speech change, focal weakness and loss of consciousness.  Endo/Heme/Allergies: Does not bruise/bleed easily.  Psychiatric/Behavioral: Negative for substance abuse. The patient is not nervous/anxious.   All other systems reviewed and are negative.     Physical Exam/Data:   Vitals:   03/19/20 2027 03/19/20 2039 03/20/20 0447 03/20/20 0847  BP:  110/73 (!) 154/90 (!) 136/96  Pulse:  (!) 110 99 87  Resp:  20 20 17   Temp:  98.1 F (36.7 C)  97.6 F (36.4 C)  TempSrc:  Oral  Oral  SpO2: 98% 97% 90% 94%  Weight:   (!) 146.3 kg   Height:        Intake/Output Summary (Last 24 hours) at 03/20/2020 1018 Last data filed at 03/19/2020 2042 Gross per 24 hour  Intake 480 ml  Output 1125 ml  Net -645 ml   Filed Weights   03/18/20 1303 03/19/20 0508 03/20/20 0447  Weight: (!) 145.5 kg (!) 147.2 kg (!) 146.3 kg   Body mass index is 52.06 kg/m.   Physical Exam: General: Well developed, well nourished, in no acute distress. Head: Normocephalic, atraumatic, sclera non-icteric, no xanthomas, nares  without discharge.  Neck: Negative for carotid bruits. JVD difficult to assess secondary to body habitus. Lungs: Diminished breath sounds  bilaterally. Breathing is unlabored. Heart: RRR with S1 S2. No murmurs, rubs, or gallops appreciated. Abdomen: Morbidly obese, soft, non-tender, non-distended with normoactive bowel sounds. No hepatomegaly. No rebound/guarding. No obvious abdominal masses. Msk:  Strength and tone appear normal for age. Extremities: No clubbing or cyanosis.  1-2+ bilateral lower extremity pitting edema. Distal pedal pulses are 2+ and equal bilaterally. Neuro: Alert and oriented X 3. No facial asymmetry. No focal deficit. Moves all extremities spontaneously. Psych:  Responds to questions appropriately with a normal affect.   EKG:  The EKG was personally reviewed and demonstrates: Sinus tachycardia, 118 bpm, poor R wave progression along the precordial leads, underlying artifact, no acute ST-T changes Telemetry:  Telemetry was personally reviewed and demonstrates: Sinus tachycardia, low 100s bpm  Weights: Filed Weights   03/18/20 1303 03/19/20 0508 03/20/20 0447  Weight: (!) 145.5 kg (!) 147.2 kg (!) 146.3 kg    Relevant CV Studies:  2D echo 12/22/2019: 1. Left ventricular ejection fraction, by estimation, is 60 to 65%. The  left ventricle has normal function. The left ventricle has no regional  wall motion abnormalities. There is moderate left ventricular hypertrophy.  Left ventricular diastolic  parameters are indeterminate.  2. Right ventricular systolic function is normal. The right ventricular  size is normal.  3. Left atrial size was mildly dilated.  Laboratory Data:  Chemistry Recent Labs  Lab 03/18/20 0843 03/19/20 0522 03/20/20 0723  NA 141 141 139  K 3.5 4.1 4.2  CL 93* 92* 94*  CO2 36* 39* 32  GLUCOSE 186* 128* 171*  BUN 19 25* 38*  CREATININE 1.38* 1.79* 1.36*  CALCIUM 9.6 10.0 10.4*  GFRNONAA >60 45* >60  GFRAA >60 52* >60  ANIONGAP 12 10  13     No results for input(s): PROT, ALBUMIN, AST, ALT, ALKPHOS, BILITOT in the last 168 hours. Hematology Recent Labs  Lab 03/18/20 0843  WBC 9.9  RBC 5.81  HGB 14.4  HCT 46.6  MCV 80.2  MCH 24.8*  MCHC 30.9  RDW 18.1*  PLT 292   Cardiac EnzymesNo results for input(s): TROPONINI in the last 168 hours. No results for input(s): TROPIPOC in the last 168 hours.  BNP Recent Labs  Lab 03/18/20 0843  BNP 68.1    DDimer No results for input(s): DDIMER in the last 168 hours.  Radiology/Studies:  DG Chest Portable 1 View  Result Date: 03/18/2020 IMPRESSION: Cardiomegaly with apparent degree of pulmonary arterial hypertension and vascular congestion. No frank edema or airspace consolidation. Appearance similar to most recent prior studies. Electronically Signed   By: Lowella Grip III M.D.   On: 03/18/2020 08:36    Assessment and Plan:   1.  Acute on chronic HFpEF/severe pulmonary hypertension: -Prior CTA of the chest from 12/2019 negative for PE -With the patient's history of prior DVT consider VQ scan to evaluate for chronic PE -Likely multifactorial etiology including underlying COPD, HFpEF, morbid obesity with OSA and likely OHS along with noncompliance with medical therapy -RHC -Continue IV Lasix 60 mg twice daily with further recommendations pending hemodynamics noted on RHC -Discuss empiric sildenafil with pulmonology -Risks and benefits of cardiac catheterization have been discussed with the patient including risks of bleeding, bruising, infection, stroke, heart attack, injury to a limb, and death. The patient understands these risks and is willing to proceed with the procedure. All questions have been answered and concerns listened to -Daily weights -Strict I's and O's  2.  Elevated high-sensitivity troponin: -Minimal elevated and not consistent with  ACS -No symptoms of chest pain -No plans for inpatient ischemic evaluation at this time  3.  Acute on CKD stage  II: -Improving -Monitor with diuresis  4.  HTN: -Improved -Continue current therapy  5.  Morbid obesity with OSA and likely OHS: -Weight loss advised -Compliance with CPAP recommended     For questions or updates, please contact [facility] Please consult [email or web address] for contact info under Cardiology/STEMI.   Signed, Eula Listen, PA-C [facility] Pager: [phone] 03/20/2020, 10:18 AM

## 2020-03-20 NOTE — Clinical Social Work Note (Deleted)
Patient continues to exhibit signs of hypercapnia associated with chronic respiratory failure secondary to severe COPD.  Interruption or failure to provide NIV would quickly lead to exacerbation of the patient's condition, hospital admission, and likely harm to the patient. Continued use is preferred.  The use of the NIV will treat patient's high PC02 levels and can reduce risk of exacerbations and future hospitalizations when used at night and during the day.  BiLevel/RAD has been tried previously and has proven ineffective at managing this patient's hypercapnia.  Ventilation is required to decrease the work of breathing and improve pulmonary status. Interruption of ventilator support would lead to decline of health status.  Patient is able to protect their airways and clear secretions on their own.    

## 2020-03-20 NOTE — Consult Note (Signed)
Pulmonary Medicine          Date: 03/20/2020,   MRN# [id number] Charles Rangel July 11, 1976     AdmissionWeight: (!) 144.2 kg                 CurrentWeight: (!) 146.3 kg  Referring physician: Dr. Radene Gunning    CHIEF COMPLAINT:   New onset pulmonary hypertension   SUBJECTIVE    Patient is clinically improved.  He had Right heart cath showing pulmonary hypertension which is mostly isolated post capillary in etiology.    I have discussed care plan with patient and wife, answered questions.    PAST MEDICAL HISTORY   Past Medical History:  Diagnosis Date  . (HFpEF) heart failure with preserved ejection fraction (HCC)    a. 02/2018 Echo: EF 65-70%, mildly dil LA.  . CHF (congestive heart failure) (HCC)   . COPD (chronic obstructive pulmonary disease) (HCC)   . Diabetes mellitus without complication (HCC)   . DVT (deep venous thrombosis) (HCC)   . Gout   . Hypertension   . Morbid obesity (HCC)   . OSA (obstructive sleep apnea)      SURGICAL HISTORY   Past Surgical History:  Procedure Laterality Date  . Left leg surgery for fracture       FAMILY HISTORY   Family History  Problem Relation Age of Onset  . Hypertension Mother   . Hypertension Father   . CVA Father 62     SOCIAL HISTORY   Social History   Tobacco Use  . Smoking status: Former Smoker    Packs/day: 1.00    Years: 20.00    Pack years: 20.00    Quit date: 10/20/2010    Years since quitting: 9.4  . Smokeless tobacco: Never Used  Substance Use Topics  . Alcohol use: Not Currently  . Drug use: No     MEDICATIONS    Home Medication:    Current Medication:  Current Facility-Administered Medications:  .  0.9 %  sodium chloride infusion, 250 mL, Intravenous, PRN, End, [name], MD .  0.9 %  sodium chloride infusion, 250 mL, Intravenous, PRN, End, [name], MD .  allopurinol (ZYLOPRIM) tablet 100 mg, 100 mg, Oral, Daily, End, [name], MD, 100 mg at 03/20/20 0921 .   aspirin EC tablet 81 mg, 81 mg, Oral, Daily, End, [name], MD, 81 mg at 03/20/20 1001 .  carvedilol (COREG) tablet 25 mg, 25 mg, Oral, BID WC, End, [name], MD, 25 mg at 03/20/20 1726 .  cholecalciferol (VITAMIN D3) tablet 4,000 Units, 4,000 Units, Oral, Daily, End, [name], MD, 4,000 Units at 03/20/20 1001 .  colchicine tablet 0.3 mg, 0.3 mg, Oral, QODAY, End, [name], MD, 0.3 mg at 03/20/20 1000 .  [START ON 03/21/2020] enoxaparin (LOVENOX) injection 40 mg, 40 mg, Subcutaneous, BID, End, [name], MD .  furosemide (LASIX) injection 80 mg, 80 mg, Intravenous, BID, End, [name], MD, 80 mg at 03/20/20 1726 .  hydrALAZINE (APRESOLINE) injection 10 mg, 10 mg, Intravenous, Q20 Min PRN, End, [name], MD .  insulin aspart (novoLOG) injection 0-20 Units, 0-20 Units, Subcutaneous, TID WC, End, [name], MD, 7 Units at 03/20/20 1725 .  ipratropium-albuterol (DUONEB) 0.5-2.5 (3) MG/3ML nebulizer solution 3 mL, 3 mL, Nebulization, Q6H, End, [name], MD .  labetalol (NORMODYNE) injection 10 mg, 10 mg, Intravenous, Q10 min PRN, End, [name], MD .  methylPREDNISolone sodium succinate (SOLU-MEDROL) 40 mg/mL injection 40 mg, 40 mg, Intravenous, Daily, End, [name], MD, 40 mg at 03/20/20 0920 .  multivitamin with minerals tablet 1 tablet, 1 tablet, Oral, Daily, End, [name], MD, 1 tablet at 03/20/20 0921 .  ondansetron (ZOFRAN) injection 4 mg, 4 mg, Intravenous, Q6H PRN, End, [name], MD .  sodium chloride flush (NS) 0.9 % injection 3 mL, 3 mL, Intravenous, Q12H, End, [name], MD, 3 mL at 03/20/20 1001 .  sodium chloride flush (NS) 0.9 % injection 3 mL, 3 mL, Intravenous, PRN, End, [name], MD .  sodium chloride flush (NS) 0.9 % injection 3 mL, 3 mL, Intravenous, Q12H, End, [name], MD .  sodium chloride flush (NS) 0.9 % injection 3 mL, 3 mL, Intravenous, PRN, End, Harrell Gave, MD .  traMADol Veatrice Bourbon) tablet 50 mg, 50 mg, Oral, Q6H PRN, End, [name],  MD, 50 mg at 03/18/20 2158    ALLERGIES   Tylenol [acetaminophen], Dog epithelium allergy skin test, Apple, and Other     REVIEW OF SYSTEMS    Review of Systems:  Gen:  Denies  fever, sweats, chills weigh loss  HEENT: Denies blurred vision, double vision, ear pain, eye pain, hearing loss, nose bleeds, sore throat Cardiac:  No dizziness, chest pain or heaviness, chest tightness,edema Resp:   Denies cough or sputum porduction, shortness of breath,wheezing, hemoptysis,  Gi: Denies swallowing difficulty, stomach pain, nausea or vomiting, diarrhea, constipation, bowel incontinence Gu:  Denies bladder incontinence, burning urine Ext:   Denies Joint pain, stiffness or swelling Skin: Denies  skin rash, easy bruising or bleeding or hives Endoc:  Denies polyuria, polydipsia , polyphagia or weight change Psych:   Denies depression, insomnia or hallucinations   Other:  All other systems negative   VS: BP 127/86 (BP Location: Left Arm)   Pulse (!) 107   Temp 97.8 F (36.6 C)   Resp 17   Ht 5\' 6"  (1.676 m)   Wt (!) 146.3 kg   SpO2 96%   BMI 52.06 kg/m      PHYSICAL EXAM    GENERAL:NAD, no fevers, chills, no weakness no fatigue HEAD: Normocephalic, atraumatic.  EYES: Pupils equal, round, reactive to light. Extraocular muscles intact. No scleral icterus.  MOUTH: Moist mucosal membrane. Dentition intact. No abscess noted.  EAR, NOSE, THROAT: Clear without exudates. No external lesions.  NECK: Supple. No thyromegaly. No nodules. No JVD.  PULMONARY: Mild crackles at bases bilaterally.  CARDIOVASCULAR: S1 and S2. Regular rate and rhythm. No murmurs, rubs, or gallops. No edema. Pedal pulses 2+ bilaterally.  GASTROINTESTINAL: Soft, nontender, nondistended. No masses. Positive bowel sounds. No hepatosplenomegaly.  MUSCULOSKELETAL: No swelling, clubbing, or edema. Range of motion full in all extremities.  NEUROLOGIC: Cranial nerves II through XII are intact. No gross focal  neurological deficits. Sensation intact. Reflexes intact.  SKIN: No ulceration, lesions, rashes, or cyanosis. Skin warm and dry. Turgor intact.  PSYCHIATRIC: Mood, affect within normal limits. The patient is awake, alert and oriented x 3. Insight, judgment intact.       IMAGING    CARDIAC CATHETERIZATION  Result Date: 03/20/2020 Conclusions: 1. Moderately elevated left heart filling pressure. 2. Severely elevated right heart and pulmonary artery pressures. 3. Normal Fick cardiac output/index. Recommendations: 1. Escalate diuresis. 2. Ongoing management of underlying lung disease (including OSA) per primary and pulmonary teams. Nelva Bush, MD [facility]   DG Chest Portable 1 View  Result Date: 03/18/2020 CLINICAL DATA:  Shortness of breath EXAM: PORTABLE CHEST 1 VIEW COMPARISON:  December 22, 2019 chest radiograph and chest CT FINDINGS: There is cardiomegaly with prominence of the main pulmonary artery suggesting a degree of  underlying pulmonary arterial hypertension. There is interstitial prominence, likely due to a degree of vascular congestion. No edema or airspace opacity evident. No adenopathy. No bone lesions. IMPRESSION: Cardiomegaly with apparent degree of pulmonary arterial hypertension and vascular congestion. No frank edema or airspace consolidation. Appearance similar to most recent prior studies. Electronically Signed   By: Bretta Bang III M.D.   On: 03/18/2020 08:36      ASSESSMENT/PLAN   Acute hypoxemic respiratory failure   -Multifactorial in etiology-including diastolic CHF,Moribd obesity,  severe obstructive sleep apnea, pickwickian physiology,deconditioning,  significant pulmonary hypertension of undetermined severity.  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    Patient/Family are satisfied with care plan and all questions have been answered.  This document was prepared using Dragon voice recognition software and may include unintentional dictation errors.     [name], M.D.  Division of Pulmonary & Critical Care Medicine  [facility]

## 2020-03-20 NOTE — Clinical Social Work Note (Signed)

## 2020-03-20 NOTE — Interval H&P Note (Signed)
History and Physical Interval Note:  03/20/2020 2:46 PM  Charles Rangel  has presented today for surgery, with the diagnosis of acute respiratory failure and pulmonary hypertension.  The various methods of treatment have been discussed with the patient and family. After consideration of risks, benefits and other options for treatment, the patient has consented to  Procedure(s): RIGHT HEART CATH (N/A) as a surgical intervention.  The patient's history has been reviewed, patient examined, no change in status, stable for surgery.  I have reviewed the patient's chart and labs.  Questions were answered to the patient's satisfaction.    Jahmeir Geisen

## 2020-03-20 NOTE — H&P (View-Only) (Signed)
   Cardiology Consultation:   Patient ID: Charles Rangel; [id number]; 05/29/1976   Admit date: 03/18/2020 Date of Consult: 03/20/2020  Primary Care Provider: [facility] Primary Cardiologist: Arida Primary Electrophysiologist: None   Patient Profile:   Charles Rangel is a 44 y.o. male with a hx of HFpEF, pulmonary hypertension, COPD, DM2, remote DVT of the right lower extremity in the setting of prolonged travel, remote tobacco use, HTN, morbid obesity, OSA with intermittent adherence of CPAP, and gout  who is being seen today for the evaluation of pulmonary hypertension at the request of Dr. Aleskerov.  History of Present Illness:   [name] was admitted to the hospital in 02/2018 with acute on chronic respiratory failure in the setting of COPD exacerbation and mild CHF.  Echo showed normal LV systolic function.  Chronically, he is followed at the [facility].  He was admitted in 06/2018 with recurrent heart failure exacerbation and gout.  He was readmitted to [facility] in 10/2018 with acute on chronic diastolic CHF in the setting of dietary indiscretion and running out of medications.  Echo during that admission showed an EF of 60 to 65%, mild LVH, diastolic dysfunction, and no significant valvular abnormalities.  He has been followed by the [place] [facility] as an outpatient.  He was seen in the ED in 08/2019 with pleuritic chest pain.  CTA chest was negative for PE.  There was suspicion for possible COVID-19 infection, though swab was negative.  High-sensitivity troponin negative x2.  EKG showed sinus rhythm with no acute ST-T changes.  He was advised to follow-up as an outpatient.  He was most recently admitted to the hospital in 12/2019 with acute on chronic HFpEF and severe pulmonary hypertension requiring IV diuresis with symptom improvement.  CTA of the chest was negative for PE with markedly dilated main pulmonary artery suggesting severe chronic pulmonary  arterial hypertension.  Echo showed an EF of 60 to 65%, no regional wall motion abnormalities, moderate LVH, indeterminate LV diastolic function parameters, normal RV systolic function and right ventricular cavity size, mildly dilated left atrium.  [facility].  Unfortunately, this was not completed.  He presented to [facility] on 5/30 with a 1 week history of increased dyspnea that has been progressively getting worse.  He reported at baseline he has been taking Lasix 80 mg once daily for the past 3 to 4 days has been doubling up on this without much improvement in his symptoms.  He reports a 4 pound weight gain several days prior to his admission with an admission weight somewhere between 144.2 kg to 147.2 kg and a current weight of 146.3 kg.  At his last discharge in 12/2019 his documented weight was 144.1 kg.  Chest x-ray showed cardiomegaly with vascular congestion and an apparent degree of pulmonary arterial hypertension with no frank edema or airspace consolidation.  Appearance similar to prior studies.  Covid negative.  High-sensitivity troponin of 19 with a delta of 21.  BNP 68.1.  He was consulted up to pulmonology with recommendation for empiric sildenafil, not yet started, and RHC.  IV Lasix was titrated from 40 mg twice daily to 60 mg twice daily on 5/31.  Documented urine output of 405 mL for the past 24 hours with a net -1.7 L for the admission.    Past Medical History:  Diagnosis Date  . (HFpEF) heart failure with preserved ejection fraction (HCC)      a. 02/2018 Echo: EF 65-70%, mildly dil LA.  . CHF (congestive heart failure) (HCC)   . COPD (chronic obstructive pulmonary disease) (HCC)   . Diabetes mellitus without complication (HCC)   . DVT (deep venous thrombosis) (HCC)   . Gout   . Hypertension   . Morbid obesity (HCC)   . OSA (obstructive sleep apnea)     Past Surgical History:  Procedure Laterality Date  .  Left leg surgery for fracture       Home Meds: Prior to Admission medications   Medication Sig Start Date End Date Taking? Authorizing Provider  albuterol (VENTOLIN HFA) 108 (90 Base) MCG/ACT inhaler Inhale 2 puffs into the lungs every 6 (six) hours as needed for wheezing or shortness of breath. 08/27/19  Yes Chesley Noon, MD  allopurinol (ZYLOPRIM) 100 MG tablet Take 1 tablet (100 mg total) by mouth daily. 11/18/18  Yes Lule, Joana, PA  aspirin EC 81 MG tablet Take 81 mg daily by mouth.   Yes [provider]  bumetanide (BUMEX) 2 MG tablet Take 2 mg by mouth 2 (two) times daily.   Yes [provider]  carvedilol (COREG) 25 MG tablet Take 1 tablet (25 mg total) by mouth 2 (two) times daily with a meal. 12/25/19 03/24/20 Yes Darlin Priestly, MD  Cholecalciferol (VITAMIN D3) 1000 units CAPS Take 4,000 Units by mouth daily.    Yes [provider]  colchicine 0.6 MG tablet Take 0.5 tablets (0.3 mg total) by mouth every other day. 12/25/19  Yes Darlin Priestly, MD  lisinopril (ZESTRIL) 40 MG tablet Take 40 mg by mouth daily.   Yes [provider]  metFORMIN (GLUCOPHAGE) 500 MG tablet Take 1,000 mg by mouth 2 (two) times daily with a meal.    Yes [provider]  Multiple Vitamin (MULTIVITAMIN WITH MINERALS) TABS tablet Take 1 tablet by mouth daily.   Yes [provider]  furosemide (LASIX) 80 MG tablet Take 1 tablet (80 mg total) by mouth 2 (two) times daily. Your new water pills. Patient not taking: Reported on 03/18/2020 12/25/19 03/24/20  Darlin Priestly, MD    Inpatient Medications: Scheduled Meds: . allopurinol  100 mg Oral Daily  . aspirin EC  81 mg Oral Daily  . carvedilol  25 mg Oral BID WC  . cholecalciferol  4,000 Units Oral Daily  . colchicine  0.3 mg Oral QODAY  . furosemide  60 mg Intravenous BID  . insulin aspart  0-20 Units Subcutaneous TID WC  . methylPREDNISolone (SOLU-MEDROL) injection  40 mg Intravenous Daily  . multivitamin with minerals  1  tablet Oral Daily  . sodium chloride flush  3 mL Intravenous Q12H   Continuous Infusions: . sodium chloride     PRN Meds: sodium chloride, ipratropium-albuterol, ondansetron (ZOFRAN) IV, sodium chloride flush, traMADol  Allergies:   Allergies  Allergen Reactions  . Tylenol [Acetaminophen] Anaphylaxis  . Dog Epithelium Allergy Skin Test Hives  . Apple Hives  . Other Hives    RAISINS    Social History:   Social History   Socioeconomic History  . Marital status: Married    Spouse name: Not on file  . Number of children: Not on file  . Years of education: Not on file  . Highest education level: Not on file  Occupational History  . Not on file  Tobacco Use  . Smoking status: Former Smoker    Packs/day: 1.00    Years: 20.00    Pack years: 20.00  Quit date: 10/20/2010    Years since quitting: 9.4  . Smokeless tobacco: Never Used  Substance and Sexual Activity  . Alcohol use: Not Currently  . Drug use: No  . Sexual activity: Not on file  Other Topics Concern  . Not on file  Social History Narrative   Lives in [place] with his wife.  Exercises @ the gym 4-5x/wk - treadmill @ 2-2.5 mph x 1 hr.   Social Determinants of Health   Financial Resource Strain:   . Difficulty of Paying Living Expenses:   Food Insecurity:   . Worried About Programme researcher, broadcasting/film/video in the Last Year:   . Barista in the Last Year:   Transportation Needs:   . Freight forwarder (Medical):   Marland Kitchen Lack of Transportation (Non-Medical):   Physical Activity:   . Days of Exercise per Week:   . Minutes of Exercise per Session:   Stress:   . Feeling of Stress :   Social Connections:   . Frequency of Communication with Friends and Family:   . Frequency of Social Gatherings with Friends and Family:   . Attends Religious Services:   . Active Member of Clubs or Organizations:   . Attends Banker Meetings:   Marland Kitchen Marital Status:   Intimate Partner Violence:   . Fear of Current or  Ex-Partner:   . Emotionally Abused:   Marland Kitchen Physically Abused:   . Sexually Abused:      Family History:   Family History  Problem Relation Age of Onset  . Hypertension Mother   . Hypertension Father   . CVA Father 45    ROS:  Review of Systems  Constitutional: Positive for malaise/fatigue. Negative for chills, diaphoresis, fever and weight loss.  HENT: Negative for congestion.   Eyes: Negative for discharge and redness.  Respiratory: Positive for shortness of breath. Negative for cough, sputum production and wheezing.   Cardiovascular: Positive for orthopnea and leg swelling. Negative for chest pain, palpitations, claudication and PND.  Gastrointestinal: Negative for abdominal pain, heartburn, nausea and vomiting.  Musculoskeletal: Negative for falls and myalgias.  Skin: Negative for rash.  Neurological: Positive for weakness. Negative for dizziness, tingling, tremors, sensory change, speech change, focal weakness and loss of consciousness.  Endo/Heme/Allergies: Does not bruise/bleed easily.  Psychiatric/Behavioral: Negative for substance abuse. The patient is not nervous/anxious.   All other systems reviewed and are negative.     Physical Exam/Data:   Vitals:   03/19/20 2027 03/19/20 2039 03/20/20 0447 03/20/20 0847  BP:  110/73 (!) 154/90 (!) 136/96  Pulse:  (!) 110 99 87  Resp:  20 20 17   Temp:  98.1 F (36.7 C)  97.6 F (36.4 C)  TempSrc:  Oral  Oral  SpO2: 98% 97% 90% 94%  Weight:   (!) 146.3 kg   Height:        Intake/Output Summary (Last 24 hours) at 03/20/2020 1018 Last data filed at 03/19/2020 2042 Gross per 24 hour  Intake 480 ml  Output 1125 ml  Net -645 ml   Filed Weights   03/18/20 1303 03/19/20 0508 03/20/20 0447  Weight: (!) 145.5 kg (!) 147.2 kg (!) 146.3 kg   Body mass index is 52.06 kg/m.   Physical Exam: General: Well developed, well nourished, in no acute distress. Head: Normocephalic, atraumatic, sclera non-icteric, no xanthomas, nares  without discharge.  Neck: Negative for carotid bruits. JVD difficult to assess secondary to body habitus. Lungs: Diminished breath sounds  bilaterally. Breathing is unlabored. Heart: RRR with S1 S2. No murmurs, rubs, or gallops appreciated. Abdomen: Morbidly obese, soft, non-tender, non-distended with normoactive bowel sounds. No hepatomegaly. No rebound/guarding. No obvious abdominal masses. Msk:  Strength and tone appear normal for age. Extremities: No clubbing or cyanosis.  1-2+ bilateral lower extremity pitting edema. Distal pedal pulses are 2+ and equal bilaterally. Neuro: Alert and oriented X 3. No facial asymmetry. No focal deficit. Moves all extremities spontaneously. Psych:  Responds to questions appropriately with a normal affect.   EKG:  The EKG was personally reviewed and demonstrates: Sinus tachycardia, 118 bpm, poor R wave progression along the precordial leads, underlying artifact, no acute ST-T changes Telemetry:  Telemetry was personally reviewed and demonstrates: Sinus tachycardia, low 100s bpm  Weights: Filed Weights   03/18/20 1303 03/19/20 0508 03/20/20 0447  Weight: (!) 145.5 kg (!) 147.2 kg (!) 146.3 kg    Relevant CV Studies:  2D echo 12/22/2019: 1. Left ventricular ejection fraction, by estimation, is 60 to 65%. The  left ventricle has normal function. The left ventricle has no regional  wall motion abnormalities. There is moderate left ventricular hypertrophy.  Left ventricular diastolic  parameters are indeterminate.  2. Right ventricular systolic function is normal. The right ventricular  size is normal.  3. Left atrial size was mildly dilated.  Laboratory Data:  Chemistry Recent Labs  Lab 03/18/20 0843 03/19/20 0522 03/20/20 0723  NA 141 141 139  K 3.5 4.1 4.2  CL 93* 92* 94*  CO2 36* 39* 32  GLUCOSE 186* 128* 171*  BUN 19 25* 38*  CREATININE 1.38* 1.79* 1.36*  CALCIUM 9.6 10.0 10.4*  GFRNONAA >60 45* >60  GFRAA >60 52* >60  ANIONGAP 12 10  13     No results for input(s): PROT, ALBUMIN, AST, ALT, ALKPHOS, BILITOT in the last 168 hours. Hematology Recent Labs  Lab 03/18/20 0843  WBC 9.9  RBC 5.81  HGB 14.4  HCT 46.6  MCV 80.2  MCH 24.8*  MCHC 30.9  RDW 18.1*  PLT 292   Cardiac EnzymesNo results for input(s): TROPONINI in the last 168 hours. No results for input(s): TROPIPOC in the last 168 hours.  BNP Recent Labs  Lab 03/18/20 0843  BNP 68.1    DDimer No results for input(s): DDIMER in the last 168 hours.  Radiology/Studies:  DG Chest Portable 1 View  Result Date: 03/18/2020 IMPRESSION: Cardiomegaly with apparent degree of pulmonary arterial hypertension and vascular congestion. No frank edema or airspace consolidation. Appearance similar to most recent prior studies. Electronically Signed   By: Lowella Grip III M.D.   On: 03/18/2020 08:36    Assessment and Plan:   1.  Acute on chronic HFpEF/severe pulmonary hypertension: -Prior CTA of the chest from 12/2019 negative for PE -With the patient's history of prior DVT consider VQ scan to evaluate for chronic PE -Likely multifactorial etiology including underlying COPD, HFpEF, morbid obesity with OSA and likely OHS along with noncompliance with medical therapy -RHC -Continue IV Lasix 60 mg twice daily with further recommendations pending hemodynamics noted on RHC -Discuss empiric sildenafil with pulmonology -Risks and benefits of cardiac catheterization have been discussed with the patient including risks of bleeding, bruising, infection, stroke, heart attack, injury to a limb, and death. The patient understands these risks and is willing to proceed with the procedure. All questions have been answered and concerns listened to -Daily weights -Strict I's and O's  2.  Elevated high-sensitivity troponin: -Minimal elevated and not consistent with  ACS -No symptoms of chest pain -No plans for inpatient ischemic evaluation at this time  3.  Acute on CKD stage  II: -Improving -Monitor with diuresis  4.  HTN: -Improved -Continue current therapy  5.  Morbid obesity with OSA and likely OHS: -Weight loss advised -Compliance with CPAP recommended     For questions or updates, please contact [facility] Please consult [email or web address] for contact info under Cardiology/STEMI.   Signed, Eula Listen, PA-C [facility] Pager: [phone] 03/20/2020, 10:18 AM

## 2020-03-20 NOTE — Progress Notes (Signed)
Pt. Received from cath lab, sleepy, requesting BI-PAP. Resp. Therapy called. "Bambi," RT ub abd set up BI-PAP machine

## 2020-03-20 NOTE — Evaluation (Signed)
Physical Therapy Evaluation Patient Details Name: Charles Rangel MRN: [id number] DOB: 16-Dec-1975 Today's Date: 03/20/2020   History of Present Illness  Pt admitted for acute on chronic diastolic CHF and is currently pending cardiac cath this date. HIstory includes HF, pulm HTN, COPD, DM, morbid obesity, and gout.   Clinical Impression  Pt is a pleasant 44 year old male who was admitted for acute on chronic diastolic CHF. No home O2. Pt performs transfers and ambulation with supervision. During hallway ambulation, reaching out for hand railing. Pt demonstrates deficits with endurance/mobility. Fatigues with short distances. O2 sats monitored throughout. Would benefit from skilled PT to address above deficits and promote optimal return to PLOF. Recommend transition to [facility] upon discharge from acute hospitalization.  SaO2 on room air at rest = 90% SaO2 on room air while ambulating = 85% SaO2 on 2 liters of O2 while ambulating = 91-92%     Follow Up Recommendations Home health PT    Equipment Recommendations  None recommended by PT    Recommendations for Other Services       Precautions / Restrictions Precautions Precautions: None Restrictions Weight Bearing Restrictions: No      Mobility  Bed Mobility               General bed mobility comments: not performed as received seated in recliner at beginning/end of session  Transfers Overall transfer level: Needs assistance Equipment used: None Transfers: Sit to/from Stand Sit to Stand: Supervision         General transfer comment: safe technique  Ambulation/Gait Ambulation/Gait assistance: Supervision Gait Distance (Feet): 100 Feet Assistive device: None Gait Pattern/deviations: Step-to pattern     General Gait Details: slow gait speed with ambulation slightly unsteady. Reaching out for wall railing. May improve gait quality with BRW in future sessions.   Stairs            Wheelchair Mobility     Modified Rankin (Stroke Patients Only)       Balance Overall balance assessment: Needs assistance Sitting-balance support: Feet supported Sitting balance-Leahy Scale: Good     Standing balance support: Single extremity supported Standing balance-Leahy Scale: Fair                               Pertinent Vitals/Pain Pain Assessment: No/denies pain    Home Living Family/patient expects to be discharged to:: Private residence Living Arrangements: Spouse/significant other(and spouse's sister) Available Help at Discharge: Family Type of Home: House Home Access: Level entry     Home Layout: One level Home Equipment: Environmental consultant - 2 wheels      Prior Function Level of Independence: Independent         Comments: just returned to office from working at home. Was using RW when necessary for gout flare up. Reports no falls     Hand Dominance        Extremity/Trunk Assessment   Upper Extremity Assessment Upper Extremity Assessment: Overall WFL for tasks assessed    Lower Extremity Assessment Lower Extremity Assessment: Overall WFL for tasks assessed       Communication   Communication: No difficulties  Cognition Arousal/Alertness: Awake/alert Behavior During Therapy: WFL for tasks assessed/performed Overall Cognitive Status: Within Functional Limits for tasks assessed  General Comments      Exercises     Assessment/Plan    PT Assessment Patient needs continued PT services  PT Problem List Decreased strength;Decreased activity tolerance;Decreased balance;Decreased mobility;Cardiopulmonary status limiting activity;Obesity       PT Treatment Interventions Gait training;Therapeutic exercise;Balance training    PT Goals (Current goals can be found in the Care Plan section)  Acute Rehab PT Goals Patient Stated Goal: to go home PT Goal Formulation: With patient Time For Goal Achievement:  04/03/20 Potential to Achieve Goals: Good    Frequency Min 2X/week   Barriers to discharge        Co-evaluation               AM-PAC PT "6 Clicks" Mobility  Outcome Measure Help needed turning from your back to your side while in a flat bed without using bedrails?: None Help needed moving from lying on your back to sitting on the side of a flat bed without using bedrails?: None Help needed moving to and from a bed to a chair (including a wheelchair)?: None Help needed standing up from a chair using your arms (e.g., wheelchair or bedside chair)?: None Help needed to walk in hospital room?: A Little Help needed climbing 3-5 steps with a railing? : A Little 6 Click Score: 22    End of Session Equipment Utilized During Treatment: Oxygen Activity Tolerance: Patient limited by fatigue Patient left: in chair Nurse Communication: Mobility status PT Visit Diagnosis: Unsteadiness on feet (R26.81);Muscle weakness (generalized) (M62.81);Difficulty in walking, not elsewhere classified (R26.2)    Time: 3546-5681 PT Time Calculation (min) (ACUTE ONLY): 18 min   Charges:   PT Evaluation $PT Eval Low Complexity: 1 Low PT Treatments $Gait Training: 8-22 mins        Elizabeth Palau, PT, DPT [phone]   Idriss Quackenbush 03/20/2020, 3:23 PM

## 2020-03-20 NOTE — Plan of Care (Signed)
  Problem: Education: Goal: Knowledge of General Education information will improve Description: Including pain rating scale, medication(s)/side effects and non-pharmacologic comfort measures Outcome: Progressing   Problem: Clinical Measurements: Goal: Ability to maintain clinical measurements within normal limits will improve Outcome: Progressing Goal: Respiratory complications will improve Outcome: Progressing   Problem: Activity: Goal: Risk for activity intolerance will decrease Outcome: Progressing   

## 2020-03-21 ENCOUNTER — Encounter: Payer: Self-pay | Admitting: Cardiology

## 2020-03-21 DIAGNOSIS — I272 Pulmonary hypertension, unspecified: Secondary | ICD-10-CM

## 2020-03-21 DIAGNOSIS — J441 Chronic obstructive pulmonary disease with (acute) exacerbation: Secondary | ICD-10-CM

## 2020-03-21 DIAGNOSIS — J9622 Acute and chronic respiratory failure with hypercapnia: Secondary | ICD-10-CM

## 2020-03-21 DIAGNOSIS — J9621 Acute and chronic respiratory failure with hypoxia: Secondary | ICD-10-CM

## 2020-03-21 LAB — GLUCOSE, CAPILLARY
Glucose-Capillary: 99 mg/dL (ref 70–99)
Glucose-Capillary: 182 mg/dL — ABNORMAL HIGH (ref 70–99)
Glucose-Capillary: 347 mg/dL — ABNORMAL HIGH (ref 70–99)
Glucose-Capillary: 175 mg/dL — ABNORMAL HIGH (ref 70–99)

## 2020-03-21 LAB — BASIC METABOLIC PANEL
Anion gap: 13 (ref 5–15)
BUN: 42 mg/dL — ABNORMAL HIGH (ref 6–20)
CO2: 35 mmol/L — ABNORMAL HIGH (ref 22–32)
Calcium: 10.4 mg/dL — ABNORMAL HIGH (ref 8.9–10.3)
Chloride: 95 mmol/L — ABNORMAL LOW (ref 98–111)
Creatinine, Ser: 1.38 mg/dL — ABNORMAL HIGH (ref 0.61–1.24)
GFR calc Af Amer: >60 mL/min (ref >60)
GFR calc non Af Amer: >60 mL/min (ref >60)
Glucose, Bld: 121 mg/dL — ABNORMAL HIGH (ref 70–99)
Potassium: 3.9 mmol/L (ref 3.5–5.1)
Sodium: 143 mmol/L (ref 135–145)

## 2020-03-21 LAB — HEMOGLOBIN A1C
Hgb A1c MFr Bld: 5.4 % (ref 4.8–5.6)
Mean Plasma Glucose: 108 mg/dL

## 2020-03-21 LAB — CALCITRIOL (1,25 DI-OH VIT D): Vit D, 1,25-Dihydroxy: 61.2 pg/mL (ref 19.9–79.3)

## 2020-03-21 NOTE — TOC Initial Note (Signed)
Transition of Care [facility]) - Initial/Assessment Note    Patient Details  Name: Charles Rangel MRN: [id number] Date of Birth: Mar 13, 1976  [facility]) CM/SW Contact:    Shelbie Ammons, RN Phone Number: 03/21/2020, 11:44 AM  Clinical Narrative:   RNCM assessed patient at bedside. Patient sitting up in recliner and reports to feeling somewhat better today. Discussed with patient that he had been approved for NIV and that the MD would decide whether or not he would need to have it set up here to go home with. Patient reports that he does not feel he needs home health at this time, he feels that he is back to his baseline with moving around. RNCM will remain available for any further needs.             Expected Discharge Plan: Home/Self Care Barriers to Discharge: Continued Medical Work up   Patient Goals and CMS Choice        Expected Discharge Plan and Services Expected Discharge Plan: Home/Self Care   Discharge Planning Services: CM Consult   Living arrangements for the past 2 months: Single Family Home                                      Prior Living Arrangements/Services Living arrangements for the past 2 months: Single Family Home Lives with:: Spouse Patient language and need for interpreter reviewed:: Yes        Need for Family Participation in Patient Care: Yes (Comment) Care giver support system in place?: Yes (comment)   Criminal Activity/Legal Involvement Pertinent to Current Situation/Hospitalization: No - Comment as needed  Activities of Daily Living Home Assistive Devices/Equipment: CPAP, Walker (specify type), CBG Meter, Blood pressure cuff, Scales ADL Screening (condition at time of admission) Patient's cognitive ability adequate to safely complete daily activities?: Yes Is the patient deaf or have difficulty hearing?: No Does the patient have difficulty seeing, even when wearing glasses/contacts?: No Does the patient have difficulty  concentrating, remembering, or making decisions?: No Patient able to express need for assistance with ADLs?: Yes Does the patient have difficulty dressing or bathing?: No Independently performs ADLs?: Yes (appropriate for developmental age) Does the patient have difficulty walking or climbing stairs?: No Weakness of Legs: None Weakness of Arms/Hands: None  Permission Sought/Granted                  Emotional Assessment Appearance:: Appears stated age Attitude/Demeanor/Rapport: Engaged   Orientation: : Oriented to Self, Oriented to Place, Oriented to  Time, Oriented to Situation Alcohol / Substance Use: Not Applicable Psych Involvement: No (comment)  Admission diagnosis:  Pulmonary hypertension ([place]) [I27.20] Acute on chronic diastolic congestive heart failure (HCC) [I50.33] Acute on chronic diastolic CHF (congestive heart failure) ([place]) [I50.33] Patient Active Problem List   Diagnosis Date Noted  . Acute on chronic respiratory failure with hypoxia and hypercapnia (HCC)   . COPD with acute exacerbation ([place])   . Pulmonary hypertension, unspecified ([place])   . CKD stage 2 due to type 2 diabetes mellitus ([place])   . Morbid obesity with BMI of 50.0-59.9, adult ([place]) 12/22/2019  . HTN (hypertension) 11/23/2018  . DM (diabetes mellitus) ([place]) 11/23/2018  . Gout 11/16/2018  . CHF (congestive heart failure) ([place]) 11/14/2018  . Acute on chronic heart failure with preserved ejection fraction (HFpEF) ([place]) 06/28/2018   PCP:  [facility] [place]:   [facility]  PHARMACY - [place], [place] - [address] [place] [address] Phone: [phone] Fax: [phone]  [id number] - [place], [place] - [address] [place] [address] Phone: [phone] Fax: [phone]     Social Determinants of Health (SDOH) Interventions    Readmission Risk Interventions No flowsheet data found.

## 2020-03-21 NOTE — Progress Notes (Signed)
Pulmonary Medicine          Date: 03/21/2020,   MRN# [id number] Charles Rangel Jun 03, 1976     AdmissionWeight: (!) 144.2 kg                 CurrentWeight: (!) 148.1 kg  Referring physician: Dr. Radene Gunning    CHIEF COMPLAINT:   New onset pulmonary hypertension   SUBJECTIVE    Patient is clinically improved.   Patient is now on room air with spO2>95%  Patient is negative 7 Liters.  Patient has habit of polydipsia , have counseled him regarding.       PAST MEDICAL HISTORY   Past Medical History:  Diagnosis Date  . (HFpEF) heart failure with preserved ejection fraction (HCC)    a. 02/2018 Echo: EF 65-70%, mildly dil LA.  . CHF (congestive heart failure) (HCC)   . COPD (chronic obstructive pulmonary disease) (HCC)   . Diabetes mellitus without complication (HCC)   . DVT (deep venous thrombosis) (HCC)   . Gout   . Hypertension   . Morbid obesity (HCC)   . OSA (obstructive sleep apnea)      SURGICAL HISTORY   Past Surgical History:  Procedure Laterality Date  . Left leg surgery for fracture    . RIGHT HEART CATH N/A 03/20/2020   Procedure: RIGHT HEART CATH;  Surgeon: Yvonne Kendall, MD;  Location: [facility] INVASIVE CV LAB;  Service: Cardiovascular;  Laterality: N/A;     FAMILY HISTORY   Family History  Problem Relation Age of Onset  . Hypertension Mother   . Hypertension Father   . CVA Father 37     SOCIAL HISTORY   Social History   Tobacco Use  . Smoking status: Former Smoker    Packs/day: 1.00    Years: 20.00    Pack years: 20.00    Quit date: 10/20/2010    Years since quitting: 9.4  . Smokeless tobacco: Never Used  Substance Use Topics  . Alcohol use: Not Currently  . Drug use: No     MEDICATIONS    Home Medication:    Current Medication:  Current Facility-Administered Medications:  .  0.9 %  sodium chloride infusion, 250 mL, Intravenous, PRN, End, [name], MD .  0.9 %  sodium chloride infusion, 250 mL, Intravenous, PRN,  End, [name], MD .  allopurinol (ZYLOPRIM) tablet 100 mg, 100 mg, Oral, Daily, End, [name], MD, 100 mg at 03/21/20 1003 .  aspirin EC tablet 81 mg, 81 mg, Oral, Daily, End, [name], MD, 81 mg at 03/21/20 1003 .  carvedilol (COREG) tablet 25 mg, 25 mg, Oral, BID WC, End, [name], MD, 25 mg at 03/21/20 1717 .  cholecalciferol (VITAMIN D3) tablet 4,000 Units, 4,000 Units, Oral, Daily, End, [name], MD, 4,000 Units at 03/21/20 1003 .  colchicine tablet 0.3 mg, 0.3 mg, Oral, QODAY, End, [name], MD, 0.3 mg at 03/20/20 1000 .  enoxaparin (LOVENOX) injection 40 mg, 40 mg, Subcutaneous, BID, End, [name], MD, 40 mg at 03/21/20 1003 .  furosemide (LASIX) injection 80 mg, 80 mg, Intravenous, BID, End, [name], MD, 80 mg at 03/21/20 1717 .  insulin aspart (novoLOG) injection 0-20 Units, 0-20 Units, Subcutaneous, TID WC, End, [name], MD, 15 Units at 03/21/20 1717 .  ipratropium-albuterol (DUONEB) 0.5-2.5 (3) MG/3ML nebulizer solution 3 mL, 3 mL, Nebulization, Q6H, End, [name], MD, 3 mL at 03/21/20 1351 .  multivitamin with minerals tablet 1 tablet, 1 tablet, Oral, Daily, End, [name], MD, 1 tablet at 03/21/20 1003 .  ondansetron (ZOFRAN) injection 4 mg, 4 mg, Intravenous, Q6H PRN, End, [name], MD .  sildenafil (REVATIO) tablet 20 mg, 20 mg, Oral, TID, [name], MD, 20 mg at 03/21/20 1717 .  sodium chloride flush (NS) 0.9 % injection 3 mL, 3 mL, Intravenous, Q12H, End, [name], MD, 3 mL at 03/20/20 1001 .  sodium chloride flush (NS) 0.9 % injection 3 mL, 3 mL, Intravenous, PRN, End, [name], MD .  sodium chloride flush (NS) 0.9 % injection 3 mL, 3 mL, Intravenous, Q12H, End, [name], MD, 3 mL at 03/21/20 1029 .  sodium chloride flush (NS) 0.9 % injection 3 mL, 3 mL, Intravenous, PRN, End, [name], MD .  traMADol Janean Sark) tablet 50 mg, 50 mg, Oral, Q6H PRN, End, [name], MD, 50 mg at 03/18/20 2158    ALLERGIES   Tylenol  [acetaminophen], Dog epithelium allergy skin test, Apple, and Other     REVIEW OF SYSTEMS    Review of Systems:  Gen:  Denies  fever, sweats, chills weigh loss  HEENT: Denies blurred vision, double vision, ear pain, eye pain, hearing loss, nose bleeds, sore throat Cardiac:  No dizziness, chest pain or heaviness, chest tightness,edema Resp:   Denies cough or sputum porduction, shortness of breath,wheezing, hemoptysis,  Gi: Denies swallowing difficulty, stomach pain, nausea or vomiting, diarrhea, constipation, bowel incontinence Gu:  Denies bladder incontinence, burning urine Ext:   Denies Joint pain, stiffness or swelling Skin: Denies  skin rash, easy bruising or bleeding or hives Endoc:  Denies polyuria, polydipsia , polyphagia or weight change Psych:   Denies depression, insomnia or hallucinations   Other:  All other systems negative   VS: BP 120/73 (BP Location: Left Arm)   Pulse 96   Temp 97.8 F (36.6 C) (Oral)   Resp 20   Ht 5\' 6"  (1.676 m)   Wt (!) 148.1 kg   SpO2 96%   BMI 52.70 kg/m      PHYSICAL EXAM    GENERAL:NAD, no fevers, chills, no weakness no fatigue HEAD: Normocephalic, atraumatic.  EYES: Pupils equal, round, reactive to light. Extraocular muscles intact. No scleral icterus.  MOUTH: Moist mucosal membrane. Dentition intact. No abscess noted.  EAR, NOSE, THROAT: Clear without exudates. No external lesions.  NECK: Supple. No thyromegaly. No nodules. No JVD.  PULMONARY: Mild crackles at bases bilaterally.  CARDIOVASCULAR: S1 and S2. Regular rate and rhythm. No murmurs, rubs, or gallops. No edema. Pedal pulses 2+ bilaterally.  GASTROINTESTINAL: Soft, nontender, nondistended. No masses. Positive bowel sounds. No hepatosplenomegaly.  MUSCULOSKELETAL: No swelling, clubbing, or edema. Range of motion full in all extremities.  NEUROLOGIC: Cranial nerves II through XII are intact. No gross focal neurological deficits. Sensation intact. Reflexes intact.  SKIN:  No ulceration, lesions, rashes, or cyanosis. Skin warm and dry. Turgor intact.  PSYCHIATRIC: Mood, affect within normal limits. The patient is awake, alert and oriented x 3. Insight, judgment intact.       IMAGING    CARDIAC CATHETERIZATION  Result Date: 03/20/2020 Conclusions: 1. Moderately elevated left heart filling pressure. 2. Severely elevated right heart and pulmonary artery pressures. 3. Normal Fick cardiac output/index. Recommendations: 1. Escalate diuresis. 2. Ongoing management of underlying lung disease (including OSA) per primary and pulmonary teams. 05/20/2020, MD [facility]   DG Chest Portable 1 View  Result Date: 03/18/2020 CLINICAL DATA:  Shortness of breath EXAM: PORTABLE CHEST 1 VIEW COMPARISON:  December 22, 2019 chest radiograph and chest CT FINDINGS: There is cardiomegaly with prominence of the main pulmonary artery  suggesting a degree of underlying pulmonary arterial hypertension. There is interstitial prominence, likely due to a degree of vascular congestion. No edema or airspace opacity evident. No adenopathy. No bone lesions. IMPRESSION: Cardiomegaly with apparent degree of pulmonary arterial hypertension and vascular congestion. No frank edema or airspace consolidation. Appearance similar to most recent prior studies. Electronically Signed   By: Lowella Grip III M.D.   On: 03/18/2020 08:36      ASSESSMENT/PLAN   Acute hypoxemic respiratory failure   -Multifactorial in etiology-including diastolic CHF,Mor[place]d obesity,  severe obstructive sleep apnea, pickwickian physiology,deconditioning,  significant pulmonary hypertension of undetermined severity.  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    Patient/Family are satisfied with care plan and all questions have been answered.  This document was prepared using Dragon voice recognition software and may include unintentional dictation errors.     Ottie Glazier, M.D.  Division of [place]

## 2020-03-21 NOTE — Progress Notes (Signed)
PROGRESS NOTE    Charles Rangel   RAQ:[id number]  DOB: [birth year]  PCP: [facility]    DOA: 03/18/2020 LOS: 3   Brief Narrative   44 year old male with history of chronic HFpEF, pulmonary hypertension, COPD, type 2 diabetes mellitus, remote DVT, hypertension, morbid obesity, obstructive sleep apnea with intermittent CPAP use, gout, and prior tobacco use who presented to the ED on 03/18/20 for progressive shortness of breath over past week.  He had worsened to having shortness of breath at rest, nonproductive cough, 4 lb weight gain.  In the ED, chest xray showed cardiomegaly and vascular congestion, dilated pulmonary artery suggesting pulmonary hypertension, no infiltrates.  ECG showed sinus tachycardia.  Patient admitted to hospitalist service for further evaluation and management of multifactorial acute on chronic respiratory failure with hypoxia.  Pulmonology and cardiology consulted.     Assessment & Plan   Principal Problem:   Acute on chronic heart failure with preserved ejection fraction (HFpEF) (HCC) Active Problems:   COPD with acute exacerbation (HCC)   Pulmonary hypertension, unspecified (HCC)   Acute on chronic respiratory failure with hypoxia and hypercapnia (HCC)   HTN (hypertension)   DM (diabetes mellitus) (HCC)   CKD stage 2 due to type 2 diabetes mellitus (HCC)   Morbid obesity with BMI of 50.0-59.9, adult (HCC)  Acute on chronic HFpEF - present on admission.  Echo showed EF 55-60%.  Cardiology following.  Continue IV Lasix 80 mg BID.  Strict I/O's.  Daily weights.  Monitor renal function and electrolytes.  Acute Kidney Injury - secondary to diuresis most likely.  Improved.  Presented with Cr 1.38 (near recent baseline) >> 1.79 >> 1.36 >>1.38.  Monitor BMP.  COPD with acute exacerbation - present on admission, improved.  Completed 3 days IV steroids.  Not wheezing.  Continue Duonebs q6h.  Pulmonary hypertension, unspecified - secondary to COPD,  OSA, obesity, HFpEF.  Right heart cath on 03/20/20 showed severe pulmonary HTN with PA systolic pressure of 70, mean 55 mmHg.  Pulmonology following.  Started on Revatio on 6/1, continue.  Acute on chronic respiratory failure with hypoxia and hypercapnia - multifactorial, due to severe PAH, COPD, OSA, morbid obesity.  Supplemental O2 as needed.  Home NIV approved, home health agency to deliver at time of d/c.    Hypertension - uncontrolled on admission, improved.    Type 2 Diabetes - well controlled.  A1c is 5.4%.  Sliding scale Novolog and CBG's TID.  CKD stage 2 due to type 2 diabetes mellitus - renal function appears close to recent baseline.  Monitor.  Avoid nephrotoxic agents and hypotension.  Renally dose meds as indicated.   BMP's daily.  Gout - not acutely flared.  Continue allopurinol.  Morbid Obesity - Body mass index is 52.7 kg/m.   This complicates overall care and prognosis.  DVT prophylaxis: Lovenox  Diet:  Diet Orders (From admission, onward)    Start     Ordered   03/20/20 1008  Diet Heart Room service appropriate? Yes; Fluid consistency: Thin  Diet effective now    Question Answer Comment  Room service appropriate? Yes   Fluid consistency: Thin      03/20/20 1007            Code Status: Full Code    Subjective 03/21/20    Patient seen up in chair this morning.  No acute events reported overnight.  He asks why he gained 2 pounds from yesterday to today.  Says his breathing feels  better, but still quite short of breath.   Disposition Plan & Communication   Status is: Inpatient  Remains inpatient appropriate because:Inpatient level of care appropriate due to severity of illness, as patient continues to require IV diuresis, and is pending clearance by cardiology and pulmonology.   Dispo: The patient is from: Home              Anticipated d/c is to: Home              Anticipated d/c date is: 2 days              Patient currently is not medically stable to  d/c.   Family Communication: none at bedside, will attempt to call   Consults, Procedures, Significant Events   Consultants:   Cardiology  Pulmonology  Procedures:   Right heart catheterization 03/20/2020  Antimicrobials:   None  Objective   Vitals:   03/20/20 1940 03/21/20 0318 03/21/20 0723 03/21/20 1216  BP:  (!) 148/96 (!) 149/101 125/83  Pulse:  75 81 84  Resp:  20 18 18   Temp:  97.6 F (36.4 C) 98.1 F (36.7 C) 98 F (36.7 C)  TempSrc:  Axillary Axillary Oral  SpO2: 93% 99% 97% 94%  Weight:  (!) 148.1 kg    Height:        Intake/Output Summary (Last 24 hours) at 03/21/2020 1347 Last data filed at 03/21/2020 1218 Gross per 24 hour  Intake 480 ml  Output 3750 ml  Net -3270 ml   Filed Weights   03/19/20 0508 03/20/20 0447 03/21/20 0318  Weight: (!) 147.2 kg (!) 146.3 kg (!) 148.1 kg    Physical Exam:  General exam: awake, alert, no acute distress, morbidly obese HEENT: moist mucus membranes, hearing grossly normal  Respiratory system: Decreased breath sounds, no wheezes or rhonchi, normal respiratory effort. Cardiovascular system: normal S1/S2, RRR, b/l lower extremity edema.   Central nervous system: A&O x3. no gross focal neurologic deficits, normal speech Extremities: moves all, normal tone Skin: dry, intact, normal temperature, normal color  Labs   Data Reviewed: I have personally reviewed following labs and imaging studies  CBC: Recent Labs  Lab 03/18/20 0843  WBC 9.9  NEUTROABS 7.9*  HGB 14.4  HCT 46.6  MCV 80.2  PLT 292   Basic Metabolic Panel: Recent Labs  Lab 03/18/20 0843 03/19/20 0522 03/20/20 0723 03/21/20 0620  NA 141 141 139 143  K 3.5 4.1 4.2 3.9  CL 93* 92* 94* 95*  CO2 36* 39* 32 35*  GLUCOSE 186* 128* 171* 121*  BUN 19 25* 38* 42*  CREATININE 1.38* 1.79* 1.36* 1.38*  CALCIUM 9.6 10.0 10.4* 10.4*   GFR: Estimated Creatinine Clearance: 93.2 mL/min (A) (by C-G formula based on SCr of 1.38 mg/dL (H)). Liver  Function Tests: No results for input(s): AST, ALT, ALKPHOS, BILITOT, PROT, ALBUMIN in the last 168 hours. No results for input(s): LIPASE, AMYLASE in the last 168 hours. No results for input(s): AMMONIA in the last 168 hours. Coagulation Profile: No results for input(s): INR, PROTIME in the last 168 hours. Cardiac Enzymes: No results for input(s): CKTOTAL, CKMB, CKMBINDEX, TROPONINI in the last 168 hours. BNP (last 3 results) No results for input(s): PROBNP in the last 8760 hours. HbA1C: Recent Labs    03/19/20 0622  HGBA1C 5.4   CBG: Recent Labs  Lab 03/20/20 1529 03/20/20 1638 03/20/20 2111 03/21/20 0721 03/21/20 1216  GLUCAP 268* 210* 224* 99 182*   Lipid Profile:  No results for input(s): CHOL, HDL, LDLCALC, TRIG, CHOLHDL, LDLDIRECT in the last 72 hours. Thyroid Function Tests: Recent Labs    03/20/20 1018  TSH 2.305   Anemia Panel: No results for input(s): VITAMINB12, FOLATE, FERRITIN, TIBC, IRON, RETICCTPCT in the last 72 hours. Sepsis Labs: No results for input(s): PROCALCITON, LATICACIDVEN in the last 168 hours.  Recent Results (from the past 240 hour(s))  SARS Coronavirus 2 by RT PCR (hospital order, performed in [facility] hospital lab) Nasopharyngeal Nasopharyngeal Swab     Status: None   Collection Time: 03/18/20  9:44 AM   Specimen: Nasopharyngeal Swab  Result Value Ref Range Status   SARS Coronavirus 2 NEGATIVE NEGATIVE Final    Comment: (NOTE) SARS-CoV-2 target nucleic acids are NOT DETECTED. The SARS-CoV-2 RNA is generally detectable in upper and lower respiratory specimens during the acute phase of infection. The lowest concentration of SARS-CoV-2 viral copies this assay can detect is 250 copies / mL. A negative result does not preclude SARS-CoV-2 infection and should not be used as the sole basis for treatment or other patient management decisions.  A negative result may occur with improper specimen collection / handling, submission of specimen  other than nasopharyngeal swab, presence of viral mutation(s) within the areas targeted by this assay, and inadequate number of viral copies (<250 copies / mL). A negative result must be combined with clinical observations, patient history, and epidemiological information. Fact Sheet for Patients:   BoilerBrush.com.cy Fact Sheet for Healthcare Providers: https://pope.com/ This test is not yet approved or cleared  by the [place] FDA and has been authorized for detection and/or diagnosis of SARS-CoV-2 by FDA under an Emergency Use Authorization (EUA).  This EUA will remain in effect (meaning this test can be used) for the duration of the COVID-19 declaration under Section 564(b)(1) of the Act, 21 U.S.C. section 360bbb-3(b)(1), unless the authorization is terminated or revoked sooner. Performed at [facility], [address], [place], [place] [address]       Imaging Studies   CARDIAC CATHETERIZATION  Result Date: 03/20/2020 Conclusions: 1. Moderately elevated left heart filling pressure. 2. Severely elevated right heart and pulmonary artery pressures. 3. Normal Fick cardiac output/index. Recommendations: 1. Escalate diuresis. 2. Ongoing management of underlying lung disease (including OSA) per primary and pulmonary teams. Yvonne Kendall, MD [facility]     Medications   Scheduled Meds: . allopurinol  100 mg Oral Daily  . aspirin EC  81 mg Oral Daily  . carvedilol  25 mg Oral BID WC  . cholecalciferol  4,000 Units Oral Daily  . colchicine  0.3 mg Oral QODAY  . enoxaparin (LOVENOX) injection  40 mg Subcutaneous BID  . furosemide  80 mg Intravenous BID  . insulin aspart  0-20 Units Subcutaneous TID WC  . ipratropium-albuterol  3 mL Nebulization Q6H  . methylPREDNISolone (SOLU-MEDROL) injection  40 mg Intravenous Daily  . multivitamin with minerals  1 tablet Oral Daily  . sildenafil  20 mg Oral TID  . sodium  chloride flush  3 mL Intravenous Q12H  . sodium chloride flush  3 mL Intravenous Q12H   Continuous Infusions: . sodium chloride    . sodium chloride         LOS: 3 days    Time spent: 30 minutes    Pennie Banter, DO [facility]  03/21/2020, 1:47 PM    If 7PM-7AM, please contact night-coverage. How to contact the [facility] Attending or Consulting provider 7A - 7P or  covering provider during after hours [place], for this patient?    1. Check the care team in [facility] and look for a) attending/consulting TRH provider listed and b) the [facility] team listed 2. Log into [email or web address] and use [place]'s universal password to access. If you do not have the password, please contact the hospital operator. 3. Locate the [facility] provider you are looking for under [facility] and page to a number that you can be directly reached. 4. If you still have difficulty reaching the provider, please page the [facility] (Director on Call) for the Hospitalists listed on amion for assistance.

## 2020-03-21 NOTE — Hospital Course (Addendum)
44 year old male with history of chronic HFpEF, pulmonary hypertension, COPD, type 2 diabetes mellitus, remote DVT, hypertension, morbid obesity, obstructive sleep apnea with intermittent CPAP use, gout, and prior tobacco use who presented to the ED on 03/18/20 for progressive shortness of breath over past week.  He had worsened to having shortness of breath at rest, nonproductive cough, 4 lb weight gain.  In the ED, chest xray showed cardiomegaly and vascular congestion, dilated pulmonary artery suggesting pulmonary hypertension, no infiltrates.  ECG showed sinus tachycardia.  Patient admitted to hospitalist service for further evaluation and management of multifactorial acute on chronic respiratory failure with hypoxia.  Pulmonology and cardiology consulted.

## 2020-03-21 NOTE — Progress Notes (Addendum)
Progress Note  Patient Name: Charles Rangel Date of Encounter: 03/21/2020  Primary Cardiologist: Lorine Bears, MD   Subjective   Patient doing okay, breathing better.  Net -2.6 L over the past 24 hours.  Creatinine stable.  Right heart cath performed yesterday showing elevated wedge pressures of 30 mmHg.  Pulmonary artery systolic pressure was severely elevated at 70.  Mean of 55 mmHg.  Inpatient Medications    Scheduled Meds: . allopurinol  100 mg Oral Daily  . aspirin EC  81 mg Oral Daily  . carvedilol  25 mg Oral BID WC  . cholecalciferol  4,000 Units Oral Daily  . colchicine  0.3 mg Oral QODAY  . enoxaparin (LOVENOX) injection  40 mg Subcutaneous BID  . furosemide  80 mg Intravenous BID  . insulin aspart  0-20 Units Subcutaneous TID WC  . ipratropium-albuterol  3 mL Nebulization Q6H  . methylPREDNISolone (SOLU-MEDROL) injection  40 mg Intravenous Daily  . multivitamin with minerals  1 tablet Oral Daily  . sildenafil  20 mg Oral TID  . sodium chloride flush  3 mL Intravenous Q12H  . sodium chloride flush  3 mL Intravenous Q12H   Continuous Infusions: . sodium chloride    . sodium chloride     PRN Meds: sodium chloride, sodium chloride, ondansetron (ZOFRAN) IV, sodium chloride flush, sodium chloride flush, traMADol   Vital Signs    Vitals:   03/20/20 1929 03/20/20 1940 03/21/20 0318 03/21/20 0723  BP: 130/69  (!) 148/96 (!) 149/101  Pulse: (!) 103  75 81  Resp: 19  20 18   Temp: 98.7 F (37.1 C)  97.6 F (36.4 C) 98.1 F (36.7 C)  TempSrc: Oral  Axillary Axillary  SpO2: 95% 93% 99% 97%  Weight:   (!) 148.1 kg   Height:        Intake/Output Summary (Last 24 hours) at 03/21/2020 1154 Last data filed at 03/21/2020 1000 Gross per 24 hour  Intake 240 ml  Output 2700 ml  Net -2460 ml   Last 3 Weights 03/21/2020 03/20/2020 03/19/2020  Weight (lbs) 326 lb 8 oz 322 lb 8.5 oz 324 lb 9.6 oz  Weight (kg) 148.099 kg 146.3 kg 147.238 kg      Telemetry    Sinus  rhythm- Personally Reviewed  ECG    No new ECG obtained- Personally Reviewed  Physical Exam   GEN: No acute distress, obese.   Neck: No JVD Cardiac: RRR, no murmurs, rubs, or gallops.  Respiratory:  Decreased breath sounds at bases, poor inspiratory effort GI: Soft, nontender, non-distended  MS: 1+ edema; No deformity. Neuro:  Nonfocal  Psych: Normal affect   Labs    High Sensitivity Troponin:   Recent Labs  Lab 03/18/20 0843 03/18/20 1139  TROPONINIHS 19* 21*      Chemistry Recent Labs  Lab 03/19/20 0522 03/20/20 0723 03/21/20 0620  NA 141 139 143  K 4.1 4.2 3.9  CL 92* 94* 95*  CO2 39* 32 35*  GLUCOSE 128* 171* 121*  BUN 25* 38* 42*  CREATININE 1.79* 1.36* 1.38*  CALCIUM 10.0 10.4* 10.4*  GFRNONAA 45* >60 >60  GFRAA 52* >60 >60  ANIONGAP 10 13 13      Hematology Recent Labs  Lab 03/18/20 0843  WBC 9.9  RBC 5.81  HGB 14.4  HCT 46.6  MCV 80.2  MCH 24.8*  MCHC 30.9  RDW 18.1*  PLT 292    BNP Recent Labs  Lab 03/18/20 0843  BNP 68.1  DDimer No results for input(s): DDIMER in the last 168 hours.   Radiology    CARDIAC CATHETERIZATION  Result Date: 03/20/2020 Conclusions: 1. Moderately elevated left heart filling pressure. 2. Severely elevated right heart and pulmonary artery pressures. 3. Normal Fick cardiac output/index. Recommendations: 1. Escalate diuresis. 2. Ongoing management of underlying lung disease (including OSA) per primary and pulmonary teams. Nelva Bush, MD [facility]    Cardiac Studies   Right heart cath 03/2020 Right Heart Pressures RA (a): 23 mmHg RV (S/EDP): 72/25 mmHg PA (S/D, mean): 70/47 (55)mmHg PCWP (mean): 30 mmHg  Ao sat: 90% PA sat: 72%  Fick CO: 8.7 L/min Fick CI: 3.6 L/min/m^2  PVR: 2.9 Wood units    Conclusions: 1. Moderately elevated left heart filling pressure. 2. Severely elevated right heart and pulmonary artery pressures. 3. Normal Fick cardiac  output/index.  Recommendations: 1. Escalate diuresis. 2. Ongoing management of underlying lung disease (including OSA) per primary and pulmonary teams  Patient Profile     44 y.o. male with history of heart failure preserved ejection fraction, severe pulmonary hypertension presenting with shortness of breath.  Assessment & Plan    1. HFpEF LVEF 60 to 65% -Obesity contributing. -Elevated wedge pressures -Continue IV Lasix 80 mg twice daily -Creatinine stable  2.  Severe pulmonary hypertension -Post capillary, PVR 2.9 Woods units -Started on sildenafil -IV Lasix -Pulmonary medicine following -Recommend PT OT.   Total encounter time 35 minutes  Greater than 50% was spent in counseling and coordination of care with the patient  Signed, Kate Sable, MD  03/21/2020, 11:54 AM

## 2020-03-22 LAB — CBC
HCT: 45.8 % (ref 39.0–52.0)
Hemoglobin: 15.2 g/dL (ref 13.0–17.0)
MCH: 25.5 pg — ABNORMAL LOW (ref 26.0–34.0)
MCHC: 33.2 g/dL (ref 30.0–36.0)
MCV: 76.8 fL — ABNORMAL LOW (ref 80.0–100.0)
Platelets: 299 10*3/uL (ref 150–400)
RBC: 5.96 MIL/uL — ABNORMAL HIGH (ref 4.22–5.81)
RDW: 18 % — ABNORMAL HIGH (ref 11.5–15.5)
WBC: 8.5 10*3/uL (ref 4.0–10.5)
nRBC: 0 % (ref 0.0–0.2)

## 2020-03-22 LAB — BASIC METABOLIC PANEL
Anion gap: 13 (ref 5–15)
BUN: 39 mg/dL — ABNORMAL HIGH (ref 6–20)
CO2: 35 mmol/L — ABNORMAL HIGH (ref 22–32)
Calcium: 10.1 mg/dL (ref 8.9–10.3)
Chloride: 94 mmol/L — ABNORMAL LOW (ref 98–111)
Creatinine, Ser: 1.27 mg/dL — ABNORMAL HIGH (ref 0.61–1.24)
GFR calc Af Amer: >60 mL/min (ref >60)
GFR calc non Af Amer: >60 mL/min (ref >60)
Glucose, Bld: 107 mg/dL — ABNORMAL HIGH (ref 70–99)
Potassium: 4.1 mmol/L (ref 3.5–5.1)
Sodium: 142 mmol/L (ref 135–145)

## 2020-03-22 LAB — MAGNESIUM: Magnesium: 2.3 mg/dL (ref 1.7–2.4)

## 2020-03-22 LAB — PARATHYROID HORMONE, INTACT (NO CA): PTH: 18 pg/mL (ref 15–65)

## 2020-03-22 LAB — GLUCOSE, CAPILLARY
Glucose-Capillary: 124 mg/dL — ABNORMAL HIGH (ref 70–99)
Glucose-Capillary: 147 mg/dL — ABNORMAL HIGH (ref 70–99)
Glucose-Capillary: 154 mg/dL — ABNORMAL HIGH (ref 70–99)
Glucose-Capillary: 186 mg/dL — ABNORMAL HIGH (ref 70–99)

## 2020-03-22 MED ORDER — IPRATROPIUM-ALBUTEROL 0.5-2.5 (3) MG/3ML IN SOLN: 3.0000 mL | Freq: Four times a day (QID) | RESPIRATORY_TRACT | Status: DC | PRN

## 2020-03-22 MED ORDER — LORATADINE 10 MG PO TABS
10.0000 mg | ORAL_TABLET | Freq: Every day | ORAL | Status: DC
  Administered 2020-03-22 – 2020-03-23 (×2): 10 mg via ORAL
  Filled 2020-03-22 (×2): qty 1

## 2020-03-22 NOTE — Progress Notes (Signed)
Pulmonary Medicine          Date: 03/22/2020,   MRN# [id number] Charles Rangel [birth year]     AdmissionWeight: (!) 144.2 kg                 CurrentWeight: (!) 146 kg  Referring physician: Dr. Algie Coffer    CHIEF COMPLAINT:   New onset pulmonary hypertension   SUBJECTIVE    Patient is clinically improved.   Patient is now on room air with spO2>95%  Patient is negative 10 Liters.  Patient has habit of polydipsia , have counseled him regarding this, recommend <40 oz fluid intake on outpatient.       PAST MEDICAL HISTORY   Past Medical History:  Diagnosis Date  . (HFpEF) heart failure with preserved ejection fraction ([place])    a. 02/2018 Echo: EF 65-70%, mildly dil LA.  . CHF (congestive heart failure) ([place])   . COPD (chronic obstructive pulmonary disease) ([place])   . Diabetes mellitus without complication ([place])   . DVT (deep venous thrombosis) ([place])   . Gout   . Hypertension   . Morbid obesity ([place])   . OSA (obstructive sleep apnea)      SURGICAL HISTORY   Past Surgical History:  Procedure Laterality Date  . Left leg surgery for fracture    . RIGHT HEART CATH N/A 03/20/2020   Procedure: RIGHT HEART CATH;  Surgeon: Nelva Bush, MD;  Location: [facility];  Service: Cardiovascular;  Laterality: N/A;     FAMILY HISTORY   Family History  Problem Relation Age of Onset  . Hypertension Mother   . Hypertension Father   . CVA Father 36     SOCIAL HISTORY   Social History   Tobacco Use  . Smoking status: Former Smoker    Packs/day: 1.00    Years: 20.00    Pack years: 20.00    Quit date: 10/20/2010    Years since quitting: 9.4  . Smokeless tobacco: Never Used  Substance Use Topics  . Alcohol use: Not Currently  . Drug use: No     MEDICATIONS    Home Medication:    Current Medication:  Current Facility-Administered Medications:  .  0.9 %  sodium chloride infusion, 250 mL, Intravenous, PRN, End, [name], MD .  0.9 %   sodium chloride infusion, 250 mL, Intravenous, PRN, End, [name], MD .  allopurinol (ZYLOPRIM) tablet 100 mg, 100 mg, Oral, Daily, End, [name], MD, 100 mg at 03/22/20 1145 .  aspirin EC tablet 81 mg, 81 mg, Oral, Daily, End, [name], MD, 81 mg at 03/22/20 0757 .  carvedilol (COREG) tablet 25 mg, 25 mg, Oral, BID WC, End, [name], MD, 25 mg at 03/22/20 1734 .  cholecalciferol (VITAMIN D3) tablet 4,000 Units, 4,000 Units, Oral, Daily, End, [name], MD, 4,000 Units at 03/22/20 0757 .  colchicine tablet 0.3 mg, 0.3 mg, Oral, QODAY, End, [name], MD, 0.3 mg at 03/22/20 0758 .  enoxaparin (LOVENOX) injection 40 mg, 40 mg, Subcutaneous, BID, End, [name], MD, 40 mg at 03/22/20 0756 .  furosemide (LASIX) injection 80 mg, 80 mg, Intravenous, BID, End, [name], MD, 80 mg at 03/22/20 1734 .  insulin aspart (novoLOG) injection 0-20 Units, 0-20 Units, Subcutaneous, TID WC, End, [name], MD, 4 Units at 03/22/20 1735 .  ipratropium-albuterol (DUONEB) 0.5-2.5 (3) MG/3ML nebulizer solution 3 mL, 3 mL, Nebulization, Q6H PRN, [name], Florean Hoobler, MD .  loratadine (CLARITIN) tablet 10 mg, 10 mg, Oral, Daily, Nicole Kindred A, DO, 10  mg at 03/22/20 1735 .  multivitamin with minerals tablet 1 tablet, 1 tablet, Oral, Daily, End, [name], MD, 1 tablet at 03/22/20 0758 .  ondansetron (ZOFRAN) injection 4 mg, 4 mg, Intravenous, Q6H PRN, End, [name], MD .  sildenafil (REVATIO) tablet 20 mg, 20 mg, Oral, TID, [name], MD, 20 mg at 03/22/20 1735 .  sodium chloride flush (NS) 0.9 % injection 3 mL, 3 mL, Intravenous, Q12H, End, [name], MD, 3 mL at 03/20/20 1001 .  sodium chloride flush (NS) 0.9 % injection 3 mL, 3 mL, Intravenous, PRN, End, [name], MD .  sodium chloride flush (NS) 0.9 % injection 3 mL, 3 mL, Intravenous, Q12H, End, [name], MD, 3 mL at 03/22/20 0841 .  sodium chloride flush (NS) 0.9 % injection 3 mL, 3 mL, Intravenous, PRN, End, [name], MD .   traMADol Janean Sark) tablet 50 mg, 50 mg, Oral, Q6H PRN, End, [name], MD, 50 mg at 03/18/20 2158    ALLERGIES   Tylenol [acetaminophen], Dog epithelium allergy skin test, Apple, and Other     REVIEW OF SYSTEMS    Review of Systems:  Gen:  Denies  fever, sweats, chills weigh loss  HEENT: Denies blurred vision, double vision, ear pain, eye pain, hearing loss, nose bleeds, sore throat Cardiac:  No dizziness, chest pain or heaviness, chest tightness,edema Resp:   Denies cough or sputum porduction, shortness of breath,wheezing, hemoptysis,  Gi: Denies swallowing difficulty, stomach pain, nausea or vomiting, diarrhea, constipation, bowel incontinence Gu:  Denies bladder incontinence, burning urine Ext:   Denies Joint pain, stiffness or swelling Skin: Denies  skin rash, easy bruising or bleeding or hives Endoc:  Denies polyuria, polydipsia , polyphagia or weight change Psych:   Denies depression, insomnia or hallucinations   Other:  All other systems negative   VS: BP 115/65 (BP Location: Right Arm)   Pulse 87   Temp 98.4 F (36.9 C) (Oral)   Resp 18   Ht 5\' 6"  (1.676 m)   Wt (!) 146 kg   SpO2 98%   BMI 51.96 kg/m      PHYSICAL EXAM    GENERAL:NAD, no fevers, chills, no weakness no fatigue HEAD: Normocephalic, atraumatic.  EYES: Pupils equal, round, reactive to light. Extraocular muscles intact. No scleral icterus.  MOUTH: Moist mucosal membrane. Dentition intact. No abscess noted.  EAR, NOSE, THROAT: Clear without exudates. No external lesions.  NECK: Supple. No thyromegaly. No nodules. No JVD.  PULMONARY: Mild crackles at bases bilaterally.  CARDIOVASCULAR: S1 and S2. Regular rate and rhythm. No murmurs, rubs, or gallops. No edema. Pedal pulses 2+ bilaterally.  GASTROINTESTINAL: Soft, nontender, nondistended. No masses. Positive bowel sounds. No hepatosplenomegaly.  MUSCULOSKELETAL: No swelling, clubbing, or edema. Range of motion full in all extremities.   NEUROLOGIC: Cranial nerves II through XII are intact. No gross focal neurological deficits. Sensation intact. Reflexes intact.  SKIN: No ulceration, lesions, rashes, or cyanosis. Skin warm and dry. Turgor intact.  PSYCHIATRIC: Mood, affect within normal limits. The patient is awake, alert and oriented x 3. Insight, judgment intact.       IMAGING    CARDIAC CATHETERIZATION  Result Date: 03/20/2020 Conclusions: 1. Moderately elevated left heart filling pressure. 2. Severely elevated right heart and pulmonary artery pressures. 3. Normal Fick cardiac output/index. Recommendations: 1. Escalate diuresis. 2. Ongoing management of underlying lung disease (including OSA) per primary and pulmonary teams. 05/20/2020, MD [facility]   DG Chest Portable 1 View  Result Date: 03/18/2020 CLINICAL DATA:  Shortness of breath  EXAM: PORTABLE CHEST 1 VIEW COMPARISON:  December 22, 2019 chest radiograph and chest CT FINDINGS: There is cardiomegaly with prominence of the main pulmonary artery suggesting a degree of underlying pulmonary arterial hypertension. There is interstitial prominence, likely due to a degree of vascular congestion. No edema or airspace opacity evident. No adenopathy. No bone lesions. IMPRESSION: Cardiomegaly with apparent degree of pulmonary arterial hypertension and vascular congestion. No frank edema or airspace consolidation. Appearance similar to most recent prior studies. Electronically Signed   By: Bretta Bang III M.D.   On: 03/18/2020 08:36      ASSESSMENT/PLAN   Acute hypoxemic respiratory failure   -Multifactorial in etiology-including diastolic CHF,[place] obesity,  severe obstructive sleep apnea, pickwickian physiology,deconditioning,  significant pulmonary hypertension of undetermined severity.  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    Patient/Family are satisfied with care plan and all questions have been answered.  This document was prepared using Dragon voice recognition software and may include unintentional dictation errors.     [name], M.D.  Division of Pulmonary & Critical Care Medicine  [facility]

## 2020-03-22 NOTE — Progress Notes (Signed)
Bedside Spirometry Completed. Hardcopy of report added to patient's physical chart.  PREDICTED FVC: 3.71 ACTUAL FVC: 2.87 % PREDICTED FVC: 34.8%  PREDICTED FEV1: 3.01 ACTUAL FEV1: 2.27 % PREDICTED FEV1: 35.6%  PREDICTED FEV1/FVC: 81.0 ACTUAL FEV1/FVC: 70.5 % PREDICTED FEV1/FVC: 102.6

## 2020-03-22 NOTE — Progress Notes (Signed)
   03/22/20 0700  ReDS Vest / Clip  Anatomical Comments NA due to BMI > 36

## 2020-03-22 NOTE — Progress Notes (Signed)
Physical Therapy Treatment Patient Details Name: Charles Rangel MRN: [id number] DOB: 1976/07/18 Today's Date: 03/22/2020    History of Present Illness Pt admitted for acute on chronic diastolic CHF and is currently pending cardiac cath this date. HIstory includes HF, pulm HTN, COPD, DM, morbid obesity, and gout.     PT Comments    In chair, feeling good.  BP noted to be hight this am.  Taken prior to session 130/75.  Stood and was able to walk in room with no AD but reaches for bed and counter for balance.  He completes lap around small pod on room air with no c/o SOB.  He does use RW for support with overall improved gait.  He stated he has a RW at home he uses on occasion when he has gout.  Encouraged him to use it as needed at home especially when outside or in the community for general safety/balance and energy conservation but he seemed unlikely to use it in the home.    Urinal was emptied during session.  Full to top.  approx 1200 CC.  Secure message to tech to make her aware.    Follow Up Recommendations  Home health PT     Equipment Recommendations  None recommended by PT    Recommendations for Other Services       Precautions / Restrictions Precautions Precautions: None Restrictions Weight Bearing Restrictions: No    Mobility  Bed Mobility               General bed mobility comments: in recliner before and after  Transfers Overall transfer level: Modified independent Equipment used: None Transfers: Sit to/from Stand Sit to Stand: Supervision;Modified independent (Device/Increase time)            Ambulation/Gait Ambulation/Gait assistance: Supervision Gait Distance (Feet): 100 Feet Assistive device: Rolling walker (2 wheeled);None Gait Pattern/deviations: Step-through pattern;Decreased step length - right;Decreased step length - left;Wide base of support Gait velocity: decreased   General Gait Details: steady with RW,  somewhat hesitant without AD  but reaches for bed etc in room for support/balance   Stairs             Wheelchair Mobility    Modified Rankin (Stroke Patients Only)       Balance Overall balance assessment: Needs assistance   Sitting balance-Leahy Scale: Good     Standing balance support: Single extremity supported Standing balance-Leahy Scale: Fair                              Cognition Arousal/Alertness: Awake/alert Behavior During Therapy: WFL for tasks assessed/performed Overall Cognitive Status: Within Functional Limits for tasks assessed                                        Exercises      General Comments        Pertinent Vitals/Pain Pain Assessment: No/denies pain    Home Living                      Prior Function            PT Goals (current goals can now be found in the care plan section) Progress towards PT goals: Progressing toward goals    Frequency    Min 2X/week      PT Plan Current  plan remains appropriate    Co-evaluation              AM-PAC PT "6 Clicks" Mobility   Outcome Measure  Help needed turning from your back to your side while in a flat bed without using bedrails?: None Help needed moving from lying on your back to sitting on the side of a flat bed without using bedrails?: None Help needed moving to and from a bed to a chair (including a wheelchair)?: None Help needed standing up from a chair using your arms (e.g., wheelchair or bedside chair)?: None Help needed to walk in hospital room?: A Little Help needed climbing 3-5 steps with a railing? : A Little 6 Click Score: 22    End of Session Equipment Utilized During Treatment: Gait belt Activity Tolerance: Patient tolerated treatment well;Patient limited by fatigue Patient left: in chair;with call bell/phone within reach Nurse Communication: Other (comment)       Time: 5102-5852 PT Time Calculation (min) (ACUTE ONLY): 11 min  Charges:   $Gait Training: 8-22 mins                    Chesley Noon, PTA 03/22/20, 10:04 AM

## 2020-03-22 NOTE — Progress Notes (Signed)
PROGRESS NOTE    Charles Rangel   EPP:[id number]  DOB: 24-Feb-1976  PCP: [facility]    DOA: 03/18/2020 LOS: 4   Brief Narrative   44 year old male with history of chronic HFpEF, pulmonary hypertension, COPD, type 2 diabetes mellitus, remote DVT, hypertension, morbid obesity, obstructive sleep apnea with intermittent CPAP use, gout, and prior tobacco use who presented to the ED on 03/18/20 for progressive shortness of breath over past week.  He had worsened to having shortness of breath at rest, nonproductive cough, 4 lb weight gain.  In the ED, chest xray showed cardiomegaly and vascular congestion, dilated pulmonary artery suggesting pulmonary hypertension, no infiltrates.  ECG showed sinus tachycardia.  Patient admitted to hospitalist service for further evaluation and management of multifactorial acute on chronic respiratory failure with hypoxia.  Pulmonology and cardiology consulted.     Assessment & Plan   Principal Problem:   Acute on chronic heart failure with preserved ejection fraction (HFpEF) (HCC) Active Problems:   COPD with acute exacerbation (HCC)   Pulmonary hypertension, unspecified (HCC)   Acute on chronic respiratory failure with hypoxia and hypercapnia (HCC)   HTN (hypertension)   DM (diabetes mellitus) (HCC)   CKD stage 2 due to type 2 diabetes mellitus (HCC)   Morbid obesity with BMI of 50.0-59.9, adult (HCC)  Acute on chronic HFpEF - present on admission.  Echo showed EF 55-60%.  Cardiology following.  Continue IV Lasix 80 mg BID until objective signs of "dry".  Strict I/O's.  Daily weights.  Monitor renal function and electrolytes.  Acute Kidney Injury - secondary to diuresis most likely.  Improved.  Presented with Cr 1.38 (near recent baseline) >> 1.79 >> 1.36 >>1.38>>1.27.  Monitor BMP daily.  Allergic rhinitis - new. Denies history of seasonal allergies. Reported sneezing and itchy nose today (6/3).  Will trial loratadine & monitor.  COPD  with acute exacerbation - present on admission, improved.  Completed 3 days IV steroids.  Not wheezing.  Continue Duonebs q6h PRN.  Pulmonary hypertension, unspecified - secondary to COPD, OSA, obesity, HFpEF.  Right heart cath on 03/20/20 showed severe pulmonary HTN with PA systolic pressure of 70, mean 55 mmHg.  Pulmonology following.  Started on Revatio on 6/1, continue.  Acute on chronic respiratory failure with hypoxia and hypercapnia - multifactorial, due to severe PAH, COPD, OSA, morbid obesity.  Supplemental O2 as needed.  Home NIV approved, home health agency to deliver at time of d/c.    Hypertension - uncontrolled on admission, improved.    Type 2 Diabetes - well controlled.  A1c is 5.4%.  Sliding scale Novolog and CBG's TID.  CKD stage 2 due to type 2 diabetes mellitus - renal function appears close to recent baseline.  Monitor.  Avoid nephrotoxic agents and hypotension.  Renally dose meds as indicated.   BMP's daily.  Gout - not acutely flared.  Continue allopurinol.  Morbid Obesity - Body mass index is 51.96 kg/m.   This complicates overall care and prognosis.  DVT prophylaxis: Lovenox  Diet:  Diet Orders (From admission, onward)    Start     Ordered   03/21/20 1938  Diet renal with fluid restriction Fluid restriction: 1200 mL Fluid; Room service appropriate? Yes; Fluid consistency: Thin  Diet effective now    Question Answer Comment  Fluid restriction: 1200 mL Fluid   Room service appropriate? Yes   Fluid consistency: Thin      03/21/20 1937  Code Status: Full Code    Subjective 03/22/20    Patient seen up in chair this morning.  No acute events reported overnight.  He says he has been sneezing a lot this morning, and nose is really itchy.  Just started today.  Denies history of seasonal allergies.  Otherwise reports feeling improved.  Denies CP or SOB or other acute complaints.    Disposition Plan & Communication   Status is: Inpatient  Remains  inpatient appropriate because:Inpatient level of care appropriate due to severity of illness, as patient continues to require IV diuresis, and is pending clearance by cardiology and pulmonology.   Dispo: The patient is from: Home              Anticipated d/c is to: Home              Anticipated d/c date is: 2 days              Patient currently is not medically stable to d/c.   Family Communication: none at bedside, will attempt to call   Consults, Procedures, Significant Events   Consultants:   Cardiology  Pulmonology  Procedures:   Right heart catheterization 03/20/2020  Antimicrobials:   None  Objective   Vitals:   03/21/20 1909 03/22/20 0348 03/22/20 0722 03/22/20 0944  BP: 120/73 (!) 132/97 (!) 140/101 130/75  Pulse: 96 73 84   Resp: 20 20 19    Temp: 97.8 F (36.6 C) (!) 97.5 F (36.4 C) 98.2 F (36.8 C)   TempSrc: Oral Oral    SpO2: 96% 93% 97%   Weight:  (!) 146 kg    Height:        Intake/Output Summary (Last 24 hours) at 03/22/2020 1054 Last data filed at 03/22/2020 0930 Gross per 24 hour  Intake 960 ml  Output 3775 ml  Net -2815 ml   Filed Weights   03/20/20 0447 03/21/20 0318 03/22/20 0348  Weight: (!) 146.3 kg (!) 148.1 kg (!) 146 kg    Physical Exam:  General exam: awake, alert, no acute distress, morbidly obese HEENT: moist mucus membranes, hearing grossly normal  Respiratory system: Decreased breath sounds, no wheezes or rhonchi, normal respiratory effort. Cardiovascular system: normal S1/S2, RRR, b/l lower extremity edema improving (no longer as tense but still pitting).   Central nervous system: A&O x3. no gross focal neurologic deficits, normal speech Extremities: moves all, normal tone Skin: dry, intact, normal temperature, normal color  Labs   Data Reviewed: I have personally reviewed following labs and imaging studies  CBC: Recent Labs  Lab 03/18/20 0843 03/22/20 0402  WBC 9.9 8.5  NEUTROABS 7.9*  --   HGB 14.4 15.2  HCT  46.6 45.8  MCV 80.2 76.8*  PLT 292 299   Basic Metabolic Panel: Recent Labs  Lab 03/18/20 0843 03/19/20 0522 03/20/20 0723 03/21/20 0620 03/22/20 0402  NA 141 141 139 143 142  K 3.5 4.1 4.2 3.9 4.1  CL 93* 92* 94* 95* 94*  CO2 36* 39* 32 35* 35*  GLUCOSE 186* 128* 171* 121* 107*  BUN 19 25* 38* 42* 39*  CREATININE 1.38* 1.79* 1.36* 1.38* 1.27*  CALCIUM 9.6 10.0 10.4* 10.4* 10.1  MG  --   --   --   --  2.3   GFR: Estimated Creatinine Clearance: 100.5 mL/min (A) (by C-G formula based on SCr of 1.27 mg/dL (H)). Liver Function Tests: No results for input(s): AST, ALT, ALKPHOS, BILITOT, PROT, ALBUMIN in the last 168  hours. No results for input(s): LIPASE, AMYLASE in the last 168 hours. No results for input(s): AMMONIA in the last 168 hours. Coagulation Profile: No results for input(s): INR, PROTIME in the last 168 hours. Cardiac Enzymes: No results for input(s): CKTOTAL, CKMB, CKMBINDEX, TROPONINI in the last 168 hours. BNP (last 3 results) No results for input(s): PROBNP in the last 8760 hours. HbA1C: No results for input(s): HGBA1C in the last 72 hours. CBG: Recent Labs  Lab 03/21/20 0721 03/21/20 1216 03/21/20 1615 03/21/20 2118 03/22/20 0728  GLUCAP 99 182* 347* 175* 124*   Lipid Profile: No results for input(s): CHOL, HDL, LDLCALC, TRIG, CHOLHDL, LDLDIRECT in the last 72 hours. Thyroid Function Tests: Recent Labs    03/20/20 1018  TSH 2.305   Anemia Panel: No results for input(s): VITAMINB12, FOLATE, FERRITIN, TIBC, IRON, RETICCTPCT in the last 72 hours. Sepsis Labs: No results for input(s): PROCALCITON, LATICACIDVEN in the last 168 hours.  Recent Results (from the past 240 hour(s))  SARS Coronavirus 2 by RT PCR (hospital order, performed in [facility] hospital lab) Nasopharyngeal Nasopharyngeal Swab     Status: None   Collection Time: 03/18/20  9:44 AM   Specimen: Nasopharyngeal Swab  Result Value Ref Range Status   SARS Coronavirus 2 NEGATIVE  NEGATIVE Final    Comment: (NOTE) SARS-CoV-2 target nucleic acids are NOT DETECTED. The SARS-CoV-2 RNA is generally detectable in upper and lower respiratory specimens during the acute phase of infection. The lowest concentration of SARS-CoV-2 viral copies this assay can detect is 250 copies / mL. A negative result does not preclude SARS-CoV-2 infection and should not be used as the sole basis for treatment or other patient management decisions.  A negative result may occur with improper specimen collection / handling, submission of specimen other than nasopharyngeal swab, presence of viral mutation(s) within the areas targeted by this assay, and inadequate number of viral copies (<250 copies / mL). A negative result must be combined with clinical observations, patient history, and epidemiological information. Fact Sheet for Patients:   StrictlyIdeas.no Fact Sheet for Healthcare Providers: BankingDealers.co.za This test is not yet approved or cleared  by the [place] FDA and has been authorized for detection and/or diagnosis of SARS-CoV-2 by FDA under an Emergency Use Authorization (EUA).  This EUA will remain in effect (meaning this test can be used) for the duration of the COVID-19 declaration under Section 564(b)(1) of the Act, 21 U.S.C. section 360bbb-3(b)(1), unless the authorization is terminated or revoked sooner. Performed at [facility], [address] [place], [place] [address]       Imaging Studies   CARDIAC CATHETERIZATION  Result Date: 03/20/2020 Conclusions: 1. Moderately elevated left heart filling pressure. 2. Severely elevated right heart and pulmonary artery pressures. 3. Normal Fick cardiac output/index. Recommendations: 1. Escalate diuresis. 2. Ongoing management of underlying lung disease (including OSA) per primary and pulmonary teams. Nelva Bush, MD [facility]     Medications    Scheduled Meds: . allopurinol  100 mg Oral Daily  . aspirin EC  81 mg Oral Daily  . carvedilol  25 mg Oral BID WC  . cholecalciferol  4,000 Units Oral Daily  . colchicine  0.3 mg Oral QODAY  . enoxaparin (LOVENOX) injection  40 mg Subcutaneous BID  . furosemide  80 mg Intravenous BID  . insulin aspart  0-20 Units Subcutaneous TID WC  . multivitamin with minerals  1 tablet Oral Daily  . sildenafil  20 mg Oral TID  .  sodium chloride flush  3 mL Intravenous Q12H  . sodium chloride flush  3 mL Intravenous Q12H   Continuous Infusions: . sodium chloride    . sodium chloride         LOS: 4 days    Time spent: 25 minutes    Pennie Banter, DO [facility]  03/22/2020, 10:54 AM    If 7PM-7AM, please contact night-coverage. How to contact the [facility] Attending or Consulting provider 7A - 7P or covering provider during after hours 7P -7A, for this patient?    1. Check the care team in [facility] and look for a) attending/consulting [facility] provider listed and b) the [facility] team listed 2. Log into [email or web address] and use [facility]'s universal password to access. If you do not have the password, please contact the hospital operator. 3. Locate the [facility] provider you are looking for under [facility] and page to a number that you can be directly reached. 4. If you still have difficulty reaching the provider, please page the [facility] (Director on Call) for the Hospitalists listed on amion for assistance.

## 2020-03-22 NOTE — Progress Notes (Signed)
Progress Note  Patient Name: Charles Rangel Date of Encounter: 03/22/2020  Primary Cardiologist: Fletcher Anon  Subjective   Breathing and lower extremity swelling continue to improve. No chest pain or palpitations. Documented UOP of 3.6 L for the past 24 hours with a net - 8.0 L for the admission. Weight 148.1 kg-->146 kg. Renal function continues to improve.   Inpatient Medications    Scheduled Meds: . allopurinol  100 mg Oral Daily  . aspirin EC  81 mg Oral Daily  . carvedilol  25 mg Oral BID WC  . cholecalciferol  4,000 Units Oral Daily  . colchicine  0.3 mg Oral QODAY  . enoxaparin (LOVENOX) injection  40 mg Subcutaneous BID  . furosemide  80 mg Intravenous BID  . insulin aspart  0-20 Units Subcutaneous TID WC  . multivitamin with minerals  1 tablet Oral Daily  . sildenafil  20 mg Oral TID  . sodium chloride flush  3 mL Intravenous Q12H  . sodium chloride flush  3 mL Intravenous Q12H   Continuous Infusions: . sodium chloride    . sodium chloride     PRN Meds: sodium chloride, sodium chloride, ipratropium-albuterol, ondansetron (ZOFRAN) IV, sodium chloride flush, sodium chloride flush, traMADol   Vital Signs    Vitals:   03/21/20 1615 03/21/20 1909 03/22/20 0348 03/22/20 0722  BP: 134/84 120/73 (!) 132/97 (!) 140/101  Pulse: 98 96 73 84  Resp: 18 20 20 19   Temp: 98 F (36.7 C) 97.8 F (36.6 C) (!) 97.5 F (36.4 C) 98.2 F (36.8 C)  TempSrc: Oral Oral Oral   SpO2: 96% 96% 93% 97%  Weight:   (!) 146 kg   Height:        Intake/Output Summary (Last 24 hours) at 03/22/2020 0801 Last data filed at 03/22/2020 0351 Gross per 24 hour  Intake 960 ml  Output 4625 ml  Net -3665 ml   Filed Weights   03/20/20 0447 03/21/20 0318 03/22/20 0348  Weight: (!) 146.3 kg (!) 148.1 kg (!) 146 kg    Telemetry    Sinus with rates in the 80s to low 100s bpm - Personally Reviewed  ECG    n/a - Personally Reviewed  Physical Exam   GEN: No acute distress.   Neck: JVD  difficult to assess secondary to body habitus. Cardiac: RRR, no murmurs, rubs, or gallops.  Respiratory: Improving breath sounds bilaterally with mildly diminished breath sounds along the bilateral bases.  GI: Soft, nontender, non-distended.   MS: Improving bilateral lower extremity edema with changes consistent with chronic woody edema; No deformity. Neuro:  Alert and oriented x 3; Nonfocal.  Psych: Normal affect.  Labs    Chemistry Recent Labs  Lab 03/20/20 0723 03/21/20 0620 03/22/20 0402  NA 139 143 142  K 4.2 3.9 4.1  CL 94* 95* 94*  CO2 32 35* 35*  GLUCOSE 171* 121* 107*  BUN 38* 42* 39*  CREATININE 1.36* 1.38* 1.27*  CALCIUM 10.4* 10.4* 10.1  GFRNONAA >60 >60 >60  GFRAA >60 >60 >60  ANIONGAP 13 13 13      Hematology Recent Labs  Lab 03/18/20 0843 03/22/20 0402  WBC 9.9 8.5  RBC 5.81 5.96*  HGB 14.4 15.2  HCT 46.6 45.8  MCV 80.2 76.8*  MCH 24.8* 25.5*  MCHC 30.9 33.2  RDW 18.1* 18.0*  PLT 292 299    Cardiac EnzymesNo results for input(s): TROPONINI in the last 168 hours. No results for input(s): TROPIPOC in the last 168  hours.   BNP Recent Labs  Lab 03/18/20 0843  BNP 68.1     DDimer No results for input(s): DDIMER in the last 168 hours.   Radiology    CXR 03/18/2020: IMPRESSION: Cardiomegaly with apparent degree of pulmonary arterial hypertension and vascular congestion. No frank edema or airspace consolidation. Appearance similar to most recent prior studies.  Cardiac Studies   RHC 03/20/2020: Conclusions: 1. Moderately elevated left heart filling pressure. 2. Severely elevated right heart and pulmonary artery pressures. 3. Normal Fick cardiac output/index.  Recommendations: 1. Escalate diuresis. 2. Ongoing management of underlying lung disease (including OSA) per primary and pulmonary teams. __________  2D echo 12/2019: 1. Left ventricular ejection fraction, by estimation, is 60 to 65%. The  left ventricle has normal function. The  left ventricle has no regional  wall motion abnormalities. There is moderate left ventricular hypertrophy.  Left ventricular diastolic  parameters are indeterminate.  2. Right ventricular systolic function is normal. The right ventricular  size is normal.  3. Left atrial size was mildly dilated.   Patient Profile     44 y.o. male with history of HFpEF, pulmonary hypertension, COPD, DM2, remote DVT of the right lower extremity in the setting of prolonged travel, remote tobacco use, HTN, morbid obesity, OSAwith intermittent adherence of CPAP, and goutwho is being seen today for the evaluation of pulmonary hypertension.   Assessment & Plan    1. Acute on chronic HFpEF/severe pulmonary hypertension: -Prior CTA of the chest from 12/2019 negative for PE -With the patient's history of prior DVT consider VQ scan to evaluate for chronic PE -Likely multifactorial etiology including underlying COPD, HFpEF, morbid obesity with OSA and likely OHS along with noncompliance with medical therapy -RHC 6/1 with severely elevated pressure as above -Continue IV Lasix 80 mg twice daily until there is evidence of intravascular volume depletion, such as a bump in his renal function  -Sildenafil  -Daily weights -Strict I's and O's  2. Elevated HS-Tn: -Minimal elevated and not consistent with ACS -No symptoms of chest pain -No plans for inpatient ischemic evaluation at this time  3. Acute on CKD stage II: -Renal function improving -Monitor with diuresis   4. HTN: -Blood pressure has been reasonably controlled, though is mildly elevated this morning -Coreg, Lasix, sildenafil   5. Morbid obesity with OSA and likely OHS: -Weight loss advised  -BiPAP while sleeping  For questions or updates, please contact [facility] Please consult [email or web address] for contact info under Cardiology/STEMI.    Signed, Eula Listen, PA-C [facility] Pager: [phone] 03/22/2020, 8:01 AM

## 2020-03-23 ENCOUNTER — Telehealth: Payer: Self-pay

## 2020-03-23 LAB — BASIC METABOLIC PANEL
Anion gap: 10 (ref 5–15)
BUN: 36 mg/dL — ABNORMAL HIGH (ref 6–20)
CO2: 37 mmol/L — ABNORMAL HIGH (ref 22–32)
Calcium: 10.1 mg/dL (ref 8.9–10.3)
Chloride: 94 mmol/L — ABNORMAL LOW (ref 98–111)
Creatinine, Ser: 1.35 mg/dL — ABNORMAL HIGH (ref 0.61–1.24)
GFR calc Af Amer: >60 mL/min (ref >60)
GFR calc non Af Amer: >60 mL/min (ref >60)
Glucose, Bld: 122 mg/dL — ABNORMAL HIGH (ref 70–99)
Potassium: 3.9 mmol/L (ref 3.5–5.1)
Sodium: 141 mmol/L (ref 135–145)

## 2020-03-23 LAB — GLUCOSE, CAPILLARY
Glucose-Capillary: 136 mg/dL — ABNORMAL HIGH (ref 70–99)
Glucose-Capillary: 170 mg/dL — ABNORMAL HIGH (ref 70–99)

## 2020-03-23 MED ORDER — TORSEMIDE 20 MG PO TABS: 40.0000 mg | ORAL_TABLET | Freq: Two times a day (BID) | ORAL | Status: DC

## 2020-03-23 MED ORDER — TORSEMIDE 20 MG PO TABS: 40.0000 mg | ORAL_TABLET | Freq: Two times a day (BID) | ORAL | 1 refills | Status: DC

## 2020-03-23 MED ORDER — SILDENAFIL CITRATE 20 MG PO TABS: 20.0000 mg | ORAL_TABLET | Freq: Three times a day (TID) | ORAL | 0 refills | Status: DC

## 2020-03-23 NOTE — Plan of Care (Addendum)
Per patient has already seen EMI videos on CHF. Educated on the importance of daily weight, low sodium diet, and medication adherence. Pt sitting up in recliner, no complaints. Problem: Education: Goal: Knowledge of General Education information will improve Description: Including pain rating scale, medication(s)/side effects and non-pharmacologic comfort measures Outcome: Progressing   Problem: Health Behavior/Discharge Planning: Goal: Ability to manage health-related needs will improve Outcome: Progressing   Problem: Nutrition: Goal: Adequate nutrition will be maintained Outcome: Progressing   Problem: Pain Managment: Goal: General experience of comfort will improve Outcome: Progressing   Problem: Education: Goal: Ability to demonstrate management of disease process will improve Outcome: Progressing

## 2020-03-23 NOTE — Telephone Encounter (Signed)
-----   Message from Muhammad A Arida, MD sent at 03/23/2020 11:34 AM EDT ----- Possible discharge home from [facility] today. TCM follow-up with APP needed in 1 to 2 weeks with basic metabolic profile recommended in 1 week.  

## 2020-03-23 NOTE — Progress Notes (Signed)
Pulmonary Medicine          Date: 03/23/2020,   MRN# [id number] Charles Rangel [birth year]     AdmissionWeight: (!) 144.2 kg                 CurrentWeight: (!) 145.9 kg  Referring physician: Dr. Algie Coffer    CHIEF COMPLAINT:   New onset pulmonary hypertension   SUBJECTIVE    Patient is clinically improved.   Patient is now on room air with spO2>95%  Patient is negative 11 Liters.  Patient has habit of polydipsia , have counseled him regarding this, recommend <40 oz/24h fluid intake on outpatient.        PFT DATA:  - significant spirometric improvement post diuresis, recruitment maneuvers and treatment of PH.      PAST MEDICAL HISTORY   Past Medical History:  Diagnosis Date   (HFpEF) heart failure with preserved ejection fraction ([name])    a. 02/2018 Echo: EF 65-70%, mildly dil LA.   CHF (congestive heart failure) (HCC)    COPD (chronic obstructive pulmonary disease) (HCC)    Diabetes mellitus without complication (HCC)    DVT (deep venous thrombosis) (HCC)    Gout    Hypertension    Morbid obesity (HCC)    OSA (obstructive sleep apnea)      SURGICAL HISTORY   Past Surgical History:  Procedure Laterality Date   Left leg surgery for fracture     RIGHT HEART CATH N/A 03/20/2020   Procedure: RIGHT HEART CATH;  Surgeon: Nelva Bush, MD;  Location: [facility];  Service: Cardiovascular;  Laterality: N/A;     FAMILY HISTORY   Family History  Problem Relation Age of Onset   Hypertension Mother    Hypertension Father    CVA Father 63     SOCIAL HISTORY   Social History   Tobacco Use   Smoking status: Former Smoker    Packs/day: 1.00    Years: 20.00    Pack years: 20.00    Quit date: 10/20/2010    Years since quitting: 9.4   Smokeless tobacco: Never Used  Substance Use Topics   Alcohol use: Not Currently   Drug use: No     MEDICATIONS    Home Medication:    Current Medication:  Current  Facility-Administered Medications:    0.9 %  sodium chloride infusion, 250 mL, Intravenous, PRN, End, [name], MD   allopurinol (ZYLOPRIM) tablet 100 mg, 100 mg, Oral, Daily, End, [name], MD, 100 mg at 03/23/20 0807   aspirin EC tablet 81 mg, 81 mg, Oral, Daily, End, [name], MD, 81 mg at 03/23/20 0806   carvedilol (COREG) tablet 25 mg, 25 mg, Oral, BID WC, End, [name], MD, 25 mg at 03/23/20 0805   cholecalciferol (VITAMIN D3) tablet 4,000 Units, 4,000 Units, Oral, Daily, End, [name], MD, 4,000 Units at 03/23/20 0805   colchicine tablet 0.3 mg, 0.3 mg, Oral, QODAY, End, [name], MD, 0.3 mg at 03/22/20 0758   enoxaparin (LOVENOX) injection 40 mg, 40 mg, Subcutaneous, BID, End, [name], MD, 40 mg at 03/23/20 0806   furosemide (LASIX) injection 80 mg, 80 mg, Intravenous, BID, End, [name], MD, 80 mg at 03/23/20 0806   insulin aspart (novoLOG) injection 0-20 Units, 0-20 Units, Subcutaneous, TID WC, End, [name], MD, 3 Units at 03/23/20 0805   ipratropium-albuterol (DUONEB) 0.5-2.5 (3) MG/3ML nebulizer solution 3 mL, 3 mL, Nebulization, Q6H PRN, [name], [name], MD   loratadine (CLARITIN) tablet 10 mg, 10 mg, Oral,  Daily, [name], DO, 10 mg at 03/23/20 5329   multivitamin with minerals tablet 1 tablet, 1 tablet, Oral, Daily, End, [name], MD, 1 tablet at 03/23/20 0805   ondansetron (ZOFRAN) injection 4 mg, 4 mg, Intravenous, Q6H PRN, End, [name], MD   sildenafil (REVATIO) tablet 20 mg, 20 mg, Oral, TID, [name], MD, 20 mg at 03/23/20 9242   sodium chloride flush (NS) 0.9 % injection 3 mL, 3 mL, Intravenous, Q12H, End, [name], MD, 3 mL at 03/23/20 6834   sodium chloride flush (NS) 0.9 % injection 3 mL, 3 mL, Intravenous, PRN, End, [name], MD   traMADol Janean Sark) tablet 50 mg, 50 mg, Oral, Q6H PRN, End, [name], MD, 50 mg at 03/18/20 2158    ALLERGIES   Tylenol [acetaminophen], Dog epithelium allergy skin  test, Apple, and Other     REVIEW OF SYSTEMS    Review of Systems:  Gen:  Denies  fever, sweats, chills weigh loss  HEENT: Denies blurred vision, double vision, ear pain, eye pain, hearing loss, nose bleeds, sore throat Cardiac:  No dizziness, chest pain or heaviness, chest tightness,edema Resp:   Denies cough or sputum porduction, shortness of breath,wheezing, hemoptysis,  Gi: Denies swallowing difficulty, stomach pain, nausea or vomiting, diarrhea, constipation, bowel incontinence Gu:  Denies bladder incontinence, burning urine Ext:   Denies Joint pain, stiffness or swelling Skin: Denies  skin rash, easy bruising or bleeding or hives Endoc:  Denies polyuria, polydipsia , polyphagia or weight change Psych:   Denies depression, insomnia or hallucinations   Other:  All other systems negative   VS: BP (!) 137/105 (BP Location: Left Arm)    Pulse 91    Temp 98 F (36.7 C) (Oral)    Resp 19    Ht 5\' 6"  (1.676 m)    Wt (!) 145.9 kg    SpO2 95%    BMI 51.91 kg/m      PHYSICAL EXAM    GENERAL:NAD, no fevers, chills, no weakness no fatigue HEAD: Normocephalic, atraumatic.  EYES: Pupils equal, round, reactive to light. Extraocular muscles intact. No scleral icterus.  MOUTH: Moist mucosal membrane. Dentition intact. No abscess noted.  EAR, NOSE, THROAT: Clear without exudates. No external lesions.  NECK: Supple. No thyromegaly. No nodules. No JVD.  PULMONARY: Mild crackles at bases bilaterally with decreased breath sounds bilaterally  CARDIOVASCULAR: S1 and S2. Regular rate and rhythm. No murmurs, rubs, or gallops. No edema. Pedal pulses 2+ bilaterally.  GASTROINTESTINAL: Soft, nontender, nondistended. No masses. Positive bowel sounds. No hepatosplenomegaly.  MUSCULOSKELETAL: No swelling, clubbing, or edema. Range of motion full in all extremities.  NEUROLOGIC: Cranial nerves II through XII are intact. No gross focal neurological deficits. Sensation intact. Reflexes intact.  SKIN:  No ulceration, lesions, rashes, or cyanosis. Skin warm and dry. Turgor intact.  PSYCHIATRIC: Mood, affect within normal limits. The patient is awake, alert and oriented x 3. Insight, judgment intact.       IMAGING    CARDIAC CATHETERIZATION  Result Date: 03/20/2020 Conclusions: 1. Moderately elevated left heart filling pressure. 2. Severely elevated right heart and pulmonary artery pressures. 3. Normal Fick cardiac output/index. Recommendations: 1. Escalate diuresis. 2. Ongoing management of underlying lung disease (including OSA) per primary and pulmonary teams. 05/20/2020, MD [facility]   DG Chest Portable 1 View  Result Date: 03/18/2020 CLINICAL DATA:  Shortness of breath EXAM: PORTABLE CHEST 1 VIEW COMPARISON:  December 22, 2019 chest radiograph and chest CT FINDINGS: There is cardiomegaly with prominence of  the main pulmonary artery suggesting a degree of underlying pulmonary arterial hypertension. There is interstitial prominence, likely due to a degree of vascular congestion. No edema or airspace opacity evident. No adenopathy. No bone lesions. IMPRESSION: Cardiomegaly with apparent degree of pulmonary arterial hypertension and vascular congestion. No frank edema or airspace consolidation. Appearance similar to most recent prior studies. Electronically Signed   By: Bretta Bang III M.D.   On: 03/18/2020 08:36      ASSESSMENT/PLAN   Acute hypoxemic respiratory failure   -Multifactorial in etiology-including diastolic CHF,Moribd obesity,  severe obstructive sleep apnea, pickwickian physiology,deconditioning,  significant pulmonary hypertension of undetermined severity.  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    Patient/Family are satisfied with care plan and all questions have been answered.  This document was prepared using Dragon voice recognition software and may include unintentional dictation errors.     [name], M.D.  Division of Pulmonary & Critical Care Medicine  [facility]

## 2020-03-23 NOTE — Discharge Summary (Signed)
Physician Discharge Summary  Farid Grigorian GUR:[id number] DOB: August 13, 1976 DOA: 03/18/2020  [facility]: [facility]  Admit date: 03/18/2020 Discharge date: 03/23/2020  Admitted From: home Disposition:  home  Recommendations for Outpatient Follow-up:  1. Follow up with PCP in 1-2 weeks 2. Please obtain BMP/CBC in one week 3. Please follow up with cardiology as scheduled 4. Please follow up with pulmonology as scheduled  Home Health: No, recommended but patient declined  Equipment/Devices: Trilogy NIV   Discharge Condition: Stable  CODE STATUS: Full  Diet recommendation: Heart Healthy / Carb Modified   Discharge Diagnoses: Principal Problem:   Acute on chronic heart failure with preserved ejection fraction (HFpEF) (HCC) Active Problems:   COPD with acute exacerbation (HCC)   Pulmonary hypertension, unspecified (HCC)   Acute on chronic respiratory failure with hypoxia and hypercapnia (HCC)   HTN (hypertension)   DM (diabetes mellitus) (HCC)   CKD stage 2 due to type 2 diabetes mellitus (HCC)   Morbid obesity with BMI of 50.0-59.9, adult (HCC)    Summary of HPI and Hospital Course:  44 year old male with history of chronic HFpEF, pulmonary hypertension, COPD, type 2 diabetes mellitus, remote DVT, hypertension, morbid obesity, obstructive sleep apnea with intermittent CPAP use, gout, and prior tobacco use who presented to the ED on 03/18/20 for progressive shortness of breath over past week.  He had worsened to having shortness of breath at rest, nonproductive cough, 4 lb weight gain.  In the ED, chest xray showed cardiomegaly and vascular congestion, dilated pulmonary artery suggesting pulmonary hypertension, no infiltrates.  ECG showed sinus tachycardia.  Patient admitted to hospitalist service for further evaluation and management of multifactorial acute on chronic respiratory failure with hypoxia.  Pulmonology and cardiology consulted.    Acute on chronic HFpEF / Severe  Pulmonary Hypertension - present on admission.  Echo showed EF 55-60%.  Cardiology consulted.  Diuresed with IV Lasix 80 mg BID.  Net fluid -12.4 L for the admission.    Pulmonary hypertension, unspecified - secondary to COPD, OSA, obesity, HFpEF.  Right heart cath on 03/20/20 showed severe pulmonary HTN with PA systolic pressure of 70, mean 55 mmHg.  Pulmonology consulted.  Started on Revatio on 03/20/20.Marland Kitchen  Acute on chronic respiratory failure with hypoxia and hypercapnia - multifactorial, due to severe PAH, COPD, OSA, morbid obesity.  Supplemental O2 as needed.  Home NIV arranged and will be delivered to patient home by [facility] agency.   Acute Kidney Injury - secondary to diuresis most likely.  Improved.  Presented with Cr 1.38 (near recent baseline) >> 1.79 >> 1.36 >>1.38>>1.27.  Follow up BMP in 1-2 weeks.  Allergic rhinitis - new. Denies history of seasonal allergies. Reported sneezing and itchy nose today (6/3).  Trial loratadine.  COPD with acute exacerbation - present on admission, improved.  Completed 3 days IV steroids.  Not wheezing.  Continued on Duonebs q6h PRN.  Hypertension - uncontrolled on admission, improved.    Type 2 Diabetes - well controlled.  A1c is 5.4%.  Covered with sliding scale Novolog TID WC.  Metformin resumed on discharge.  CKD stage 2 due to type 2 diabetes mellitus - renal function appears close to recent baseline.  Monitor.  Avoid nephrotoxic agents and hypotension.  Renally dose meds as indicated.     Gout - not acutely flared.  Continue allopurinol.  Morbid Obesity - Body mass index is 51.96 kg/m.   This complicates overall care and prognosis.   Discharge Instructions   Discharge Instructions    (  HEART FAILURE PATIENTS) Call MD:  Anytime you have any of the following symptoms: 1) 3 pound weight gain in 24 hours or 5 pounds in 1 week 2) shortness of breath, with or without a dry hacking cough 3) swelling in the hands, feet or stomach 4) if you have to  sleep on extra pillows at night in order to breathe.   Complete by: As directed    Call MD for:  extreme fatigue   Complete by: As directed    Call MD for:  persistant dizziness or light-headedness   Complete by: As directed    Call MD for:  temperature >100.4   Complete by: As directed    Diet - low sodium heart healthy   Complete by: As directed    Increase activity slowly   Complete by: As directed      Allergies as of 03/23/2020      Reactions   Tylenol [acetaminophen] Anaphylaxis   Dog Epithelium Allergy Skin Test Hives   Apple Hives   Other Hives   RAISINS      Medication List    STOP taking these medications   bumetanide 2 MG tablet Commonly known as: BUMEX   furosemide 80 MG tablet Commonly known as: Lasix   lisinopril 40 MG tablet Commonly known as: ZESTRIL     TAKE these medications   albuterol 108 (90 Base) MCG/ACT inhaler Commonly known as: VENTOLIN HFA Inhale 2 puffs into the lungs every 6 (six) hours as needed for wheezing or shortness of breath.   allopurinol 100 MG tablet Commonly known as: ZYLOPRIM Take 1 tablet (100 mg total) by mouth daily.   aspirin EC 81 MG tablet Take 81 mg daily by mouth.   carvedilol 25 MG tablet Commonly known as: COREG Take 1 tablet (25 mg total) by mouth 2 (two) times daily with a meal.   colchicine 0.6 MG tablet Take 0.5 tablets (0.3 mg total) by mouth every other day.   metFORMIN 500 MG tablet Commonly known as: GLUCOPHAGE Take 1,000 mg by mouth 2 (two) times daily with a meal.   multivitamin with minerals Tabs tablet Take 1 tablet by mouth daily.   sildenafil 20 MG tablet Commonly known as: REVATIO Take 1 tablet (20 mg total) by mouth 3 (three) times daily.   torsemide 20 MG tablet Commonly known as: DEMADEX Take 2 tablets (40 mg total) by mouth 2 (two) times daily.   Vitamin D3 25 MCG (1000 UT) Caps Take 4,000 Units by mouth daily.            Durable Medical Equipment  (From admission, onward)          Start     Ordered   03/21/20 0733  For home use only DME oxygen  Once    Question Answer Comment  Length of Need Lifetime   Mode or (Route) Nasal cannula   Liters per Minute 2   Oxygen delivery system Gas      03/21/20 0732         Follow-up Information    [facility] Follow up on 03/26/2020.   Specialty: Cardiology Why: at 8:30am. Enter through the [facility] entrance Contact information: [place]         Allergies  Allergen Reactions  . Tylenol [Acetaminophen] Anaphylaxis  . Dog Epithelium Allergy Skin Test Hives  . Apple Hives  . Other Hives  RAISINS    Consultations:  Cardiology  Pulmonology   Procedures/Studies: CARDIAC CATHETERIZATION  Result Date: 03/20/2020 Conclusions: 1. Moderately elevated left heart filling pressure. 2. Severely elevated right heart and pulmonary artery pressures. 3. Normal Fick cardiac output/index. Recommendations: 1. Escalate diuresis. 2. Ongoing management of underlying lung disease (including OSA) per primary and pulmonary teams. Yvonne Kendallhristopher End, MD [facility]   DG Chest Portable 1 View  Result Date: 03/18/2020 CLINICAL DATA:  Shortness of breath EXAM: PORTABLE CHEST 1 VIEW COMPARISON:  December 22, 2019 chest radiograph and chest CT FINDINGS: There is cardiomegaly with prominence of the main pulmonary artery suggesting a degree of underlying pulmonary arterial hypertension. There is interstitial prominence, likely due to a degree of vascular congestion. No edema or airspace opacity evident. No adenopathy. No bone lesions. IMPRESSION: Cardiomegaly with apparent degree of pulmonary arterial hypertension and vascular congestion. No frank edema or airspace consolidation. Appearance similar to most recent prior studies. Electronically Signed   By: Bretta BangWilliam  Woodruff III M.D.   On: 03/18/2020 08:36        Subjective: Patient seen up in chair.  Reports he feels well.  Denies SOB or CP.  Happy to be going home.   Discharge Exam: Vitals:   03/23/20 0717 03/23/20 1108  BP: (!) 137/105 125/74  Pulse: 91 93  Resp: 19 19  Temp: 98 F (36.7 C) 98.3 F (36.8 C)  SpO2: 95% 97%   Vitals:   03/22/20 1939 03/23/20 0348 03/23/20 0717 03/23/20 1108  BP: 105/78 (!) 139/97 (!) 137/105 125/74  Pulse: 89 78 91 93  Resp: 20 19 19 19   Temp: 98 F (36.7 C) (!) 97.5 F (36.4 C) 98 F (36.7 C) 98.3 F (36.8 C)  TempSrc: Oral Oral Oral Oral  SpO2: 96% 96% 95% 97%  Weight:  (!) 145.9 kg    Height:        General: Pt is alert, awake, not in acute distress, morbidly obese Cardiovascular: RRR (faint heart sounds due to body habitus), S1/S2 +, no rubs, no gallops Respiratory: CTA bilaterally (diminsinished due to body habitus), no wheezing, no rhonchi Abdominal: Soft, NT, ND, bowel sounds + Extremities: improved lower extremity pitting edema, no cyanosis    The results of significant diagnostics from this hospitalization (including imaging, microbiology, ancillary and laboratory) are listed below for reference.     Microbiology: Recent Results (from the past 240 hour(s))  SARS Coronavirus 2 by RT PCR (hospital order, performed in [facility]) Nasopharyngeal Nasopharyngeal Swab     Status: None   Collection Time: 03/18/20  9:44 AM   Specimen: Nasopharyngeal Swab  Result Value Ref Range Status   SARS Coronavirus 2 NEGATIVE NEGATIVE Final    Comment: (NOTE) SARS-CoV-2 target nucleic acids are NOT DETECTED. The SARS-CoV-2 RNA is generally detectable in upper and lower respiratory specimens during the acute phase of infection. The lowest concentration of SARS-CoV-2 viral copies this assay can detect is 250 copies / mL. A negative result does not preclude SARS-CoV-2 infection and should not be used as the sole basis for treatment or other patient management decisions.  A  negative result may occur with improper specimen collection / handling, submission of specimen other than nasopharyngeal swab, presence of viral mutation(s) within the areas targeted by this assay, and inadequate number of viral copies (<250 copies / mL). A negative result must be combined with clinical observations, patient history, and epidemiological information. Fact Sheet for Patients:   BoilerBrush.com.cyhttps://www.fda.gov/media/136312/download Fact Sheet for Healthcare Providers:  https://pope.com/ This test is not yet approved or cleared  by the [place] and has been authorized for detection and/or diagnosis of SARS-CoV-2 by FDA under an Emergency Use Authorization (EUA).  This EUA will remain in effect (meaning this test can be used) for the duration of the COVID-19 declaration under Section 564(b)(1) of the Act, 21 U.S.C. section 360bbb-3(b)(1), unless the authorization is terminated or revoked sooner. Performed at [facility], [address] [place], [place] [address]      Labs: BNP (last 3 results) Recent Labs    12/22/19 0755 03/18/20 0843  BNP 232.0* 68.1   Basic Metabolic Panel: Recent Labs  Lab 03/19/20 0522 03/20/20 0723 03/21/20 0620 03/22/20 0402 03/23/20 0354  NA 141 139 143 142 141  K 4.1 4.2 3.9 4.1 3.9  CL 92* 94* 95* 94* 94*  CO2 39* 32 35* 35* 37*  GLUCOSE 128* 171* 121* 107* 122*  BUN 25* 38* 42* 39* 36*  CREATININE 1.79* 1.36* 1.38* 1.27* 1.35*  CALCIUM 10.0 10.4* 10.4* 10.1 10.1  MG  --   --   --  2.3  --    Liver Function Tests: No results for input(s): AST, ALT, ALKPHOS, BILITOT, PROT, ALBUMIN in the last 168 hours. No results for input(s): LIPASE, AMYLASE in the last 168 hours. No results for input(s): AMMONIA in the last 168 hours. CBC: Recent Labs  Lab 03/18/20 0843 03/22/20 0402  WBC 9.9 8.5  NEUTROABS 7.9*  --   HGB 14.4 15.2  HCT 46.6 45.8  MCV 80.2 76.8*  PLT 292 299   Cardiac Enzymes: No  results for input(s): CKTOTAL, CKMB, CKMBINDEX, TROPONINI in the last 168 hours. BNP: Invalid input(s): POCBNP CBG: Recent Labs  Lab 03/22/20 1123 03/22/20 1636 03/22/20 2053 03/23/20 0718 03/23/20 1108  GLUCAP 147* 154* 186* 136* 170*   D-Dimer No results for input(s): DDIMER in the last 72 hours. Hgb A1c No results for input(s): HGBA1C in the last 72 hours. Lipid Profile No results for input(s): CHOL, HDL, LDLCALC, TRIG, CHOLHDL, LDLDIRECT in the last 72 hours. Thyroid function studies No results for input(s): TSH, T4TOTAL, T3FREE, THYROIDAB in the last 72 hours.  Invalid input(s): FREET3 Anemia work up No results for input(s): VITAMINB12, FOLATE, FERRITIN, TIBC, IRON, RETICCTPCT in the last 72 hours. Urinalysis    Component Value Date/Time   COLORURINE YELLOW (A) 08/27/2017 0816   APPEARANCEUR CLEAR (A) 08/27/2017 0816   APPEARANCEUR Clear 07/28/2014 2317   LABSPEC 1.011 08/27/2017 0816   LABSPEC 1.024 07/28/2014 2317   PHURINE 6.0 08/27/2017 0816   GLUCOSEU NEGATIVE 08/27/2017 0816   GLUCOSEU Negative 07/28/2014 2317   HGBUR NEGATIVE 08/27/2017 0816   BILIRUBINUR NEGATIVE 08/27/2017 0816   BILIRUBINUR Negative 07/28/2014 2317   KETONESUR NEGATIVE 08/27/2017 0816   PROTEINUR NEGATIVE 08/27/2017 0816   NITRITE NEGATIVE 08/27/2017 0816   LEUKOCYTESUR NEGATIVE 08/27/2017 0816   LEUKOCYTESUR Negative 07/28/2014 2317   Sepsis Labs Invalid input(s): PROCALCITONIN,  WBC,  LACTICIDVEN Microbiology Recent Results (from the past 240 hour(s))  SARS Coronavirus 2 by RT PCR (hospital order, performed in [facility] hospital lab) Nasopharyngeal Nasopharyngeal Swab     Status: None   Collection Time: 03/18/20  9:44 AM   Specimen: Nasopharyngeal Swab  Result Value Ref Range Status   SARS Coronavirus 2 NEGATIVE NEGATIVE Final    Comment: (NOTE) SARS-CoV-2 target nucleic acids are NOT DETECTED. The SARS-CoV-2 RNA is generally detectable in upper and lower respiratory  specimens during the acute phase  of infection. The lowest concentration of SARS-CoV-2 viral copies this assay can detect is 250 copies / mL. A negative result does not preclude SARS-CoV-2 infection and should not be used as the sole basis for treatment or other patient management decisions.  A negative result may occur with improper specimen collection / handling, submission of specimen other than nasopharyngeal swab, presence of viral mutation(s) within the areas targeted by this assay, and inadequate number of viral copies (<250 copies / mL). A negative result must be combined with clinical observations, patient history, and epidemiological information. Fact Sheet for Patients:   BoilerBrush.com.cy Fact Sheet for Healthcare Providers: https://pope.com/ This test is not yet approved or cleared  by the [place] FDA and has been authorized for detection and/or diagnosis of SARS-CoV-2 by FDA under an Emergency Use Authorization (EUA).  This EUA will remain in effect (meaning this test can be used) for the duration of the COVID-19 declaration under Section 564(b)(1) of the Act, 21 U.S.C. section 360bbb-3(b)(1), unless the authorization is terminated or revoked sooner. Performed at [facility], [address] [place], [place] [address]      Time coordinating discharge: Over 30 minutes  SIGNED:   Pennie Banter, DO [facility] 03/23/2020, 12:35 PM   If 7PM-7AM, please contact night-coverage [email or web address]

## 2020-03-23 NOTE — Progress Notes (Signed)
Progress Note  Patient Name: Charles Rangel Date of Encounter: 03/23/2020  Primary Cardiologist: Kirke Corin  Subjective   Significant improvement in leg edema and shortness of breath.  He is no longer requiring oxygen.  He is -12.4 L for the admission.  Inpatient Medications    Scheduled Meds: . allopurinol  100 mg Oral Daily  . aspirin EC  81 mg Oral Daily  . carvedilol  25 mg Oral BID WC  . cholecalciferol  4,000 Units Oral Daily  . colchicine  0.3 mg Oral QODAY  . enoxaparin (LOVENOX) injection  40 mg Subcutaneous BID  . insulin aspart  0-20 Units Subcutaneous TID WC  . loratadine  10 mg Oral Daily  . multivitamin with minerals  1 tablet Oral Daily  . sildenafil  20 mg Oral TID  . sodium chloride flush  3 mL Intravenous Q12H  . torsemide  40 mg Oral BID   Continuous Infusions: . sodium chloride     PRN Meds: sodium chloride, ipratropium-albuterol, ondansetron (ZOFRAN) IV, sodium chloride flush, traMADol   Vital Signs    Vitals:   03/22/20 1939 03/23/20 0348 03/23/20 0717 03/23/20 1108  BP: 105/78 (!) 139/97 (!) 137/105 125/74  Pulse: 89 78 91 93  Resp: 20 19 19 19   Temp: 98 F (36.7 C) (!) 97.5 F (36.4 C) 98 F (36.7 C) 98.3 F (36.8 C)  TempSrc: Oral Oral Oral Oral  SpO2: 96% 96% 95% 97%  Weight:  (!) 145.9 kg    Height:        Intake/Output Summary (Last 24 hours) at 03/23/2020 1126 Last data filed at 03/23/2020 1109 Gross per 24 hour  Intake 720 ml  Output 3950 ml  Net -3230 ml   Filed Weights   03/21/20 0318 03/22/20 0348 03/23/20 0348  Weight: (!) 148.1 kg (!) 146 kg (!) 145.9 kg    Telemetry    Sinus with rates in the 80s to low 100s bpm - Personally Reviewed  ECG    n/a - Personally Reviewed  Physical Exam   GEN: No acute distress.   Neck: JVD difficult to assess secondary to body habitus. Cardiac: RRR, no murmurs, rubs, or gallops.  Respiratory: Improving breath sounds bilaterally with mildly diminished breath sounds along the  bilateral bases.  GI: Soft, nontender, non-distended.   MS: Improving bilateral lower extremity edema with changes consistent with chronic woody edema; No deformity. Neuro:  Alert and oriented x 3; Nonfocal.  Psych: Normal affect.  Labs    Chemistry Recent Labs  Lab 03/21/20 0620 03/22/20 0402 03/23/20 0354  NA 143 142 141  K 3.9 4.1 3.9  CL 95* 94* 94*  CO2 35* 35* 37*  GLUCOSE 121* 107* 122*  BUN 42* 39* 36*  CREATININE 1.38* 1.27* 1.35*  CALCIUM 10.4* 10.1 10.1  GFRNONAA >60 >60 >60  GFRAA >60 >60 >60  ANIONGAP 13 13 10      Hematology Recent Labs  Lab 03/18/20 0843 03/22/20 0402  WBC 9.9 8.5  RBC 5.81 5.96*  HGB 14.4 15.2  HCT 46.6 45.8  MCV 80.2 76.8*  MCH 24.8* 25.5*  MCHC 30.9 33.2  RDW 18.1* 18.0*  PLT 292 299    Cardiac EnzymesNo results for input(s): TROPONINI in the last 168 hours. No results for input(s): TROPIPOC in the last 168 hours.   BNP Recent Labs  Lab 03/18/20 0843  BNP 68.1     DDimer No results for input(s): DDIMER in the last 168 hours.   Radiology  CXR 03/18/2020: IMPRESSION: Cardiomegaly with apparent degree of pulmonary arterial hypertension and vascular congestion. No frank edema or airspace consolidation. Appearance similar to most recent prior studies.  Cardiac Studies   RHC 03/20/2020: Conclusions: 1. Moderately elevated left heart filling pressure. 2. Severely elevated right heart and pulmonary artery pressures. 3. Normal Fick cardiac output/index.  Recommendations: 1. Escalate diuresis. 2. Ongoing management of underlying lung disease (including OSA) per primary and pulmonary teams. __________  2D echo 12/2019: 1. Left ventricular ejection fraction, by estimation, is 60 to 65%. The  left ventricle has normal function. The left ventricle has no regional  wall motion abnormalities. There is moderate left ventricular hypertrophy.  Left ventricular diastolic  parameters are indeterminate.  2. Right ventricular  systolic function is normal. The right ventricular  size is normal.  3. Left atrial size was mildly dilated.   Patient Profile     44 y.o. male with history of HFpEF, pulmonary hypertension, COPD, DM2, remote DVT of the right lower extremity in the setting of prolonged travel, remote tobacco use, HTN, morbid obesity, OSAwith intermittent adherence of CPAP, and goutwho is being seen today for the evaluation of pulmonary hypertension.   Assessment & Plan    1. Acute on chronic HFpEF/severe pulmonary hypertension: -Prior CTA of the chest from 12/2019 negative for PE -Likely multifactorial etiology including underlying COPD, HFpEF, morbid obesity with OSA and likely OHS along with noncompliance with medical therapy -RHC 6/1 with severely elevated pressure as above -Volume overload improved significantly. I switched furosemide to torsemide 40 mg twice daily and will arrange for basic metabolic profile in 1 week. -Continue sildenafil which was started by pulmonary.   2. Elevated HS-Tn: -Minimal elevated and not consistent with ACS -No symptoms of chest pain -No plans for inpatient ischemic evaluation at this time  3. Acute on CKD stage II: Renal function is stable.  4. HTN: -Blood pressure is reasonably controlled on carvedilol  5. Morbid obesity with OSA and likely OHS: -Weight loss advised  -BiPAP while sleeping  The patient is likely stable for discharge. Will arrange for follow-up and basic metabolic profile in our office in 1 week.  For questions or updates, please contact [place] Please consult [email or web address] for contact info under Cardiology/STEMI.    Signed, Kathlyn Sacramento, MD [facility] 03/23/2020, 11:26 AM

## 2020-03-23 NOTE — Telephone Encounter (Signed)
Awaiting scheduling. 

## 2020-03-23 NOTE — Plan of Care (Signed)
  Problem: Education: Goal: Knowledge of General Education information will improve Description: Including pain rating scale, medication(s)/side effects and non-pharmacologic comfort measures 03/23/2020 1532 by [name], RN Outcome: Adequate for Discharge 03/23/2020 [phone] by [name], RN Outcome: Progressing   Problem: Health Behavior/Discharge Planning: Goal: Ability to manage health-related needs will improve 03/23/2020 1532 by [name], RN Outcome: Adequate for Discharge 03/23/2020 [phone] by [name], RN Outcome: Progressing   Problem: Clinical Measurements: Goal: Ability to maintain clinical measurements within normal limits will improve Outcome: Adequate for Discharge Goal: Will remain free from infection Outcome: Adequate for Discharge Goal: Diagnostic test results will improve Outcome: Adequate for Discharge Goal: Respiratory complications will improve Outcome: Adequate for Discharge Goal: Cardiovascular complication will be avoided Outcome: Adequate for Discharge   Problem: Activity: Goal: Risk for activity intolerance will decrease Outcome: Adequate for Discharge   Problem: Nutrition: Goal: Adequate nutrition will be maintained 03/23/2020 1532 by [name], RN Outcome: Adequate for Discharge 03/23/2020 [phone] by [name], RN Outcome: Progressing   Problem: Coping: Goal: Level of anxiety will decrease Outcome: Adequate for Discharge   Problem: Elimination: Goal: Will not experience complications related to bowel motility Outcome: Adequate for Discharge Goal: Will not experience complications related to urinary retention Outcome: Adequate for Discharge   Problem: Pain Managment: Goal: General experience of comfort will improve 03/23/2020 1532 by [name], RN Outcome: Adequate for Discharge 03/23/[phone] by [name], RN Outcome: Progressing   Problem: Safety: Goal: Ability to remain free from injury will  improve Outcome: Adequate for Discharge   Problem: Skin Integrity: Goal: Risk for impaired skin integrity will decrease Outcome: Adequate for Discharge   Problem: Education: Goal: Ability to demonstrate management of disease process will improve 03/23/2020 1532 by [name], RN Outcome: Adequate for Discharge 03/23/2020 1478 by [name], RN Outcome: Progressing Goal: Ability to verbalize understanding of medication therapies will improve Outcome: Adequate for Discharge Goal: Individualized Educational Video(s) Outcome: Adequate for Discharge   Problem: Activity: Goal: Capacity to carry out activities will improve Outcome: Adequate for Discharge   Problem: Cardiac: Goal: Ability to achieve and maintain adequate cardiopulmonary perfusion will improve Outcome: Adequate for Discharge

## 2020-03-23 NOTE — Progress Notes (Signed)
Patient provided with new Bipap mask due to previous provided mask being torn around bridge of nose. Patient tolerating new mask well. On auto Bipap 25/10, 21% FIO2.

## 2020-03-23 NOTE — Progress Notes (Signed)
Charles Rangel to be D/C'd Home per MD order.  Discussed prescriptions and follow up appointments with the patient. Prescriptions were e-prescribed medication list explained in detail. Pt verbalized understanding.  Allergies as of 03/23/2020      Reactions   Tylenol [acetaminophen] Anaphylaxis   Dog Epithelium Allergy Skin Test Hives   Apple Hives   Other Hives   RAISINS      Medication List    STOP taking these medications   bumetanide 2 MG tablet Commonly known as: BUMEX   furosemide 80 MG tablet Commonly known as: Lasix   lisinopril 40 MG tablet Commonly known as: ZESTRIL     TAKE these medications   albuterol 108 (90 Base) MCG/ACT inhaler Commonly known as: VENTOLIN HFA Inhale 2 puffs into the lungs every 6 (six) hours as needed for wheezing or shortness of breath.   allopurinol 100 MG tablet Commonly known as: ZYLOPRIM Take 1 tablet (100 mg total) by mouth daily.   aspirin EC 81 MG tablet Take 81 mg daily by mouth.   carvedilol 25 MG tablet Commonly known as: COREG Take 1 tablet (25 mg total) by mouth 2 (two) times daily with a meal.   colchicine 0.6 MG tablet Take 0.5 tablets (0.3 mg total) by mouth every other day.   metFORMIN 500 MG tablet Commonly known as: GLUCOPHAGE Take 1,000 mg by mouth 2 (two) times daily with a meal.   multivitamin with minerals Tabs tablet Take 1 tablet by mouth daily.   sildenafil 20 MG tablet Commonly known as: REVATIO Take 1 tablet (20 mg total) by mouth 3 (three) times daily.   torsemide 20 MG tablet Commonly known as: DEMADEX Take 2 tablets (40 mg total) by mouth 2 (two) times daily.   Vitamin D3 25 MCG (1000 UT) Caps Take 4,000 Units by mouth daily.            Durable Medical Equipment  (From admission, onward)         Start     Ordered   03/21/20 0733  For home use only DME oxygen  Once    Question Answer Comment  Length of Need Lifetime   Mode or (Route) Nasal cannula   Liters per Minute 2   Oxygen  delivery system Gas      03/21/20 0732          Vitals:   03/23/20 1108 03/23/20 1512  BP: 125/74 128/77  Pulse: 93 94  Resp: 19 18  Temp: 98.3 F (36.8 C) 98.3 F (36.8 C)  SpO2: 97% 96%    Tele box removed and returned. Skin clean, dry and intact without evidence of skin break down, no evidence of skin tears noted. IV catheter discontinued intact. Site without signs and symptoms of complications. Dressing and pressure applied. Pt denies pain at this time. No complaints noted.  An After Visit Summary was printed and given to the patient. Patient escorted via WC, and D/C home via private auto.  Charles Rangel

## 2020-03-24 NOTE — Progress Notes (Signed)
Patient ID: Charles Rangel, male    DOB: 08/22/1976, 44 y.o.   MRN: [id number]  HPI  Charles Rangel is a 44 y/o male with a history of Dm, HTN, COPD, OSA, DVT, gout, obesity, previous tobacco use and chronic heart failure.   Echo report from 12/22/19 reviewed and showed an EF of 60-65% along with moderate LVH. Echo report from 11/17/2018 reviewed and showed an EF of 60-65%.  Catheterization done 03/20/20 showed: 1. Moderately elevated left heart filling pressure. 2. Severely elevated right heart and pulmonary artery pressures. 3. Normal Fick cardiac output/index.  Admitted 03/18/20 due to acute on chronic heart failure. Cardiology and pulmonology consults obtained. RHC done. Initially given IV lasix with transition to oral diuretics.  Able to be weaned off oxygen. Negative 12.4L. Discharged after 5 days.   He presents today for a follow-up visit although hasn't been seen since February 2020. He presents with a chief complaint of intermittent headaches. He says that this occur sporadically and he's had these for years. He doesn't have any other symptoms and specifically denies any difficulty sleeping, dizziness, abdominal distention, palpitations, pedal edema, chest pain, shortness of breath, cough, fatigue or weight gain.   He has not taken any of his medications yet but says he will take them once he gets to work in [employer]. 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 Says that he's weighing daily and his weight fluctuates a few ounces a day. Feels like his torsemide is working better than his furosemide was.    Past Medical History:  Diagnosis Date  . (HFpEF) heart failure with preserved ejection fraction (HCC)    a. 02/2018 Echo: EF 65-70%, mildly dil LA.  . CHF (congestive heart failure) (HCC)   . COPD (chronic obstructive pulmonary disease) (HCC)   . Diabetes mellitus without complication (HCC)   . DVT (deep venous thrombosis) (HCC)   . Gout   . Hypertension   . Morbid obesity  (HCC)   . OSA (obstructive sleep apnea)    Past Surgical History:  Procedure Laterality Date  . Left leg surgery for fracture    . RIGHT HEART CATH N/A 03/20/2020   Procedure: RIGHT HEART CATH;  Surgeon: Yvonne Kendall, MD;  Location: [facility] INVASIVE CV LAB;  Service: Cardiovascular;  Laterality: N/A;   Family History  Problem Relation Age of Onset  . Hypertension Mother   . Hypertension Father   . CVA Father 60   Social History   Tobacco Use  . Smoking status: Former Smoker    Packs/day: 1.00    Years: 20.00    Pack years: 20.00    Quit date: 10/20/2010    Years since quitting: 9.4  . Smokeless tobacco: Never Used  Substance Use Topics  . Alcohol use: Not Currently   Allergies  Allergen Reactions  . Tylenol [Acetaminophen] Anaphylaxis  . Dog Epithelium Allergy Skin Test Hives  . Apple Hives  . Other Hives    RAISINS    Prior to Admission medications   Medication Sig Start Date End Date Taking? Authorizing Provider  albuterol (VENTOLIN HFA) 108 (90 Base) MCG/ACT inhaler Inhale 2 puffs into the lungs every 6 (six) hours as needed for wheezing or shortness of breath. 08/27/19  Yes Chesley Noon, MD  allopurinol (ZYLOPRIM) 100 MG tablet Take 1 tablet (100 mg total) by mouth daily. 11/18/18  Yes Lule, Joana, PA  aspirin EC 81 MG tablet Take 81 mg daily by mouth.   Yes  [provider]  carvedilol (COREG) 25 MG tablet Take 1 tablet (25 mg total) by mouth 2 (two) times daily with a meal. 12/25/19 03/26/20 Yes Darlin Priestly, MD  Cholecalciferol (VITAMIN D3) 1000 units CAPS Take 4,000 Units by mouth daily.    Yes [provider]  colchicine 0.6 MG tablet Take 0.5 tablets (0.3 mg total) by mouth every other day. 12/25/19  Yes Darlin Priestly, MD  metFORMIN (GLUCOPHAGE) 500 MG tablet Take 1,000 mg by mouth 2 (two) times daily with a meal.    Yes [provider]  Multiple Vitamin (MULTIVITAMIN WITH MINERALS) TABS tablet Take 1 tablet by mouth daily.   Yes [provider]  sildenafil (REVATIO) 20 MG tablet Take 1 tablet (20 mg total) by mouth 3 (three) times daily. 03/23/20  Yes [name], DO  torsemide (DEMADEX) 20 MG tablet Take 2 tablets (40 mg total) by mouth 2 (two) times daily. 03/23/20  Yes Pennie Banter, DO    Review of Systems  Constitutional: Negative for appetite change and fatigue.  HENT: Negative for congestion, rhinorrhea and sore throat.   Eyes: Negative.   Respiratory: Negative for cough, chest tightness and shortness of breath.   Cardiovascular: Negative for chest pain, palpitations and leg swelling.  Gastrointestinal: Negative for abdominal distention and abdominal pain.  Endocrine: Negative.   Genitourinary: Negative.   Musculoskeletal: Negative for arthralgias and back pain.  Skin: Negative.   Allergic/Immunologic: Negative.   Neurological: Positive for headaches (at times). Negative for dizziness and light-headedness.  Hematological: Negative for adenopathy. Does not bruise/bleed easily.  Psychiatric/Behavioral: Negative for dysphoric mood and sleep disturbance (sleeping well). The patient is not nervous/anxious.    Vitals:   03/26/20 0826  BP: (!) 153/61  Pulse: 95  Resp: 20  SpO2: 92%  Weight: (!) 326 lb 2 oz (147.9 kg)  Height: 5\' 6"  (1.676 m)   Wt Readings from Last 3 Encounters:  03/26/20 (!) 326 lb 2 oz (147.9 kg)  03/23/20 (!) 321 lb 9.6 oz (145.9 kg)  12/25/19 (!) 317 lb 10.9 oz (144.1 kg)   Lab Results  Component Value Date   CREATININE 1.35 (H) 03/23/2020   CREATININE 1.27 (H) 03/22/2020   CREATININE 1.38 (H) 03/21/2020    Physical Exam Vitals and nursing note reviewed.  Constitutional:      Appearance: Normal appearance.  HENT:     Head: Normocephalic and atraumatic.  Cardiovascular:     Rate and Rhythm: Normal rate and regular rhythm.  Pulmonary:     Effort: Pulmonary effort is normal. No respiratory distress.     Breath sounds: No wheezing or rales.  Abdominal:     General:  There is no distension.     Palpations: Abdomen is soft.  Musculoskeletal:        General: No tenderness.     Cervical back: Normal range of motion and neck supple.     Right lower leg: Edema (1+ pitting) present.     Left lower leg: Edema (1+ pitting) present.  Skin:    General: Skin is warm and dry.  Neurological:     General: No focal deficit present.     Mental Status: He is alert and oriented to person, place, and time.  Psychiatric:        Mood and Affect: Mood normal.        Behavior: Behavior normal.    Assessment & Plan:  1: Chronic heart failure with preserved ejection fraction along with structural  changes- - NYHA class I - euvolemic today - weighing daily and he was reminded to call for an overnight weight gain of >2 pounds or a weekly weight gain of >5 pounds - not adding salt  - sees cardiology at the [facility]; instructed him to call and get an appointment scheduled - consider adding entresto although may be difficult as he gets his medications through the [place] - BNP 03/18/20 was 68.1  2: HTN- - BP mildly elevated but he hasn't taken any of his medications yet today but will do so once he gets to work - sees PCP at the [place] & instructed him to call and get an appointment scheduled - BMP 03/23/20 reviewed and showed sodium 141, potassium 3.9, creatinine 1.35 and GFR >60  3: DM- - A1c 03/19/20 was 5.4% - nonfasting glucose in clinic today was 181  4: Pulmonary HTN- - sildenafil begun during recent admission  5: Sleep apnea- - supposed to be wearing CPAP at night but he doesn't like to wear it - encouraged him to wear it nightly  Patient did not bring his medications nor a list. Each medication was verbally reviewed with the patient and he was encouraged to bring the bottles to every visit to confirm accuracy of list.  Return in 2 months or sooner for any questions/problems before then.

## 2020-03-25 LAB — PTH-RELATED PEPTIDE: PTH-related peptide: <2 pmol/L

## 2020-03-26 ENCOUNTER — Other Ambulatory Visit: Payer: Self-pay

## 2020-03-26 ENCOUNTER — Encounter: Payer: Self-pay | Admitting: Family

## 2020-03-26 ENCOUNTER — Ambulatory Visit: Payer: PRIVATE HEALTH INSURANCE | Attending: Family | Admitting: Family

## 2020-03-26 VITALS — BP 153/61 | HR 95 | Resp 20 | Ht 66.0 in | Wt 326.1 lb

## 2020-03-26 DIAGNOSIS — Z9119 Patient's noncompliance with other medical treatment and regimen: Secondary | ICD-10-CM | POA: Insufficient documentation

## 2020-03-26 DIAGNOSIS — I272 Pulmonary hypertension, unspecified: Secondary | ICD-10-CM | POA: Insufficient documentation

## 2020-03-26 DIAGNOSIS — M109 Gout, unspecified: Secondary | ICD-10-CM | POA: Insufficient documentation

## 2020-03-26 DIAGNOSIS — Z7982 Long term (current) use of aspirin: Secondary | ICD-10-CM | POA: Insufficient documentation

## 2020-03-26 DIAGNOSIS — Z87891 Personal history of nicotine dependence: Secondary | ICD-10-CM | POA: Insufficient documentation

## 2020-03-26 DIAGNOSIS — E119 Type 2 diabetes mellitus without complications: Secondary | ICD-10-CM

## 2020-03-26 DIAGNOSIS — G4733 Obstructive sleep apnea (adult) (pediatric): Secondary | ICD-10-CM | POA: Insufficient documentation

## 2020-03-26 DIAGNOSIS — Z8249 Family history of ischemic heart disease and other diseases of the circulatory system: Secondary | ICD-10-CM | POA: Insufficient documentation

## 2020-03-26 DIAGNOSIS — Z886 Allergy status to analgesic agent status: Secondary | ICD-10-CM | POA: Insufficient documentation

## 2020-03-26 DIAGNOSIS — Z79899 Other long term (current) drug therapy: Secondary | ICD-10-CM | POA: Insufficient documentation

## 2020-03-26 DIAGNOSIS — Z7984 Long term (current) use of oral hypoglycemic drugs: Secondary | ICD-10-CM | POA: Insufficient documentation

## 2020-03-26 DIAGNOSIS — I10 Essential (primary) hypertension: Secondary | ICD-10-CM

## 2020-03-26 DIAGNOSIS — Z86718 Personal history of other venous thrombosis and embolism: Secondary | ICD-10-CM | POA: Insufficient documentation

## 2020-03-26 DIAGNOSIS — J449 Chronic obstructive pulmonary disease, unspecified: Secondary | ICD-10-CM | POA: Insufficient documentation

## 2020-03-26 DIAGNOSIS — I5032 Chronic diastolic (congestive) heart failure: Secondary | ICD-10-CM

## 2020-03-26 DIAGNOSIS — I110 Hypertensive heart disease with heart failure: Secondary | ICD-10-CM | POA: Insufficient documentation

## 2020-03-26 LAB — GLUCOSE, CAPILLARY: Glucose-Capillary: 181 mg/dL — ABNORMAL HIGH (ref 70–99)

## 2020-03-26 NOTE — Telephone Encounter (Signed)
Attempted to schedule no ans no vm  

## 2020-03-26 NOTE — Patient Instructions (Addendum)
Continue weighing daily and call for an overnight weight gain of > 2 pounds or a weekly weight gain of >5 pounds.   Call the [facility] and get appointments schedule with your primary care doctor and heart doctor

## 2020-03-27 ENCOUNTER — Telehealth: Payer: Self-pay

## 2020-03-27 DIAGNOSIS — I5033 Acute on chronic diastolic (congestive) heart failure: Secondary | ICD-10-CM

## 2020-03-27 NOTE — Telephone Encounter (Signed)
LMOM to schedule follow-up appointment 

## 2020-03-27 NOTE — Telephone Encounter (Addendum)
TCM- 1st attempt. lmtcb. [facility]

## 2020-03-27 NOTE — Telephone Encounter (Signed)
Spoke with patient   [facility]....  Patient is being discharged      They are scheduled to see Luther Parody on 6/21 at 11 am    They need to be seen within 1-2 weeks      Please call

## 2020-03-27 NOTE — Telephone Encounter (Signed)
-----   Message from Iran Ouch, MD sent at 03/23/2020 11:34 AM EDT ----- Possible discharge home from [facility] today. TCM follow-up with APP needed in 1 to 2 weeks with basic metabolic profile recommended in 1 week.

## 2020-03-28 NOTE — Telephone Encounter (Signed)
Duplicate encounter.  See 03/27/20 telephone encounter. Patient is scheduled on 04/09/20 with Gillian Shields, NP.

## 2020-03-29 NOTE — Telephone Encounter (Signed)
TCM- 3rd attempt to contact the patient. Lmtcb.  Closing this encounter.

## 2020-03-29 NOTE — Telephone Encounter (Signed)
TCM- 2nd attempt. lmtcb.

## 2020-04-02 ENCOUNTER — Other Ambulatory Visit
Admission: RE | Admit: 2020-04-02 | Discharge: 2020-04-02 | Disposition: A | Discharge status: Home / Self Care | Payer: PRIVATE HEALTH INSURANCE | Attending: Cardiovascular Disease | Admitting: Cardiovascular Disease

## 2020-04-02 DIAGNOSIS — I5033 Acute on chronic diastolic (congestive) heart failure: Secondary | ICD-10-CM | POA: Insufficient documentation

## 2020-04-02 LAB — BASIC METABOLIC PANEL
Anion gap: 11 (ref 5–15)
BUN: 19 mg/dL (ref 6–20)
CO2: 35 mmol/L — ABNORMAL HIGH (ref 22–32)
Calcium: 10.2 mg/dL (ref 8.9–10.3)
Chloride: 94 mmol/L — ABNORMAL LOW (ref 98–111)
Creatinine, Ser: 1.29 mg/dL — ABNORMAL HIGH (ref 0.61–1.24)
GFR calc Af Amer: >60 mL/min (ref >60)
GFR calc non Af Amer: >60 mL/min (ref >60)
Glucose, Bld: 120 mg/dL — ABNORMAL HIGH (ref 70–99)
Potassium: 3.8 mmol/L (ref 3.5–5.1)
Sodium: 140 mmol/L (ref 135–145)

## 2020-04-09 ENCOUNTER — Ambulatory Visit (INDEPENDENT_AMBULATORY_CARE_PROVIDER_SITE_OTHER): Payer: No Typology Code available for payment source | Admitting: Family

## 2020-04-09 ENCOUNTER — Other Ambulatory Visit: Payer: Self-pay

## 2020-04-09 ENCOUNTER — Encounter: Payer: Self-pay | Admitting: Family

## 2020-04-09 VITALS — BP 120/70 | HR 82 | Ht 66.0 in | Wt 324.0 lb

## 2020-04-09 DIAGNOSIS — K219 Gastro-esophageal reflux disease without esophagitis: Secondary | ICD-10-CM

## 2020-04-09 DIAGNOSIS — I10 Essential (primary) hypertension: Secondary | ICD-10-CM

## 2020-04-09 DIAGNOSIS — I272 Pulmonary hypertension, unspecified: Secondary | ICD-10-CM

## 2020-04-09 DIAGNOSIS — I5032 Chronic diastolic (congestive) heart failure: Secondary | ICD-10-CM

## 2020-04-09 DIAGNOSIS — G4733 Obstructive sleep apnea (adult) (pediatric): Secondary | ICD-10-CM | POA: Diagnosis not present

## 2020-04-09 MED ORDER — OMEPRAZOLE 20 MG PO CPDR: 20.0000 mg | DELAYED_RELEASE_CAPSULE | Freq: Every day | ORAL | 1 refills | Status: DC

## 2020-04-09 MED ORDER — SILDENAFIL CITRATE 20 MG PO TABS: 20.0000 mg | ORAL_TABLET | Freq: Three times a day (TID) | ORAL | 0 refills | Status: DC

## 2020-04-09 MED ORDER — SPIRONOLACTONE 25 MG PO TABS
12.5000 mg | ORAL_TABLET | Freq: Every day | ORAL | 1 refills | Status: DC
Start: 2020-04-09 — End: 2020-05-09

## 2020-04-09 MED ORDER — TORSEMIDE 20 MG PO TABS: 40.0000 mg | ORAL_TABLET | Freq: Two times a day (BID) | ORAL | 1 refills | Status: DC

## 2020-04-09 NOTE — Progress Notes (Signed)
Office Visit    Patient Name: Charles Rangel Date of Encounter: 04/09/2020  Primary Care Provider:  [facility] Primary Cardiologist:  Lorine Bears, MD Electrophysiologist:  None   Chief Complaint    Charles Rangel is a 44 y.o. male with a hx of HFpEF, COPD, DM 2, remote DVT of RLE in setting of prolonged travel, remote tobacco use, HTN, obesity, OSA with intermittent adherence of CPAP, gout presents today for follow-up after hospitalization  Past Medical History    Past Medical History:  Diagnosis Date  . (HFpEF) heart failure with preserved ejection fraction (HCC)    a. 02/2018 Echo: EF 65-70%, mildly dil LA.  . CHF (congestive heart failure) (HCC)   . COPD (chronic obstructive pulmonary disease) (HCC)   . Diabetes mellitus without complication (HCC)   . DVT (deep venous thrombosis) (HCC)   . Gout   . Hypertension   . Morbid obesity (HCC)   . OSA (obstructive sleep apnea)    Past Surgical History:  Procedure Laterality Date  . Left leg surgery for fracture    . RIGHT HEART CATH N/A 03/20/2020   Procedure: RIGHT HEART CATH;  Surgeon: Yvonne Kendall, MD;  Location: [facility] INVASIVE CV LAB;  Service: Cardiovascular;  Laterality: N/A;    Allergies  Allergies  Allergen Reactions  . Tylenol [Acetaminophen] Anaphylaxis  . Dog Epithelium Allergy Skin Test Hives  . Apple Hives  . Other Hives    RAISINS    History of Present Illness    Charles Rangel is a 44 y.o. male with a hx of  HFpEF, COPD, DM 2, remote DVT of RLE in setting of prolonged travel, remote tobacco use, HTN, obesity, OSA with intermittent adherence of CPAP, gout last seen while hospitalized.  Admitted to the hospital 02/2017 with acute on chronic respiratory failure in the setting of COPD exacerbation and mild CHF.  Echo with normal LVEF.  Chronically he is followed at the [facility].  Admitted 06/2018 with recurrent heart failure exacerbation and gout.  Readmitted to [facility]  1/25 with acute on chronic diastolic CHF in the setting of dietary instruction and oriented medications.  Echo during that admission with LVEF 60 to 65%, mild LVH, diastolic dysfunction, no significant valvular abnormalities.  Seen in the ED 08/2019 with pleuritic chest pain.  CTA chest negative for PE.  Suspicion for possible COVID-19 infection although still follow-up with negative.  High-sensitivity troponins were negative x2.  EKG showed SR with no acute ST/T wave changes.  He was advised to follow-up as outpatient.  Admitted 12/2018 with acute on chronic HFpEF and severe pulmonary hypertension requiring IV diuresis and symptom improvement.  CTA of chest negative for PE with markedly dilated main pulmonary artery suggesting severe chronic pulmonary arterial hypertension.  Echo LVEF 60 to 65%, no regional wall motion abnormalities, moderate LVH, indeterminate LV diastolic function, RV normal size and function, mildly dilated LA.  Outpatient follow-up with his cardiologist in the [place] was recommended but not completed.  Presented to [facility] 03/18/2020 with 1 week history of increased DOE.  He had been taking Lasix 80 mg daily 100 doubled without improvement in his symptoms.  CXR with cardiomegaly with vascular congestion and apparent degree of pulmonary arterial hypertension with no frank edema or airspace consolidation.  Pulmonology and cardiology were consulted with recommendation for empiric sildenafil and RHC.  RHC 03/20/2020 with moderately elevated left heart filling pressures, severely elevated right heart and pulmonary artery pressures, normal Fick cardiac output/index.  Recommended to escalate diuresis and for continue management of underlying lung disease per primary and pulmonology.  His Lasix was transitioned to torsemide on discharge.  His sildenafil was continued on discharge. -12.4L for admission.   Reports feeling overall well since hospitalization.  His wife joined [place] via speaker phone for questions  during the visit.  She request review of his medications we reviewed what each medication was for.  He endorses dyspnea on exertion with stairs after about 7 steps.  Reports no shortness of breath at rest.  This is overall improving since hospital discharge.  He reports intermittent orthopnea but was able to lay flat in the bed yesterday without difficulty.  He sleeps in the recliner most of the time.  He notices difficulty breathing in bed associated with indigestion.  He has never been on PPI.  He does endorse eating significant amount of fried food and acidic food.  Weighs daily at home with weight overall stable since hospital discharge.  Based on discharge weight and her weight in clinic today he is up 2.42 pounds since hospital discharge.  Drinks a large jug of water at work but is unclear of the amount and we discussed recommendation for less than 2 L of fluid.  He does not add salt to his food but does eat out some and we discussed that this could lead to increase sodium intake.  EKGs/Labs/Other Studies Reviewed:   The following studies were reviewed today:  [place] 03/20/2020: Conclusions: 1. Moderately elevated left heart filling pressure. 2. Severely elevated right heart and pulmonary artery pressures. 3. Normal Fick cardiac output/index.   Recommendations: 1. Escalate diuresis. 2. Ongoing management of underlying lung disease (including OSA) per primary and pulmonary teams. __________   2D echo 12/2019: 1. Left ventricular ejection fraction, by estimation, is 60 to 65%. The  left ventricle has normal function. The left ventricle has no regional  wall motion abnormalities. There is moderate left ventricular hypertrophy.  Left ventricular diastolic  parameters are indeterminate.   2. Right ventricular systolic function is normal. The right ventricular  size is normal.   3. Left atrial size was mildly dilated.   EKG:  EKG is  ordered today.  The ekg ordered today demonstrates sinus  rhythm 83 bpm with no acute ST/T wave changes  Recent Labs: 12/22/2019: ALT 27 03/18/2020: B Natriuretic Peptide 68.1 03/20/2020: TSH 2.305 03/22/2020: Hemoglobin 15.2; Magnesium 2.3; Platelets 299 04/02/2020: BUN 19; Creatinine, Ser 1.29; Potassium 3.8; Sodium 140  Recent Lipid Panel    Component Value Date/Time   CHOL 149 05/26/2013 0411   TRIG 53 05/26/2013 0411   HDL 51 05/26/2013 0411   VLDL 11 05/26/2013 0411   LDLCALC 87 05/26/2013 0411    Home Medications   Current Meds  Medication Sig  . albuterol (VENTOLIN HFA) 108 (90 Base) MCG/ACT inhaler Inhale 2 puffs into the lungs every 6 (six) hours as needed for wheezing or shortness of breath.  . allopurinol (ZYLOPRIM) 100 MG tablet Take 1 tablet (100 mg total) by mouth daily.  Marland Kitchen aspirin EC 81 MG tablet Take 81 mg daily by mouth.  . carvedilol (COREG) 25 MG tablet Take 1 tablet (25 mg total) by mouth 2 (two) times daily with a meal.  . Cholecalciferol (VITAMIN D3) 1000 units CAPS Take 4,000 Units by mouth daily.   . colchicine 0.6 MG tablet Take 0.5 tablets (0.3 mg total) by mouth every other day.  . metFORMIN (GLUCOPHAGE) 500 MG tablet Take 1,000 mg by mouth  2 (two) times daily with a meal.   . Multiple Vitamin (MULTIVITAMIN WITH MINERALS) TABS tablet Take 1 tablet by mouth daily.  . sildenafil (REVATIO) 20 MG tablet Take 1 tablet (20 mg total) by mouth 3 (three) times daily.  Marland Kitchen torsemide (DEMADEX) 20 MG tablet Take 2 tablets (40 mg total) by mouth 2 (two) times daily.      Review of Systems       Review of Systems  Constitutional: Negative for chills, fever and malaise/fatigue.  Cardiovascular: Positive for dyspnea on exertion, leg swelling and orthopnea. Negative for chest pain, near-syncope, palpitations and syncope.  Respiratory: Negative for cough, shortness of breath and wheezing.   Gastrointestinal: Positive for heartburn. Negative for nausea and vomiting.  Neurological: Negative for dizziness, light-headedness and  weakness.   All other systems reviewed and are otherwise negative except as noted above.  Physical Exam    VS:  BP 120/70 (BP Location: Left Arm, Patient Position: Sitting, Cuff Size: Large)   Pulse 82   Ht 5\' 6"  (1.676 m)   Wt (!) 324 lb (147 kg)   SpO2 95%   BMI 52.29 kg/m  , BMI Body mass index is 52.29 kg/m. GEN: Well nourished, obese, well developed, in no acute distress. HEENT: normal. Neck: Supple, no carotid bruits, or masses. Cardiac: RRR, no murmurs, rubs, or gallops. No clubbing, cyanosis.  Trace edema bilateral lower extremity.  Radials//PT 2+ and equal bilaterally.  Respiratory:  Respirations regular and unlabored, clear to auscultation bilaterally.  Mildly diminished breath sounds on the bilateral bases likely due to body habitus. GI: Soft, nontender, nondistended, MS: No deformity or atrophy. Skin: Warm and dry, no rash.  Bilateral lower extremities changes consistent with chronic woody edema Neuro:  Strength and sensation are intact. Psych: Normal affect.   Assessment & Plan    1. Acute on chronic HFpEF/severe pulmonary hypertension -Recent admission for HFpEF and pulmonary hypertension.  Up 2.42 pound since hospital discharge.  Reports improved DOE and LE edema.  Continue sildenafil 3 times daily.  Continue torsemide 40 mg twice daily.  Refills provided. educated that he may take his second dose in the afternoon rather than at bedtime to prevent nocturia.  Bilateral lower extremity with trace edema and chronic woody appearance.  Start spironolactone 12.5 mg daily.  BMP in 2 weeks Long discussion regarding fluid restriction and low-sodium diet.  Cardiac rehab offered but politely declined.  Encourage regular cardiovascular activity such as walking  2. CKDII -renal function post hospital discharge stable.  Careful titration of diuretics and antihypertensives.  3. GERD -start omeprazole 20 mg daily.  Encouraged to avoid fried, acidic, spicy foods.  4. HTN - BP well  controlled.   5. Morbid obesity with OSA and likely OHS -CPAP compliance encouraged.  Weight loss encouraged.  Disposition: Follow up in 2 month(s) with heart failure clinic in in 4 months with Dr. or APP.  [facility]  Kirke Corin, NP 04/09/2020, 11:08 AM

## 2020-04-09 NOTE — Patient Instructions (Addendum)
Medication Instructions:  Your physician has recommended you make the following change in your medication:   START Omeprazole (Prilosec)   START Spironolactone (Aldactone) 12.5mg  (half tablet) daily to help with continued ankle swelling  CONTINUE Torsemide 40mg  (two tablets) twice daily *you may take your second dose in the afternoon instead of at bedtime*  *If you need a refill on your cardiac medications before your next appointment, please call your pharmacy*  Lab Work: Your physician recommends that you return for lab work in: 2 weeks at the for [facility]  Testing/Procedures: Your EKG today shows normal sinus rhythm which is a normal heart rhythm.   Follow-Up: At [facility], you and your health needs are our priority.  As part of our continuing mission to provide you with exceptional heart care, we have created designated Provider Care Teams.  These Care Teams include your primary Cardiologist (physician) and Advanced Practice Providers (APPs -  Physician Assistants and Nurse Practitioners) who all work together to provide you with the care you need, when you need it.  We recommend signing up for the patient portal called "MyChart".  Sign up information is provided on this After Visit Summary.  MyChart is used to connect with patients for Virtual Visits (Telemedicine).  Patients are able to view lab/test results, encounter notes, upcoming appointments, etc.  Non-urgent messages can be sent to your provider as well.   To learn more about what you can do with MyChart, go to [facility] [place] [facility].    Your next appointment:   In August with September, NP In 4 months with Dr. Clarisa Kindred or APP  Other Instructions N/A

## 2020-04-17 ENCOUNTER — Other Ambulatory Visit: Payer: Self-pay | Admitting: Family

## 2020-04-17 DIAGNOSIS — I5032 Chronic diastolic (congestive) heart failure: Secondary | ICD-10-CM

## 2020-05-02 ENCOUNTER — Other Ambulatory Visit: Payer: Self-pay | Admitting: Family

## 2020-05-02 DIAGNOSIS — I272 Pulmonary hypertension, unspecified: Secondary | ICD-10-CM

## 2020-05-05 ENCOUNTER — Emergency Department: Payer: No Typology Code available for payment source

## 2020-05-05 ENCOUNTER — Observation Stay (HOSPITAL_BASED_OUTPATIENT_CLINIC_OR_DEPARTMENT_OTHER)
Admit: 2020-05-05 | Discharge: 2020-05-05 | Disposition: A | Discharge status: Home / Self Care | Payer: No Typology Code available for payment source | Attending: Internal Medicine | Admitting: Internal Medicine

## 2020-05-05 ENCOUNTER — Inpatient Hospital Stay
Admission: EM | Admit: 2020-05-05 | Discharge: 2020-05-09 | DRG: 291 | Disposition: A | Discharge status: Other Acute Inpatient Hospital | Payer: No Typology Code available for payment source | Attending: Internal Medicine | Admitting: Internal Medicine

## 2020-05-05 ENCOUNTER — Other Ambulatory Visit: Payer: Self-pay

## 2020-05-05 DIAGNOSIS — Z79899 Other long term (current) drug therapy: Secondary | ICD-10-CM

## 2020-05-05 DIAGNOSIS — I130 Hypertensive heart and chronic kidney disease with heart failure and stage 1 through stage 4 chronic kidney disease, or unspecified chronic kidney disease: Secondary | ICD-10-CM | POA: Diagnosis not present

## 2020-05-05 DIAGNOSIS — E876 Hypokalemia: Secondary | ICD-10-CM

## 2020-05-05 DIAGNOSIS — I5033 Acute on chronic diastolic (congestive) heart failure: Secondary | ICD-10-CM | POA: Insufficient documentation

## 2020-05-05 DIAGNOSIS — Z01818 Encounter for other preprocedural examination: Secondary | ICD-10-CM

## 2020-05-05 DIAGNOSIS — I10 Essential (primary) hypertension: Secondary | ICD-10-CM | POA: Diagnosis not present

## 2020-05-05 DIAGNOSIS — J449 Chronic obstructive pulmonary disease, unspecified: Secondary | ICD-10-CM | POA: Diagnosis present

## 2020-05-05 DIAGNOSIS — K219 Gastro-esophageal reflux disease without esophagitis: Secondary | ICD-10-CM

## 2020-05-05 DIAGNOSIS — E1122 Type 2 diabetes mellitus with diabetic chronic kidney disease: Secondary | ICD-10-CM | POA: Diagnosis not present

## 2020-05-05 DIAGNOSIS — R0902 Hypoxemia: Secondary | ICD-10-CM

## 2020-05-05 DIAGNOSIS — J690 Pneumonitis due to inhalation of food and vomit: Secondary | ICD-10-CM | POA: Diagnosis present

## 2020-05-05 DIAGNOSIS — I2729 Other secondary pulmonary hypertension: Secondary | ICD-10-CM | POA: Diagnosis present

## 2020-05-05 DIAGNOSIS — N179 Acute kidney failure, unspecified: Secondary | ICD-10-CM | POA: Diagnosis present

## 2020-05-05 DIAGNOSIS — Z7984 Long term (current) use of oral hypoglycemic drugs: Secondary | ICD-10-CM

## 2020-05-05 DIAGNOSIS — I2781 Cor pulmonale (chronic): Secondary | ICD-10-CM | POA: Diagnosis present

## 2020-05-05 DIAGNOSIS — G4733 Obstructive sleep apnea (adult) (pediatric): Secondary | ICD-10-CM | POA: Diagnosis present

## 2020-05-05 DIAGNOSIS — Z20822 Contact with and (suspected) exposure to covid-19: Secondary | ICD-10-CM | POA: Diagnosis present

## 2020-05-05 DIAGNOSIS — Z86718 Personal history of other venous thrombosis and embolism: Secondary | ICD-10-CM

## 2020-05-05 DIAGNOSIS — J9621 Acute and chronic respiratory failure with hypoxia: Secondary | ICD-10-CM | POA: Diagnosis present

## 2020-05-05 DIAGNOSIS — M109 Gout, unspecified: Secondary | ICD-10-CM | POA: Diagnosis present

## 2020-05-05 DIAGNOSIS — Z87891 Personal history of nicotine dependence: Secondary | ICD-10-CM

## 2020-05-05 DIAGNOSIS — G9341 Metabolic encephalopathy: Secondary | ICD-10-CM | POA: Diagnosis present

## 2020-05-05 DIAGNOSIS — M79605 Pain in left leg: Secondary | ICD-10-CM

## 2020-05-05 DIAGNOSIS — J9622 Acute and chronic respiratory failure with hypercapnia: Secondary | ICD-10-CM | POA: Diagnosis present

## 2020-05-05 DIAGNOSIS — Z6841 Body Mass Index (BMI) 40.0 and over, adult: Secondary | ICD-10-CM

## 2020-05-05 DIAGNOSIS — R579 Shock, unspecified: Secondary | ICD-10-CM | POA: Diagnosis present

## 2020-05-05 DIAGNOSIS — N182 Chronic kidney disease, stage 2 (mild): Secondary | ICD-10-CM | POA: Diagnosis present

## 2020-05-05 DIAGNOSIS — I272 Pulmonary hypertension, unspecified: Secondary | ICD-10-CM

## 2020-05-05 DIAGNOSIS — Z7982 Long term (current) use of aspirin: Secondary | ICD-10-CM

## 2020-05-05 DIAGNOSIS — I509 Heart failure, unspecified: Secondary | ICD-10-CM

## 2020-05-05 LAB — CBC
HCT: 44 % (ref 39.0–52.0)
Hemoglobin: 13.2 g/dL (ref 13.0–17.0)
MCH: 23.9 pg — ABNORMAL LOW (ref 26.0–34.0)
MCHC: 30 g/dL (ref 30.0–36.0)
MCV: 79.7 fL — ABNORMAL LOW (ref 80.0–100.0)
Platelets: 284 10*3/uL (ref 150–400)
RBC: 5.52 MIL/uL (ref 4.22–5.81)
RDW: 17.9 % — ABNORMAL HIGH (ref 11.5–15.5)
WBC: 8.6 10*3/uL (ref 4.0–10.5)
nRBC: 0 % (ref 0.0–0.2)

## 2020-05-05 LAB — ECHOCARDIOGRAM COMPLETE
AR max vel: 3.13 cm2
AV Area VTI: 2.97 cm2
AV Area mean vel: 2.91 cm2
AV Mean grad: 5 mmHg
AV Peak grad: 8.2 mmHg
Ao pk vel: 1.44 m/s
Area-P 1/2: 3.3 cm2
Height: 66 in
S' Lateral: 2.4 cm
Weight: 5232 oz

## 2020-05-05 LAB — BASIC METABOLIC PANEL
Anion gap: 14 (ref 5–15)
BUN: 19 mg/dL (ref 6–20)
CO2: 32 mmol/L (ref 22–32)
Calcium: 9.7 mg/dL (ref 8.9–10.3)
Chloride: 92 mmol/L — ABNORMAL LOW (ref 98–111)
Creatinine, Ser: 1.53 mg/dL — ABNORMAL HIGH (ref 0.61–1.24)
GFR calc Af Amer: >60 mL/min (ref >60)
GFR calc non Af Amer: 54 mL/min — ABNORMAL LOW (ref >60)
Glucose, Bld: 129 mg/dL — ABNORMAL HIGH (ref 70–99)
Potassium: 3.3 mmol/L — ABNORMAL LOW (ref 3.5–5.1)
Sodium: 138 mmol/L (ref 135–145)

## 2020-05-05 LAB — GLUCOSE, CAPILLARY
Glucose-Capillary: 121 mg/dL — ABNORMAL HIGH (ref 70–99)
Glucose-Capillary: 166 mg/dL — ABNORMAL HIGH (ref 70–99)
Glucose-Capillary: 172 mg/dL — ABNORMAL HIGH (ref 70–99)

## 2020-05-05 LAB — BRAIN NATRIURETIC PEPTIDE: B Natriuretic Peptide: 46 pg/mL (ref 0.0–100.0)

## 2020-05-05 LAB — TROPONIN I (HIGH SENSITIVITY)
Troponin I (High Sensitivity): 6 ng/L (ref <18)
Troponin I (High Sensitivity): 6 ng/L (ref <18)

## 2020-05-05 LAB — SARS CORONAVIRUS 2 BY RT PCR (HOSPITAL ORDER, PERFORMED IN ~~LOC~~ HOSPITAL LAB): SARS Coronavirus 2: NEGATIVE

## 2020-05-05 MED ORDER — POTASSIUM CHLORIDE CRYS ER 20 MEQ PO TBCR
40.0000 meq | EXTENDED_RELEASE_TABLET | ORAL | Status: AC
  Administered 2020-05-05 (×2): 40 meq via ORAL
  Filled 2020-05-05 (×2): qty 2

## 2020-05-05 MED ORDER — HYDRALAZINE HCL 20 MG/ML IJ SOLN: 5.0000 mg | INTRAMUSCULAR | Status: DC | PRN

## 2020-05-05 MED ORDER — VITAMIN D 25 MCG (1000 UNIT) PO TABS
4000.0000 [IU] | ORAL_TABLET | Freq: Every day | ORAL | Status: DC
  Administered 2020-05-06 – 2020-05-08 (×3): 4000 [IU] via ORAL
  Filled 2020-05-05 (×5): qty 4

## 2020-05-05 MED ORDER — CARVEDILOL 12.5 MG PO TABS
25.0000 mg | ORAL_TABLET | Freq: Two times a day (BID) | ORAL | Status: DC
  Administered 2020-05-05 – 2020-05-07 (×4): 25 mg via ORAL
  Filled 2020-05-05 (×5): qty 1

## 2020-05-05 MED ORDER — SPIRONOLACTONE 25 MG PO TABS
12.5000 mg | ORAL_TABLET | Freq: Every day | ORAL | Status: DC
  Administered 2020-05-05 – 2020-05-06 (×2): 12.5 mg via ORAL
  Filled 2020-05-05 (×2): qty 1
  Filled 2020-05-05 (×2): qty 0.5

## 2020-05-05 MED ORDER — SODIUM CHLORIDE 0.9% FLUSH
3.0000 mL | Freq: Two times a day (BID) | INTRAVENOUS | Status: DC
  Administered 2020-05-05 – 2020-05-08 (×7): 3 mL via INTRAVENOUS

## 2020-05-05 MED ORDER — PANTOPRAZOLE SODIUM 40 MG PO TBEC
40.0000 mg | DELAYED_RELEASE_TABLET | Freq: Every day | ORAL | Status: DC
  Administered 2020-05-05 – 2020-05-08 (×4): 40 mg via ORAL
  Filled 2020-05-05 (×4): qty 1

## 2020-05-05 MED ORDER — PERFLUTREN LIPID MICROSPHERE
1.0000 mL | INTRAVENOUS | Status: AC | PRN
  Administered 2020-05-05: 5 mL via INTRAVENOUS
  Filled 2020-05-05: qty 10

## 2020-05-05 MED ORDER — DM-GUAIFENESIN ER 30-600 MG PO TB12: 1.0000 | ORAL_TABLET | Freq: Two times a day (BID) | ORAL | Status: DC | PRN

## 2020-05-05 MED ORDER — COLCHICINE 0.6 MG PO TABS
0.3000 mg | ORAL_TABLET | ORAL | Status: DC
  Administered 2020-05-06 – 2020-05-08 (×2): 0.3 mg via ORAL
  Filled 2020-05-05 (×2): qty 1
  Filled 2020-05-05 (×2): qty 0.5

## 2020-05-05 MED ORDER — ALBUTEROL SULFATE (2.5 MG/3ML) 0.083% IN NEBU: 2.5000 mg | INHALATION_SOLUTION | RESPIRATORY_TRACT | Status: DC | PRN

## 2020-05-05 MED ORDER — ONDANSETRON HCL 4 MG/2ML IJ SOLN
4.0000 mg | Freq: Three times a day (TID) | INTRAMUSCULAR | Status: DC | PRN
  Administered 2020-05-06: 4 mg via INTRAVENOUS
  Filled 2020-05-05: qty 2

## 2020-05-05 MED ORDER — SILDENAFIL CITRATE 20 MG PO TABS
20.0000 mg | ORAL_TABLET | Freq: Three times a day (TID) | ORAL | Status: DC
  Administered 2020-05-05 – 2020-05-07 (×6): 20 mg via ORAL
  Filled 2020-05-05 (×8): qty 1

## 2020-05-05 MED ORDER — ADULT MULTIVITAMIN W/MINERALS CH
1.0000 | ORAL_TABLET | Freq: Every day | ORAL | Status: DC
  Administered 2020-05-06 – 2020-05-08 (×3): 1 via ORAL
  Filled 2020-05-05 (×4): qty 1

## 2020-05-05 MED ORDER — ALLOPURINOL 100 MG PO TABS
100.0000 mg | ORAL_TABLET | Freq: Every day | ORAL | Status: DC
  Administered 2020-05-05 – 2020-05-08 (×4): 100 mg via ORAL
  Filled 2020-05-05 (×5): qty 1

## 2020-05-05 MED ORDER — INSULIN ASPART 100 UNIT/ML ~~LOC~~ SOLN
0.0000 [IU] | SUBCUTANEOUS | Status: DC
  Administered 2020-05-05: 1 [IU] via SUBCUTANEOUS
  Administered 2020-05-05 (×2): 2 [IU] via SUBCUTANEOUS
  Administered 2020-05-06: 1 [IU] via SUBCUTANEOUS
  Administered 2020-05-06: 5 [IU] via SUBCUTANEOUS
  Administered 2020-05-06 (×2): 2 [IU] via SUBCUTANEOUS
  Administered 2020-05-07 (×6): 1 [IU] via SUBCUTANEOUS
  Administered 2020-05-08: 2 [IU] via SUBCUTANEOUS
  Administered 2020-05-08: 1 [IU] via SUBCUTANEOUS
  Administered 2020-05-08: 5 [IU] via SUBCUTANEOUS
  Administered 2020-05-08 (×2): 1 [IU] via SUBCUTANEOUS
  Filled 2020-05-05 (×17): qty 1

## 2020-05-05 MED ORDER — SODIUM CHLORIDE 0.9 % IV SOLN
250.0000 mL | INTRAVENOUS | Status: DC | PRN
  Administered 2020-05-07: 250 mL via INTRAVENOUS

## 2020-05-05 MED ORDER — IPRATROPIUM-ALBUTEROL 0.5-2.5 (3) MG/3ML IN SOLN
3.0000 mL | RESPIRATORY_TRACT | Status: DC
  Administered 2020-05-05 – 2020-05-08 (×19): 3 mL via RESPIRATORY_TRACT
  Filled 2020-05-05 (×19): qty 3

## 2020-05-05 MED ORDER — FUROSEMIDE 10 MG/ML IJ SOLN
60.0000 mg | Freq: Two times a day (BID) | INTRAMUSCULAR | Status: DC
  Administered 2020-05-05 – 2020-05-06 (×2): 60 mg via INTRAVENOUS
  Filled 2020-05-05 (×2): qty 6

## 2020-05-05 MED ORDER — ENOXAPARIN SODIUM 40 MG/0.4ML ~~LOC~~ SOLN
40.0000 mg | Freq: Two times a day (BID) | SUBCUTANEOUS | Status: DC
  Administered 2020-05-05 – 2020-05-08 (×8): 40 mg via SUBCUTANEOUS
  Filled 2020-05-05 (×8): qty 0.4

## 2020-05-05 MED ORDER — ASPIRIN EC 81 MG PO TBEC
81.0000 mg | DELAYED_RELEASE_TABLET | Freq: Every day | ORAL | Status: DC
  Administered 2020-05-05 – 2020-05-08 (×4): 81 mg via ORAL
  Filled 2020-05-05 (×4): qty 1

## 2020-05-05 MED ORDER — SODIUM CHLORIDE 0.9% FLUSH: 3.0000 mL | INTRAVENOUS | Status: DC | PRN

## 2020-05-05 MED ORDER — FUROSEMIDE 10 MG/ML IJ SOLN
60.0000 mg | Freq: Once | INTRAMUSCULAR | Status: AC
  Administered 2020-05-05: 60 mg via INTRAVENOUS
  Filled 2020-05-05: qty 8

## 2020-05-05 NOTE — ED Notes (Signed)
Pt provided w/ breakfast at bedside.

## 2020-05-05 NOTE — ED Notes (Signed)
Report to mitch, rn.  

## 2020-05-05 NOTE — ED Notes (Signed)
Pt eating lunch in bed at this time. Then trx to floor.

## 2020-05-05 NOTE — ED Notes (Signed)
Attempted to obtain US IV, was not able to obtain IV. Consult to IV team placed at this time.

## 2020-05-05 NOTE — ED Notes (Signed)
Echo at bedside at this time

## 2020-05-05 NOTE — Progress Notes (Signed)
   05/05/20 1400  ReDS Vest / Clip  BMI (Calculated) 53.63  Anatomical Comments NA due to BMI

## 2020-05-05 NOTE — ED Triage Notes (Addendum)
Pt to the er for sob x 1 week r/t copd. Pt reports it has not improved so he came in tonight. Pt sats normally between 94 and 96%. Sats tonight are 87% on room air. Pt has a hx of CHF and is currently on lasix. Pt states his left leg has been swelling. Pt has some mild weeping to the left lower leg.

## 2020-05-05 NOTE — ED Provider Notes (Signed)
[facility] Emergency Department Provider Note  Time seen: 7:32 AM  I have reviewed the triage vital signs and the nursing notes.   HISTORY  Chief Complaint Shortness of Breath   HPI Charles Rangel is a 44 y.o. male with a past medical history of CHF, COPD, hypertension, presents to the emergency department for shortness of breath.  According to the patient over the past 1 week he has been experiencing progressive worsening shortness of breath along with approximately 10 to 20 pound weight gain.  Patient states his legs have began to weep fluid.  Patient denies any chest pain, no fever does state occasional dry cough.  Patient denies oxygen use at home, satting 87% on room air upon arrival.   Past Medical History:  Diagnosis Date  . (HFpEF) heart failure with preserved ejection fraction (HCC)    a. 02/2018 Echo: EF 65-70%, mildly dil LA.  . CHF (congestive heart failure) (HCC)   . COPD (chronic obstructive pulmonary disease) (HCC)   . Diabetes mellitus without complication (HCC)   . DVT (deep venous thrombosis) (HCC)   . Gout   . Hypertension   . Morbid obesity (HCC)   . OSA (obstructive sleep apnea)     Patient Active Problem List   Diagnosis Date Noted  . Acute on chronic respiratory failure with hypoxia and hypercapnia (HCC)   . COPD with acute exacerbation (HCC)   . Pulmonary hypertension, unspecified (HCC)   . CKD stage 2 due to type 2 diabetes mellitus (HCC)   . Morbid obesity with BMI of 50.0-59.9, adult (HCC) 12/22/2019  . HTN (hypertension) 11/23/2018  . DM (diabetes mellitus) (HCC) 11/23/2018  . Gout 11/16/2018  . CHF (congestive heart failure) (HCC) 11/14/2018  . Acute on chronic heart failure with preserved ejection fraction (HFpEF) (HCC) 06/28/2018    Past Surgical History:  Procedure Laterality Date  . Left leg surgery for fracture    . RIGHT HEART CATH N/A 03/20/2020   Procedure: RIGHT HEART CATH;  Surgeon: Yvonne Kendall, MD;   Location: [facility] INVASIVE CV LAB;  Service: Cardiovascular;  Laterality: N/A;    Prior to Admission medications   Medication Sig Start Date End Date Taking? Authorizing Provider  albuterol (VENTOLIN HFA) 108 (90 Base) MCG/ACT inhaler Inhale 2 puffs into the lungs every 6 (six) hours as needed for wheezing or shortness of breath. 08/27/19   Chesley Noon, MD  allopurinol (ZYLOPRIM) 100 MG tablet Take 1 tablet (100 mg total) by mouth daily. 11/18/18   Stormy Fabian, PA  aspirin EC 81 MG tablet Take 81 mg daily by mouth.    [provider]  carvedilol (COREG) 25 MG tablet Take 1 tablet (25 mg total) by mouth 2 (two) times daily with a meal. 12/25/19 04/09/20  Darlin Priestly, MD  Cholecalciferol (VITAMIN D3) 1000 units CAPS Take 4,000 Units by mouth daily.     [provider]  colchicine 0.6 MG tablet Take 0.5 tablets (0.3 mg total) by mouth every other day. 12/25/19   Darlin Priestly, MD  metFORMIN (GLUCOPHAGE) 500 MG tablet Take 1,000 mg by mouth 2 (two) times daily with a meal.     [provider]  Multiple Vitamin (MULTIVITAMIN WITH MINERALS) TABS tablet Take 1 tablet by mouth daily.    [provider]  omeprazole (PRILOSEC) 20 MG capsule Take 1 capsule (20 mg total) by mouth daily. 04/09/20   Alver Sorrow, NP  sildenafil (REVATIO) 20 MG tablet TAKE 1 TABLET BY MOUTH THREE  TIMES A DAY 05/02/20   Alver Sorrow, NP  spironolactone (ALDACTONE) 25 MG tablet Take 0.5 tablets (12.5 mg total) by mouth daily. 04/09/20 10/06/20  Alver Sorrow, NP  torsemide (DEMADEX) 20 MG tablet TAKE 2 TABLETS (40 MG TOTAL) BY MOUTH 2 (TWO) TIMES DAILY. 04/17/20   Alver Sorrow, NP    Allergies  Allergen Reactions  . Tylenol [Acetaminophen] Anaphylaxis  . Dog Epithelium Allergy Skin Test Hives  . Apple Hives  . Other Hives    RAISINS    Family History  Problem Relation Age of Onset  . Hypertension Mother   . Hypertension Father   . CVA Father 61    Social History Social  History   Tobacco Use  . Smoking status: Former Smoker    Packs/day: 1.00    Years: 20.00    Pack years: 20.00    Quit date: 10/20/2010    Years since quitting: 9.5  . Smokeless tobacco: Never Used  Vaping Use  . Vaping Use: Never assessed  Substance Use Topics  . Alcohol use: Not Currently  . Drug use: No    Review of Systems Constitutional: Negative for fever. Cardiovascular: Negative for chest pain. Respiratory: Positive shortness of breath.  Occasional dry cough. Gastrointestinal: Negative for abdominal pain Musculoskeletal: Lower extremity edema with mild weeping bilaterally Neurological: Negative for headache All other ROS negative  ____________________________________________   PHYSICAL EXAM:  VITAL SIGNS: ED Triage Vitals  Enc Vitals Group     BP 05/05/20 0109 127/86     Pulse Rate 05/05/20 0109 97     Resp 05/05/20 0109 (!) 22     Temp 05/05/20 0109 97.9 F (36.6 C)     Temp Source 05/05/20 0109 Oral     SpO2 05/05/20 0109 (!) 87 %     Weight 05/05/20 0111 (!) 327 lb (148.3 kg)     Height 05/05/20 0111 5\' 6"  (1.676 m)     Head Circumference --      Peak Flow --      Pain Score 05/05/20 0110 7     Pain Loc --      Pain Edu? --      Excl. in GC? --     Constitutional: Alert and oriented. Well appearing and in no distress. Eyes: Normal exam ENT      Head: Normocephalic and atraumatic.      Mouth/Throat: Mucous membranes are moist. Cardiovascular: Normal rate, regular rhythm. Respiratory: Mild tachypnea.  Overall clear lung sounds. Gastrointestinal: Soft and nontender. No distention.  Obese. Musculoskeletal: 3+ lower extremity edema with bilateral weepage. Neurologic:  Normal speech and language. No gross focal neurologic deficits Skin:  Skin is warm, dry and intact.  Psychiatric: Mood and affect are normal.   ____________________________________________    EKG  EKG viewed and interpreted by myself shows a normal sinus rhythm at 94 bpm with a  narrow QRS, normal axis, normal intervals, nonspecific ST changes  ____________________________________________    RADIOLOGY  Vascular congestion.  ____________________________________________   INITIAL IMPRESSION / ASSESSMENT AND PLAN / ED COURSE  Pertinent labs & imaging results that were available during my care of the patient were reviewed by me and considered in my medical decision making (see chart for details).   Patient presents to the emergency department for shortness of breath progressively worsening over the past 1 week.  Patient satting 87% on room air.  Patient states he has increased his Lasix recently although does not know what dose  he takes.  Patient states he has not missed any doses.  Does not wear oxygen at home.  Patient is physical exam is consistent with fluid overload with significant peripheral edema with weepage.  Chest x-ray shows vascular congestion.  Given the patient's hypoxic state we will begin IV diuresis and admit to the hospitalist service.  Lab work is largely nonrevealing including negative troponin x2.  Aleric Froelich was evaluated in Emergency Department on 05/05/2020 for the symptoms described in the history of present illness. He was evaluated in the context of the global COVID-19 pandemic, which necessitated consideration that the patient might be at risk for infection with the SARS-CoV-2 virus that causes COVID-19. Institutional protocols and algorithms that pertain to the evaluation of patients at risk for COVID-19 are in a state of rapid change based on information released by regulatory bodies including the CDC and federal and state organizations. These policies and algorithms were followed during the patient's care in the ED.  ____________________________________________   FINAL CLINICAL IMPRESSION(S) / ED DIAGNOSES  Fluid overload Hypoxia   Minna Antis, MD 05/05/20 [phone]

## 2020-05-05 NOTE — Progress Notes (Signed)
PHARMACIST - PHYSICIAN COMMUNICATION  CONCERNING:  Enoxaparin (Lovenox) for DVT Prophylaxis    RECOMMENDATION: Patient was prescribed enoxaparin 40 mg q24 hours for VTE prophylaxis.   Filed Weights   05/05/20 0111  Weight: (!) 148.3 kg (327 lb)    Body mass index is 52.78 kg/m.  Estimated Creatinine Clearance: 84.2 mL/min (A) (by C-G formula based on SCr of 1.53 mg/dL (H)).   Based on [facility] policy patient is candidate for enoxaparin 40mg  every 12 hour dosing due to BMI being >40.   DESCRIPTION: Pharmacy has adjusted enoxaparin dose per [facility] policy.  Patient is now receiving enoxaparin 40mg  every 12 hours.     , PharmD Clinical Pharmacist  05/05/2020 10:08 AM

## 2020-05-05 NOTE — H&P (Signed)
History and Physical    Charles Rangel WCB:[id number] DOB: 05/28/1976 DOA: 05/05/2020  Referring MD/NP/PA:   PCP: [facility]   Patient coming from:  The patient is coming from home.  At baseline, pt is independent for most of ADL.        Chief Complaint: SOB  HPI: Charles Rangel is a 44 y.o. male with medical history significant of hypertension, diabetes mellitus, COPD, GERD, gout, OSA, DVT on anticoagulants, CHF, pulmonary hypertension, CKD stage II, who presents with shortness of breath.  Patient states that he has shortness breath for more than 1 week, which has been progressively worsening.  He denies cough, chest pain, fever or chills.  He has worsening bilateral leg edema.  He states that he has gained approximately 10 pounds recently.  He has nausea, no vomiting, diarrhea or abdominal pain.  No symptoms of UTI.  ED Course: pt was found to have BNP 46, troponin 6 --> 6, pending COVID-19 PCR, kidney function close to baseline, potassium 3.3, temperature normal, blood pressure 142/87, heart rate 85, RR 22, oxygen saturation 87% on room air, which improved to 100% on 3 L nasal cannula oxygen.  Chest x-ray showed vascular congestion without infiltration.  Patient is placed on progressive benefit observation.  Review of Systems:   General: no fevers, chills, has body weight gain, has poor appetite, has fatigue HEENT: no blurry vision, hearing changes or sore throat Respiratory: has dyspnea, no coughing, wheezing CV: no chest pain, no palpitations GI: has nausea, no vomiting, abdominal pain, diarrhea, constipation GU: no dysuria, burning on urination, increased urinary frequency, hematuria  Ext: has leg edema Neuro: no unilateral weakness, numbness, or tingling, no vision change or hearing loss Skin: no rash, no skin tear. MSK: No muscle spasm, no deformity, no limitation of range of movement in spin Heme: No easy bruising.  Travel history: No recent long distant  travel.  Allergy:  Allergies  Allergen Reactions  . Tylenol [Acetaminophen] Anaphylaxis  . Dog Epithelium Allergy Skin Test Hives  . Apple Hives  . Other Hives    RAISINS    Past Medical History:  Diagnosis Date  . (HFpEF) heart failure with preserved ejection fraction (HCC)    a. 02/2018 Echo: EF 65-70%, mildly dil LA.  . CHF (congestive heart failure) (HCC)   . COPD (chronic obstructive pulmonary disease) (HCC)   . Diabetes mellitus without complication (HCC)   . DVT (deep venous thrombosis) (HCC)   . Gout   . Hypertension   . Morbid obesity (HCC)   . OSA (obstructive sleep apnea)     Past Surgical History:  Procedure Laterality Date  . Left leg surgery for fracture    . RIGHT HEART CATH N/A 03/20/2020   Procedure: RIGHT HEART CATH;  Surgeon: Yvonne Kendall, MD;  Location: [facility] INVASIVE CV LAB;  Service: Cardiovascular;  Laterality: N/A;    Social History:  reports that he quit smoking about 9 years ago. He has a 20.00 pack-year smoking history. He has never used smokeless tobacco. He reports previous alcohol use. He reports that he does not use drugs.  Family History:  Family History  Problem Relation Age of Onset  . Hypertension Mother   . Hypertension Father   . CVA Father 23     Prior to Admission medications   Medication Sig Start Date End Date Taking? Authorizing Provider  albuterol (VENTOLIN HFA) 108 (90 Base) MCG/ACT inhaler Inhale 2 puffs into the lungs every 6 (six) hours as needed  for wheezing or shortness of breath. 08/27/19   Chesley Noon, MD  allopurinol (ZYLOPRIM) 100 MG tablet Take 1 tablet (100 mg total) by mouth daily. 11/18/18   Stormy Fabian, PA  aspirin EC 81 MG tablet Take 81 mg daily by mouth.    [provider]  carvedilol (COREG) 25 MG tablet Take 1 tablet (25 mg total) by mouth 2 (two) times daily with a meal. 12/25/19 04/09/20  Darlin Priestly, MD  Cholecalciferol (VITAMIN D3) 1000 units CAPS Take 4,000 Units by mouth daily.     [provider]  colchicine 0.6 MG tablet Take 0.5 tablets (0.3 mg total) by mouth every other day. 12/25/19   Darlin Priestly, MD  metFORMIN (GLUCOPHAGE) 500 MG tablet Take 1,000 mg by mouth 2 (two) times daily with a meal.     [provider]  Multiple Vitamin (MULTIVITAMIN WITH MINERALS) TABS tablet Take 1 tablet by mouth daily.    [provider]  omeprazole (PRILOSEC) 20 MG capsule Take 1 capsule (20 mg total) by mouth daily. 04/09/20   Alver Sorrow, NP  sildenafil (REVATIO) 20 MG tablet TAKE 1 TABLET BY MOUTH THREE TIMES A DAY 05/02/20   Alver Sorrow, NP  spironolactone (ALDACTONE) 25 MG tablet Take 0.5 tablets (12.5 mg total) by mouth daily. 04/09/20 10/06/20  Alver Sorrow, NP  torsemide (DEMADEX) 20 MG tablet TAKE 2 TABLETS (40 MG TOTAL) BY MOUTH 2 (TWO) TIMES DAILY. 04/17/20   Alver Sorrow, NP    Physical Exam: Vitals:   05/05/20 0548 05/05/20 0630 05/05/20 0700 05/05/20 0918  BP: (!) 141/93 135/72 (!) 142/87 (!) 154/80  Pulse: 84 84 85 79  Resp:   19 19  Temp:      TempSrc:      SpO2: 100% 100% 100% 100%  Weight:      Height:       General: Not in acute distress HEENT:       Eyes: PERRL, EOMI, no scleral icterus.       ENT: No discharge from the ears and nose, no pharynx injection, no tonsillar enlargement.        Neck: Difficult to assess JVD due to obesity, no bruit, no mass felt. Heme: No neck lymph node enlargement. Cardiac: S1/S2, RRR, No murmurs, No gallops or rubs. Respiratory: Has fine crackles bilaterally  GI: Soft, nondistended, nontender, no rebound pain, no organomegaly, BS present. GU: No hematuria Ext: 2+ pitting leg edema bilaterally.  Has chronic venous insufficiency change.  1+DP/PT pulse bilaterally. Musculoskeletal: No joint deformities, No joint redness or warmth, no limitation of ROM in spin. Skin: No rashes.  Neuro: Alert, oriented X3, cranial nerves II-XII grossly intact, moves all extremities normally.  Psych: Patient is  not psychotic, no suicidal or hemocidal ideation.  Labs on Admission: I have personally reviewed following labs and imaging studies  CBC: Recent Labs  Lab 05/05/20 0119  WBC 8.6  HGB 13.2  HCT 44.0  MCV 79.7*  PLT 284   Basic Metabolic Panel: Recent Labs  Lab 05/05/20 0119  NA 138  K 3.3*  CL 92*  CO2 32  GLUCOSE 129*  BUN 19  CREATININE 1.53*  CALCIUM 9.7   GFR: Estimated Creatinine Clearance: 84.2 mL/min (A) (by C-G formula based on SCr of 1.53 mg/dL (H)). Liver Function Tests: No results for input(s): AST, ALT, ALKPHOS, BILITOT, PROT, ALBUMIN in the last 168 hours. No results for input(s): LIPASE, AMYLASE in the last 168 hours. No results  for input(s): AMMONIA in the last 168 hours. Coagulation Profile: No results for input(s): INR, PROTIME in the last 168 hours. Cardiac Enzymes: No results for input(s): CKTOTAL, CKMB, CKMBINDEX, TROPONINI in the last 168 hours. BNP (last 3 results) No results for input(s): PROBNP in the last 8760 hours. HbA1C: No results for input(s): HGBA1C in the last 72 hours. CBG: Recent Labs  Lab 05/05/20 1004  GLUCAP 121*   Lipid Profile: No results for input(s): CHOL, HDL, LDLCALC, TRIG, CHOLHDL, LDLDIRECT in the last 72 hours. Thyroid Function Tests: No results for input(s): TSH, T4TOTAL, FREET4, T3FREE, THYROIDAB in the last 72 hours. Anemia Panel: No results for input(s): VITAMINB12, FOLATE, FERRITIN, TIBC, IRON, RETICCTPCT in the last 72 hours. Urine analysis:    Component Value Date/Time   COLORURINE YELLOW (A) 08/27/2017 0816   APPEARANCEUR CLEAR (A) 08/27/2017 0816   APPEARANCEUR Clear 07/28/2014 2317   LABSPEC 1.011 08/27/2017 0816   LABSPEC 1.024 07/28/2014 2317   PHURINE 6.0 08/27/2017 0816   GLUCOSEU NEGATIVE 08/27/2017 0816   GLUCOSEU Negative 07/28/2014 2317   HGBUR NEGATIVE 08/27/2017 0816   BILIRUBINUR NEGATIVE 08/27/2017 0816   BILIRUBINUR Negative 07/28/2014 2317   KETONESUR NEGATIVE 08/27/2017 0816    PROTEINUR NEGATIVE 08/27/2017 0816   NITRITE NEGATIVE 08/27/2017 0816   LEUKOCYTESUR NEGATIVE 08/27/2017 0816   LEUKOCYTESUR Negative 07/28/2014 2317   Sepsis Labs: @LABRCNTIP (procalcitonin:4,lacticidven:4) ) Recent Results (from the past 240 hour(s))  SARS Coronavirus 2 by RT PCR (hospital order[facility]) Nasopharyngeal Nasopharyngeal Swab     Status: None   Collection Time: 05/05/20  7:27 AM   Specimen: Nasopharyngeal Swab  Result Value Ref Range Status   SARS Coronavirus 2 NEGATIVE NEGATIVE Final    Comment: (NOTE) SARS-CoV-2 target nucleic acids are NOT DETECTED.  The SARS-CoV-2 RNA is generally detectable in upper and lower respiratory specimens during the acute phase of infection. The lowest concentration of SARS-CoV-2 viral copies this assay can detect is 250 copies / mL. A negative result does not preclude SARS-CoV-2 infection and should not be used as the sole basis for treatment or other patient management decisions.  A negative result may occur with improper specimen collection / handling, submission of specimen other than nasopharyngeal swab, presence of viral mutation(s) within the areas targeted by this assay, and inadequate number of viral copies (<250 copies / mL). A negative result must be combined with clinical observations, patient history, and epidemiological information.  Fact Sheet for Patients:   BoilerBrush.com.cyhttps://www.fda.gov/media/136312/download  Fact Sheet for Healthcare Providers: https://pope.com/https://www.fda.gov/media/136313/download  This test is not yet approved or  cleared by the [place] FDA and has been authorized for detection and/or diagnosis of SARS-CoV-2 by FDA under an Emergency Use Authorization (EUA).  This EUA will remain in effect (meaning this test can be used) for the duration of the COVID-19 declaration under Section 564(b)(1) of the Act, 21 U.S.C. section 360bbb-3(b)(1), unless the authorization is terminated  or revoked sooner.  Performed at [facility], [address] [place], [place] [address]      Radiological Exams on Admission: DG Chest 2 View  Result Date: 05/05/2020 CLINICAL DATA:  44 year old male with shortness of breath. EXAM: CHEST - 2 VIEW COMPARISON:  Chest radiograph dated 03/18/2020. FINDINGS: There is cardiomegaly with vascular congestion. Superimposed pneumonia is not excluded. Overall similar appearance of pulmonary vascular prominence as the prior radiograph. No new consolidative changes. There is no pleural effusion or pneumothorax. No acute osseous pathology. IMPRESSION: No significant interval change  in the vascular congestion and pulmonary arterial hypertension. No new consolidation. Electronically Signed   By: Elgie Collard M.D.   On: 05/05/2020 02:21     EKG: Independently reviewed.  Sinus rhythm, QTC 405, nonspecific T wave change.  Assessment/Plan Principal Problem:   Acute on chronic diastolic CHF (congestive heart failure) (HCC) Active Problems:   Gout   HTN (hypertension)   Pulmonary hypertension, unspecified (HCC)   CKD stage 2 due to type 2 diabetes mellitus (HCC)   GERD (gastroesophageal reflux disease)   Acute on chronic respiratory failure with hypoxia (HCC)   Hypokalemia  Acute on chronic respiratory failure with hypoxia due to acute on chronic diastolic CHF (congestive heart failure): Patient has 2+ leg edema, 10 pounds weight gain, vascular congestion on chest x-ray, consistent with CHF exacerbation.  BNP 46, which is falsely low due to obesity.  2D echo on 12/22/2019 showed EF of 60-65%.  -Place to progressive unit for observation -Lasix 60 mg bid by IV -Continue spironolactone -2d echo -Daily weights -strict I/O's -Low salt diet -Fluid restriction -Obtain REDs Vest reading  Gout -continue allopurinol and Colchicine  HTN (hypertension) -IV hydralazine as needed -Patient is on IV Lasix  Pulmonary hypertension, unspecified  (HCC) -Continue sildenafil  CKD stage 2 due to type 2 diabetes mellitus (HCC): Close to baseline.  Baseline creatinine 1.2-1.5 recently.  His creatinine is 1.53, BUN 19. -Follow-up by BMP  GERD (gastroesophageal reflux disease) -Protonix  Hypokalemia: K= 3.3 on admission. - Repleted - Check Mg level           DVT ppx: SQ Lovenox Code Status: Full code Family Communication: not done, no family member is at bed side.     Disposition Plan:  Anticipate discharge back to previous environment Consults called:  none Admission status:  progressive unit for obs   Status is: Observation  The patient remains OBS appropriate and will d/c before 2 midnights.  Dispo: The patient is from: Home              Anticipated d/c is to: Home              Anticipated d/c date is: 1 day              Patient currently is not medically stable to d/c.          Date of Service 05/05/2020    Lorretta Harp [facility]   If 7PM-7AM, please contact night-coverage [email or web address] 05/05/2020, 10:42 AM

## 2020-05-05 NOTE — ED Notes (Signed)
Urinal emptied of yellow urine. Pt with 3+ bilateral pedal to ankle and mid tibial edema. Pt denies fever, nausea, vomiting.

## 2020-05-06 ENCOUNTER — Inpatient Hospital Stay: Payer: No Typology Code available for payment source

## 2020-05-06 DIAGNOSIS — Z6841 Body Mass Index (BMI) 40.0 and over, adult: Secondary | ICD-10-CM | POA: Diagnosis not present

## 2020-05-06 DIAGNOSIS — K219 Gastro-esophageal reflux disease without esophagitis: Secondary | ICD-10-CM | POA: Diagnosis present

## 2020-05-06 DIAGNOSIS — Z20822 Contact with and (suspected) exposure to covid-19: Secondary | ICD-10-CM | POA: Diagnosis present

## 2020-05-06 DIAGNOSIS — Z87891 Personal history of nicotine dependence: Secondary | ICD-10-CM | POA: Diagnosis not present

## 2020-05-06 DIAGNOSIS — Z86718 Personal history of other venous thrombosis and embolism: Secondary | ICD-10-CM | POA: Diagnosis not present

## 2020-05-06 DIAGNOSIS — Z7984 Long term (current) use of oral hypoglycemic drugs: Secondary | ICD-10-CM | POA: Diagnosis not present

## 2020-05-06 DIAGNOSIS — J690 Pneumonitis due to inhalation of food and vomit: Secondary | ICD-10-CM | POA: Diagnosis present

## 2020-05-06 DIAGNOSIS — N182 Chronic kidney disease, stage 2 (mild): Secondary | ICD-10-CM | POA: Diagnosis present

## 2020-05-06 DIAGNOSIS — J9621 Acute and chronic respiratory failure with hypoxia: Secondary | ICD-10-CM | POA: Diagnosis present

## 2020-05-06 DIAGNOSIS — E1122 Type 2 diabetes mellitus with diabetic chronic kidney disease: Secondary | ICD-10-CM | POA: Diagnosis present

## 2020-05-06 DIAGNOSIS — I272 Pulmonary hypertension, unspecified: Secondary | ICD-10-CM | POA: Diagnosis not present

## 2020-05-06 DIAGNOSIS — Z7982 Long term (current) use of aspirin: Secondary | ICD-10-CM | POA: Diagnosis not present

## 2020-05-06 DIAGNOSIS — A419 Sepsis, unspecified organism: Secondary | ICD-10-CM | POA: Diagnosis present

## 2020-05-06 DIAGNOSIS — E876 Hypokalemia: Secondary | ICD-10-CM | POA: Diagnosis present

## 2020-05-06 DIAGNOSIS — N179 Acute kidney failure, unspecified: Secondary | ICD-10-CM | POA: Diagnosis present

## 2020-05-06 DIAGNOSIS — I2729 Other secondary pulmonary hypertension: Secondary | ICD-10-CM | POA: Diagnosis present

## 2020-05-06 DIAGNOSIS — R579 Shock, unspecified: Secondary | ICD-10-CM | POA: Diagnosis present

## 2020-05-06 DIAGNOSIS — J9601 Acute respiratory failure with hypoxia: Secondary | ICD-10-CM | POA: Diagnosis not present

## 2020-05-06 DIAGNOSIS — R0902 Hypoxemia: Secondary | ICD-10-CM | POA: Diagnosis present

## 2020-05-06 DIAGNOSIS — G9341 Metabolic encephalopathy: Secondary | ICD-10-CM | POA: Diagnosis present

## 2020-05-06 DIAGNOSIS — G4733 Obstructive sleep apnea (adult) (pediatric): Secondary | ICD-10-CM | POA: Diagnosis present

## 2020-05-06 DIAGNOSIS — J449 Chronic obstructive pulmonary disease, unspecified: Secondary | ICD-10-CM | POA: Diagnosis present

## 2020-05-06 DIAGNOSIS — I5033 Acute on chronic diastolic (congestive) heart failure: Secondary | ICD-10-CM | POA: Diagnosis present

## 2020-05-06 DIAGNOSIS — N183 Chronic kidney disease, stage 3 unspecified: Secondary | ICD-10-CM | POA: Diagnosis not present

## 2020-05-06 DIAGNOSIS — Z79899 Other long term (current) drug therapy: Secondary | ICD-10-CM | POA: Diagnosis not present

## 2020-05-06 DIAGNOSIS — M109 Gout, unspecified: Secondary | ICD-10-CM | POA: Diagnosis present

## 2020-05-06 DIAGNOSIS — J9622 Acute and chronic respiratory failure with hypercapnia: Secondary | ICD-10-CM | POA: Diagnosis present

## 2020-05-06 DIAGNOSIS — I130 Hypertensive heart and chronic kidney disease with heart failure and stage 1 through stage 4 chronic kidney disease, or unspecified chronic kidney disease: Secondary | ICD-10-CM | POA: Diagnosis present

## 2020-05-06 LAB — GLUCOSE, CAPILLARY
Glucose-Capillary: 122 mg/dL — ABNORMAL HIGH (ref 70–99)
Glucose-Capillary: 129 mg/dL — ABNORMAL HIGH (ref 70–99)
Glucose-Capillary: 140 mg/dL — ABNORMAL HIGH (ref 70–99)
Glucose-Capillary: 161 mg/dL — ABNORMAL HIGH (ref 70–99)
Glucose-Capillary: 169 mg/dL — ABNORMAL HIGH (ref 70–99)
Glucose-Capillary: 170 mg/dL — ABNORMAL HIGH (ref 70–99)

## 2020-05-06 LAB — BLOOD GAS, ARTERIAL
Acid-Base Excess: 14.9 mmol/L — ABNORMAL HIGH (ref 0.0–2.0)
Bicarbonate: 43.1 mmol/L — ABNORMAL HIGH (ref 20.0–28.0)
FIO2: 0.32
O2 Saturation: 96.6 %
Patient temperature: 37
pCO2 arterial: 68 mmHg (ref 32.0–48.0)
pH, Arterial: 7.41 (ref 7.350–7.450)
pO2, Arterial: 86 mmHg (ref 83.0–108.0)

## 2020-05-06 LAB — CBC
HCT: 45.3 % (ref 39.0–52.0)
Hemoglobin: 14 g/dL (ref 13.0–17.0)
MCH: 24.7 pg — ABNORMAL LOW (ref 26.0–34.0)
MCHC: 30.9 g/dL (ref 30.0–36.0)
MCV: 80 fL (ref 80.0–100.0)
Platelets: 267 10*3/uL (ref 150–400)
RBC: 5.66 MIL/uL (ref 4.22–5.81)
RDW: 18.6 % — ABNORMAL HIGH (ref 11.5–15.5)
WBC: 8.2 10*3/uL (ref 4.0–10.5)
nRBC: 0 % (ref 0.0–0.2)

## 2020-05-06 LAB — BASIC METABOLIC PANEL
Anion gap: 13 (ref 5–15)
BUN: 21 mg/dL — ABNORMAL HIGH (ref 6–20)
CO2: 34 mmol/L — ABNORMAL HIGH (ref 22–32)
Calcium: 10.3 mg/dL (ref 8.9–10.3)
Chloride: 96 mmol/L — ABNORMAL LOW (ref 98–111)
Creatinine, Ser: 1.2 mg/dL (ref 0.61–1.24)
GFR calc Af Amer: >60 mL/min (ref >60)
GFR calc non Af Amer: >60 mL/min (ref >60)
Glucose, Bld: 141 mg/dL — ABNORMAL HIGH (ref 70–99)
Potassium: 4.2 mmol/L (ref 3.5–5.1)
Sodium: 143 mmol/L (ref 135–145)

## 2020-05-06 LAB — MAGNESIUM: Magnesium: 2.3 mg/dL (ref 1.7–2.4)

## 2020-05-06 MED ORDER — SPIRONOLACTONE 25 MG PO TABS
25.0000 mg | ORAL_TABLET | Freq: Every day | ORAL | Status: DC
  Administered 2020-05-07: 25 mg via ORAL
  Filled 2020-05-06: qty 1

## 2020-05-06 MED ORDER — METOLAZONE 5 MG PO TABS
5.0000 mg | ORAL_TABLET | Freq: Once | ORAL | Status: AC
  Administered 2020-05-06: 5 mg via ORAL
  Filled 2020-05-06: qty 1

## 2020-05-06 MED ORDER — FUROSEMIDE 10 MG/ML IJ SOLN
80.0000 mg | Freq: Once | INTRAMUSCULAR | Status: AC
  Administered 2020-05-06: 80 mg via INTRAVENOUS
  Filled 2020-05-06: qty 8

## 2020-05-06 MED ORDER — FUROSEMIDE 10 MG/ML IJ SOLN
80.0000 mg | Freq: Two times a day (BID) | INTRAMUSCULAR | Status: DC
  Administered 2020-05-07: 80 mg via INTRAVENOUS

## 2020-05-06 NOTE — Progress Notes (Signed)
Pt asleep on auto cpap. Pressure currently reading 15 cm.

## 2020-05-06 NOTE — Progress Notes (Signed)
CRITICAL VALUE ALERT  Critical Value:  CO2 68  Date & Time Notied:  05/06/2020 at 12:27  Provider Notified: Dr. Denton Lank  Orders Received/Actions taken: Bipap order placed and respiratory made aware.

## 2020-05-06 NOTE — Hospital Course (Addendum)
Charles Rangel is a 44 y.o. male with medical history of hypertension, diabetes mellitus, COPD, GERD, gout, OSA, remote DVT provoked by prolonged travel, CHF, pulmonary hypertension, CKD stage II, morbid obesity who presented to the ED on 05/05/20 with progressively worsening shortness of breath for a week, worsening lower extremity edema and 10 pound weight gain.  In the ED, afebrile, BP 142/87, tachypneic, O2 sat 87% on room air, improved to 100% on 3 L/min supplemental oxygen.  Labs notable for normal troponin x 2, BNP 46 (likely false low due to obesity), renal function near baseline, K 3.3.  Chest xray showed vascular congestion without infiltrates.   Admitted to hospitalist service.  Undergoing IV diuresis.  Cardiology consulted.

## 2020-05-06 NOTE — Consult Note (Addendum)
Cardiology Consultation:   Patient ID: Charles Rangel MRN: [id number]; DOB: 1976/09/26  Admit date: 05/05/2020 Date of Consult: 05/06/2020  Primary Care Provider: [facility] Cardiologist: Lorine Bears, MD  [facility] Electrophysiologist:  None    Patient Profile:   Charles Rangel is a 44 y.o. male with a hx of CHF who is being seen today for the evaluation of acute on chronic diastolic heart failure at the request of Denton Lank.  History of Present Illness:   Mr. Charles Rangel is a 44 year old male with multiple comorbidities, hospitalized for acute on chronic heart failure.  Medical problems include morbid obesity with BMI of 53, type 2 diabetes, obstructive sleep apnea, and gout.  The patient has had multiple hospitalizations for acute on chronic heart failure requiring IV diuretic therapy.  Prior to this admission, most recently he was hospitalized January 17, 2020.  During that admission he underwent right heart catheterization which demonstrated findings consistent with moderately elevated left heart filling pressures, severely elevated PA pressure and right heart pressures, and preserved cardiac output.  He was started on empiric sildenafil and continued on his outpatient diuretic regimen but transition from furosemide to torsemide on discharge.  The patient is in the room alone at the time of my evaluation.  He is sitting up in a chair.  He states that fluid has been building up for at least a week as he has noticed increasing swelling in his legs and discomfort in both lower extremities.  His breathing has become more labored and he has had some shortness of breath at rest.  He denies orthopnea or PND.  He denies chest pain or pressure.  He has had no lightheadedness or frank syncope.  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  He works 8-5 Monday through Friday.  He denies adding salt to his food but admits to not following a strict low-sodium  diet.   Past Medical History:  Diagnosis Date  . (HFpEF) heart failure with preserved ejection fraction (HCC)    a. 02/2018 Echo: EF 65-70%, mildly dil LA.  . CHF (congestive heart failure) (HCC)   . COPD (chronic obstructive pulmonary disease) (HCC)   . Diabetes mellitus without complication (HCC)   . DVT (deep venous thrombosis) (HCC)   . Gout   . Hypertension   . Morbid obesity (HCC)   . OSA (obstructive sleep apnea)     Past Surgical History:  Procedure Laterality Date  . Left leg surgery for fracture    . RIGHT HEART CATH N/A 03/20/2020   Procedure: RIGHT HEART CATH;  Surgeon: Yvonne Kendall, MD;  Location: [facility] INVASIVE CV LAB;  Service: Cardiovascular;  Laterality: N/A;     Home Medications:  Prior to Admission medications   Medication Sig Start Date End Date Taking? Authorizing Provider  albuterol (VENTOLIN HFA) 108 (90 Base) MCG/ACT inhaler Inhale 2 puffs into the lungs every 6 (six) hours as needed for wheezing or shortness of breath. 08/27/19  Yes Chesley Noon, MD  allopurinol (ZYLOPRIM) 100 MG tablet Take 1 tablet (100 mg total) by mouth daily. 11/18/18  Yes Lule, Joana, PA  aspirin EC 81 MG tablet Take 81 mg daily by mouth.   Yes [provider]  carvedilol (COREG) 25 MG tablet Take 1 tablet (25 mg total) by mouth 2 (two) times daily with a meal. 12/25/19 05/05/20 Yes Darlin Priestly, MD  Cholecalciferol (VITAMIN D3) 1000 units CAPS Take 4,000 Units by mouth daily.  Yes [provider]  colchicine 0.6 MG tablet Take 0.5 tablets (0.3 mg total) by mouth every other day. 12/25/19  Yes Darlin PriestlyLai, Tina, MD  metFORMIN (GLUCOPHAGE) 500 MG tablet Take 1,000 mg by mouth 2 (two) times daily with a meal.    Yes [provider]  Multiple Vitamin (MULTIVITAMIN WITH MINERALS) TABS tablet Take 1 tablet by mouth daily.   Yes [provider]  omeprazole (PRILOSEC) 20 MG capsule Take 1 capsule (20 mg total) by mouth daily. 04/09/20  Yes Alver SorrowWalker, Caitlin S, NP   sildenafil (REVATIO) 20 MG tablet TAKE 1 TABLET BY MOUTH THREE TIMES A DAY 05/02/20  Yes Alver SorrowWalker, Caitlin S, NP  spironolactone (ALDACTONE) 25 MG tablet Take 0.5 tablets (12.5 mg total) by mouth daily. 04/09/20 10/06/20 Yes Alver SorrowWalker, Caitlin S, NP  torsemide (DEMADEX) 20 MG tablet TAKE 2 TABLETS (40 MG TOTAL) BY MOUTH 2 (TWO) TIMES DAILY. 04/17/20  Yes Alver SorrowWalker, Caitlin S, NP    Inpatient Medications: Scheduled Meds: . allopurinol  100 mg Oral Daily  . aspirin EC  81 mg Oral Daily  . carvedilol  25 mg Oral BID WC  . cholecalciferol  4,000 Units Oral Daily  . colchicine  0.3 mg Oral QODAY  . enoxaparin (LOVENOX) injection  40 mg Subcutaneous Q12H  . furosemide  60 mg Intravenous Q12H  . insulin aspart  0-9 Units Subcutaneous Q4H  . ipratropium-albuterol  3 mL Nebulization Q4H  . multivitamin with minerals  1 tablet Oral Daily  . pantoprazole  40 mg Oral Daily  . sildenafil  20 mg Oral TID  . sodium chloride flush  3 mL Intravenous Q12H  . spironolactone  12.5 mg Oral Daily   Continuous Infusions: . sodium chloride     PRN Meds: sodium chloride, albuterol, dextromethorphan-guaiFENesin, hydrALAZINE, ondansetron (ZOFRAN) IV, sodium chloride flush  Allergies:    Allergies  Allergen Reactions  . Tylenol [Acetaminophen] Anaphylaxis  . Dog Epithelium Allergy Skin Test Hives  . Apple Hives  . Other Hives    RAISINS    Social History:   Social History   Socioeconomic History  . Marital status: Married    Spouse name: Not on file  . Number of children: Not on file  . Years of education: Not on file  . Highest education level: Not on file  Occupational History  . Not on file  Tobacco Use  . Smoking status: Former Smoker    Packs/day: 1.00    Years: 20.00    Pack years: 20.00    Quit date: 10/20/2010    Years since quitting: 9.5  . Smokeless tobacco: Never Used  Vaping Use  . Vaping Use: Never assessed  Substance and Sexual Activity  . Alcohol use: Not Currently  . Drug use:  No  . Sexual activity: Not on file  Other Topics Concern  . Not on file  Social History Narrative   [place] in [place].  Exercises @ the gym 4-5x/wk - treadmill @ 2-2.5 mph x 1 hr.   Social Determinants of Health   Financial Resource Strain:   . Difficulty of Paying Living Expenses:   Food Insecurity:   . Worried About Programme researcher, broadcasting/film/videounning Out of Food in the Last Year:   . Baristaan Out of Food in the Last Year:   Transportation Needs:   . Freight forwarderLack of Transportation (Medical):   Marland Kitchen. Lack of Transportation (Non-Medical):   Physical Activity:   . Days of Exercise per Week:   . Minutes of  Exercise per Session:   Stress:   . Feeling of Stress :   Social Connections:   . Frequency of Communication with Friends and Family:   . Frequency of Social Gatherings with Friends and Family:   . Attends Religious Services:   . Active Member of Clubs or Organizations:   . Attends Banker Meetings:   Marland Kitchen Marital Status:   Intimate Partner Violence:   . Fear of Current or Ex-Partner:   . Emotionally Abused:   Marland Kitchen Physically Abused:   . Sexually Abused:     Family History:    Family History  Problem Relation Age of Onset  . Hypertension Mother   . Hypertension Father   . CVA Father 45     ROS:  Please see the history of present illness.  All other ROS reviewed and negative.     Physical Exam/Data:   Vitals:   05/06/20 0327 05/06/20 0351 05/06/20 0700 05/06/20 0747  BP:  135/74  116/67  Pulse:  95  90  Resp:  20  18  Temp:  98.5 F (36.9 C)  98.2 F (36.8 C)  TempSrc:  Oral    SpO2: 97% 97% 92% 91%  Weight:  (!) 150.3 kg    Height:        Intake/Output Summary (Last 24 hours) at 05/06/2020 1126 Last data filed at 05/06/2020 0953 Gross per 24 hour  Intake 726 ml  Output 3100 ml  Net -2374 ml   Last 3 Weights 05/06/2020 05/05/2020 05/05/2020  Weight (lbs) 331 lb 6.4 oz 332 lb 1.6 oz 327 lb  Weight (kg) 150.322 kg 150.64 kg 148.326 kg     Body mass index is 53.49 kg/m.   General:  Well nourished, well developed, morbidly obese male in no acute distress, somnolent but converses appropriately  HEENT: normal Lymph: no adenopathy Neck: Moderate JVD Endocrine:  No thryomegaly Vascular: No carotid bruits;   Cardiac:  normal S1, S2; RRR; no murmur  Lungs: Diminished breath sounds in the bases bilaterally Abd: soft, nontender, no hepatomegaly  Ext: Diffuse 3+ edema in the lower extremities extending up to the back of the thighs bilaterally left worse than right Musculoskeletal:  No deformities, BUE and BLE strength normal and equal Skin: Stasis dermatitis of both lower extremities noted Neuro:  CNs 2-12 intact, no focal abnormalities noted Psych:  Normal affect   EKG:  The EKG was personally reviewed and demonstrates: Normal sinus rhythm 94 bpm, within normal limits  Telemetry:  Telemetry was personally reviewed and demonstrates: Normal sinus rhythm with no significant arrhythmia  Relevant CV Studies: 2D echocardiogram 05/05/2020: IMPRESSIONS    1. Left ventricular ejection fraction, by estimation, is 60 to 65%. The  left ventricle has normal function. The left ventricle has no regional  wall motion abnormalities. Left ventricular diastolic parameters are  indeterminate.  2. Right ventricular systolic function is normal. The right ventricular  size is normal. Tricuspid regurgitation signal is inadequate for assessing  PA pressure.   RHC 03/20/2020: Right Heart Pressures RA (a): 23 mmHg RV (S/EDP): 72/25 mmHg PA (S/D, mean): 70/47 (55)mmHg PCWP (mean): 30 mmHg  Ao sat: 90% PA sat: 72%  Fick CO: 8.7 L/min Fick CI: 3.6 L/min/m^2  PVR: 2.9 Wood units    Laboratory Data:  High Sensitivity Troponin:   Recent Labs  Lab 05/05/20 0119 05/05/20 0455  TROPONINIHS 6 6     Chemistry Recent Labs  Lab 05/05/20 0119 05/06/20 0602  NA 138 143  K 3.3* 4.2  CL 92* 96*  CO2 32 34*  GLUCOSE 129* 141*  BUN 19 21*  CREATININE 1.53* 1.20  CALCIUM  9.7 10.3  GFRNONAA 54* >60  GFRAA >60 >60  ANIONGAP 14 13    No results for input(s): PROT, ALBUMIN, AST, ALT, ALKPHOS, BILITOT in the last 168 hours. Hematology Recent Labs  Lab 05/05/20 0119 05/06/20 0602  WBC 8.6 8.2  RBC 5.52 5.66  HGB 13.2 14.0  HCT 44.0 45.3  MCV 79.7* 80.0  MCH 23.9* 24.7*  MCHC 30.0 30.9  RDW 17.9* 18.6*  PLT 284 267   BNP Recent Labs  Lab 05/05/20 0119  BNP 46.0    DDimer No results for input(s): DDIMER in the last 168 hours.   Radiology/Studies:  DG Chest 2 View  Result Date: 05/05/2020 CLINICAL DATA:  44 year old male with shortness of breath. EXAM: CHEST - 2 VIEW COMPARISON:  Chest radiograph dated 03/18/2020. FINDINGS: There is cardiomegaly with vascular congestion. Superimposed pneumonia is not excluded. Overall similar appearance of pulmonary vascular prominence as the prior radiograph. No new consolidative changes. There is no pleural effusion or pneumothorax. No acute osseous pathology. IMPRESSION: No significant interval change in the vascular congestion and pulmonary arterial hypertension. No new consolidation. Electronically Signed   By: Elgie Collard M.D.   On: 05/05/2020 02:21   ECHOCARDIOGRAM COMPLETE  Result Date: 05/05/2020    ECHOCARDIOGRAM REPORT   Patient Name:   KROSS SWALLOWS Date of Exam: 05/05/2020 Medical Rec #:  [id number]        Height:       66.0 in Accession #:    [id number]       Weight:       327.0 lb Date of Birth:  1975/11/13        BSA:          2.465 m Patient Age:    44 years         BP:           156/109 mmHg Patient Gender: M                HR:           87 bpm. Exam Location:  [facility] Procedure: 2D Echo and Intracardiac Opacification Agent Indications:     Diastolic CHF  History:         Patient has prior history of Echocardiogram examinations. CHF,                  COPD; Risk Factors:Hypertension and Morbid Obesity.  Sonographer:     L Thornton-Maynard Referring Phys:  [id number] Brien Few NIU Diagnosing Phys: Julien Nordmann  MD  Sonographer Comments: No apical window, no subcostal window, Technically challenging study due to limited acoustic windows and patient is morbidly obese. Image acquisition challenging due to COPD. IMPRESSIONS  1. Left ventricular ejection fraction, by estimation, is 60 to 65%. The left ventricle has normal function. The left ventricle has no regional wall motion abnormalities. Left ventricular diastolic parameters are indeterminate.  2. Right ventricular systolic function is normal. The right ventricular size is normal. Tricuspid regurgitation signal is inadequate for assessing PA pressure. FINDINGS  Left Ventricle: Left ventricular ejection fraction, by estimation, is 60 to 65%. The left ventricle has normal function. The left ventricle has no regional wall motion abnormalities. Definity contrast agent was given IV to delineate the left ventricular  endocardial borders. The left ventricular internal cavity size was normal in size. There is no left  ventricular hypertrophy. Left ventricular diastolic parameters are indeterminate. Right Ventricle: The right ventricular size is normal. No increase in right ventricular wall thickness. Right ventricular systolic function is normal. Tricuspid regurgitation signal is inadequate for assessing PA pressure. Left Atrium: Left atrial size was normal in size. Right Atrium: Right atrial size was normal in size. Pericardium: There is no evidence of pericardial effusion. Mitral Valve: The mitral valve was not well visualized. Normal mobility of the mitral valve leaflets. No evidence of mitral valve regurgitation. No evidence of mitral valve stenosis. Tricuspid Valve: The tricuspid valve is not well visualized. Tricuspid valve regurgitation is not demonstrated. No evidence of tricuspid stenosis. Aortic Valve: The aortic valve was not well visualized. Aortic valve regurgitation is not visualized. No aortic stenosis is present. Aortic valve mean gradient measures 5.0 mmHg. Aortic  valve peak gradient measures 8.2 mmHg. Aortic valve area, by VTI measures 2.97 cm. Pulmonic Valve: The pulmonic valve was normal in structure. Pulmonic valve regurgitation is not visualized. No evidence of pulmonic stenosis. Aorta: The aortic root is normal in size and structure. Venous: The inferior vena cava is normal in size with greater than 50% respiratory variability, suggesting right atrial pressure of 3 mmHg. IAS/Shunts: No atrial level shunt detected by color flow Doppler.  LEFT VENTRICLE PLAX 2D LVIDd:         3.59 cm  Diastology LVIDs:         2.40 cm  LV e' lateral:   9.14 cm/s LV PW:         1.64 cm  LV E/e' lateral: 15.1 LV IVS:        1.78 cm  LV e' medial:    7.13 cm/s LVOT diam:     2.20 cm  LV E/e' medial:  19.4 LV SV:         87 LV SV Index:   35 LVOT Area:     3.80 cm  LEFT ATRIUM         Index LA diam:    4.30 cm 1.74 cm/m  AORTIC VALVE                    PULMONIC VALVE AV Area (Vmax):    3.13 cm     PV Vmax:       1.32 m/s AV Area (Vmean):   2.91 cm     PV Peak grad:  7.0 mmHg AV Area (VTI):     2.97 cm AV Vmax:           143.50 cm/s AV Vmean:          107.150 cm/s AV VTI:            0.292 m AV Peak Grad:      8.2 mmHg AV Mean Grad:      5.0 mmHg LVOT Vmax:         118.00 cm/s LVOT Vmean:        81.900 cm/s LVOT VTI:          0.228 m LVOT/AV VTI ratio: 0.78  AORTA Ao Root diam: 3.20 cm MITRAL VALVE MV Area (PHT): 3.30 cm     SHUNTS MV Decel Time: 230 msec     Systemic VTI:  0.23 m MV E velocity: 138.00 cm/s  Systemic Diam: 2.20 cm MV A velocity: 83.10 cm/s MV E/A ratio:  1.66 Julien Nordmann MD Electronically signed by Julien Nordmann MD Signature Date/Time: 05/05/2020/9:46:08 PM    Final  Assessment and Plan:   1. Acute on chronic diastolic heart failure: Patient has marked volume overload with tense edema of his lower extremities extending up into the thighs.  Again reviewed need for fluid and sodium restriction on an outpatient basis.  It seems like he has been compliant with  twice daily torsemide but suspect he will need a higher dose at discharge.  Current treatment includes carvedilol and spironolactone.  Will increase spironolactone to 25 mg daily.  In order to stimulate diuresis, will give him a single dose of metolazone followed by furosemide 80 mg IV.  Will increase his twice daily furosemide dosing to 80 mg.  He is somnolent on evaluation - will check ABG, suspect he will need BiPap. We will follow closely with you during his hospitalization.  2.  Hypertensive heart disease: See above  3.  Pulmonary hypertension: Patient treated with sildenafil.  Reviewed right heart catheterization data.  Noted to have severe pulmonary hypertension, but also severely elevated left heart pressures (PVR 2.9 Woods Units, PCWP 30, mean PAP 55 mmHg).  Suspect pulmonary hypertension is multifactorial with significant left heart disease/diastolic dysfunction as well as uncontrolled sleep apnea and morbid obesity.  Continue heart failure treatment, use of CPAP, and sildenafil.  4. Morbid obesity: major issue, very difficult to manage problems 1-3 with severe obesity.  For questions or updates, please contact [facility] Please consult [email or web address] for contact info under   Signed, Tonny Bollman, MD  05/06/2020 11:26 AM

## 2020-05-06 NOTE — Progress Notes (Addendum)
PROGRESS NOTE    Charles Rangel   GYJ:[id number]  DOB: 07/19/1976  PCP: [facility]    DOA: 05/05/2020 LOS: 0   Brief Narrative   Charles Rangel is a 44 y.o. male with medical history of hypertension, diabetes mellitus, COPD, GERD, gout, OSA, remote DVT provoked by prolonged travel, CHF, pulmonary hypertension, CKD stage II, morbid obesity who presented to the ED on 05/05/20 with progressively worsening shortness of breath for a week, worsening lower extremity edema and 10 pound weight gain.  In the ED, afebrile, BP 142/87, tachypneic, O2 sat 87% on room air, improved to 100% on 3 L/min supplemental oxygen.  Labs notable for normal troponin x 2, BNP 46 (likely false low due to obesity), renal function near baseline, K 3.3.  Chest xray showed vascular congestion without infiltrates.   Admitted to hospitalist service.  Undergoing IV diuresis.  Cardiology consulted.      Assessment & Plan   Principal Problem:   Acute on chronic diastolic CHF (congestive heart failure) (HCC) Active Problems:   Pulmonary hypertension, unspecified (HCC)   HTN (hypertension)   CKD stage 2 due to type 2 diabetes mellitus (HCC)   Gout   GERD (gastroesophageal reflux disease)   Acute on chronic respiratory failure with hypoxia (HCC)   Hypokalemia  Acute on chronic respiratory failure with hypoxia and hypercapnea secondary to Acute on chronic diastolic CHF -present on admission with tense lower extremity edema, 10 pound weight gain, vascular congestion on chest x-ray all consistent with CHF decompensation.  Echo obtained and showed EF 60 to 65% with indeterminate diastolic parameters.  Cardiology consulted.  Monitor volume status closely with strict I/O's and daily weights.  Low-sodium diet with fluid restriction.  Continue Coreg and spironolactone.  Metolazone given today followed by Lasix 80 mg IV.  Diuresis per cardiology.  Acute metabolic encephalopathy secondary to  Chronic?  Hypercapneic respiratory failure - suspect chronic, secondary to obesity hypoventilation syndrome.  PCO2 on ABG today was 68.  He was very drowsy and falling asleep during encounter this AM.  Start on Bipap.  Monitor.  Right leg pain - with swelling greater on right than left, some tenderness at proximal calf.  Right LE doppler U/S is pending to look for DVT.    Hypokalemia - POA with K 3.3, replaced.  Monitor and replace as needed.  CKD stage 2 - stable. Presented with Cr 1.53, baseline creatinine 1.2-1.5 recently.  Monitor BMP.  Avoid nephrotoxins and hypotension.    Essential hypertension -, stable.  Home regimen appears to be Coreg, spironolactone and torsemide.  Currently on IV Lasix with torsemide held.  Continue Coreg and spironolactone.  As needed hydralazine.  Gout - continue allopurinol and Colchicine  Severe Pulmonary hypertension - Continue sildenafil.  Contributes to hypoxic respiratory failure.  GERD - continue Protonix  Morbid Obesity: Body mass index is 53.49 kg/m. Complicates overall care and prognosis including respiratory status given thoracic restriction.    DVT prophylaxis: enoxaparin (LOVENOX) injection 40 mg Start: 05/05/20 1015   Diet:  Diet Orders (From admission, onward)    Start     Ordered   05/05/20 0958  Diet heart healthy/carb modified Room service appropriate? Yes; Fluid consistency: Thin; Fluid restriction: 1500 mL Fluid  Diet effective now       Question Answer Comment  Diet-HS Snack? Nothing   Room service appropriate? Yes   Fluid consistency: Thin   Fluid restriction: 1500 mL Fluid      05/05/20 0957  Code Status: Full Code    Subjective 05/06/20    Patient seen up in chair this morning after breakfast.  He reports feeling about the same today, slightly better.  He continually nods off to sleep during the encounter.  Says he slept okay last night.  Regarding his lower extremity edema, he says his right leg is usually worse  than the left but this time is the left worse than the right.  Also reports some tenderness on the posterior left lower leg.  Denies fevers chills, chest pain, nausea vomiting diarrhea or other complaints.   Disposition Plan & Communication   Status is: Inpatient  Remains inpatient appropriate because:IV treatments appropriate due to intensity of illness or inability to take PO   Dispo:  Patient From: Home  Planned Disposition: Home  Expected discharge date: 05/08/20  Medically stable for discharge: No    Family Communication: None at bedside, will attempt to call   Consults, Procedures, Significant Events   Consultants:   Cardiology  Procedures:   Echo 7/17 -EF 60 to 65%, indeterminate diastolic parameters  Antimicrobials:   None   Objective   Vitals:   05/06/20 0351 05/06/20 0700 05/06/20 0747 05/06/20 1148  BP: 135/74  116/67 (!) 119/96  Pulse: 95  90 89  Resp: 20  18 18   Temp: 98.5 F (36.9 C)  98.2 F (36.8 C) 98.4 F (36.9 C)  TempSrc: Oral   Oral  SpO2: 97% 92% 91% 98%  Weight: (!) 150.3 kg     Height:        Intake/Output Summary (Last 24 hours) at 05/06/2020 1511 Last data filed at 05/06/2020 1454 Gross per 24 hour  Intake 726 ml  Output 3650 ml  Net -2924 ml   Filed Weights   05/05/20 0111 05/05/20 1400 05/06/20 0351  Weight: (!) 148.3 kg (!) 150.6 kg (!) 150.3 kg    Physical Exam:  General exam: awake, alert, no acute distress, morbidly obese HEENT: moist mucus membranes, hearing grossly normal  Respiratory system: Diminished due to body habitus unable to appreciate crackles, wheezes, or rhonchi, normal respiratory effort. Cardiovascular system: normal S1/S2, RRR, tense lower extremity edema L>R.   Gastrointestinal system: soft, NT, ND, +bowel sounds. Central nervous system: A&O x3. no gross focal neurologic deficits, normal speech Extremities: moves all, left lower extremity diameters greater than right, both with tense edema left  lower extremity proximal calf with an area of swelling and tenderness. Skin: dry, intact, normal temperature, normal color Psychiatry: normal mood, congruent affect, judgement and insight appear normal  Labs   Data Reviewed: I have personally reviewed following labs and imaging studies  CBC: Recent Labs  Lab 05/05/20 0119 05/06/20 0602  WBC 8.6 8.2  HGB 13.2 14.0  HCT 44.0 45.3  MCV 79.7* 80.0  PLT 284 267   Basic Metabolic Panel: Recent Labs  Lab 05/05/20 0119 05/06/20 0602  NA 138 143  K 3.3* 4.2  CL 92* 96*  CO2 32 34*  GLUCOSE 129* 141*  BUN 19 21*  CREATININE 1.53* 1.20  CALCIUM 9.7 10.3  MG  --  2.3   GFR: Estimated Creatinine Clearance: 108.2 mL/min (by C-G formula based on SCr of 1.2 mg/dL). Liver Function Tests: No results for input(s): AST, ALT, ALKPHOS, BILITOT, PROT, ALBUMIN in the last 168 hours. No results for input(s): LIPASE, AMYLASE in the last 168 hours. No results for input(s): AMMONIA in the last 168 hours. Coagulation Profile: No results for input(s): INR, PROTIME in the  last 168 hours. Cardiac Enzymes: No results for input(s): CKTOTAL, CKMB, CKMBINDEX, TROPONINI in the last 168 hours. BNP (last 3 results) No results for input(s): PROBNP in the last 8760 hours. HbA1C: No results for input(s): HGBA1C in the last 72 hours. CBG: Recent Labs  Lab 05/05/20 1954 05/06/20 0002 05/06/20 0355 05/06/20 0744 05/06/20 1150  GLUCAP 172* 122* 129* 170* 140*   Lipid Profile: No results for input(s): CHOL, HDL, LDLCALC, TRIG, CHOLHDL, LDLDIRECT in the last 72 hours. Thyroid Function Tests: No results for input(s): TSH, T4TOTAL, FREET4, T3FREE, THYROIDAB in the last 72 hours. Anemia Panel: No results for input(s): VITAMINB12, FOLATE, FERRITIN, TIBC, IRON, RETICCTPCT in the last 72 hours. Sepsis Labs: No results for input(s): PROCALCITON, LATICACIDVEN in the last 168 hours.  Recent Results (from the past 240 hour(s))  SARS Coronavirus 2 by RT PCR  (hospital order, performed in [facility] hospital lab) Nasopharyngeal Nasopharyngeal Swab     Status: None   Collection Time: 05/05/20  7:27 AM   Specimen: Nasopharyngeal Swab  Result Value Ref Range Status   SARS Coronavirus 2 NEGATIVE NEGATIVE Final    Comment: (NOTE) SARS-CoV-2 target nucleic acids are NOT DETECTED.  The SARS-CoV-2 RNA is generally detectable in upper and lower respiratory specimens during the acute phase of infection. The lowest concentration of SARS-CoV-2 viral copies this assay can detect is 250 copies / mL. A negative result does not preclude SARS-CoV-2 infection and should not be used as the sole basis for treatment or other patient management decisions.  A negative result may occur with improper specimen collection / handling, submission of specimen other than nasopharyngeal swab, presence of viral mutation(s) within the areas targeted by this assay, and inadequate number of viral copies (<250 copies / mL). A negative result must be combined with clinical observations, patient history, and epidemiological information.  Fact Sheet for Patients:   BoilerBrush.com.cy  Fact Sheet for Healthcare Providers: https://pope.com/  This test is not yet approved or  cleared by the [place] FDA and has been authorized for detection and/or diagnosis of SARS-CoV-2 by FDA under an Emergency Use Authorization (EUA).  This EUA will remain in effect (meaning this test can be used) for the duration of the COVID-19 declaration under Section 564(b)(1) of the Act, 21 U.S.C. section 360bbb-3(b)(1), unless the authorization is terminated or revoked sooner.  Performed at [facility], [address] [place], [place] [address]       Imaging Studies   DG Chest 2 View  Result Date: 05/05/2020 CLINICAL DATA:  44 year old male with shortness of breath. EXAM: CHEST - 2 VIEW COMPARISON:  Chest radiograph dated  03/18/2020. FINDINGS: There is cardiomegaly with vascular congestion. Superimposed pneumonia is not excluded. Overall similar appearance of pulmonary vascular prominence as the prior radiograph. No new consolidative changes. There is no pleural effusion or pneumothorax. No acute osseous pathology. IMPRESSION: No significant interval change in the vascular congestion and pulmonary arterial hypertension. No new consolidation. Electronically Signed   By: Elgie Collard M.D.   On: 05/05/2020 02:21   ECHOCARDIOGRAM COMPLETE  Result Date: 05/05/2020    ECHOCARDIOGRAM REPORT   Patient Name:   Charles Rangel Date of Exam: 05/05/2020 Medical Rec #:  [id number]        Height:       66.0 in Accession #:    [id number]       Weight:       327.0 lb Date of Birth:  27-Dec-1975  BSA:          2.465 m Patient Age:    44 years         BP:           156/109 mmHg Patient Gender: M                HR:           87 bpm. Exam Location:  [facility] Procedure: 2D Echo and Intracardiac Opacification Agent Indications:     Diastolic CHF  History:         Patient has prior history of Echocardiogram examinations. CHF,                  COPD; Risk Factors:Hypertension and Morbid Obesity.  Sonographer:     L Thornton-Maynard Referring Phys:  [id number] Brien Few NIU Diagnosing Phys: Julien Nordmann MD  Sonographer Comments: No apical window, no subcostal window, Technically challenging study due to limited acoustic windows and patient is morbidly obese. Image acquisition challenging due to COPD. IMPRESSIONS  1. Left ventricular ejection fraction, by estimation, is 60 to 65%. The left ventricle has normal function. The left ventricle has no regional wall motion abnormalities. Left ventricular diastolic parameters are indeterminate.  2. Right ventricular systolic function is normal. The right ventricular size is normal. Tricuspid regurgitation signal is inadequate for assessing PA pressure. FINDINGS  Left Ventricle: Left ventricular ejection fraction, by  estimation, is 60 to 65%. The left ventricle has normal function. The left ventricle has no regional wall motion abnormalities. Definity contrast agent was given IV to delineate the left ventricular  endocardial borders. The left ventricular internal cavity size was normal in size. There is no left ventricular hypertrophy. Left ventricular diastolic parameters are indeterminate. Right Ventricle: The right ventricular size is normal. No increase in right ventricular wall thickness. Right ventricular systolic function is normal. Tricuspid regurgitation signal is inadequate for assessing PA pressure. Left Atrium: Left atrial size was normal in size. Right Atrium: Right atrial size was normal in size. Pericardium: There is no evidence of pericardial effusion. Mitral Valve: The mitral valve was not well visualized. Normal mobility of the mitral valve leaflets. No evidence of mitral valve regurgitation. No evidence of mitral valve stenosis. Tricuspid Valve: The tricuspid valve is not well visualized. Tricuspid valve regurgitation is not demonstrated. No evidence of tricuspid stenosis. Aortic Valve: The aortic valve was not well visualized. Aortic valve regurgitation is not visualized. No aortic stenosis is present. Aortic valve mean gradient measures 5.0 mmHg. Aortic valve peak gradient measures 8.2 mmHg. Aortic valve area, by VTI measures 2.97 cm. Pulmonic Valve: The pulmonic valve was normal in structure. Pulmonic valve regurgitation is not visualized. No evidence of pulmonic stenosis. Aorta: The aortic root is normal in size and structure. Venous: The inferior vena cava is normal in size with greater than 50% respiratory variability, suggesting right atrial pressure of 3 mmHg. IAS/Shunts: No atrial level shunt detected by color flow Doppler.  LEFT VENTRICLE PLAX 2D LVIDd:         3.59 cm  Diastology LVIDs:         2.40 cm  LV e' lateral:   9.14 cm/s LV PW:         1.64 cm  LV E/e' lateral: 15.1 LV IVS:        1.78 cm   LV e' medial:    7.13 cm/s LVOT diam:     2.20 cm  LV E/e' medial:  19.4 LV SV:  87 LV SV Index:   35 LVOT Area:     3.80 cm  LEFT ATRIUM         Index LA diam:    4.30 cm 1.74 cm/m  AORTIC VALVE                    PULMONIC VALVE AV Area (Vmax):    3.13 cm     PV Vmax:       1.32 m/s AV Area (Vmean):   2.91 cm     PV Peak grad:  7.0 mmHg AV Area (VTI):     2.97 cm AV Vmax:           143.50 cm/s AV Vmean:          107.150 cm/s AV VTI:            0.292 m AV Peak Grad:      8.2 mmHg AV Mean Grad:      5.0 mmHg LVOT Vmax:         118.00 cm/s LVOT Vmean:        81.900 cm/s LVOT VTI:          0.228 m LVOT/AV VTI ratio: 0.78  AORTA Ao Root diam: 3.20 cm MITRAL VALVE MV Area (PHT): 3.30 cm     SHUNTS MV Decel Time: 230 msec     Systemic VTI:  0.23 m MV E velocity: 138.00 cm/s  Systemic Diam: 2.20 cm MV A velocity: 83.10 cm/s MV E/A ratio:  1.66 Julien Nordmannimothy Gollan MD Electronically signed by Julien Nordmannimothy Gollan MD Signature Date/Time: 05/05/2020/9:46:08 PM    Final      Medications   Scheduled Meds: . allopurinol  100 mg Oral Daily  . aspirin EC  81 mg Oral Daily  . carvedilol  25 mg Oral BID WC  . cholecalciferol  4,000 Units Oral Daily  . colchicine  0.3 mg Oral QODAY  . enoxaparin (LOVENOX) injection  40 mg Subcutaneous Q12H  . [START ON 05/07/2020] furosemide  80 mg Intravenous Q12H  . insulin aspart  0-9 Units Subcutaneous Q4H  . ipratropium-albuterol  3 mL Nebulization Q4H  . multivitamin with minerals  1 tablet Oral Daily  . pantoprazole  40 mg Oral Daily  . sildenafil  20 mg Oral TID  . sodium chloride flush  3 mL Intravenous Q12H  . [START ON 05/07/2020] spironolactone  25 mg Oral Daily   Continuous Infusions: . sodium chloride         LOS: 0 days    Time spent: 36 minutes with greater than 50% spent in coordination of care and direct patient contact    Pennie BanterKelly A Mahayla Haddaway, DO [facility]  05/06/2020, 3:11 PM    If 7PM-7AM, please contact night-coverage. How to contact  the [facility] Attending or Consulting provider 7A - 7P or covering provider during after hours 7P -7A, for this patient?    1. Check the care team in [facility] and look for a) attending/consulting [facility] provider listed and b) the [facility] team listed 2. Log into [email or web address] and use [place]'s universal password to access. If you do not have the password, please contact the hospital operator. 3. Locate the [facility] provider you are looking for under [facility] and page to a number that you can be directly reached. 4. If you still have difficulty reaching the provider, please page the [facility] (Director on Call) for the Hospitalists listed on amion for assistance.

## 2020-05-06 NOTE — Plan of Care (Signed)
  Problem: Education: Goal: Knowledge of General Education information will improve Description Including pain rating scale, medication(s)/side effects and non-pharmacologic comfort measures Outcome: Progressing   

## 2020-05-07 ENCOUNTER — Inpatient Hospital Stay: Payer: No Typology Code available for payment source

## 2020-05-07 ENCOUNTER — Inpatient Hospital Stay: Payer: Self-pay

## 2020-05-07 DIAGNOSIS — J9601 Acute respiratory failure with hypoxia: Secondary | ICD-10-CM

## 2020-05-07 DIAGNOSIS — J9622 Acute and chronic respiratory failure with hypercapnia: Secondary | ICD-10-CM

## 2020-05-07 LAB — BLOOD GAS, ARTERIAL
Acid-Base Excess: 23.3 mmol/L — ABNORMAL HIGH (ref 0.0–2.0)
Acid-Base Excess: 25.7 mmol/L — ABNORMAL HIGH (ref 0.0–2.0)
Bicarbonate: 54.9 mmol/L — ABNORMAL HIGH (ref 20.0–28.0)
Bicarbonate: 53.5 mmol/L — ABNORMAL HIGH (ref 20.0–28.0)
FIO2: 0.32
FIO2: 0.28
MECHVT: 500 mL
Mechanical Rate: 15
O2 Saturation: 96.1 %
O2 Saturation: 92.9 %
PEEP: 5 cmH2O
Patient temperature: 37
Patient temperature: 37
RATE: 15 resp/min
pCO2 arterial: 95 mmHg (ref 32.0–48.0)
pCO2 arterial: 67 mmHg (ref 32.0–48.0)
pH, Arterial: 7.37 (ref 7.350–7.450)
pH, Arterial: 7.51 — ABNORMAL HIGH (ref 7.350–7.450)
pO2, Arterial: 85 mmHg (ref 83.0–108.0)
pO2, Arterial: 60 mmHg — ABNORMAL LOW (ref 83.0–108.0)

## 2020-05-07 LAB — BASIC METABOLIC PANEL
Anion gap: 14 (ref 5–15)
BUN: 20 mg/dL (ref 6–20)
CO2: 34 mmol/L — ABNORMAL HIGH (ref 22–32)
Calcium: 9.9 mg/dL (ref 8.9–10.3)
Chloride: 92 mmol/L — ABNORMAL LOW (ref 98–111)
Creatinine, Ser: 1.32 mg/dL — ABNORMAL HIGH (ref 0.61–1.24)
GFR calc Af Amer: >60 mL/min (ref >60)
GFR calc non Af Amer: >60 mL/min (ref >60)
Glucose, Bld: 115 mg/dL — ABNORMAL HIGH (ref 70–99)
Potassium: 4.8 mmol/L (ref 3.5–5.1)
Sodium: 140 mmol/L (ref 135–145)

## 2020-05-07 LAB — GLUCOSE, CAPILLARY
Glucose-Capillary: 120 mg/dL — ABNORMAL HIGH (ref 70–99)
Glucose-Capillary: 126 mg/dL — ABNORMAL HIGH (ref 70–99)
Glucose-Capillary: 126 mg/dL — ABNORMAL HIGH (ref 70–99)
Glucose-Capillary: 126 mg/dL — ABNORMAL HIGH (ref 70–99)
Glucose-Capillary: 127 mg/dL — ABNORMAL HIGH (ref 70–99)
Glucose-Capillary: 139 mg/dL — ABNORMAL HIGH (ref 70–99)
Glucose-Capillary: 142 mg/dL — ABNORMAL HIGH (ref 70–99)
Glucose-Capillary: 148 mg/dL — ABNORMAL HIGH (ref 70–99)
Glucose-Capillary: 150 mg/dL — ABNORMAL HIGH (ref 70–99)

## 2020-05-07 LAB — MRSA PCR SCREENING: MRSA by PCR: NEGATIVE

## 2020-05-07 LAB — MAGNESIUM: Magnesium: 2.3 mg/dL (ref 1.7–2.4)

## 2020-05-07 LAB — TRIGLYCERIDES: Triglycerides: 70 mg/dL (ref <150)

## 2020-05-07 MED ORDER — SODIUM CHLORIDE 0.9% FLUSH
10.0000 mL | Freq: Two times a day (BID) | INTRAVENOUS | Status: DC
  Administered 2020-05-07 – 2020-05-08 (×3): 10 mL

## 2020-05-07 MED ORDER — TRAMADOL HCL 50 MG PO TABS
50.0000 mg | ORAL_TABLET | Freq: Four times a day (QID) | ORAL | Status: DC | PRN
  Administered 2020-05-07: 50 mg via ORAL
  Filled 2020-05-07: qty 1

## 2020-05-07 MED ORDER — VECURONIUM BROMIDE 10 MG IV SOLR: 10.0000 mg | Freq: Once | INTRAVENOUS | Status: AC

## 2020-05-07 MED ORDER — PROPOFOL 1000 MG/100ML IV EMUL
5.0000 ug/kg/min | INTRAVENOUS | Status: DC
  Administered 2020-05-07 (×3): 30 ug/kg/min via INTRAVENOUS
  Administered 2020-05-07: 10 ug/kg/min via INTRAVENOUS
  Administered 2020-05-08: 30 ug/kg/min via INTRAVENOUS
  Administered 2020-05-08 (×2): 40 ug/kg/min via INTRAVENOUS
  Administered 2020-05-08: 30 ug/kg/min via INTRAVENOUS
  Administered 2020-05-08: 40 ug/kg/min via INTRAVENOUS
  Administered 2020-05-08: 30 ug/kg/min via INTRAVENOUS
  Administered 2020-05-09: 40 ug/kg/min via INTRAVENOUS
  Filled 2020-05-07 (×12): qty 100

## 2020-05-07 MED ORDER — FENTANYL 2500MCG IN NS 250ML (10MCG/ML) PREMIX INFUSION
0.0000 ug/h | INTRAVENOUS | Status: DC
  Administered 2020-05-07: 50 ug/h via INTRAVENOUS
  Administered 2020-05-07 – 2020-05-08 (×3): 250 ug/h via INTRAVENOUS
  Filled 2020-05-07 (×4): qty 250

## 2020-05-07 MED ORDER — BUDESONIDE 0.5 MG/2ML IN SUSP
0.5000 mg | Freq: Two times a day (BID) | RESPIRATORY_TRACT | Status: DC
  Administered 2020-05-07 – 2020-05-08 (×4): 0.5 mg via RESPIRATORY_TRACT
  Filled 2020-05-07 (×4): qty 2

## 2020-05-07 MED ORDER — FUROSEMIDE 10 MG/ML IJ SOLN
80.0000 mg | Freq: Two times a day (BID) | INTRAMUSCULAR | Status: DC
  Administered 2020-05-07 – 2020-05-08 (×2): 80 mg via INTRAVENOUS
  Filled 2020-05-07 (×2): qty 8

## 2020-05-07 MED ORDER — MIDAZOLAM HCL 2 MG/2ML IJ SOLN
INTRAMUSCULAR | Status: AC
  Administered 2020-05-07: 4 mg via INTRAVENOUS
  Filled 2020-05-07: qty 4

## 2020-05-07 MED ORDER — FENTANYL CITRATE (PF) 100 MCG/2ML IJ SOLN
INTRAMUSCULAR | Status: AC
  Administered 2020-05-07: 100 ug via INTRAVENOUS
  Filled 2020-05-07: qty 2

## 2020-05-07 MED ORDER — FENTANYL CITRATE (PF) 100 MCG/2ML IJ SOLN: 100.0000 ug | Freq: Once | INTRAMUSCULAR | Status: AC

## 2020-05-07 MED ORDER — NOREPINEPHRINE 4 MG/250ML-% IV SOLN
0.0000 ug/min | INTRAVENOUS | Status: DC
  Administered 2020-05-07: 2 ug/min via INTRAVENOUS
  Administered 2020-05-08: 18 ug/min via INTRAVENOUS
  Administered 2020-05-08 (×2): 12 ug/min via INTRAVENOUS
  Administered 2020-05-08: 8 ug/min via INTRAVENOUS
  Administered 2020-05-08: 20 ug/min via INTRAVENOUS
  Filled 2020-05-07 (×7): qty 250

## 2020-05-07 MED ORDER — SODIUM CHLORIDE 0.9% FLUSH
10.0000 mL | INTRAVENOUS | Status: DC | PRN
  Administered 2020-05-07: 20 mL

## 2020-05-07 MED ORDER — CHLORHEXIDINE GLUCONATE 0.12% ORAL RINSE (MEDLINE KIT)
15.0000 mL | Freq: Two times a day (BID) | OROMUCOSAL | Status: DC
  Administered 2020-05-07 – 2020-05-08 (×4): 15 mL via OROMUCOSAL

## 2020-05-07 MED ORDER — MIDAZOLAM HCL 2 MG/2ML IJ SOLN: 4.0000 mg | Freq: Once | INTRAMUSCULAR | Status: AC

## 2020-05-07 MED ORDER — CARVEDILOL 6.25 MG PO TABS
6.2500 mg | ORAL_TABLET | Freq: Two times a day (BID) | ORAL | Status: DC
  Administered 2020-05-08: 6.25 mg via ORAL
  Filled 2020-05-07: qty 1

## 2020-05-07 MED ORDER — SODIUM CHLORIDE 0.9 % IV SOLN
3.0000 g | Freq: Four times a day (QID) | INTRAVENOUS | Status: DC
  Administered 2020-05-07 – 2020-05-08 (×6): 3 g via INTRAVENOUS
  Filled 2020-05-07 (×2): qty 3
  Filled 2020-05-07 (×4): qty 8
  Filled 2020-05-07 (×3): qty 3

## 2020-05-07 MED ORDER — PROMETHAZINE HCL 25 MG/ML IJ SOLN
12.5000 mg | Freq: Four times a day (QID) | INTRAMUSCULAR | Status: DC | PRN
  Administered 2020-05-07 (×2): 12.5 mg via INTRAVENOUS
  Filled 2020-05-07 (×2): qty 1

## 2020-05-07 MED ORDER — VECURONIUM BROMIDE 10 MG IV SOLR
INTRAVENOUS | Status: AC
  Administered 2020-05-07: 10 mg via INTRAVENOUS
  Filled 2020-05-07: qty 10

## 2020-05-07 MED ORDER — METOCLOPRAMIDE HCL 5 MG/ML IJ SOLN
10.0000 mg | Freq: Once | INTRAMUSCULAR | Status: AC
  Administered 2020-05-07: 10 mg via INTRAVENOUS
  Filled 2020-05-07: qty 2

## 2020-05-07 MED ORDER — CHLORHEXIDINE GLUCONATE CLOTH 2 % EX PADS
6.0000 | MEDICATED_PAD | Freq: Every day | CUTANEOUS | Status: DC
  Administered 2020-05-07 – 2020-05-08 (×2): 6 via TOPICAL

## 2020-05-07 MED ORDER — ORAL CARE MOUTH RINSE
15.0000 mL | OROMUCOSAL | Status: DC
  Administered 2020-05-07 – 2020-05-08 (×14): 15 mL via OROMUCOSAL

## 2020-05-07 NOTE — Progress Notes (Signed)
At approximately 2345, pt vomited suddenly into his bipap mask, he sat up and did not aspirate.  Mask discarded and O2 placed at 4 liters [place].  Zofran 4mg  given IV and later, phenergan 12.5mg  given as well.  Pt now lying in bed, denies nausea and has new bipap mask on.  O2 sat is 98%.

## 2020-05-07 NOTE — Progress Notes (Signed)
Patient arrived to unit from 2A on bipap, lethargic but will arouse to voice. Upon arrival to unit, decision made by Dr. Belia Heman to intubate patient. Patient intubated by respiratory team; OG and foley placed. IV team contacted for PICC line.

## 2020-05-07 NOTE — Procedures (Signed)
Endotracheal Intubation: Patient required placement of an artificial airway secondary to Respiratory Failure  Consent: Emergent.   Hand washing performed prior to starting the procedure.   Medications administered for sedation prior to procedure:  Midazolam 4 mg IV,  Vecuronium 10 mg IV, Fentanyl 200 mcg IV.    A time out procedure was called and correct patient, name, & ID confirmed. Needed supplies and equipment were assembled and checked to include ETT, 10 ml syringe, Glidescope, Mac and Miller blades, suction, oxygen and bag mask valve, end tidal CO2 monitor.   Patient was positioned to align the mouth and pharynx to facilitate visualization of the glottis.   Heart rate, SpO2 and blood pressure was continuously monitored during the procedure. Pre-oxygenation was conducted prior to intubation and endotracheal tube was placed through the vocal cords into the trachea.     The artificial airway was placed under direct visualization via glidescope route using a 8.0 ETT on the first attempt.  ETT was secured at 23 cm mark.  Placement was confirmed by auscuitation of lungs with good breath sounds bilaterally and no stomach sounds.  Condensation was noted on endotracheal tube.   Pulse ox 98%.  CO2 detector in place with appropriate color change.   Complications: None .   Operator: Natonya Finstad.   Chest radiograph ordered and pending.   Comments: OGT placed via glidescope.  Charles Rangel, M.D.  [facility]  Medical Director ICU-[facility] [place] Medical Director [facility] Cardio-Pulmonary Department       

## 2020-05-07 NOTE — Progress Notes (Signed)
Pt resting quietly with eyes closed.  Resp even and unlabored.  O2 at 5 liters [place]. O2 sat 98%.   Pt in recliner.  No observed needs at this time.

## 2020-05-07 NOTE — Progress Notes (Signed)
Pt vomited again into bipap mask, no aspiration noted.  Pt cleansed and linens/gown changed.  NP notified.  Reglan 10 mg given IV.  Pt sitting up in recliner at this time.  Pt instructed not to eat or drink anything.  Pt verbalized understand.O2 at 5 liters [place].

## 2020-05-07 NOTE — Progress Notes (Signed)
Peripherally Inserted Central Catheter Placement  The IV Nurse has discussed with the patient and/or persons authorized to consent for the patient, the purpose of this procedure and the potential benefits and risks involved with this procedure.  The benefits include less needle sticks, lab draws from the catheter, and the patient may be discharged home with the catheter. Risks include, but not limited to, infection, bleeding, blood clot (thrombus formation), and puncture of an artery; nerve damage and irregular heartbeat and possibility to perform a PICC exchange if needed/ordered by physician.  Alternatives to this procedure were also discussed.  Bard Power PICC patient education guide, fact sheet on infection prevention and patient information card has been provided to patient /or left at bedside.    PICC Placement Documentation  PICC Double Lumen 05/07/20 PICC Right Brachial 42 cm 0 cm (Active)  Indication for Insertion or Continuance of Line Prolonged intravenous therapies 05/07/20 1509  Exposed Catheter (cm) 0 cm 05/07/20 1509  Site Assessment Clean;Dry;Intact 05/07/20 1509  Lumen #1 Status Flushed;Blood return noted;Saline locked 05/07/20 1509  Lumen #2 Status Flushed;Blood return noted;Saline locked 05/07/20 1509  Dressing Type Transparent 05/07/20 1509  Dressing Status Clean;Dry;Intact;Antimicrobial disc in place 05/07/20 1509  Dressing Change Due 05/14/20 05/07/20 1509       Audrie Gallus 05/07/2020, 3:11 PM

## 2020-05-07 NOTE — Progress Notes (Signed)
Replaced Bipap mask that pt vomited into & placed pt back on Bipap without incident.

## 2020-05-07 NOTE — Progress Notes (Signed)
Spoke with wife to give her update of patient condition. Wife is requesting that Dr. Belia Heman call her after 3 when she gets off work.

## 2020-05-07 NOTE — Plan of Care (Signed)
Patient intubated using glydoscope #4 blade with 8.0 ET tube in the first attempt. Position verified by color change on end tidal co2 minitor and bilateral breath sounds. Tube secured at 24 cm at lip mark

## 2020-05-07 NOTE — Progress Notes (Signed)
PROGRESS NOTE    Charles Rangel   VHQ:[id number]  DOB: 29-Mar-1976  PCP: [facility]    DOA: 05/05/2020 LOS: 1   Brief Narrative   Charles Rangel is a 44 y.o. male with medical history of hypertension, diabetes mellitus, COPD, GERD, gout, OSA, remote DVT provoked by prolonged travel, CHF, pulmonary hypertension, CKD stage II, morbid obesity who presented to the ED on 05/05/20 with progressively worsening shortness of breath for a week, worsening lower extremity edema and 10 pound weight gain.  In the ED, afebrile, BP 142/87, tachypneic, O2 sat 87% on room air, improved to 100% on 3 L/min supplemental oxygen.  Labs notable for normal troponin x 2, BNP 46 (likely false low due to obesity), renal function near baseline, K 3.3.  Chest xray showed vascular congestion without infiltrates.   Admitted to hospitalist service.  Undergoing IV diuresis.  Cardiology consulted.      Assessment & Plan   Principal Problem:   Acute on chronic diastolic CHF (congestive heart failure) (HCC) Active Problems:   Pulmonary hypertension, unspecified (HCC)   HTN (hypertension)   CKD stage 2 due to type 2 diabetes mellitus (HCC)   Gout   GERD (gastroesophageal reflux disease)   Acute on chronic respiratory failure with hypoxia (HCC)   Hypokalemia   Patient with nausea/vomiting into bipap mask overnight and this morning, no apparent aspiration, but we are unable to use bipap for ventilation.  He remains significantly hypercarbic and lethargic.  ABG this AM showed pCO2 of 95.  Transferred to ICU and subsequently intubated.  Patient's care assumed by PCCM.   Acute on chronic respiratory failure with hypoxia and hypercapnea secondary to Acute on chronic diastolic CHF -present on admission with tense lower extremity edema, 10 pound weight gain, vascular congestion on chest x-ray all consistent with CHF decompensation.  Echo obtained and showed EF 60 to 65% with indeterminate diastolic  parameters.  Cardiology consulted.  Monitor volume status closely with strict I/O's and daily weights.  Low-sodium diet with fluid restriction.  Continue Coreg and spironolactone.  Lasix 80 mg IV BID.  Diuresis per cardiology.  Nausea & Vomiting - not present on admission.  Overnight, pt had couple episodes of emesis while on bipap.  No signs of aspiration.  PRN antiemetics.    Acute metabolic encephalopathy secondary to  Chronic? Hypercapneic respiratory failure - worsening.  Suspect chronic, secondary to obesity hypoventilation syndrome.  PCO2 on ABG 7/18 was 68, today 7/19 pCO2 95.  He was very drowsy and falling asleep during encounter this AM.  Was started on bipap but due to N/V unable to continue.  Transfer to ICU.  Intubated.  Further mgmt per PCCM.  Right leg pain - with swelling greater on right than left, some tenderness at proximal calf.  Right LE doppler U/S negative  for DVT.    Hypokalemia - POA with K 3.3, replaced.  Monitor and replace as needed.  CKD stage 2 - stable. Presented with Cr 1.53, baseline creatinine 1.2-1.5 recently.  Monitor BMP.  Avoid nephrotoxins and hypotension.    Essential hypertension -, stable.  Home regimen appears to be Coreg, spironolactone and torsemide.  Currently on IV Lasix with torsemide held.  Continue Coreg and spironolactone.  As needed hydralazine.  Gout - continue allopurinol and Colchicine  Severe Pulmonary hypertension - Continue sildenafil.  Contributes to hypoxic respiratory failure.  GERD - continue Protonix  Morbid Obesity: Body mass index is 53.49 kg/m. Complicates overall care and prognosis including respiratory  status given thoracic restriction.    DVT prophylaxis: enoxaparin (LOVENOX) injection 40 mg Start: 05/05/20 1015   Diet:  Diet Orders (From admission, onward)    Start     Ordered   05/05/20 0958  Diet heart healthy/carb modified Room service appropriate? Yes; Fluid consistency: Thin; Fluid restriction: 1500 mL  Fluid  Diet effective now       Question Answer Comment  Diet-HS Snack? Nothing   Room service appropriate? Yes   Fluid consistency: Thin   Fluid restriction: 1500 mL Fluid      05/05/20 0957            Code Status: Full Code    Subjective 05/07/20    Patient seen up in chair this morning.  Overnight, he vomited twice while on bipap, apparently did not aspirate.  He continues to be very lethargic, can't keep his eyes open during encounter.  He had a fall while up to bathroom, landed on his right knee and hand, didn't go all the way down.  Denies any pain or injury.     Disposition Plan & Communication   Status is: Inpatient  Remains inpatient appropriate because:IV treatments appropriate due to intensity of illness or inability to take PO.  Intubated.   Dispo:  Patient From: Home  Planned Disposition: Home  Expected discharge date: > 3 days  Medically stable for discharge: No    Family Communication: None at bedside, will attempt to call   Consults, Procedures, Significant Events   Consultants:   Cardiology  Procedures:   Echo 7/17 -EF 60 to 65%, indeterminate diastolic parameters  Antimicrobials:   None   Objective   Vitals:   05/07/20 0152 05/07/20 0329 05/07/20 0753 05/07/20 0757  BP: (!) 158/89 (!) 152/87  132/73  Pulse: 95 90  91  Resp: Temp:  98.3 F (36.8 C)  98.5 F (36.9 C)  TempSrc:      SpO2: 99% 100% 99% 100%  Weight:      Height:        Intake/Output Summary (Last 24 hours) at 05/07/2020 0826 Last data filed at 05/07/2020 0520 Gross per 24 hour  Intake 723 ml  Output 3350 ml  Net -2627 ml   Filed Weights   05/05/20 0111 05/05/20 1400 05/06/20 0351  Weight: (!) 148.3 kg (!) 150.6 kg (!) 150.3 kg    Physical Exam:  General exam: lethargic, no acute distress, morbidly obese HEENT: moist mucus membranes, hearing grossly normal  Respiratory system: Diminished due to body habitus unable to appreciate crackles,  wheezes, or rhonchi, normal respiratory effort. Cardiovascular system: normal S1/S2, RRR, tense lower extremity edema L>R.   Central nervous system: A&O x3. no gross focal neurologic deficits, normal speech Extremities: moves all, BLE with tense edema L slightly more than R Psychiatry: normal mood, congruent affect, judgement and insight appear normal  Labs   Data Reviewed: I have personally reviewed following labs and imaging studies  CBC: Recent Labs  Lab 05/05/20 0119 05/06/20 0602  WBC 8.6 8.2  HGB 13.2 14.0  HCT 44.0 45.3  MCV 79.7* 80.0  PLT 284 267   Basic Metabolic Panel: Recent Labs  Lab 05/05/20 0119 05/06/20 0602 05/07/20 0324  NA 138 143 140  K 3.3* 4.2 4.8  CL 92* 96* 92*  CO2 32 34* 34*  GLUCOSE 129* 141* 115*  BUN 19 21* 20  CREATININE 1.53* 1.20 1.32*  CALCIUM 9.7 10.3 9.9  MG  --  2.3 2.3   GFR: Estimated Creatinine Clearance: 98.4 mL/min (A) (by C-G formula based on SCr of 1.32 mg/dL (H)). Liver Function Tests: No results for input(s): AST, ALT, ALKPHOS, BILITOT, PROT, ALBUMIN in the last 168 hours. No results for input(s): LIPASE, AMYLASE in the last 168 hours. No results for input(s): AMMONIA in the last 168 hours. Coagulation Profile: No results for input(s): INR, PROTIME in the last 168 hours. Cardiac Enzymes: No results for input(s): CKTOTAL, CKMB, CKMBINDEX, TROPONINI in the last 168 hours. BNP (last 3 results) No results for input(s): PROBNP in the last 8760 hours. HbA1C: No results for input(s): HGBA1C in the last 72 hours. CBG: Recent Labs  Lab 05/06/20 1615 05/06/20 1954 05/07/20 0024 05/07/20 0522 05/07/20 0753  GLUCAP 161* 169* 127* 142* 139*   Lipid Profile: No results for input(s): CHOL, HDL, LDLCALC, TRIG, CHOLHDL, LDLDIRECT in the last 72 hours. Thyroid Function Tests: No results for input(s): TSH, T4TOTAL, FREET4, T3FREE, THYROIDAB in the last 72 hours. Anemia Panel: No results for input(s): VITAMINB12, FOLATE,  FERRITIN, TIBC, IRON, RETICCTPCT in the last 72 hours. Sepsis Labs: No results for input(s): PROCALCITON, LATICACIDVEN in the last 168 hours.  Recent Results (from the past 240 hour(s))  SARS Coronavirus 2 by RT PCR (hospital order, performed in [facility] hospital lab) Nasopharyngeal Nasopharyngeal Swab     Status: None   Collection Time: 05/05/20  7:27 AM   Specimen: Nasopharyngeal Swab  Result Value Ref Range Status   SARS Coronavirus 2 NEGATIVE NEGATIVE Final    Comment: (NOTE) SARS-CoV-2 target nucleic acids are NOT DETECTED.  The SARS-CoV-2 RNA is generally detectable in upper and lower respiratory specimens during the acute phase of infection. The lowest concentration of SARS-CoV-2 viral copies this assay can detect is 250 copies / mL. A negative result does not preclude SARS-CoV-2 infection and should not be used as the sole basis for treatment or other patient management decisions.  A negative result may occur with improper specimen collection / handling, submission of specimen other than nasopharyngeal swab, presence of viral mutation(s) within the areas targeted by this assay, and inadequate number of viral copies (<250 copies / mL). A negative result must be combined with clinical observations, patient history, and epidemiological information.  Fact Sheet for Patients:   BoilerBrush.com.cy  Fact Sheet for Healthcare Providers: https://pope.com/  This test is not yet approved or  cleared by the [place] FDA and has been authorized for detection and/or diagnosis of SARS-CoV-2 by FDA under an Emergency Use Authorization (EUA).  This EUA will remain in effect (meaning this test can be used) for the duration of the COVID-19 declaration under Section 564(b)(1) of the Act, 21 U.S.C. section 360bbb-3(b)(1), unless the authorization is terminated or revoked sooner.  Performed at [facility], [address] [place], [place] [address]       Imaging Studies   US Venous Img Lower Unilateral Left (DVT)  Result Date: 05/06/2020 CLINICAL DATA:  Left leg pain and swelling EXAM: LEFT LOWER EXTREMITY VENOUS DOPPLER ULTRASOUND TECHNIQUE: Gray-scale sonography with compression, as well as color and duplex ultrasound, were performed to evaluate the deep venous system(s) from the level of the common femoral vein through the popliteal and proximal calf veins. COMPARISON:  None. FINDINGS: VENOUS Normal compressibility of the common femoral, superficial femoral, and popliteal veins, as well as the visualized calf veins. Visualized portions of profunda femoral vein and great saphenous vein unremarkable. No filling defects to suggest DVT on grayscale or  color Doppler imaging. Doppler waveforms show normal direction of venous flow, normal respiratory plasticity and response to augmentation. Limited views of the contralateral common femoral vein are unremarkable. OTHER None. Limitations: none IMPRESSION: Negative. Electronically Signed   By: Katherine Mantle M.D.   On: 05/06/2020 18:42   ECHOCARDIOGRAM COMPLETE  Result Date: 05/05/2020    ECHOCARDIOGRAM REPORT   Patient Name:   Charles Rangel Date of Exam: 05/05/2020 Medical Rec #:  [id number]        Height:       66.0 in Accession #:    [id number]       Weight:       327.0 lb Date of Birth:  September 20, 1976        BSA:          2.465 m Patient Age:    44 years         BP:           156/109 mmHg Patient Gender: M                HR:           87 bpm. Exam Location:  [facility] Procedure: 2D Echo and Intracardiac Opacification Agent Indications:     Diastolic CHF  History:         Patient has prior history of Echocardiogram examinations. CHF,                  COPD; Risk Factors:Hypertension and Morbid Obesity.  Sonographer:     L Thornton-Maynard Referring Phys:  [id number] Brien Few NIU Diagnosing Phys: Julien Nordmann MD  Sonographer Comments: No apical window, no subcostal window, Technically  challenging study due to limited acoustic windows and patient is morbidly obese. Image acquisition challenging due to COPD. IMPRESSIONS  1. Left ventricular ejection fraction, by estimation, is 60 to 65%. The left ventricle has normal function. The left ventricle has no regional wall motion abnormalities. Left ventricular diastolic parameters are indeterminate.  2. Right ventricular systolic function is normal. The right ventricular size is normal. Tricuspid regurgitation signal is inadequate for assessing PA pressure. FINDINGS  Left Ventricle: Left ventricular ejection fraction, by estimation, is 60 to 65%. The left ventricle has normal function. The left ventricle has no regional wall motion abnormalities. Definity contrast agent was given IV to delineate the left ventricular  endocardial borders. The left ventricular internal cavity size was normal in size. There is no left ventricular hypertrophy. Left ventricular diastolic parameters are indeterminate. Right Ventricle: The right ventricular size is normal. No increase in right ventricular wall thickness. Right ventricular systolic function is normal. Tricuspid regurgitation signal is inadequate for assessing PA pressure. Left Atrium: Left atrial size was normal in size. Right Atrium: Right atrial size was normal in size. Pericardium: There is no evidence of pericardial effusion. Mitral Valve: The mitral valve was not well visualized. Normal mobility of the mitral valve leaflets. No evidence of mitral valve regurgitation. No evidence of mitral valve stenosis. Tricuspid Valve: The tricuspid valve is not well visualized. Tricuspid valve regurgitation is not demonstrated. No evidence of tricuspid stenosis. Aortic Valve: The aortic valve was not well visualized. Aortic valve regurgitation is not visualized. No aortic stenosis is present. Aortic valve mean gradient measures 5.0 mmHg. Aortic valve peak gradient measures 8.2 mmHg. Aortic valve area, by VTI measures  2.97 cm. Pulmonic Valve: The pulmonic valve was normal in structure. Pulmonic valve regurgitation is not visualized. No evidence of pulmonic stenosis. Aorta: The aortic root is normal  in size and structure. Venous: The inferior vena cava is normal in size with greater than 50% respiratory variability, suggesting right atrial pressure of 3 mmHg. IAS/Shunts: No atrial level shunt detected by color flow Doppler.  LEFT VENTRICLE PLAX 2D LVIDd:         3.59 cm  Diastology LVIDs:         2.40 cm  LV e' lateral:   9.14 cm/s LV PW:         1.64 cm  LV E/e' lateral: 15.1 LV IVS:        1.78 cm  LV e' medial:    7.13 cm/s LVOT diam:     2.20 cm  LV E/e' medial:  19.4 LV SV:         87 LV SV Index:   35 LVOT Area:     3.80 cm  LEFT ATRIUM         Index LA diam:    4.30 cm 1.74 cm/m  AORTIC VALVE                    PULMONIC VALVE AV Area (Vmax):    3.13 cm     PV Vmax:       1.32 m/s AV Area (Vmean):   2.91 cm     PV Peak grad:  7.0 mmHg AV Area (VTI):     2.97 cm AV Vmax:           143.50 cm/s AV Vmean:          107.150 cm/s AV VTI:            0.292 m AV Peak Grad:      8.2 mmHg AV Mean Grad:      5.0 mmHg LVOT Vmax:         118.00 cm/s LVOT Vmean:        81.900 cm/s LVOT VTI:          0.228 m LVOT/AV VTI ratio: 0.78  AORTA Ao Root diam: 3.20 cm MITRAL VALVE MV Area (PHT): 3.30 cm     SHUNTS MV Decel Time: 230 msec     Systemic VTI:  0.23 m MV E velocity: 138.00 cm/s  Systemic Diam: 2.20 cm MV A velocity: 83.10 cm/s MV E/A ratio:  1.66 Julien Nordmannimothy Gollan MD Electronically signed by Julien Nordmannimothy Gollan MD Signature Date/Time: 05/05/2020/9:46:08 PM    Final      Medications   Scheduled Meds: . allopurinol  100 mg Oral Daily  . aspirin EC  81 mg Oral Daily  . carvedilol  25 mg Oral BID WC  . cholecalciferol  4,000 Units Oral Daily  . colchicine  0.3 mg Oral QODAY  . enoxaparin (LOVENOX) injection  40 mg Subcutaneous Q12H  . furosemide  80 mg Intravenous Q12H  . insulin aspart  0-9 Units Subcutaneous Q4H  .  ipratropium-albuterol  3 mL Nebulization Q4H  . multivitamin with minerals  1 tablet Oral Daily  . pantoprazole  40 mg Oral Daily  . sildenafil  20 mg Oral TID  . sodium chloride flush  3 mL Intravenous Q12H  . spironolactone  25 mg Oral Daily   Continuous Infusions: . sodium chloride         LOS: 1 day    Time spent: 40 minutes with greater than 50% spent in coordination of care and direct patient contact    Pennie BanterKelly A Arsalan Brisbin, DO [facility]  05/07/2020, 8:26 AM    If  7PM-7AM, please contact night-coverage. How to contact the [facility] Attending or Consulting provider 7A - 7P or covering provider during after hours 7P -7A, for this patient?    1. Check the care team in [facility] and look for a) attending/consulting [facility] provider listed and b) the [facility] team listed 2. Log into [email or web address] and use [facility] universal password to access. If you do not have the password, please contact the hospital operator. 3. Locate the [facility] provider you are looking for under [facility] and page to a number that you can be directly reached. 4. If you still have difficulty reaching the provider, please page the [facility] (Director on Call) for the Hospitalists listed on amion for assistance.

## 2020-05-07 NOTE — Consult Note (Signed)
Name: Charles Rangel MRN: [id number] DOB: 08-May-1976     CONSULTATION DATE: 05/05/2020  REFERRING MD : Denton Lank  CHIEF COMPLAINT: resp distress  PATIENT SYNOPSIS/PROFILE 44 y.o. male with a hx of CHF who admitted for  the evaluation of acute on chronic diastolic heart failure SOB and has dx of PULM HTN    HISTORY OF PRESENT ILLNESS:   44 year old male with multiple comorbidities, hospitalized for acute on chronic heart failure.   Medical problems include morbid obesity with BMI of 53 type 2 diabetes obstructive sleep apnea, and gout.    The patient has had multiple hospitalizations for acute on chronic heart failure requiring IV diuretic therapy.     most recently he was hospitalized January 17, 2020.   During that admission he underwent right heart catheterization which demonstrated findings consistent with moderately elevated left heart filling pressures, severely elevated PA pressure and right heart pressures, and preserved cardiac output.   He was started on empiric sildenafil and continued on his outpatient diuretic regimen but transition from furosemide to torsemide on discharge.   7/19 Patient transferred to ICU for increased WOB and SOB Placed on bIPAP,+vomiting and aspirration pneumonia  7/19 emergently intubated, placed on vent     PAST MEDICAL HISTORY :   has a past medical history of (HFpEF) heart failure with preserved ejection fraction (HCC), CHF (congestive heart failure) (HCC), COPD (chronic obstructive pulmonary disease) (HCC), Diabetes mellitus without complication (HCC), DVT (deep venous thrombosis) (HCC), Gout, Hypertension, Morbid obesity (HCC), and OSA (obstructive sleep apnea).  has a past surgical history that includes Left leg surgery for fracture and RIGHT HEART CATH (N/A, 03/20/2020). Prior to Admission medications   Medication Sig Start Date End Date Taking? Authorizing Provider  albuterol (VENTOLIN HFA) 108 (90 Base) MCG/ACT inhaler Inhale 2  puffs into the lungs every 6 (six) hours as needed for wheezing or shortness of breath. 08/27/19  Yes Chesley Noon, MD  allopurinol (ZYLOPRIM) 100 MG tablet Take 1 tablet (100 mg total) by mouth daily. 11/18/18  Yes Lule, Joana, PA  aspirin EC 81 MG tablet Take 81 mg daily by mouth.   Yes [provider]  carvedilol (COREG) 25 MG tablet Take 1 tablet (25 mg total) by mouth 2 (two) times daily with a meal. 12/25/19 05/05/20 Yes Darlin Priestly, MD  Cholecalciferol (VITAMIN D3) 1000 units CAPS Take 4,000 Units by mouth daily.    Yes [provider]  colchicine 0.6 MG tablet Take 0.5 tablets (0.3 mg total) by mouth every other day. 12/25/19  Yes Darlin Priestly, MD  metFORMIN (GLUCOPHAGE) 500 MG tablet Take 1,000 mg by mouth 2 (two) times daily with a meal.    Yes [provider]  Multiple Vitamin (MULTIVITAMIN WITH MINERALS) TABS tablet Take 1 tablet by mouth daily.   Yes [provider]  omeprazole (PRILOSEC) 20 MG capsule Take 1 capsule (20 mg total) by mouth daily. 04/09/20  Yes Alver Sorrow, NP  sildenafil (REVATIO) 20 MG tablet TAKE 1 TABLET BY MOUTH THREE TIMES A DAY 05/02/20  Yes Alver Sorrow, NP  spironolactone (ALDACTONE) 25 MG tablet Take 0.5 tablets (12.5 mg total) by mouth daily. 04/09/20 10/06/20 Yes Alver Sorrow, NP  torsemide (DEMADEX) 20 MG tablet TAKE 2 TABLETS (40 MG TOTAL) BY MOUTH 2 (TWO) TIMES DAILY. 04/17/20  Yes Alver Sorrow, NP   Allergies  Allergen Reactions  . Tylenol [Acetaminophen] Anaphylaxis  . Dog Epithelium Allergy Skin Test Hives  . Apple Hives  .  Other Hives    RAISINS    FAMILY HISTORY:  family history includes CVA (age of onset: 58) in his father; Hypertension in his father and mother. SOCIAL HISTORY:  reports that he quit smoking about 9 years ago. He has a 20.00 pack-year smoking history. He has never used smokeless tobacco. He reports previous alcohol use. He reports that he does not use drugs.  REVIEW OF SYSTEMS:    Unable to obtain due to critical illness      Estimated body mass index is 53.49 kg/m as calculated from the following:   Height as of this encounter: 5\' 6"  (1.676 m).   Weight as of this encounter: 150.3 kg.    VITAL SIGNS: Temp:  [98 F (36.7 C)-98.7 F (37.1 C)] 98.6 F (37 C) (07/19 1300) Pulse Rate:  [87-99] 89 (07/19 1122) Resp:  [14-20] 20 (07/19 1122) BP: (128-158)/(73-89) 135/79 (07/19 1122) SpO2:  [98 %-100 %] 99 % (07/19 1122) FiO2 (%):  [28 %-35 %] 28 % (07/19 1300)   I/O last 3 completed shifts: In: 726 [P.O.:720; I.V.:6] Out: 5450 [Urine:4750; Emesis/NG output:700] Total I/O In: 123 [P.O.:120; I.V.:3] Out: 200 [Urine:200]   SpO2: 99 % O2 Flow Rate (L/min): 3 L/min FiO2 (%): 28 %   Physical Examination:  GENERAL:critically ill appearing, +resp distress HEAD: Normocephalic, atraumatic.  EYES: Pupils equal, round, reactive to light.  No scleral icterus.  MOUTH: Moist mucosal membrane. NECK: Supple. No JVD.  PULMONARY: +rhonchi, +wheezing CARDIOVASCULAR: S1 and S2. Regular rate and rhythm. No murmurs, rubs, or gallops.  GASTROINTESTINAL: Soft, nontender, +distended.  Positive bowel sounds.  MUSCULOSKELETAL: + edema.  NEUROLOGIC: obtunded SKIN:intact,warm,dry   MEDICATIONS: I have reviewed all medications and confirmed regimen as documented   CULTURE RESULTS   Recent Results (from the past 240 hour(s))  SARS Coronavirus 2 by RT PCR (hospital order, performed in [facility] hospital lab) Nasopharyngeal Nasopharyngeal Swab     Status: None   Collection Time: 05/05/20  7:27 AM   Specimen: Nasopharyngeal Swab  Result Value Ref Range Status   SARS Coronavirus 2 NEGATIVE NEGATIVE Final    Comment: (NOTE) SARS-CoV-2 target nucleic acids are NOT DETECTED.  The SARS-CoV-2 RNA is generally detectable in upper and lower respiratory specimens during the acute phase of infection. The lowest concentration of SARS-CoV-2 viral copies this assay can detect  is 250 copies / mL. A negative result does not preclude SARS-CoV-2 infection and should not be used as the sole basis for treatment or other patient management decisions.  A negative result may occur with improper specimen collection / handling, submission of specimen other than nasopharyngeal swab, presence of viral mutation(s) within the areas targeted by this assay, and inadequate number of viral copies (<250 copies / mL). A negative result must be combined with clinical observations, patient history, and epidemiological information.  Fact Sheet for Patients:   05/07/20  Fact Sheet for Healthcare Providers: BoilerBrush.com.cy  This test is not yet approved or  cleared by the https://pope.com/ FDA and has been authorized for detection and/or diagnosis of SARS-CoV-2 by FDA under an Emergency Use Authorization (EUA).  This EUA will remain in effect (meaning this test can be used) for the duration of the COVID-19 declaration under Section 564(b)(1) of the Act, 21 U.S.C. section 360bbb-3(b)(1), unless the authorization is terminated or revoked sooner.  Performed at [facility], [address], [place], [place]           IMAGING    [id number]  Venous Img Lower Unilateral Left (DVT)  Result Date: 05/06/2020 CLINICAL DATA:  Left leg pain and swelling EXAM: LEFT LOWER EXTREMITY VENOUS DOPPLER ULTRASOUND TECHNIQUE: Gray-scale sonography with compression, as well as color and duplex ultrasound, were performed to evaluate the deep venous system(s) from the level of the common femoral vein through the popliteal and proximal calf veins. COMPARISON:  None. FINDINGS: VENOUS Normal compressibility of the common femoral, superficial femoral, and popliteal veins, as well as the visualized calf veins. Visualized portions of profunda femoral vein and great saphenous vein unremarkable. No filling defects to suggest DVT on grayscale or  color Doppler imaging. Doppler waveforms show normal direction of venous flow, normal respiratory plasticity and response to augmentation. Limited views of the contralateral common femoral vein are unremarkable. OTHER None. Limitations: none IMPRESSION: Negative. Electronically Signed   By: Katherine Mantle M.D.   On: 05/06/2020 18:42   [place] EKG SITE RITE  Result Date: 05/07/2020 If Site Rite image not attached, placement could not be confirmed due to current cardiac rhythm.    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   Will certainly impact respiratory mechanics, ventilator weaning Suspect will need to consider additional PEEP  ACUTE KIDNEY INJURY/Renal Failure -continue Foley Catheter-assess need -Avoid nephrotoxic agents -Follow urine output, BMP -Ensure adequate renal perfusion, optimize oxygenation -Renal dose medications     NEUROLOGY - intubated and sedated - minimal sedation to achieve a RASS goal: -1   CARDIAC ICU monitoring  ID dx of aspiration pneumonia -continue IV abx as prescibed -follow up cultures  GI GI PROPHYLAXIS as indicated  NUTRITIONAL  STATUS DIET-->TF's as tolerated Constipation protocol as indicated   ENDO - will use ICU hypoglycemic\Hyperglycemia protocol if needed    ELECTROLYTES -follow labs as needed -replace as needed -pharmacy consultation and following    DVT/GI PRX ordered and assessed TRANSFUSIONS AS NEEDED MONITOR FSBS I Assessed the need for Labs I Assessed the need for Foley I Assessed the need for Central Venous Line Family Discussion when available I Assessed the need for Mobilization I made an Assessment of medications to be adjusted accordingly Safety Risk assessment Completed  CASE DISCUSSED IN MULTIDISCIPLINARY ROUNDS WITH ICU TEAM   Critical Care Time devoted to patient care services described in this note is 65 minutes.   Overall, patient is critically ill, prognosis is guarded.  Patient with Multiorgan failure and at high risk for cardiac arrest and death.    Lucie Leather, M.D.  [facility]  Medical Director [facility] Medical Director [facility] Cardio-Pulmonary Department

## 2020-05-07 NOTE — Progress Notes (Signed)
Wife came and took all of patients belongings home, including clothing, cell phone, and keys. Nothing left at bedside.

## 2020-05-07 NOTE — Progress Notes (Signed)
   05/07/20 1122  What Happened  Was fall witnessed? Yes  Who witnessed fall? Richarda Blade, RT  Patients activity before fall bathroom-unassisted  Point of contact other (comment) (left knee and left hand)  Was patient injured? No  Follow Up  MD notified Dr. Denton Lank  Time MD notified 1115  Family notified Yes - comment  Time family notified 1134  Additional tests Yes-comment (ABG obtained)  Simple treatment Ice (ice applied to left knee)  Progress note created (see row info) Yes  Adult Fall Risk Assessment  Risk Factor Category (scoring not indicated) Fall has occurred during this admission (document High fall risk)  Age 23  Fall History: Fall within 6 months prior to admission 0  Elimination; Bowel and/or Urine Incontinence 0  Elimination; Bowel and/or Urine Urgency/Frequency 0  Medications: includes PCA/Opiates, Anti-convulsants, Anti-hypertensives, Diuretics, Hypnotics, Laxatives, Sedatives, and Psychotropics 5  Patient Care Equipment 1  Mobility-Assistance 0  Mobility-Gait 0  Mobility-Sensory Deficit 0  Altered awareness of immediate physical environment 0  Impulsiveness 0  Lack of understanding of one's physical/cognitive limitations 0  Total Score 6  Patient Fall Risk Level High fall risk  Adult Fall Risk Interventions  Required Bundle Interventions *See Row Information* High fall risk - low, moderate, and high requirements implemented  Additional Interventions Use of appropriate toileting equipment (bedpan, BSC, etc.);Room near nurses station  Screening for Fall Injury Risk (To be completed on HIGH fall risk patients) - Assessing Need for Low Bed  Risk For Fall Injury- Low Bed Criteria Previous fall this admission  Vitals  BP 135/79  BP Location Right Arm  BP Method Automatic  Patient Position (if appropriate) Sitting  Pulse Rate 89  Pulse Rate Source Dinamap  Resp 20  Oxygen Therapy  SpO2 99 %  O2 Device Nasal Cannula  O2 Flow Rate (L/min) 3 L/min  Pain  Assessment  Pain Scale 0-10  Pain Score 7  Pain Type Acute pain  Pain Location Knee  Pain Orientation Left  Pain Intervention(s) Cold applied  PCA/Epidural/Spinal Assessment  Respiratory Pattern Regular;Unlabored  Neurological  Neuro (WDL) X  Level of Consciousness Responds to Voice  Orientation Level Oriented X4  Cognition Follows commands;Appropriate at baseline  Speech Clear  Musculoskeletal  Musculoskeletal (WDL) X  Assistive Device Cane (pt states he uses a cane at home PRN)  Generalized Weakness Yes  Weight Bearing Restrictions No  Integumentary  Integumentary (WDL) WDL

## 2020-05-07 NOTE — Progress Notes (Addendum)
Progress Note  Patient Name: Charles HowellsKendrick Boeckman Date of Encounter: 05/07/2020  Primary Cardiologist: Lorine BearsMuhammad Arida, MD   Subjective   Subjective limited by patient somnolence / mental status this AM.  Inpatient Medications    Scheduled Meds: . allopurinol  100 mg Oral Daily  . aspirin EC  81 mg Oral Daily  . carvedilol  25 mg Oral BID WC  . cholecalciferol  4,000 Units Oral Daily  . colchicine  0.3 mg Oral QODAY  . enoxaparin (LOVENOX) injection  40 mg Subcutaneous Q12H  . furosemide  80 mg Intravenous Q12H  . insulin aspart  0-9 Units Subcutaneous Q4H  . ipratropium-albuterol  3 mL Nebulization Q4H  . multivitamin with minerals  1 tablet Oral Daily  . pantoprazole  40 mg Oral Daily  . sildenafil  20 mg Oral TID  . sodium chloride flush  3 mL Intravenous Q12H  . spironolactone  25 mg Oral Daily   Continuous Infusions: . sodium chloride     PRN Meds: sodium chloride, albuterol, dextromethorphan-guaiFENesin, hydrALAZINE, ondansetron (ZOFRAN) IV, promethazine, sodium chloride flush   Vital Signs    Vitals:   05/07/20 0152 05/07/20 0329 05/07/20 0753 05/07/20 0757  BP: (!) 158/89 (!) 152/87  132/73  Pulse: 95 90  91  Resp: 14 15  14   Temp:  98.3 F (36.8 C)  98.5 F (36.9 C)  TempSrc:      SpO2: 99% 100% 99% 100%  Weight:      Height:        Intake/Output Summary (Last 24 hours) at 05/07/2020 0913 Last data filed at 05/07/2020 0520 Gross per 24 hour  Intake 723 ml  Output 3350 ml  Net -2627 ml   Last 3 Weights 05/06/2020 05/05/2020 05/05/2020  Weight (lbs) 331 lb 6.4 oz 332 lb 1.6 oz 327 lb  Weight (kg) 150.322 kg 150.64 kg 148.326 kg      Telemetry    SR-ST, 90s - low 100s - Personally Reviewed  ECG    No new tracings - Personally Reviewed  Physical Exam   GEN: No acute distress.  Sitting in recliner next to bed. Neck:  JVD/JVP difficult to assess due to body habitus Cardiac: RRR, no murmurs, rubs, or gallops.  Respiratory: Decreased breath sounds  anteriorly. Unable to sit forward for posterior ausculatation. GI: Obese, firm, distended MS: Bilateral tight 2-3+ edema; No deformity. Neuro:  Somnolent, AMS Psych: Somnolent   Labs    High Sensitivity Troponin:   Recent Labs  Lab 05/05/20 0119 05/05/20 0455  TROPONINIHS 6 6      Chemistry Recent Labs  Lab 05/05/20 0119 05/06/20 0602 05/07/20 0324  NA 138 143 140  K 3.3* 4.2 4.8  CL 92* 96* 92*  CO2 32 34* 34*  GLUCOSE 129* 141* 115*  BUN 19 21* 20  CREATININE 1.53* 1.20 1.32*  CALCIUM 9.7 10.3 9.9  GFRNONAA 54* >60 >60  GFRAA >60 >60 >60  ANIONGAP 14 13 14      Hematology Recent Labs  Lab 05/05/20 0119 05/06/20 0602  WBC 8.6 8.2  RBC 5.52 5.66  HGB 13.2 14.0  HCT 44.0 45.3  MCV 79.7* 80.0  MCH 23.9* 24.7*  MCHC 30.0 30.9  RDW 17.9* 18.6*  PLT 284 267    BNP Recent Labs  Lab 05/05/20 0119  BNP 46.0     DDimer No results for input(s): DDIMER in the last 168 hours.   Radiology    US Venous Img Lower Unilateral Left (DVT)  Result Date: 05/06/2020 CLINICAL DATA:  Left leg pain and swelling EXAM: LEFT LOWER EXTREMITY VENOUS DOPPLER ULTRASOUND TECHNIQUE: Gray-scale sonography with compression, as well as color and duplex ultrasound, were performed to evaluate the deep venous system(s) from the level of the common femoral vein through the popliteal and proximal calf veins. COMPARISON:  None. FINDINGS: VENOUS Normal compressibility of the common femoral, superficial femoral, and popliteal veins, as well as the visualized calf veins. Visualized portions of profunda femoral vein and great saphenous vein unremarkable. No filling defects to suggest DVT on grayscale or color Doppler imaging. Doppler waveforms show normal direction of venous flow, normal respiratory plasticity and response to augmentation. Limited views of the contralateral common femoral vein are unremarkable. OTHER None. Limitations: none IMPRESSION: Negative. Electronically Signed   By:  Katherine Mantle M.D.   On: 05/06/2020 18:42   ECHOCARDIOGRAM COMPLETE  Result Date: 05/05/2020    ECHOCARDIOGRAM REPORT   Patient Name:   Charles Rangel Date of Exam: 05/05/2020 Medical Rec #:  [id number]        Height:       66.0 in Accession #:    [id number]       Weight:       327.0 lb Date of Birth:  12-27-1975        BSA:          2.465 m Patient Age:    44 years         BP:           156/109 mmHg Patient Gender: M                HR:           87 bpm. Exam Location:  [facility] Procedure: 2D Echo and Intracardiac Opacification Agent Indications:     Diastolic CHF  History:         Patient has prior history of Echocardiogram examinations. CHF,                  COPD; Risk Factors:Hypertension and Morbid Obesity.  Sonographer:     L Thornton-Maynard Referring Phys:  [id number] Brien Few NIU Diagnosing Phys: Julien Nordmann MD  Sonographer Comments: No apical window, no subcostal window, Technically challenging study due to limited acoustic windows and patient is morbidly obese. Image acquisition challenging due to COPD. IMPRESSIONS  1. Left ventricular ejection fraction, by estimation, is 60 to 65%. The left ventricle has normal function. The left ventricle has no regional wall motion abnormalities. Left ventricular diastolic parameters are indeterminate.  2. Right ventricular systolic function is normal. The right ventricular size is normal. Tricuspid regurgitation signal is inadequate for assessing PA pressure. FINDINGS  Left Ventricle: Left ventricular ejection fraction, by estimation, is 60 to 65%. The left ventricle has normal function. The left ventricle has no regional wall motion abnormalities. Definity contrast agent was given IV to delineate the left ventricular  endocardial borders. The left ventricular internal cavity size was normal in size. There is no left ventricular hypertrophy. Left ventricular diastolic parameters are indeterminate. Right Ventricle: The right ventricular size is normal. No increase in  right ventricular wall thickness. Right ventricular systolic function is normal. Tricuspid regurgitation signal is inadequate for assessing PA pressure. Left Atrium: Left atrial size was normal in size. Right Atrium: Right atrial size was normal in size. Pericardium: There is no evidence of pericardial effusion. Mitral Valve: The mitral valve was not well visualized. Normal mobility of the mitral valve leaflets. No evidence of mitral  valve regurgitation. No evidence of mitral valve stenosis. Tricuspid Valve: The tricuspid valve is not well visualized. Tricuspid valve regurgitation is not demonstrated. No evidence of tricuspid stenosis. Aortic Valve: The aortic valve was not well visualized. Aortic valve regurgitation is not visualized. No aortic stenosis is present. Aortic valve mean gradient measures 5.0 mmHg. Aortic valve peak gradient measures 8.2 mmHg. Aortic valve area, by VTI measures 2.97 cm. Pulmonic Valve: The pulmonic valve was normal in structure. Pulmonic valve regurgitation is not visualized. No evidence of pulmonic stenosis. Aorta: The aortic root is normal in size and structure. Venous: The inferior vena cava is normal in size with greater than 50% respiratory variability, suggesting right atrial pressure of 3 mmHg. IAS/Shunts: No atrial level shunt detected by color flow Doppler.  LEFT VENTRICLE PLAX 2D LVIDd:         3.59 cm  Diastology LVIDs:         2.40 cm  LV e' lateral:   9.14 cm/s LV PW:         1.64 cm  LV E/e' lateral: 15.1 LV IVS:        1.78 cm  LV e' medial:    7.13 cm/s LVOT diam:     2.20 cm  LV E/e' medial:  19.4 LV SV:         87 LV SV Index:   35 LVOT Area:     3.80 cm  LEFT ATRIUM         Index LA diam:    4.30 cm 1.74 cm/m  AORTIC VALVE                    PULMONIC VALVE AV Area (Vmax):    3.13 cm     PV Vmax:       1.32 m/s AV Area (Vmean):   2.91 cm     PV Peak grad:  7.0 mmHg AV Area (VTI):     2.97 cm AV Vmax:           143.50 cm/s AV Vmean:          107.150 cm/s AV VTI:             0.292 m AV Peak Grad:      8.2 mmHg AV Mean Grad:      5.0 mmHg LVOT Vmax:         118.00 cm/s LVOT Vmean:        81.900 cm/s LVOT VTI:          0.228 m LVOT/AV VTI ratio: 0.78  AORTA Ao Root diam: 3.20 cm MITRAL VALVE MV Area (PHT): 3.30 cm     SHUNTS MV Decel Time: 230 msec     Systemic VTI:  0.23 m MV E velocity: 138.00 cm/s  Systemic Diam: 2.20 cm MV A velocity: 83.10 cm/s MV E/A ratio:  1.66 Julien Nordmann MD Electronically signed by Julien Nordmann MD Signature Date/Time: 05/05/2020/9:46:08 PM    Final     Cardiac Studies   Echo 05/05/20 1. Left ventricular ejection fraction, by estimation, is 60 to 65%. The  left ventricle has normal function. The left ventricle has no regional  wall motion abnormalities. Left ventricular diastolic parameters are  indeterminate.  2. Right ventricular systolic function is normal. The right ventricular  size is normal. Tricuspid regurgitation signal is inadequate for assessing  PA pressure.   RHC Conclusions: 1. Moderately elevated left heart filling pressure. 2. Severely elevated right heart and pulmonary artery  pressures. 3. Normal Fick cardiac output/index. Recommendations: 1. Escalate diuresis. 2. Ongoing management of underlying lung disease (including OSA) per primary and pulmonary teams.    Patient Profile     44 y.o. male with history ofHFpEF, morbid obesity, obstructive sleep apneawith broken CPAP, hypertension, diabetes, right lower extremity DVT in the setting of prolonged travel, remote tobacco abuse, and COPD seen today for acute on chronic CHF.  Assessment & Plan    Acute on chronic diastolic CHF --Remains on 5L Regino Ramirez oxygen. Somnolent and mentation poor this AM. --Most recent echo with nl EF as above.  --Past RHC with elevated LV filling pressure, RH pressure, and PASP as below.  --Remains significantly volume up on exam.  --Continue IV diuresis with lasix 80mg  q12h as renal function and BP allow. --Received  metolazone 5 mg x1 yesterday to facilitate initiation of diuresis. --Continue to monitor I's/O's, daily standing weights.  --Net - 3.5L yesterday and -77cc today. Wt 148.3kg  150kg,. No wt yet today.   --BMET daily. Cr 1.53  1.2  1.32. BUN 19  21  20. (Baseline Cr ~1.3). --Continue Coreg 25mg  BID. Consider up-titration for further HR / BP control.  --Continue Spironolactone 25mg  daily. --Continue sildenafil for pulmonary HTN. --ACE/ARB currently deferred to allow for aggressive diuresis. --Consider addition of ACE/ARB before discharge if Cr allows given DM2. --Given somnolence, agree with recommendation for BiPAP. --As previously noted, he may need discharged on higher dose torsemide. He was reportedly compliant with outpatient oral diuresis.  Ongoing CHF education and office follow-up.  Pulmonary hypertension --As previously noted, RHC showed severe pulmonary hypertension, severely elevated left heart pressures (PVR 2.9 Woods units, PCWP 30, mean PAP 55 mmHg). Pulmonary hypertension thought likely multifactorial with significant left heart disease/diastolic dysfunction, as well as uncontrolled sleep apnea and morbid obesity.  Continue heart failure treatment as above.  Continue CPAP and sildenafil.  Hypertensive heart disease --BP relatively well controlled. Continuemedical management as above.ACE/ARB currently deferred to allow for aggressive diuresis. Consider addition of ACE/ARB before discharge if renal function allows given comorbid DM2 and per GDMT.  DM2 - Continue SSI. Per IM  OSA - CPAP use encouraged.  Morbid obesity - Weight loss advised. Consider OHS as well. Complicates his comorbid conditions.   For questions or updates, please contact [facility] Please consult [email or web address] for contact info under        Signed, , PA-C  05/07/2020, 9:13 AM

## 2020-05-07 NOTE — Progress Notes (Addendum)
Initial Nutrition Assessment  DOCUMENTATION CODES:   Morbid obesity  INTERVENTION:   If tube feeds initiated, recommend:   Vital HP @40ml /hr + Prostat 67ml QID  Propofol: 9.02 ml/hr- provides 238kcal/day  Free water flushes 42ml q4 hours to maintain tube patency   Regimen provides 1598kcal/day, 144g/day protien and 979ml/day free water   MVI daily via tube    NUTRITION DIAGNOSIS:   Inadequate oral intake related to inability to eat (pt sedated and ventilated) as evidenced by NPO status.  GOAL:   Provide needs based on ASPEN/SCCM guidelines  MONITOR:   Vent status, Labs, Weight trends, Skin, I & O's  REASON FOR ASSESSMENT:   Ventilator    ASSESSMENT:   44 y.o. male with medical history significant of hypertension, diabetes mellitus, COPD, GERD, gout, OSA, DVT on anticoagulants, CHF, pulmonary hypertension and CKD stage II who presents with shortness of breath.   Pt is known to this RD from multiple previous admits. Pt generally with good appetite and oral intake at baseline. Pt documented to have eaten 10% of his breakfast this morning. Pt also documented to have had nausea and vomiting overnight and this morning while on bipap. Pt now sedated and ventilated. OGT in place. No plans for tube feeds today. Per chart, pt with weight gain pta likely r/t fluid changes.   Medications reviewed and include: allopurinol, aspirin, D3, MVI, protonix, aldactone, unasyn, levophed, propofol   Labs reviewed: K 4.8 wnl, creat 1.32(H) cbgs- 127, 142, 139, 120, 126 x 24 hrs AIC 5.4- 5/31  Patient is currently intubated on ventilator support MV: 7.6 L/min Temp (24hrs), Avg:98.4 F (36.9 C), Min:98 F (36.7 C), Max:98.7 F (37.1 C)  Propofol: 9.02 ml/hr- provides 238kcal/day   MAP- >43mmHg  UOP- 77m  NUTRITION - FOCUSED PHYSICAL EXAM:    Most Recent Value  Orbital Region No depletion  Upper Arm Region No depletion  Thoracic and Lumbar Region No depletion  Buccal  Region No depletion  Temple Region No depletion  Clavicle Bone Region No depletion  Clavicle and Acromion Bone Region No depletion  Scapular Bone Region No depletion  Dorsal Hand No depletion  Patellar Region No depletion  Anterior Thigh Region No depletion  Posterior Calf Region No depletion  Edema (RD Assessment) Mild  Hair Reviewed  Eyes Reviewed  Mouth Reviewed  Skin Reviewed  Nails Reviewed     Diet Order:   Diet Order            Diet heart healthy/carb modified Room service appropriate? Yes; Fluid consistency: Thin; Fluid restriction: 1500 mL Fluid  Diet effective now                EDUCATION NEEDS:   No education needs have been identified at this time  Skin:  Skin Assessment: Reviewed RN Assessment  Last BM:  7/16  Height:   Ht Readings from Last 1 Encounters:  05/05/20 5\' 6"  (1.676 m)    Weight:   Wt Readings from Last 1 Encounters:  05/06/20 (!) 150.3 kg    Ideal Body Weight:  64.5 kg  BMI:  Body mass index is 53.49 kg/m.  Estimated Nutritional Needs:   Kcal:  1420-1613kcal/day  Protein:  130-160g/day  Fluid:  2L/day  MS, RD, LDN Please refer to [facility] for RD and/or RD on-call/weekend/after hours pager

## 2020-05-07 NOTE — Progress Notes (Signed)
Mobility Specialist - Progress Note   05/07/20 1200  Mobility  Activity Refused mobility (Pt transferring to CCU)  Mobility performed by Mobility specialist    Pt transferring to CCU. Not medically appropriate for mobility at this time.  Filiberto Pinks Mobility Specialist 05/07/20, 12:11 PM

## 2020-05-07 NOTE — Progress Notes (Signed)
Pt again vomited with Bipap on. Replaced mask & tubing but pt currently on Edwards @ 3 lpm with 99% sat. No obvious sign of aspiration.

## 2020-05-08 ENCOUNTER — Ambulatory Visit (HOSPITAL_COMMUNITY)
Admission: AD | Admit: 2020-05-08 | Discharge: 2020-05-08 | Disposition: A | Discharge status: Home / Self Care | Payer: No Typology Code available for payment source | Source: Other Acute Inpatient Hospital | Attending: Internal Medicine | Admitting: Internal Medicine

## 2020-05-08 DIAGNOSIS — A419 Sepsis, unspecified organism: Secondary | ICD-10-CM | POA: Insufficient documentation

## 2020-05-08 DIAGNOSIS — N183 Chronic kidney disease, stage 3 unspecified: Secondary | ICD-10-CM

## 2020-05-08 DIAGNOSIS — R579 Shock, unspecified: Secondary | ICD-10-CM

## 2020-05-08 LAB — BASIC METABOLIC PANEL
Anion gap: 12 (ref 5–15)
BUN: 24 mg/dL — ABNORMAL HIGH (ref 6–20)
CO2: 44 mmol/L — ABNORMAL HIGH (ref 22–32)
Calcium: 9.9 mg/dL (ref 8.9–10.3)
Chloride: 89 mmol/L — ABNORMAL LOW (ref 98–111)
Creatinine, Ser: 1.45 mg/dL — ABNORMAL HIGH (ref 0.61–1.24)
GFR calc Af Amer: >60 mL/min (ref >60)
GFR calc non Af Amer: 58 mL/min — ABNORMAL LOW (ref >60)
Glucose, Bld: 137 mg/dL — ABNORMAL HIGH (ref 70–99)
Potassium: 3.4 mmol/L — ABNORMAL LOW (ref 3.5–5.1)
Sodium: 145 mmol/L (ref 135–145)

## 2020-05-08 LAB — GLUCOSE, CAPILLARY
Glucose-Capillary: 121 mg/dL — ABNORMAL HIGH (ref 70–99)
Glucose-Capillary: 138 mg/dL — ABNORMAL HIGH (ref 70–99)
Glucose-Capillary: 139 mg/dL — ABNORMAL HIGH (ref 70–99)
Glucose-Capillary: 141 mg/dL — ABNORMAL HIGH (ref 70–99)
Glucose-Capillary: 170 mg/dL — ABNORMAL HIGH (ref 70–99)
Glucose-Capillary: 189 mg/dL — ABNORMAL HIGH (ref 70–99)

## 2020-05-08 LAB — MAGNESIUM
Magnesium: 1.9 mg/dL (ref 1.7–2.4)
Magnesium: 1.9 mg/dL (ref 1.7–2.4)

## 2020-05-08 LAB — POTASSIUM: Potassium: 3.2 mmol/L — ABNORMAL LOW (ref 3.5–5.1)

## 2020-05-08 LAB — SARS CORONAVIRUS 2 BY RT PCR (HOSPITAL ORDER, PERFORMED IN ~~LOC~~ HOSPITAL LAB): SARS Coronavirus 2: NEGATIVE

## 2020-05-08 LAB — PHOSPHORUS: Phosphorus: 3.5 mg/dL (ref 2.5–4.6)

## 2020-05-08 MED ORDER — NOREPINEPHRINE 4 MG/250ML-% IV SOLN: 0.0000 ug/min | INTRAVENOUS | Status: DC

## 2020-05-08 MED ORDER — PROPOFOL 1000 MG/100ML IV EMUL: 5.0000 ug/kg/min | INTRAVENOUS | Status: DC

## 2020-05-08 MED ORDER — POTASSIUM CHLORIDE 10 MEQ/50ML IV SOLN
10.0000 meq | INTRAVENOUS | Status: DC
  Administered 2020-05-08 (×3): 10 meq via INTRAVENOUS
  Filled 2020-05-08 (×6): qty 50

## 2020-05-08 MED ORDER — PANTOPRAZOLE SODIUM 40 MG PO TBEC: 40.0000 mg | DELAYED_RELEASE_TABLET | Freq: Every day | ORAL | Status: DC

## 2020-05-08 MED ORDER — VITAL HIGH PROTEIN PO LIQD
1000.0000 mL | ORAL | Status: DC
  Administered 2020-05-08: 1000 mL

## 2020-05-08 MED ORDER — ENOXAPARIN SODIUM 40 MG/0.4ML ~~LOC~~ SOLN: 40.0000 mg | Freq: Two times a day (BID) | SUBCUTANEOUS | Status: DC

## 2020-05-08 MED ORDER — MAGNESIUM SULFATE 2 GM/50ML IV SOLN
2.0000 g | Freq: Once | INTRAVENOUS | Status: AC
  Administered 2020-05-08: 2 g via INTRAVENOUS
  Filled 2020-05-08: qty 50

## 2020-05-08 MED ORDER — SODIUM CHLORIDE 0.9 % IV SOLN: 3.0000 g | Freq: Four times a day (QID) | INTRAVENOUS | Status: DC

## 2020-05-08 MED ORDER — FENTANYL 2500MCG IN NS 250ML (10MCG/ML) PREMIX INFUSION: 0.0000 ug/h | INTRAVENOUS | Status: DC

## 2020-05-08 MED ORDER — PRO-STAT 64 PO LIQD
60.0000 mL | Freq: Four times a day (QID) | ORAL | Status: DC
  Administered 2020-05-08 (×4): 60 mL
  Filled 2020-05-08: qty 60

## 2020-05-08 NOTE — Progress Notes (Signed)
Nutrition Follow-up  DOCUMENTATION CODES:   Morbid obesity  INTERVENTION:  Initiate Vital High Protein at 20 mL/hr + Pro-Stat 60 mL QID per tube. Provides 1280 kcal, 162 grams of protein, 403 mL H2O daily. With current propofol rate provides 1995 kcal daily.  Continue MVI daily per tube.  NUTRITION DIAGNOSIS:   Inadequate oral intake related to inability to eat (pt sedated and ventilated) as evidenced by NPO status.  Ongoing.  GOAL:   Provide needs based on ASPEN/SCCM guidelines  Progressing with initiation of tube feeds today.  MONITOR:   Vent status, Labs, Weight trends, Skin, I & O's  REASON FOR ASSESSMENT:   Ventilator    ASSESSMENT:   44 y.o. male with medical history significant of hypertension, diabetes mellitus, COPD, GERD, gout, OSA, DVT on anticoagulants, CHF, pulmonary hypertension and CKD stage II who presents with shortness of breath.  Patient is currently intubated on ventilator support MV: 7.3 L/min Temp (24hrs), Avg:98 F (36.7 C), Min:97.5 F (36.4 C), Max:98.6 F (37 C)  Propofol: 27.1 ml/hr (715 kcal daily)  Medications reviewed and include: allopurinol, vitamin D3 4000 units daily, Novolog 0-9 units Q4hrs, MVI daily, Protonix, Unasyn, fentanyl gtt, norepinephrine gtt at 12 mcg/min, propofol gtt.  Labs reviewed: CBG 121-150, Potassium 3.4, Chloride 89, CO2 44, BUN 24, Creatinine 1.45.  I/O: 4801 mL UOP yesterday (1.3 mL/kg/hr) + 3 occurrences unmeasured UOP yesterday  Weight trend: 153.8 kg on 5/20; + 3.2 kg from 7/17  Enteral Access: OGT placed 7/19; terminates in gastric fundus per abdominal x-ray 7/19  Discussed with RN and on rounds. Plan is to start tube feeds today. Patient now on higher dose of propofol gtt.  Diet Order:   Diet Order    None     EDUCATION NEEDS:   No education needs have been identified at this time  Skin:  Skin Assessment: Reviewed RN Assessment  Last BM:  Unknown  Height:   Ht Readings from Last 1  Encounters:  05/08/20 5' 5.98" (1.676 m)   Weight:   Wt Readings from Last 1 Encounters:  05/08/20 (!) 153.8 kg   Ideal Body Weight:  64.5 kg  BMI:  Body mass index is 54.74 kg/m.  Estimated Nutritional Needs:   Kcal:  1420-1613kcal/day  Protein:  161 grams (2.5 grams/kg IBW)  Fluid:  2L/day  Felix Pacini, MS, RD, LDN Pager number available on Amion

## 2020-05-08 NOTE — Progress Notes (Addendum)
CRITICAL CARE NOTE  SYNOPSIS 44 year old male with multiple comorbidities, hospitalized for acute on chronic heart failure. Medical problems include morbid obesity with BMI of 53,type 2 diabetes,obstructive sleep apnea, and gout.  Most recently he was hospitalized May, and MARCH  During that admission he underwent right heart catheterization which demonstrated findings consistent with moderately elevated left heart filling pressures, severely elevated PA pressure and right heart pressures, and preserved cardiac output.  He was started on empiric sildenafil and continued on his outpatient diuretic regimen but transition from furosemide to torsemide on discharge.  7/17 admitted for worsening SOB and DOE, fluid overload and pulm edema  7/19 Patient transferred to ICU for increased WOB and SOB Placed on bIPAP,+vomiting and aspiration pneumonia  7/19 emergently intubated, placed on vent, PICC line placed Wife updated in detail  7/20 remains intubated and sedated, severe resp failure      CC  follow up respiratory failure  SUBJECTIVE Patient remains critically ill Prognosis is guarded Progressive cardiorenal syndrome   BP (!) 101/58   Pulse 85   Temp 97.9 F (36.6 C) (Axillary)   Resp 15   Ht 5' 5.98" (1.676 m)   Wt (!) 153.8 kg   SpO2 (!) 87%   BMI 54.74 kg/m    I/O last 3 completed shifts: In: 1982.7 [P.O.:120; I.V.:1562.7; IV Piggyback:300] Out: 5851 [Urine:5151; Emesis/NG output:700] No intake/output data recorded.  SpO2: (!) 87 % O2 Flow Rate (L/min): 3 L/min FiO2 (%): 29 %  Estimated body mass index is 54.74 kg/m as calculated from the following:   Height as of this encounter: 5' 5.98" (1.676 m).   Weight as of this encounter: 153.8 kg.  SIGNIFICANT EVENTS   REVIEW OF SYSTEMS  PATIENT IS UNABLE TO PROVIDE COMPLETE REVIEW OF SYSTEMS DUE TO SEVERE CRITICAL ILLNESS        PHYSICAL EXAMINATION:  GENERAL:critically ill appearing, +resp  distress HEAD: Normocephalic, atraumatic.  EYES: Pupils equal, round, reactive to light.  No scleral icterus.  MOUTH: Moist mucosal membrane. NECK: Supple.  PULMONARY: +rhonchi, +wheezing CARDIOVASCULAR: S1 and S2. Regular rate and rhythm. No murmurs, rubs, or gallops.  GASTROINTESTINAL: Soft, nontender, -distended.  Positive bowel sounds.   MUSCULOSKELETAL: + edema.  NEUROLOGIC: obtunded, GCS<8 SKIN:intact,warm,dry  MEDICATIONS: I have reviewed all medications and confirmed regimen as documented  CBC    Component Value Date/Time   WBC 8.2 05/06/2020 0602   RBC 5.66 05/06/2020 0602   HGB 14.0 05/06/2020 0602   HGB 14.8 07/28/2014 2317   HCT 45.3 05/06/2020 0602   HCT 46.2 07/28/2014 2317   PLT 267 05/06/2020 0602   PLT 243 07/28/2014 2317   MCV 80.0 05/06/2020 0602   MCV 78 (L) 07/28/2014 2317   MCH 24.7 (L) 05/06/2020 0602   MCHC 30.9 05/06/2020 0602   RDW 18.6 (H) 05/06/2020 0602   RDW 19.9 (H) 07/28/2014 2317   LYMPHSABS 1.1 03/18/2020 0843   LYMPHSABS 0.6 (L) 05/26/2013 0411   MONOABS 0.6 03/18/2020 0843   MONOABS 0.1 (L) 05/26/2013 0411   EOSABS 0.2 03/18/2020 0843   EOSABS 0.0 05/26/2013 0411   BASOSABS 0.0 03/18/2020 0843   BASOSABS 0.0 05/26/2013 0411   BMP Latest Ref Rng & Units 05/08/2020 05/07/2020 05/06/2020  Glucose 70 - 99 mg/dL 137(H) 115(H) 141(H)  BUN 6 - 20 mg/dL 24(H) 20 21(H)  Creatinine 0.61 - 1.24 mg/dL 1.45(H) 1.32(H) 1.20  Sodium 135 - 145 mmol/L 145 140 143  Potassium 3.5 - 5.1 mmol/L 3.4(L) 4.8 4.2  Chloride 98 -  111 mmol/L 89(L) 92(L) 96(L)  CO2 22 - 32 mmol/L 44(H) 34(H) 34(H)  Calcium 8.9 - 10.3 mg/dL 9.9 9.9 10.3    CULTURE RESULTS   Recent Results (from the past 240 hour(s))  SARS Coronavirus 2 by RT PCR (hospital order, performed in [facility] hospital lab) Nasopharyngeal Nasopharyngeal Swab     Status: None   Collection Time: 05/05/20  7:27 AM   Specimen: Nasopharyngeal Swab  Result Value Ref Range Status   SARS Coronavirus 2  NEGATIVE NEGATIVE Final    Comment: (NOTE) SARS-CoV-2 target nucleic acids are NOT DETECTED.  The SARS-CoV-2 RNA is generally detectable in upper and lower respiratory specimens during the acute phase of infection. The lowest concentration of SARS-CoV-2 viral copies this assay can detect is 250 copies / mL. A negative result does not preclude SARS-CoV-2 infection and should not be used as the sole basis for treatment or other patient management decisions.  A negative result may occur with improper specimen collection / handling, submission of specimen other than nasopharyngeal swab, presence of viral mutation(s) within the areas targeted by this assay, and inadequate number of viral copies (<250 copies / mL). A negative result must be combined with clinical observations, patient history, and epidemiological information.  Fact Sheet for Patients:   StrictlyIdeas.no  Fact Sheet for Healthcare Providers: BankingDealers.co.za  This test is not yet approved or  cleared by the [place] FDA and has been authorized for detection and/or diagnosis of SARS-CoV-2 by FDA under an Emergency Use Authorization (EUA).  This EUA will remain in effect (meaning this test can be used) for the duration of the COVID-19 declaration under Section 564(b)(1) of the Act, 21 U.S.C. section 360bbb-3(b)(1), unless the authorization is terminated or revoked sooner.  Performed at [facility], [address] [place], [place] [address]   MRSA PCR Screening     Status: None   Collection Time: 05/07/20  1:02 PM   Specimen: Nasal Mucosa; Nasopharyngeal  Result Value Ref Range Status   MRSA by PCR NEGATIVE NEGATIVE Final    Comment:        The GeneXpert MRSA Assay (FDA approved for NASAL specimens only), is one component of a comprehensive MRSA colonization surveillance program. It is not intended to diagnose MRSA infection nor to guide or monitor  treatment for MRSA infections. Performed at [facility], [address] [place], [place] [address]           IMAGING    DG Abd 1 View  Result Date: 05/07/2020 CLINICAL DATA:  Nasogastric tube placement EXAM: ABDOMEN - 1 VIEW COMPARISON:  None. FINDINGS: Single view radiograph the abdomen excludes the pelvis and right flank. Nasogastric tube appears looped within the proximal stomach with its tip within the expected fundus. Visualized abdominal gas pattern is unremarkable. Mild to moderate cardiomegaly is incidentally noted. IMPRESSION: Nasogastric tube within the stomach with its tip within the gastric fundus. Electronically Signed   By: Fidela Salisbury MD   On: 05/07/2020 15:45   DG Chest Port 1 View  Result Date: 05/07/2020 CLINICAL DATA:  Status post intubation EXAM: PORTABLE CHEST 1 VIEW COMPARISON:  05/05/2020 FINDINGS: Endotracheal tube is been placed, 3.8 cm above the carina. Pulmonary insufflation is stable and the lungs are well expanded and are symmetric. Superimposed mid and lower lung zone interstitial pulmonary infiltrate and central pulmonary vascular congestion, in keeping with changes of mild to moderate cardiogenic failure, appear stable. Mild development of band like atelectasis within the mid  right lung zone. No pneumothorax or pleural effusion. Mild cardiomegaly is stable. No acute bone abnormality. Nasogastric tube extends into the upper abdomen and loops into the gastric fundus. IMPRESSION: Endotracheal tube and nasogastric tube in appropriate position. Preserved pulmonary insufflation. Stable central pulmonary vascular congestion and superimposed interstitial pulmonary infiltrate most in keeping with mild to moderate cardiogenic failure. Electronically Signed   By: Fidela Salisbury MD   On: 05/07/2020 15:44   [place] EKG SITE RITE  Result Date: 05/07/2020 If Site Rite image not attached, placement could not be confirmed due to current cardiac rhythm.    Nutrition  Status: Nutrition Problem: Inadequate oral intake Etiology: inability to eat (pt sedated and ventilated) Signs/Symptoms: NPO status       Indwelling Urinary Catheter continued, requirement due to   Reason to continue Indwelling Urinary Catheter strict Intake/Output monitoring for hemodynamic instability   Central Line/ continued, requirement due to  Reason to continue [place] of central venous pressure or other hemodynamic parameters and poor IV access   Ventilator continued, requirement due to severe respiratory failure   Ventilator Sedation RASS 0 to -2      ASSESSMENT AND PLAN SYNOPSIS  44 yo morbidly obese AAM with Severe ACUTE Hypoxic and Hypercapnic Respiratory Failure due to acute on chornic diastolic heart failure with  cor pulmonale and PULM HTN with acute aspiration pneumonia, progressive cardiorenal syndrome  Severe ACUTE Hypoxic and Hypercapnic Respiratory Failure -continue Full MV support -continue Bronchodilator Therapy -Wean Fio2 and PEEP as tolerated -will perform SAT/SBT when respiratory parameters are met -VAP/VENT bundle implementation  ACUTE DIASTOLIC CARDIAC FAILURE- -oxygen as needed -Lasix as tolerated -follow up cardiac enzymes as indicated -follow up cardiology recs   Morbid obesity, possible OSA.   Will certainly impact respiratory mechanics, ventilator weaning Suspect will need to consider additional PEEP  ACUTE KIDNEY INJURY/Renal Failure -continue Foley Catheter-assess need -Avoid nephrotoxic agents -Follow urine output, BMP -Ensure adequate renal perfusion, optimize oxygenation -Renal dose medications     NEUROLOGY Acute toxic metabolic encephalopathy, need for sedation Goal RASS -2 to -3  CARDIAC ICU monitoring   GI GI PROPHYLAXIS as indicated  DIET-->TF's as tolerated Constipation protocol as indicated  ENDO - will use ICU hypoglycemic\Hyperglycemia protocol if indicated     ELECTROLYTES -follow  labs as needed -replace as needed -pharmacy consultation and following   DVT/GI PRX ordered and assessed TRANSFUSIONS AS NEEDED MONITOR FSBS I Assessed the need for Labs I Assessed the need for Foley I Assessed the need for Central Venous Line Family Discussion when available I Assessed the need for Mobilization I made an Assessment of medications to be adjusted accordingly Safety Risk assessment completed   CASE DISCUSSED IN MULTIDISCIPLINARY ROUNDS WITH ICU TEAM  Critical Care Time devoted to patient care services described in this note is 42 minutes.   Overall, patient is critically ill, prognosis is guarded.  Patient with Multiorgan failure and at high risk for cardiac arrest and death.    Corrin Parker, M.D.  [facility]  Medical Director [place] Director [facility] Cardio-Pulmonary Department

## 2020-05-08 NOTE — Progress Notes (Signed)
Report called to [facility] RN Steward Drone, patient awaiting available transportation

## 2020-05-08 NOTE — Discharge Summary (Signed)
Physician Discharge Summary  Patient ID: Charles Rangel MRN: [id number] DOB/AGE: 04/21/1976 44 y.o.  Admit date: 05/05/2020 Discharge date: 05/08/2020  Admission Diagnoses: ACUTE ON CHRONIC DIASTOLIC HEART FAILURE ACUTE ASPIRATION PNEUMONITIS  Discharge Diagnoses:  Principal Problem:   Acute on chronic diastolic CHF (congestive heart failure) (HCC) Active Problems:   Gout   HTN (hypertension)   Pulmonary hypertension, unspecified ([place])   CKD stage 2 due to type 2 diabetes mellitus (HCC)   GERD (gastroesophageal reflux disease)   Acute on chronic respiratory failure with hypoxia (HCC)   Hypokalemia     Hospital Course:  44 year old male with multiple comorbidities, hospitalized for acute on chronic heart failure. Medical problems include morbid obesity with BMI of 53,type 2 diabetes,obstructive sleep apnea, and gout.  Most recently he was hospitalized May, and MARCH  During that admission he underwent right heart catheterization which demonstrated findings consistent with moderately elevated left heart filling pressures, severely elevated PA pressure and right heart pressures, and preserved cardiac output.  He was started on empiric sildenafil and continued on his outpatient diuretic regimen but transition from furosemide to torsemide on discharge.  7/17 admitted for worsening SOB and DOE, fluid overload and pulm edema  7/19 Patient transferred to ICU for increased WOB and SOB Placed on bIPAP,+vomiting and aspiration pneumonia  7/19 emergently intubated, placed on vent, PICC line placed Wife updated in detail  7/20 remains intubated and sedated, severe resp failure   44 yo morbidly obese AAM with Severe ACUTE Hypoxic and Hypercapnic Respiratory Failuredue to acute on chornic diastolic heart failure with  cor pulmonale and PULM HTN with acute aspiration pneumonia, progressive cardiorenal syndrome  Severe ACUTE Hypoxic and Hypercapnic Respiratory  Failure -continue Full MV support -continue Bronchodilator Therapy -Wean Fio2 and PEEP as tolerated -will perform SAT/SBT when respiratory parameters are met -VAP/VENT bundle implementation  ACUTE DIASTOLIC CARDIAC FAILURE- -oxygen as needed -Lasix as tolerated -follow up cardiac enzymes as indicated -follow up cardiology recs   Morbid obesity, possible OSA.  Will certainly impact respiratory mechanics, ventilator weaning Suspect will need to consider additional PEEP  ACUTE KIDNEY INJURY/Renal Failure -continue Foley Catheter-assess need -Avoid nephrotoxic agents -Follow urine output, BMP -Ensure adequate renal perfusion, optimize oxygenation -Renal dose medications    NEUROLOGY Acute toxic metabolic encephalopathy, need for sedation Goal RASS -2 to -3  CARDIAC ICU monitoring   GI GI PROPHYLAXIS as indicated  DIET-->TF's as tolerated Constipation protocol as indicated  ENDO - will use ICU hypoglycemic\Hyperglycemia protocol if indicated    ELECTROLYTES -follow labs as needed -replace as needed -pharmacy consultation and following   DVT/GI PRX ordered and assessed TRANSFUSIONS AS NEEDED MONITOR FSBS I Assessed the need for Labs I Assessed the need for Foley I Assessed the need for Central Venous Line Family Discussion when available I Assessed the need for Mobilization I made an Assessment of medications to be adjusted accordingly Safety Risk assessment completed  Discharge Exam: Blood pressure 132/71, pulse 85, temperature 98.7 F (37.1 C), resp. rate 15, height 5' 5.98" (1.676 m), weight (!) 153.8 kg, SpO2 91 %.   Disposition: [facility]  Discharge Instructions    [facility] pharmacist to adjust dose for Vancomycin, Aminoglycosides and other anti-infective therapies as requested by physician.   Complete by: As directed    [facility] to provide Cath Flo 45m   Complete by: As directed    Administer for  PICC line occlusion and as ordered by physician for other access device issues.   Anaphylaxis  Kit: Provided to treat any anaphylactic reaction to the medication being provided to the patient if First Dose or when requested by physician   Complete by: As directed    Epinephrine 46m/ml vial / amp: Administer 0.380m(0.86m101msubcutaneously once for moderate to severe anaphylaxis, nurse to call physician and pharmacy when reaction occurs and call 911 if needed for immediate care   Diphenhydramine 69m81m IV vial: Administer 25-69mg9mIM PRN for first dose reaction, rash, itching, mild reaction, nurse to call physician and pharmacy when reaction occurs   Sodium Chloride 0.9% NS 500ml 98mAdminister if needed for hypovolemic blood pressure drop or as ordered by physician after call to physician with anaphylactic reaction   Change dressing on IV access line weekly and PRN   Complete by: As directed    Flush IV access with Sodium Chloride 0.9% and Heparin 10 units/ml or 100 units/ml   Complete by: As directed    Home infusion instructions - [facility]   Complete by: As directed    Instructions: Flush IV access with Sodium Chloride 0.9% and Heparin 10units/ml or 100units/ml   Change dressing on IV access line: Weekly and PRN   Instructions Cath Flo 2mg: A81mnister for PICC Line occlusion and as ordered by physician for other access device   [facility] pharmacist to adjust dose for: Vancomycin, Aminoglycosides and other anti-infective therapies as requested by physician   Method of administration may be changed at the discretion of home infusion pharmacist based upon assessment of the patient and/or caregiver's ability to self-administer the medication ordered   Complete by: As directed      Allergies as of 05/08/2020      Reactions   Tylenol [acetaminophen] Anaphylaxis   Dog Epithelium Allergy Skin Test Hives   Apple Hives   Other Hives   RAISINS      Medication List     STOP taking these medications   albuterol 108 (90 Base) MCG/ACT inhaler Commonly known as: VENTOLIN HFA   allopurinol 100 MG tablet Commonly known as: ZYLOPRIM   aspirin EC 81 MG tablet   carvedilol 25 MG tablet Commonly known as: COREG   colchicine 0.6 MG tablet   metFORMIN 500 MG tablet Commonly known as: GLUCOPHAGE   multivitamin with minerals Tabs tablet   omeprazole 20 MG capsule Commonly known as: PriLOSEC Replaced by: pantoprazole 40 MG tablet   sildenafil 20 MG tablet Commonly known as: REVATIO   spironolactone 25 MG tablet Commonly known as: ALDACTONE   torsemide 20 MG tablet Commonly known as: DEMADEX   Vitamin D3 25 MCG (1000 UT) Caps     TAKE these medications   Ampicillin-Sulbactam 3 g in sodium chloride 0.9 % 100 mL Inject 3 g into the vein every 6 (six) hours.   enoxaparin 40 MG/0.4ML injection Commonly known as: LOVENOX Inject 0.4 mLs (40 mg total) into the skin every 12 (twelve) hours.   fentaNYL 10 mcg/ml Soln infusion Inject 0-400 mcg/hr into the vein continuous.   norepinephrine 4-5 MG/250ML-% Soln Commonly known as: LEVOPHED Inject 0-40 mcg/min into the vein continuous.   pantoprazole 40 MG tablet Commonly known as: PROTONIX Take 1 tablet (40 mg total) by mouth daily. Start taking on: May 09, 2020 Replaces: omeprazole 20 MG capsule   propofol 1000 MG/100ML Emul injection Commonly known as: DIPRIVAN Inject 751.5-12,024 mcg/min into the vein continuous.            Discharge Care Instructions  (From admission, onward)  Start     Ordered   05/08/20 0000  Change dressing on IV access line weekly and PRN  (Home infusion instructions - [facility] )        05/08/20 1543          Follow-up Information    [place] Follow up on 05/25/2020.   Specialty: Cardiology Why: at 2:00pm. Enter through the [place] entrance Contact information: [address] [place] [phone]              Signed: Flora Lipps 05/08/2020, 3:43 PM

## 2020-05-08 NOTE — Progress Notes (Addendum)
Progress Note  Patient Name: Charles Rangel Date of Encounter: 05/08/2020  [facility] Cardiologist: Kathlyn Sacramento, MD   Subjective   Patient intubated and sedated.  Requiring norepinephrine for BP support.  Inpatient Medications    Scheduled Meds: . allopurinol  100 mg Oral Daily  . aspirin EC  81 mg Oral Daily  . budesonide (PULMICORT) nebulizer solution  0.5 mg Nebulization BID  . carvedilol  6.25 mg Oral BID WC  . chlorhexidine gluconate (MEDLINE KIT)  15 mL Mouth Rinse BID  . Chlorhexidine Gluconate Cloth  6 each Topical Daily  . cholecalciferol  4,000 Units Oral Daily  . colchicine  0.3 mg Oral QODAY  . enoxaparin (LOVENOX) injection  40 mg Subcutaneous Q12H  . furosemide  80 mg Intravenous BID  . insulin aspart  0-9 Units Subcutaneous Q4H  . ipratropium-albuterol  3 mL Nebulization Q4H  . mouth rinse  15 mL Mouth Rinse 10 times per day  . multivitamin with minerals  1 tablet Oral Daily  . pantoprazole  40 mg Oral Daily  . sodium chloride flush  10-40 mL Intracatheter Q12H  . sodium chloride flush  3 mL Intravenous Q12H  . spironolactone  25 mg Oral Daily   Continuous Infusions: . sodium chloride 10 mL/hr at 05/08/20 0600  . ampicillin-sulbactam (UNASYN) IV 3 g (05/08/20 5784)  . fentaNYL infusion INTRAVENOUS 250 mcg/hr (05/08/20 0726)  . norepinephrine (LEVOPHED) Adult infusion 12 mcg/min (05/08/20 0738)  . propofol (DIPRIVAN) infusion 30 mcg/kg/min (05/08/20 0655)   PRN Meds: sodium chloride, albuterol, dextromethorphan-guaiFENesin, hydrALAZINE, ondansetron (ZOFRAN) IV, promethazine, sodium chloride flush, sodium chloride flush, traMADol   Vital Signs    Vitals:   05/08/20 0400 05/08/20 0448 05/08/20 0500 05/08/20 0600  BP: (!) 99/53  (!) 99/58 (!) 101/58  Pulse: 85  89 85  Resp: _0 Temp: 97.9 F (36.6 C)     TempSrc: Axillary     SpO2: 90%  92% (!) 87%  Weight:  (!) 153.8 kg    Height:        Intake/Output Summary (Last 24 hours) at  05/08/2020 0837 Last data filed at 05/08/2020 0600 Gross per 24 hour  Intake 1982.73 ml  Output 4801 ml  Net -2818.27 ml   Last 3 Weights 05/08/2020 05/06/2020 05/05/2020  Weight (lbs) 339 lb 331 lb 6.4 oz 332 lb 1.6 oz  Weight (kg) 153.769 kg 150.322 kg 150.64 kg      Telemetry    NSR - Personally Reviewed  ECG    No new tracing  Physical Exam   GEN: No acute distress.   Neck: Unable to assess JVP due to body habitus and support devices Cardiac: RRR, no murmurs, rubs, or gallops.  Respiratory: Clear anteriorly GI: Soft, nontender, non-distended  MS: Trace pretibial edema Neuro:  Intubated and sedated. Psych: SEdated  Labs    High Sensitivity Troponin:   Recent Labs  Lab 05/05/20 0119 05/05/20 0455  TROPONINIHS 6 6      Chemistry Recent Labs  Lab 05/06/20 0602 05/07/20 0324 05/08/20 0505  NA 143 140 145  K 4.2 4.8 3.4*  CL 96* 92* 89*  CO2 34* 34* 44*  GLUCOSE 141* 115* 137*  BUN 21* 20 24*  CREATININE 1.20 1.32* 1.45*  CALCIUM 10.3 9.9 9.9  GFRNONAA >60 >60 58*  GFRAA >60 >60 >60  ANIONGAP _1 Hematology Recent Labs  Lab 05/05/20 0119 05/06/20 0602  WBC 8.6 8.2  RBC 5.52 5.66  HGB 13.2 14.0  HCT 44.0 45.3  MCV 79.7* 80.0  MCH 23.9* 24.7*  MCHC 30.0 30.9  RDW 17.9* 18.6*  PLT 284 267    BNP Recent Labs  Lab 05/05/20 0119  BNP 46.0     DDimer No results for input(s): DDIMER in the last 168 hours.   Radiology    DG Abd 1 View  Result Date: 05/07/2020 CLINICAL DATA:  Nasogastric tube placement EXAM: ABDOMEN - 1 VIEW COMPARISON:  None. FINDINGS: Single view radiograph the abdomen excludes the pelvis and right flank. Nasogastric tube appears looped within the proximal stomach with its tip within the expected fundus. Visualized abdominal gas pattern is unremarkable. Mild to moderate cardiomegaly is incidentally noted. IMPRESSION: Nasogastric tube within the stomach with its tip within the gastric fundus. Electronically Signed    By: Fidela Salisbury MD   On: 05/07/2020 15:45   US Venous Img Lower Unilateral Left (DVT)  Result Date: 05/06/2020 CLINICAL DATA:  Left leg pain and swelling EXAM: LEFT LOWER EXTREMITY VENOUS DOPPLER ULTRASOUND TECHNIQUE: Gray-scale sonography with compression, as well as color and duplex ultrasound, were performed to evaluate the deep venous system(s) from the level of the common femoral vein through the popliteal and proximal calf veins. COMPARISON:  None. FINDINGS: VENOUS Normal compressibility of the common femoral, superficial femoral, and popliteal veins, as well as the visualized calf veins. Visualized portions of profunda femoral vein and great saphenous vein unremarkable. No filling defects to suggest DVT on grayscale or color Doppler imaging. Doppler waveforms show normal direction of venous flow, normal respiratory plasticity and response to augmentation. Limited views of the contralateral common femoral vein are unremarkable. OTHER None. Limitations: none IMPRESSION: Negative. Electronically Signed   By: Constance Holster M.D.   On: 05/06/2020 18:42   DG Chest Port 1 View  Result Date: 05/07/2020 CLINICAL DATA:  Status post intubation EXAM: PORTABLE CHEST 1 VIEW COMPARISON:  05/05/2020 FINDINGS: Endotracheal tube is been placed, 3.8 cm above the carina. Pulmonary insufflation is stable and the lungs are well expanded and are symmetric. Superimposed mid and lower lung zone interstitial pulmonary infiltrate and central pulmonary vascular congestion, in keeping with changes of mild to moderate cardiogenic failure, appear stable. Mild development of band like atelectasis within the mid right lung zone. No pneumothorax or pleural effusion. Mild cardiomegaly is stable. No acute bone abnormality. Nasogastric tube extends into the upper abdomen and loops into the gastric fundus. IMPRESSION: Endotracheal tube and nasogastric tube in appropriate position. Preserved pulmonary insufflation. Stable central  pulmonary vascular congestion and superimposed interstitial pulmonary infiltrate most in keeping with mild to moderate cardiogenic failure. Electronically Signed   By: Fidela Salisbury MD   On: 05/07/2020 15:44   [place] EKG SITE RITE  Result Date: 05/07/2020 If Site Rite image not attached, placement could not be confirmed due to current cardiac rhythm.   Cardiac Studies   TTE (05/05/20): 1. Left ventricular ejection fraction, by estimation, is 60 to 65%. The  left ventricle has normal function. The left ventricle has no regional  wall motion abnormalities. Left ventricular diastolic parameters are  indeterminate.  2. Right ventricular systolic function is normal. The right ventricular  size is normal. Tricuspid regurgitation signal is inadequate for assessing  PA pressure.   Patient Profile     44 y.o. male man with history of HFpEF, morbid obesity, obstructive sleep apneawith broken CPAP, hypertension, diabetes, right lower extremity DVT in the setting of prolonged travel, remote tobacco abuse,  and COPD, admitted with acute on chronic HFpEF complicated by acute on chronic respiratory failure requiring intubation yesterday.  Assessment & Plan    Acute on chronic respiratory failure with hypoxia and hypercapnia: Patient remains intubated and sedated.  I suspect decompensation was a combination of HF and hypoventilation with worsening CO2 retention.  Vent management per CCM.  Reasonable to try acetazolamide; defer management to CCM.  Acute on chronic HFpEF and pulmonary hypertension: Patient looks significantly less edematous compared with yesterday.  Creatinine trending up, though still near baseline.  Acetazolamide added by CCM.  Continue to monitor acid:base status and urine output closely.  Ok to hold furosemide, though I would have a low threshold for restarting a loop diuretic given that the patient is still likely quite volume overloaded (RHC in 03/2020 showed moderately elevated  left and severely elevated right heart pressures).  Sildenafil on hold in the setting of hypotension as well as acute on chronic diastolic left heart failure.  I would be cautious about long-term pulmonary vasodilator therapy in the setting of significant left heart failure.  Hold spironolactone in the setting of vasopressor dependent hypotension.  Chronic kidney disease: Creatinine up slightly over the last 2 days but still within baseline range.  Ok to hold furosemide today.  I aim for a net even to slightly negative fluid balance.  Avoid nephrotoxic drugs.  Shock: Patient hypotensive, requiring norephinephrine.  I suspect this is multifactorial.  Vasopressor support per CCM.  Maintain net even to slightly negative fluid balance.  Hold carvedilol and spironolactone.  CRITICAL CARE Performed by: Nelva Bush, MD  Total critical care time: 35 minutes  Critical care time was exclusive of separately billable procedures and treating other patients.  Critical care was necessary to treat or prevent imminent or life-threatening deterioration.  Critical care was time spent personally by me (independent of midlevel providers or residents) on the following activities: development of treatment plan with patient and/or surrogate as well as nursing, discussions with consultants, evaluation of patient's response to treatment, examination of patient, obtaining history from patient or surrogate, ordering and performing treatments and interventions, ordering and review of laboratory studies, ordering and review of radiographic studies, pulse oximetry and re-evaluation of patient's condition.    For questions or updates, please contact [facility] Please consult [email or web address] for contact info under [facility] Cardiology.     Signed, Nelva Bush, MD  05/08/2020, 8:37 AM

## 2020-05-08 NOTE — Progress Notes (Addendum)
Remains in NSR. Opens eyes to his mothers voice. Does not tolerate reduction in pain or sedation medication-he bites ET tube.  Yellow mucous suctioned out of nose. Interdry placed around perineal folds and under pannus. Moves very slightly to pain. Transfer to [facility] hospital in process but no bed assigned at time of this note.

## 2020-05-08 NOTE — Progress Notes (Signed)
°   05/08/20 1500  Clinical Encounter Type  Visited With Patient  Visit Type Initial  Referral From Chaplain  Consult/Referral To Chaplain  While rounding, chaplain stood in the doorway and silently prayed for patient.

## 2020-05-08 NOTE — TOC Initial Note (Signed)
Transition of Care [facility]) - Initial/Assessment Note    Patient Details  Name: Charles Rangel MRN: [id number] Date of Birth: 1975-11-22  Transition of Care [facility] CM/SW Contact:    Trenton Founds, RN Phone Number: 05/08/2020, 1:58 PM  Clinical Narrative:    Patient is intubated and sedated. RNCM initially spoke with patient's mother at bedside but then placed call and spoke with patient's wife Charles Rangel. Discussed that due to patient being [facility] it is necessary to inform them and possible request transfer if she is agreeable. Wife verbalizes that she would be agreeable to transfer to the [place] if they were to have a bed available. RNCM completed all necessary paperwork and faxed to [place].  RNCM will follow for needs.           Expected Discharge Plan: Skilled Nursing Facility Barriers to Discharge: Continued Medical Work up   Patient Goals and CMS Choice        Expected Discharge Plan and Services Expected Discharge Plan: Skilled Nursing Facility                                              Prior Living Arrangements/Services   Lives with:: Spouse                   Activities of Daily Living Home Assistive Devices/Equipment: CPAP ADL Screening (condition at time of admission) Patient's cognitive ability adequate to safely complete daily activities?: Yes Is the patient deaf or have difficulty hearing?: No Does the patient have difficulty seeing, even when wearing glasses/contacts?: No Does the patient have difficulty concentrating, remembering, or making decisions?: No Patient able to express need for assistance with ADLs?: Yes Does the patient have difficulty dressing or bathing?: No Independently performs ADLs?: Yes (appropriate for developmental age) Does the patient have difficulty walking or climbing stairs?: Yes Weakness of Legs: Both Weakness of Arms/Hands: None  Permission Sought/Granted                  Emotional Assessment               Admission diagnosis:  Hypoxia [R09.02] Acute on chronic diastolic CHF (congestive heart failure) (HCC) [I50.33] Acute on chronic diastolic (congestive) heart failure (HCC) [I50.33] Congestive heart failure, unspecified HF chronicity, unspecified heart failure type (HCC) [I50.9] Patient Active Problem List   Diagnosis Date Noted  . GERD (gastroesophageal reflux disease) 05/05/2020  . Acute on chronic respiratory failure with hypoxia (HCC) 05/05/2020  . Acute on chronic diastolic CHF (congestive heart failure) (HCC) 05/05/2020  . Hypokalemia 05/05/2020  . Acute on chronic diastolic (congestive) heart failure (HCC) 05/05/2020  . Acute on chronic respiratory failure with hypoxia and hypercapnia (HCC)   . COPD with acute exacerbation (HCC)   . Pulmonary hypertension, unspecified (HCC)   . CKD stage 2 due to type 2 diabetes mellitus (HCC)   . Morbid obesity with BMI of 50.0-59.9, adult (HCC) 12/22/2019  . HTN (hypertension) 11/23/2018  . DM (diabetes mellitus) (HCC) 11/23/2018  . Gout 11/16/2018  . CHF (congestive heart failure) (HCC) 11/14/2018  . Acute on chronic heart failure with preserved ejection fraction (HFpEF) (HCC) 06/28/2018   PCP:  [facility]:   [facility] [place], [place] - [address] [place] [address] Phone: [phone] Fax: [phone]  [id number] [place], [place] -  [address] [place] [address] Phone: [phone] Fax: [phone]     Social Determinants of Health (SDOH) Interventions    Readmission Risk Interventions No flowsheet data found.

## 2020-05-09 NOTE — Progress Notes (Signed)
[facility] arrived at bedside to transport pt to [facility] and upon transitioning pt to their portable ventilator pt became hypoxic with O2 sats decreasing briefly to the 50's (for 15 seconds) requiring manual bag mask ventilation with O2 sats immediately increasing to the 90's.  Pt reconnected to [facility] portable ventilator and filter initially removed with O2 sats increasing to the upper 90's.  Upon arrival at pts bedside pt noted to have adequate chest rise, adequate breath sounds bilaterally, ETT at 26 cm at the lip, end tidal at 54, and all vital signs were stable.  Filter reapplied prior to transport all vss remained stable and pt transported via [facility] to [facility].  Sonda Rumble, [facility]  Pulmonary/Critical Care Pager [phone] (please enter 7 digits) PCCM Consult Pager [phone] (please enter 7 digits)

## 2020-05-25 ENCOUNTER — Ambulatory Visit: Payer: PRIVATE HEALTH INSURANCE | Attending: Family | Admitting: Family

## 2020-05-25 ENCOUNTER — Encounter: Payer: Self-pay | Admitting: Family

## 2020-05-25 ENCOUNTER — Other Ambulatory Visit: Payer: Self-pay

## 2020-05-25 VITALS — BP 117/53 | HR 94 | Resp 20 | Ht 66.0 in | Wt 312.1 lb

## 2020-05-25 DIAGNOSIS — I10 Essential (primary) hypertension: Secondary | ICD-10-CM

## 2020-05-25 DIAGNOSIS — Z7982 Long term (current) use of aspirin: Secondary | ICD-10-CM | POA: Diagnosis not present

## 2020-05-25 DIAGNOSIS — Z886 Allergy status to analgesic agent status: Secondary | ICD-10-CM | POA: Insufficient documentation

## 2020-05-25 DIAGNOSIS — Z6841 Body Mass Index (BMI) 40.0 and over, adult: Secondary | ICD-10-CM | POA: Diagnosis not present

## 2020-05-25 DIAGNOSIS — J449 Chronic obstructive pulmonary disease, unspecified: Secondary | ICD-10-CM | POA: Diagnosis not present

## 2020-05-25 DIAGNOSIS — I110 Hypertensive heart disease with heart failure: Secondary | ICD-10-CM | POA: Diagnosis present

## 2020-05-25 DIAGNOSIS — Z7984 Long term (current) use of oral hypoglycemic drugs: Secondary | ICD-10-CM | POA: Insufficient documentation

## 2020-05-25 DIAGNOSIS — Z9989 Dependence on other enabling machines and devices: Secondary | ICD-10-CM | POA: Diagnosis not present

## 2020-05-25 DIAGNOSIS — I5032 Chronic diastolic (congestive) heart failure: Secondary | ICD-10-CM | POA: Insufficient documentation

## 2020-05-25 DIAGNOSIS — Z87891 Personal history of nicotine dependence: Secondary | ICD-10-CM | POA: Diagnosis not present

## 2020-05-25 DIAGNOSIS — I272 Pulmonary hypertension, unspecified: Secondary | ICD-10-CM | POA: Diagnosis not present

## 2020-05-25 DIAGNOSIS — M109 Gout, unspecified: Secondary | ICD-10-CM | POA: Diagnosis not present

## 2020-05-25 DIAGNOSIS — E119 Type 2 diabetes mellitus without complications: Secondary | ICD-10-CM | POA: Insufficient documentation

## 2020-05-25 DIAGNOSIS — Z86718 Personal history of other venous thrombosis and embolism: Secondary | ICD-10-CM | POA: Insufficient documentation

## 2020-05-25 DIAGNOSIS — Z8249 Family history of ischemic heart disease and other diseases of the circulatory system: Secondary | ICD-10-CM | POA: Diagnosis not present

## 2020-05-25 DIAGNOSIS — G4733 Obstructive sleep apnea (adult) (pediatric): Secondary | ICD-10-CM | POA: Diagnosis not present

## 2020-05-25 DIAGNOSIS — Z79899 Other long term (current) drug therapy: Secondary | ICD-10-CM | POA: Insufficient documentation

## 2020-05-25 LAB — GLUCOSE, CAPILLARY: Glucose-Capillary: 208 mg/dL — ABNORMAL HIGH (ref 70–99)

## 2020-05-25 NOTE — Patient Instructions (Addendum)
Continue weighing daily and call for an overnight weight gain of > 2 pounds or a weekly weight gain of >5 pounds.   Please fax discharge summary, last lab work, echo etc to [phone]   Local pulmonology groups:  [facility] pulmonology [facility] pulmonology [facility]

## 2020-05-25 NOTE — Progress Notes (Signed)
Patient ID: Charles Rangel, male    DOB: 12-Aug-1976, 44 y.o.   MRN: [id number]  HPI  Mr Chauca is a 44 y/o male with a history of Dm, HTN, COPD, OSA, DVT, gout, obesity, previous tobacco use and chronic heart failure.   Echo report from 05/05/20 reviewed and showed an EF of 60-65%. Echo report from 12/22/19 reviewed and showed an EF of 60-65% along with moderate LVH. Echo report from 11/17/2018 reviewed and showed an EF of 60-65%.  Catheterization done 03/20/20 showed: 1. Moderately elevated left heart filling pressure. 2. Severely elevated right heart and pulmonary artery pressures. 3. Normal Fick cardiac output/index.  Admitted 05/05/20 due to acute on chronic heart failure. Cardiology consult obtained. Initially needed bipap and then subsequently placed on ventilator. Discharged after 4 days to the [place]. Admitted 03/18/20 due to acute on chronic heart failure. Cardiology and pulmonology consults obtained. RHC done. Initially given IV lasix with transition to oral diuretics.  Able to be weaned off oxygen. Negative 12.4L. Discharged after 5 days.   He presents today for a follow-up visit with a chief complaint of moderate fatigue upon minimal exertion. He describes this as having been present for several years but is slightly worse since his recent 20 day hospital stay. He has associated cough and back pain along with this. He denies any difficulty sleeping, dizziness, abdominal distention, palpitations, pedal edema, chest pain, shortness of breath or weight gain.   Says that he's been home ~ 1 week from his discharge at the [place]. Wife that is on speakerphone says that he needs to see a pulmonologist locally.    Past Medical History:  Diagnosis Date   (HFpEF) heart failure with preserved ejection fraction (HCC)    a. 02/2018 Echo: EF 65-70%, mildly dil LA.   CHF (congestive heart failure) (HCC)    COPD (chronic obstructive pulmonary disease) (HCC)    Diabetes mellitus without complication (HCC)     DVT (deep venous thrombosis) (HCC)    Gout    Hypertension    Morbid obesity (HCC)    OSA (obstructive sleep apnea)    Past Surgical History:  Procedure Laterality Date   Left leg surgery for fracture     RIGHT HEART CATH N/A 03/20/2020   Procedure: RIGHT HEART CATH;  Surgeon: Yvonne Kendall, MD;  Location: [facility] INVASIVE CV LAB;  Service: Cardiovascular;  Laterality: N/A;   Family History  Problem Relation Age of Onset   Hypertension Mother    Hypertension Father    CVA Father 74   Social History   Tobacco Use   Smoking status: Former Smoker    Packs/day: 1.00    Years: 20.00    Pack years: 20.00    Quit date: 10/20/2010    Years since quitting: 9.6   Smokeless tobacco: Never Used  Substance Use Topics   Alcohol use: Not Currently   Allergies  Allergen Reactions   Tylenol [Acetaminophen] Anaphylaxis   Dog Epithelium Allergy Skin Test Hives   Apple Hives   Other Hives    RAISINS   Prior to Admission medications   Medication Sig Start Date End Date Taking? Authorizing Provider  allopurinol (ZYLOPRIM) 100 MG tablet Take 100 mg by mouth daily.   Yes [provider]  aspirin 81 MG chewable tablet Chew 81 mg by mouth daily.   Yes [provider]  cholecalciferol (VITAMIN D3) 25 MCG (1000 UNIT) tablet Take 1,000 Units by mouth daily.   Yes [provider]  colchicine  0.6 MG tablet Take 0.6 mg by mouth as needed.   Yes [provider]  metFORMIN (GLUCOPHAGE) 1000 MG tablet Take 1,000 mg by mouth 2 (two) times daily with a meal.   Yes [provider]  Multiple Vitamins-Minerals (MULTIVITAMIN WITH MINERALS) tablet Take 1 tablet by mouth daily.   Yes [provider]  sildenafil (REVATIO) 20 MG tablet Take 20 mg by mouth 3 (three) times daily.   Yes [provider]  torsemide (DEMADEX) 20 MG tablet Take 40 mg by mouth 2 (two) times daily.   Yes [provider]     Review of Systems   Constitutional: Positive for fatigue. Negative for appetite change.  HENT: Negative for congestion, rhinorrhea and sore throat.   Eyes: Negative.   Respiratory: Positive for cough (dry). Negative for chest tightness and shortness of breath.   Cardiovascular: Negative for chest pain, palpitations and leg swelling.  Gastrointestinal: Negative for abdominal distention and abdominal pain.  Endocrine: Negative.   Genitourinary: Negative.   Musculoskeletal: Positive for back pain. Negative for arthralgias.  Skin: Negative.   Allergic/Immunologic: Negative.   Neurological: Negative for dizziness, light-headedness and headaches.  Hematological: Negative for adenopathy. Does not bruise/bleed easily.  Psychiatric/Behavioral: Negative for dysphoric mood and sleep disturbance (sleeping well with bipap). The patient is not nervous/anxious.    Vitals:   05/25/20 1235  BP: (!) 117/53  Pulse: 94  Resp: 20  SpO2: 93%  Weight: (!) 312 lb 2 oz (141.6 kg)  Height: 5\' 6"  (1.676 m)   Wt Readings from Last 3 Encounters:  05/25/20 (!) 312 lb 2 oz (141.6 kg)  05/08/20 (!) 339 lb (153.8 kg)  04/09/20 (!) 324 lb (147 kg)   Lab Results  Component Value Date   CREATININE 1.45 (H) 05/08/2020   CREATININE 1.32 (H) 05/07/2020   CREATININE 1.20 05/06/2020   Physical Exam Vitals and nursing note reviewed.  Constitutional:      Appearance: Normal appearance.  HENT:     Head: Normocephalic and atraumatic.  Cardiovascular:     Rate and Rhythm: Normal rate and regular rhythm.  Pulmonary:     Effort: Pulmonary effort is normal. No respiratory distress.     Breath sounds: No wheezing or rales.  Abdominal:     General: There is no distension.     Palpations: Abdomen is soft.  Musculoskeletal:        General: No tenderness.     Cervical back: Normal range of motion and neck supple.     Right lower leg: No edema.     Left lower leg: No edema.  Skin:    General: Skin is warm and dry.  Neurological:      General: No focal deficit present.     Mental Status: He is alert and oriented to person, place, and time.  Psychiatric:        Mood and Affect: Mood normal.        Behavior: Behavior normal.    Assessment & Plan:  1: Chronic heart failure with preserved ejection fraction along with structural changes- - NYHA class III - euvolemic today - weighing daily and he was reminded to call for an overnight weight gain of >2 pounds or a weekly weight gain of >5 pounds - weight down 14 pounds from last visit here 2 months ago (had had recent lengthy hospital stay) - not adding salt  - saw cardiology 05/08/2020) 04/09/20 - BNP 05/05/20 was 46.1  2: HTN- - BP looks  good today - sees PCP at the [place] & returns on 05/30/20 - BMP 05/08/20 reviewed and showed sodium 145, potassium 3.4, creatinine 1.45 and GFR >60  3: DM- - A1c 03/19/20 was 5.4% - nonfasting glucose in clinic today was 208  4: Pulmonary HTN- - sildenafil  - listed all 3 pulmonology groups in [place] so that his PCP can make the referral.  - wearing bipap nightly   Patient did not bring his medications nor a list. Each medication was verbally reviewed with the patient and he was encouraged to bring the bottles to every visit to confirm accuracy of list.  Return in 3 months or sooner for any questions/problems before then.

## 2020-08-06 ENCOUNTER — Ambulatory Visit: Payer: No Typology Code available for payment source | Admitting: Family

## 2020-08-06 NOTE — Progress Notes (Deleted)
Office Visit    Patient Name: Charles Rangel Date of Encounter: 08/06/2020  Primary Care Provider:  [facility] Primary Cardiologist:  Lorine Bears, MD Electrophysiologist:  None   Chief Complaint    Charles Rangel is a 44 y.o. male with a hx of  HFpEF, COPD, DM 2, remote DVT of RLE in setting of prolonged travel, remote tobacco use, HTN, obesity, OSA with intermittent adherence of CPAP, gout presents today for ***   Past Medical History    Past Medical History:  Diagnosis Date  . (HFpEF) heart failure with preserved ejection fraction (HCC)    a. 02/2018 Echo: EF 65-70%, mildly dil LA.  . CHF (congestive heart failure) (HCC)   . COPD (chronic obstructive pulmonary disease) (HCC)   . Diabetes mellitus without complication (HCC)   . DVT (deep venous thrombosis) (HCC)   . Gout   . Hypertension   . Morbid obesity (HCC)   . OSA (obstructive sleep apnea)    Past Surgical History:  Procedure Laterality Date  . Left leg surgery for fracture    . RIGHT HEART CATH N/A 03/20/2020   Procedure: RIGHT HEART CATH;  Surgeon: Yvonne Kendall, MD;  Location: [facility] INVASIVE CV LAB;  Service: Cardiovascular;  Laterality: N/A;    Allergies  Allergies  Allergen Reactions  . Tylenol [Acetaminophen] Anaphylaxis  . Dog Epithelium Allergy Skin Test Hives  . Apple Hives  . Other Hives    RAISINS    History of Present Illness    Charles Rangel is a 44 y.o. male with a hx of   HFpEF, COPD, DM 2, remote DVT of RLE in setting of prolonged travel, remote tobacco use, HTN, obesity, OSA with intermittent adherence of CPAP, gout.  He was last seen in [facility] heart failure clinic 05/25/2020 by Clarisa Kindred, NP  Admitted to the hospital 02/2017 with acute on chronic respiratory failure in the setting of COPD exacerbation and mild CHF.  Echo with normal LVEF.  Ch[facility]ically he is followed at the [facility].  Admitted 06/2018 with recurrent heart failure exacerbation and gout.   Readmitted to [facility] 1/25 with acute on chronic diastolic CHF in the setting of dietary instruction and oriented medications.  Echo during that admission with LVEF 60 to 65%, mild LVH, diastolic dysfunction, no significant valvular abnormalities.  Seen in the ED 08/2019 with pleuritic chest pain.  CTA chest negative for PE.  Suspicion for possible COVID-19 infection although still follow-up with negative.  High-sensitivity troponins were negative x2.  EKG showed SR with no acute ST/T wave changes.  He was advised to follow-up as outpatient.  Admitted 12/2018 with acute on chronic HFpEF and severe pulmonary hypertension requiring IV diuresis and symptom improvement.  CTA of chest negative for PE with markedly dilated main pulmonary artery suggesting severe chronic pulmonary arterial hypertension.  Echo LVEF 60 to 65%, no regional wall motion abnormalities, moderate LVH, indeterminate LV diastolic function, RV normal size and function, mildly dilated LA.  Outpatient follow-up with his cardiologist in the [place] was recommended but not completed.  Presented to [facility] 03/18/2020 with 1 week history of increased DOE.  He had been taking Lasix 80 mg daily 100 doubled without improvement in his symptoms.  CXR with cardiomegaly with vascular congestion and apparent degree of pulmonary arterial hypertension with no frank edema or airspace consolidation.  Pulmonology and cardiology were consulted with recommendation for empiric sildenafil and RHC.  RHC 03/20/2020 with moderately elevated left heart filling pressures, severely  elevated right heart and pulmonary artery pressures, normal Fick cardiac output/index.  Recommended to escalate diuresis and for continue management of underlying lung disease per primary and pulmonology.  His Lasix was transitioned to torsemide on discharge.  His sildenafil was continued on discharge. -12.4L for admission.   ***  EKGs/Labs/Other Studies Reviewed:   The following studies were reviewed  today:  RHC 03/20/2020: Conclusions: 1. Moderately elevated left heart filling pressure. 2. Severely elevated right heart and pulmonary artery pressures. 3. Normal Fick cardiac output/index.   Recommendations: 1. Escalate diuresis. 2. Ongoing management of underlying lung disease (including OSA) per primary and pulmonary teams. __________   2D echo 12/2019: 1. Left ventricular ejection fraction, by estimation, is 60 to 65%. The  left ventricle has normal function. The left ventricle has no regional  wall motion abnormalities. There is moderate left ventricular hypertrophy.  Left ventricular diastolic  parameters are indeterminate.   2. Right ventricular systolic function is normal. The right ventricular  size is normal.   3. Left atrial size was mildly dilated.   EKG:  EKG is *** ordered today.  The ekg ordered today demonstrates ***  Recent Labs: 12/22/2019: ALT 27 03/20/2020: TSH 2.305 05/05/2020: B Natriuretic Peptide 46.0 05/06/2020: Hemoglobin 14.0; Platelets 267 05/08/2020: BUN 24; Creatinine, Ser 1.45; Magnesium 1.9; Potassium 3.2; Sodium 145  Recent Lipid Panel    Component Value Date/Time   CHOL 149 05/26/2013 0411   TRIG 70 05/07/2020 0324   TRIG 53 05/26/2013 0411   HDL 51 05/26/2013 0411   VLDL 11 05/26/2013 0411   LDLCALC 87 05/26/2013 0411    Risk Assessment/Calculations:  {Does this patient have ATRIAL FIBRILLATION?:[id number]}  Home Medications   No outpatient medications have been marked as taking for the 08/06/20 encounter (Appointment) with Alver Sorrow, NP.     Review of Systems   ***   ROS All other systems reviewed and are otherwise negative except as noted above.  Physical Exam    VS:  There were no vitals taken for this visit. , BMI There is no height or weight on file to calculate BMI.  Wt Readings from Last 3 Encounters:  05/25/20 (!) 312 lb 2 oz (141.6 kg)  05/08/20 (!) 339 lb (153.8 kg)  04/09/20 (!) 324 lb (147 kg)     GEN:  Well nourished, well developed, in no acute distress. HEENT: normal. Neck: Supple, no JVD, carotid bruits, or masses. Cardiac: ***RRR, no murmurs, rubs, or gallops. No clubbing, cyanosis, edema.  ***Radials/DP/PT 2+ and equal bilaterally.  Respiratory:  ***Respirations regular and unlabored, clear to auscultation bilaterally. GI: Soft, nontender, nondistended. MS: No deformity or atrophy. Skin: Warm and dry, no rash. Neuro:  Strength and sensation are intact. Psych: Normal affect.  Assessment & Plan    1. ***  Disposition: Follow up {follow up:15908} with Dr. Kirke Corin or APP.  Signed, Alver Sorrow, NP 08/06/2020, 8:49 AM [facility]

## 2020-08-07 ENCOUNTER — Encounter: Payer: Self-pay | Admitting: Family

## 2020-08-22 NOTE — Progress Notes (Deleted)
Patient ID: Charles Rangel, male    DOB: 10/12/1976, 44 y.o.   MRN: [id number]  HPI  Charles Rangel is a 44 y/o male with a history of Dm, HTN, COPD, OSA, DVT, gout, obesity, previous tobacco use and chronic heart failure.   Echo report from 05/05/20 reviewed and showed an EF of 60-65%. Echo report from 12/22/19 reviewed and showed an EF of 60-65% along with moderate LVH. Echo report from 11/17/2018 reviewed and showed an EF of 60-65%.  Catheterization done 03/20/20 showed: 1. Moderately elevated left heart filling pressure. 2. Severely elevated right heart and pulmonary artery pressures. 3. Normal Fick cardiac output/index.  Admitted 05/05/20 due to acute on chronic heart failure. Cardiology consult obtained. Initially needed bipap and then subsequently placed on ventilator. Discharged after 4 days to the [place]. Admitted 03/18/20 due to acute on chronic heart failure. Cardiology and pulmonology consults obtained. RHC done. Initially given IV lasix with transition to oral diuretics.  Able to be weaned off oxygen. Negative 12.4L. Discharged after 5 days.   Charles Rangel presents today for a follow-up visit with a chief complaint of moderate fatigue upon minimal exertion. Charles Rangel describes this as having been present for several years but is slightly worse since his recent 20 day hospital stay. Charles Rangel has associated cough and back pain along with this. Charles Rangel denies any difficulty sleeping, dizziness, abdominal distention, palpitations, pedal edema, chest pain, shortness of breath or weight gain.   Says that Charles Rangel's been home ~ 1 week from his discharge at the [place]. Wife that is on speakerphone says that Charles Rangel needs to see a pulmonologist locally.    Past Medical History:  Diagnosis Date  . (HFpEF) heart failure with preserved ejection fraction (HCC)    a. 02/2018 Echo: EF 65-70%, mildly dil LA.  . CHF (congestive heart failure) (HCC)   . COPD (chronic obstructive pulmonary disease) (HCC)   . Diabetes mellitus without complication (HCC)    . DVT (deep venous thrombosis) (HCC)   . Gout   . Hypertension   . Morbid obesity (HCC)   . OSA (obstructive sleep apnea)    Past Surgical History:  Procedure Laterality Date  . Left leg surgery for fracture    . RIGHT HEART CATH N/A 03/20/2020   Procedure: RIGHT HEART CATH;  Surgeon: Yvonne Kendall, MD;  Location: [facility] INVASIVE CV LAB;  Service: Cardiovascular;  Laterality: N/A;   Family History  Problem Relation Age of Onset  . Hypertension Mother   . Hypertension Father   . CVA Father 22   Social History   Tobacco Use  . Smoking status: Former Smoker    Packs/day: 1.00    Years: 20.00    Pack years: 20.00    Quit date: 10/20/2010    Years since quitting: 9.8  . Smokeless tobacco: Never Used  Substance Use Topics  . Alcohol use: Not Currently   Allergies  Allergen Reactions  . Tylenol [Acetaminophen] Anaphylaxis  . Dog Epithelium Allergy Skin Test Hives  . Apple Hives  . Other Hives    RAISINS      Review of Systems  Constitutional: Positive for fatigue. Negative for appetite change.  HENT: Negative for congestion, rhinorrhea and sore throat.   Eyes: Negative.   Respiratory: Positive for cough (dry). Negative for chest tightness and shortness of breath.   Cardiovascular: Negative for chest pain, palpitations and leg swelling.  Gastrointestinal: Negative for abdominal distention and abdominal pain.  Endocrine: Negative.   Genitourinary: Negative.   Musculoskeletal: Positive  for back pain. Negative for arthralgias.  Skin: Negative.   Allergic/Immunologic: Negative.   Neurological: Negative for dizziness, light-headedness and headaches.  Hematological: Negative for adenopathy. Does not bruise/bleed easily.  Psychiatric/Behavioral: Negative for dysphoric mood and sleep disturbance (sleeping well with bipap). The patient is not nervous/anxious.     Physical Exam Vitals and nursing note reviewed.  Constitutional:      Appearance: Normal appearance.  HENT:      Head: Normocephalic and atraumatic.  Cardiovascular:     Rate and Rhythm: Normal rate and regular rhythm.  Pulmonary:     Effort: Pulmonary effort is normal. No respiratory distress.     Breath sounds: No wheezing or rales.  Abdominal:     General: There is no distension.     Palpations: Abdomen is soft.  Musculoskeletal:        General: No tenderness.     Cervical back: Normal range of motion and neck supple.     Right lower leg: No edema.     Left lower leg: No edema.  Skin:    General: Skin is warm and dry.  Neurological:     General: No focal deficit present.     Mental Status: Charles Rangel is alert and oriented to person, place, and time.  Psychiatric:        Mood and Affect: Mood normal.        Behavior: Behavior normal.    Assessment & Plan:  1: Chronic heart failure with preserved ejection fraction along with structural changes- - NYHA class III - euvolemic today - weighing daily and Charles Rangel was reminded to call for an overnight weight gain of >2 pounds or a weekly weight gain of >5 pounds - weight down 14 pounds from last visit here 2 months ago (had had recent lengthy hospital stay) - not adding salt  - saw cardiology Dan Humphreys) 04/09/20 - BNP 05/05/20 was 46.1  2: HTN- - BP looks good today - sees PCP at the [place] & returns on 05/30/20 - BMP 05/08/20 reviewed and showed sodium 145, potassium 3.4, creatinine 1.45 and GFR >60  3: DM- - A1c 03/19/20 was 5.4% - nonfasting glucose in clinic today was 208  4: Pulmonary HTN- - sildenafil  - listed all 3 pulmonology groups in [place] so that his PCP can make the referral.  - wearing bipap nightly   Patient did not bring his medications nor a list. Each medication was verbally reviewed with the patient and Charles Rangel was encouraged to bring the bottles to every visit to confirm accuracy of list.  Return in 3 months or sooner for any questions/problems before then.

## 2020-08-24 ENCOUNTER — Ambulatory Visit: Payer: No Typology Code available for payment source | Admitting: Family

## 2020-08-24 ENCOUNTER — Telehealth: Payer: Self-pay | Admitting: Family

## 2020-08-24 NOTE — Telephone Encounter (Signed)
Patient did not show for his Heart Failure Clinic appointment on 08/24/20. Will attempt to reschedule.

## 2020-08-30 ENCOUNTER — Telehealth: Payer: Self-pay | Admitting: Family

## 2020-08-30 ENCOUNTER — Ambulatory Visit: Payer: No Typology Code available for payment source | Admitting: Family

## 2020-08-30 NOTE — Telephone Encounter (Signed)
Patient did not show for his Heart Failure Clinic appointment on 08/30/20. Will attempt to reschedule.

## 2020-09-12 ENCOUNTER — Ambulatory Visit: Payer: No Typology Code available for payment source | Admitting: Family

## 2020-09-12 ENCOUNTER — Telehealth: Payer: Self-pay | Admitting: Family

## 2020-09-12 NOTE — Telephone Encounter (Signed)
Patient did not show for his Heart Failure Clinic appointment on 09/12/20. Will attempt to reschedule.

## 2020-09-15 ENCOUNTER — Other Ambulatory Visit: Payer: Self-pay

## 2020-09-15 ENCOUNTER — Emergency Department: Payer: No Typology Code available for payment source

## 2020-09-15 ENCOUNTER — Encounter: Payer: Self-pay | Admitting: Emergency Medicine

## 2020-09-15 ENCOUNTER — Emergency Department
Admission: EM | Admit: 2020-09-15 | Discharge: 2020-09-15 | Disposition: A | Discharge status: Home / Self Care | Payer: No Typology Code available for payment source | Attending: Emergency Medicine | Admitting: Emergency Medicine

## 2020-09-15 DIAGNOSIS — M4716 Other spondylosis with myelopathy, lumbar region: Secondary | ICD-10-CM

## 2020-09-15 DIAGNOSIS — I130 Hypertensive heart and chronic kidney disease with heart failure and stage 1 through stage 4 chronic kidney disease, or unspecified chronic kidney disease: Secondary | ICD-10-CM | POA: Diagnosis not present

## 2020-09-15 DIAGNOSIS — M545 Low back pain, unspecified: Secondary | ICD-10-CM | POA: Diagnosis present

## 2020-09-15 DIAGNOSIS — E1122 Type 2 diabetes mellitus with diabetic chronic kidney disease: Secondary | ICD-10-CM | POA: Diagnosis not present

## 2020-09-15 DIAGNOSIS — N182 Chronic kidney disease, stage 2 (mild): Secondary | ICD-10-CM | POA: Diagnosis not present

## 2020-09-15 DIAGNOSIS — Z7982 Long term (current) use of aspirin: Secondary | ICD-10-CM | POA: Diagnosis not present

## 2020-09-15 DIAGNOSIS — Z87891 Personal history of nicotine dependence: Secondary | ICD-10-CM | POA: Diagnosis not present

## 2020-09-15 DIAGNOSIS — Z7984 Long term (current) use of oral hypoglycemic drugs: Secondary | ICD-10-CM | POA: Diagnosis not present

## 2020-09-15 DIAGNOSIS — Z79899 Other long term (current) drug therapy: Secondary | ICD-10-CM | POA: Insufficient documentation

## 2020-09-15 DIAGNOSIS — I5033 Acute on chronic diastolic (congestive) heart failure: Secondary | ICD-10-CM | POA: Insufficient documentation

## 2020-09-15 DIAGNOSIS — J449 Chronic obstructive pulmonary disease, unspecified: Secondary | ICD-10-CM | POA: Diagnosis not present

## 2020-09-15 LAB — CBC
HCT: 45 % (ref 39.0–52.0)
Hemoglobin: 14.4 g/dL (ref 13.0–17.0)
MCH: 25.2 pg — ABNORMAL LOW (ref 26.0–34.0)
MCHC: 32 g/dL (ref 30.0–36.0)
MCV: 78.7 fL — ABNORMAL LOW (ref 80.0–100.0)
Platelets: 250 10*3/uL (ref 150–400)
RBC: 5.72 MIL/uL (ref 4.22–5.81)
RDW: 18.9 % — ABNORMAL HIGH (ref 11.5–15.5)
WBC: 7.7 10*3/uL (ref 4.0–10.5)
nRBC: 0 % (ref 0.0–0.2)

## 2020-09-15 LAB — URINALYSIS, COMPLETE (UACMP) WITH MICROSCOPIC
Bacteria, UA: NONE SEEN
Bilirubin Urine: NEGATIVE
Glucose, UA: NEGATIVE mg/dL
Hgb urine dipstick: NEGATIVE
Ketones, ur: NEGATIVE mg/dL
Leukocytes,Ua: NEGATIVE
Nitrite: NEGATIVE
Protein, ur: NEGATIVE mg/dL
Specific Gravity, Urine: 1.012 (ref 1.005–1.030)
pH: 5 (ref 5.0–8.0)

## 2020-09-15 LAB — BASIC METABOLIC PANEL
Anion gap: 14 (ref 5–15)
BUN: 21 mg/dL — ABNORMAL HIGH (ref 6–20)
CO2: 29 mmol/L (ref 22–32)
Calcium: 10.5 mg/dL — ABNORMAL HIGH (ref 8.9–10.3)
Chloride: 98 mmol/L (ref 98–111)
Creatinine, Ser: 1.4 mg/dL — ABNORMAL HIGH (ref 0.61–1.24)
GFR, Estimated: >60 mL/min (ref >60)
Glucose, Bld: 155 mg/dL — ABNORMAL HIGH (ref 70–99)
Potassium: 3.9 mmol/L (ref 3.5–5.1)
Sodium: 141 mmol/L (ref 135–145)

## 2020-09-15 MED ORDER — LIDOCAINE 5 % EX PTCH: 1.0000 | MEDICATED_PATCH | Freq: Two times a day (BID) | CUTANEOUS | 0 refills | Status: DC

## 2020-09-15 MED ORDER — LIDOCAINE 5 % EX PTCH
1.0000 | MEDICATED_PATCH | CUTANEOUS | Status: DC
  Administered 2020-09-15: 1 via TRANSDERMAL
  Filled 2020-09-15: qty 1

## 2020-09-15 MED ORDER — MELOXICAM 15 MG PO TABS
15.0000 mg | ORAL_TABLET | Freq: Every day | ORAL | 0 refills | Status: DC
Start: 2020-09-15 — End: 2021-05-07

## 2020-09-15 NOTE — Discharge Instructions (Signed)
Follow-up with your primary care provider at [facility] at [facility].  A Lidoderm patch was applied to your back to help with your pain while in the emergency department.  A prescription for the same was sent to the pharmacy along with a prescription for meloxicam.  This medication is 1 daily with food.  Discontinue taking it if it causes any stomach upset.  You may also use ice or heat to your back as needed for discomfort.

## 2020-09-15 NOTE — ED Provider Notes (Signed)
[facility] Emergency Department Provider Note   ____________________________________________   First MD Initiated Contact with Patient 09/15/20 1435     (approximate)  I have reviewed the triage vital signs and the nursing notes.   HISTORY  Chief Complaint Back Pain   HPI Charles Rangel is a 44 y.o. male presents to the ED with complaint of right mid back pain.  Patient states the last time he had this type of pain he was "dehydrated".  He reports that he has urinary frequency but he takes a fluid pill.  He denies any fever, chills, nausea or vomiting.  He is unaware of any injury to his back and denies prior kidney stone.  Patient continues to ambulate without any assistance and drove himself to the emergency department.  No symptoms of cauda equina in patient's history.  He also is not on any blood thinners at this time.  Not take any over-the-counter medication for his back pain.  He rates pain as 10/10.     Past Medical History:  Diagnosis Date  . (HFpEF) heart failure with preserved ejection fraction (HCC)    a. 02/2018 Echo: EF 65-70%, mildly dil LA.  . CHF (congestive heart failure) (HCC)   . COPD (chronic obstructive pulmonary disease) (HCC)   . Diabetes mellitus without complication (HCC)   . DVT (deep venous thrombosis) (HCC)   . Gout   . Hypertension   . Morbid obesity (HCC)   . OSA (obstructive sleep apnea)     Patient Active Problem List   Diagnosis Date Noted  . GERD (gastroesophageal reflux disease) 05/05/2020  . Acute on chronic respiratory failure with hypoxia (HCC) 05/05/2020  . Acute on chronic diastolic CHF (congestive heart failure) (HCC) 05/05/2020  . Hypokalemia 05/05/2020  . Acute on chronic diastolic (congestive) heart failure (HCC) 05/05/2020  . Acute on chronic respiratory failure with hypoxia and hypercapnia (HCC)   . COPD with acute exacerbation (HCC)   . Pulmonary hypertension, unspecified (HCC)   . CKD stage 2 due  to type 2 diabetes mellitus (HCC)   . Morbid obesity with BMI of 50.0-59.9, adult (HCC) 12/22/2019  . HTN (hypertension) 11/23/2018  . DM (diabetes mellitus) (HCC) 11/23/2018  . Gout 11/16/2018  . CHF (congestive heart failure) (HCC) 11/14/2018  . Acute on chronic heart failure with preserved ejection fraction (HFpEF) (HCC) 06/28/2018    Past Surgical History:  Procedure Laterality Date  . Left leg surgery for fracture    . RIGHT HEART CATH N/A 03/20/2020   Procedure: RIGHT HEART CATH;  Surgeon: Yvonne Kendall, MD;  Location: [facility] INVASIVE CV LAB;  Service: Cardiovascular;  Laterality: N/A;    Prior to Admission medications   Medication Sig Start Date End Date Taking? Authorizing Provider  allopurinol (ZYLOPRIM) 100 MG tablet Take 100 mg by mouth daily.    [provider]  aspirin 81 MG chewable tablet Chew 81 mg by mouth daily.    [provider]  cholecalciferol (VITAMIN D3) 25 MCG (1000 UNIT) tablet Take 1,000 Units by mouth daily.    [provider]  colchicine 0.6 MG tablet Take 0.6 mg by mouth as needed.    [provider]  lidocaine (LIDODERM) 5 % Place 1 patch onto the skin every 12 (twelve) hours. Remove & Discard patch within 12 hours or as directed by MD 09/15/20 09/15/21  Tommi Rumps, PA-C  meloxicam (MOBIC) 15 MG tablet Take 1 tablet (15 mg total) by mouth daily. 09/15/20 09/15/21  Tommi RumpsSummers, Shaelee Forni L, PA-C  metFORMIN (GLUCOPHAGE) 1000 MG tablet Take 1,000 mg by mouth 2 (two) times daily with a meal.    [provider]  Multiple Vitamins-Minerals (MULTIVITAMIN WITH MINERALS) tablet Take 1 tablet by mouth daily.    [provider]  sildenafil (REVATIO) 20 MG tablet Take 20 mg by mouth 3 (three) times daily.    [provider]  torsemide (DEMADEX) 20 MG tablet Take 40 mg by mouth 2 (two) times daily.    [provider]    Allergies Tylenol [acetaminophen], Dog epithelium allergy skin test, Apple, and  Other  Family History  Problem Relation Age of Onset  . Hypertension Mother   . Hypertension Father   . CVA Father [id number]    Social History Social History   Tobacco Use  . Smoking status: Former Smoker    Packs/day: 1.00    Years: 20.00    Pack years: 20.00    Quit date: 10/20/2010    Years since quitting: 9.9  . Smokeless tobacco: Never Used  Vaping Use  . Vaping Use: Never assessed  Substance Use Topics  . Alcohol use: Not Currently  . Drug use: No    Review of Systems Constitutional: No fever/chills Eyes: No visual changes. Cardiovascular: Denies chest pain. Respiratory: Denies shortness of breath. Gastrointestinal: No abdominal pain.  No nausea, no vomiting.  No diarrhea.  No constipation. Genitourinary: Negative for dysuria. Musculoskeletal: Positive for back pain. Skin: Negative for rash. Neurological: Negative for headaches, focal weakness or numbness. Endocrine:  Positive diabetes mellitus Allergic/Immunilogical: Allergic to Tylenol.  ____________________________________________   PHYSICAL EXAM:  VITAL SIGNS: ED Triage Vitals  Enc Vitals Group     BP 09/15/20 1122 (!) 142/70     Pulse Rate 09/15/20 1122 (!) 102     Resp 09/15/20 1122 20     Temp 09/15/20 1122 99 F (37.2 C)     Temp Source 09/15/20 1122 Oral     SpO2 09/15/20 1122 99 %     Weight 09/15/20 1051 (!) 310 lb (140.6 kg)     Height 09/15/20 1051 5\' 6"  (1.676 m)     Head Circumference --      Peak Flow --      Pain Score 09/15/20 1051 10     Pain Loc --      Pain Edu? --      Excl. in GC? --     Constitutional: Alert and oriented. Well appearing and in no acute distress. Eyes: Conjunctivae are normal.  Head: Atraumatic. Nose: No congestion/rhinnorhea. Neck: No stridor.   Cardiovascular: Normal rate, regular rhythm. Grossly normal heart sounds.  Good peripheral circulation. Respiratory: Normal respiratory effort.  No retractions. Lungs CTAB. Gastrointestinal: Soft and nontender. No  distention.  No CVA tenderness. Musculoskeletal: No point tenderness is noted on palpation of the thoracic spine.  There is some tenderness on palpation of the L5-S1 area with radiation to the right paravertebral muscles.  Range of motion is slow and guarded secondary to discomfort.  Good muscle strength bilaterally.  Patient is able to ambulate without assistance.  Patient points to his right SI joint area when discussing his "kidney pain". Neurologic:  Normal speech and language. No gross focal neurologic deficits are appreciated. No gait instability. Skin:  Skin is warm, dry and intact. No rash noted. Psychiatric: Mood and affect are normal. Speech and behavior are normal.  ____________________________________________   LABS (all labs ordered are listed, but only abnormal results are displayed)  Labs Reviewed  BASIC METABOLIC PANEL - Abnormal; Notable for the following components:      Result Value   Glucose, Bld 155 (*)    BUN 21 (*)    Creatinine, Ser 1.40 (*)    Calcium 10.5 (*)    All other components within normal limits  CBC - Abnormal; Notable for the following components:   MCV 78.7 (*)    MCH 25.2 (*)    RDW 18.9 (*)    All other components within normal limits  URINALYSIS, COMPLETE (UACMP) WITH MICROSCOPIC - Abnormal; Notable for the following components:   Color, Urine YELLOW (*)    APPearance CLEAR (*)    All other components within normal limits    RADIOLOGY I, Tommi Rumps, personally viewed and evaluated these images (plain radiographs) as part of my medical decision making, as well as reviewing the written report by the radiologist.   Official radiology report(s): DG Lumbar Spine 2-3 Views  Result Date: 09/15/2020 CLINICAL DATA:  Low back pain without known injury. EXAM: LUMBAR SPINE - 2-3 VIEW COMPARISON:  None. FINDINGS: There is no evidence of lumbar spine fracture. Alignment is normal. Osteoarthritic changes in the lower thoracic spine and  thoracolumbar junction, as well as at L5-S1, with associated posterior facet arthropathy. IMPRESSION: Osteoarthritic changes in the lower thoracic spine and thoracolumbar junction, as well as at L5-S1, with associated posterior facet arthropathy. Electronically Signed   By: Ted Mcalpine M.D.   On: 09/15/2020 15:27    ____________________________________________   PROCEDURES  Procedure(s) performed (including Critical Care):  Procedures   ____________________________________________   INITIAL IMPRESSION / ASSESSMENT AND PLAN / ED COURSE  As part of my medical decision making, I reviewed the following data within the electronic MEDICAL RECORD NUMBER Notes from prior ED visits and [place] Controlled Substance Database  44 year old male presents to the ED with complaint of right back pain without history of injury.  Patient originally thought that he was having problems with his kidneys and denies any recent injury to his back.  On exam patient's pain actually is tender approximately L5-S1 area on palpation of the lumbar spine and over to the SI joint area.  Patient has slow guarded movement secondary to discomfort.  No signs of cauda equina or symptoms noted.  After x-ray patient was made aware of his x-ray findings and the osteoarthritis that he has in his lumbar spine.  A Lidoderm patch was applied to the area and a prescription for meloxicam 15 mg 1 daily with food was prescribed for him as well as additional Lidoderm patches.  Patient prefers to follow-up with an orthopedist at the [facility] in [place] for any continued problems with his back. ____________________________________________   FINAL CLINICAL IMPRESSION(S) / ED DIAGNOSES  Final diagnoses:  Osteoarthritis of lumbar spine with myelopathy     ED Discharge Orders         Ordered    lidocaine (LIDODERM) 5 %  Every 12 hours        09/15/20 1542    meloxicam (MOBIC) 15 MG tablet  Daily        09/15/20 1542           *Please note:  Kadin Canipe was evaluated in Emergency Department on 09/15/2020 for the symptoms described in the history of present illness. He was evaluated in the context of the global COVID-19 pandemic, which necessitated consideration that the patient might be at risk for infection with the SARS-CoV-2 virus that causes  COVID-19. Institutional protocols and algorithms that pertain to the evaluation of patients at risk for COVID-19 are in a state of rapid change based on information released by regulatory bodies including the CDC and federal and state organizations. These policies and algorithms were followed during the patient's care in the ED.  Some ED evaluations and interventions may be delayed as a result of limited staffing during and the pandemic.*   Note:  This document was prepared using Dragon voice recognition software and may include unintentional dictation errors.    Tommi Rumps, PA-C 09/15/20 1625    Jene Every, MD 09/16/20 [phone]

## 2020-09-15 NOTE — ED Triage Notes (Signed)
Pt reports pain to his right mid back. Pt states has had this pain before and it was his kidney and he was dehydrated. Pt reports he has urinary frequency but states that he always urinates a lot.

## 2020-11-04 IMAGING — DX DG CHEST 1V PORT
1 series · 1 of 1 positions shown · non-contrast
Comparison: 05/05/2020

CLINICAL DATA: Status post intubation

EXAM:
PORTABLE CHEST 1 VIEW

[chest ap]
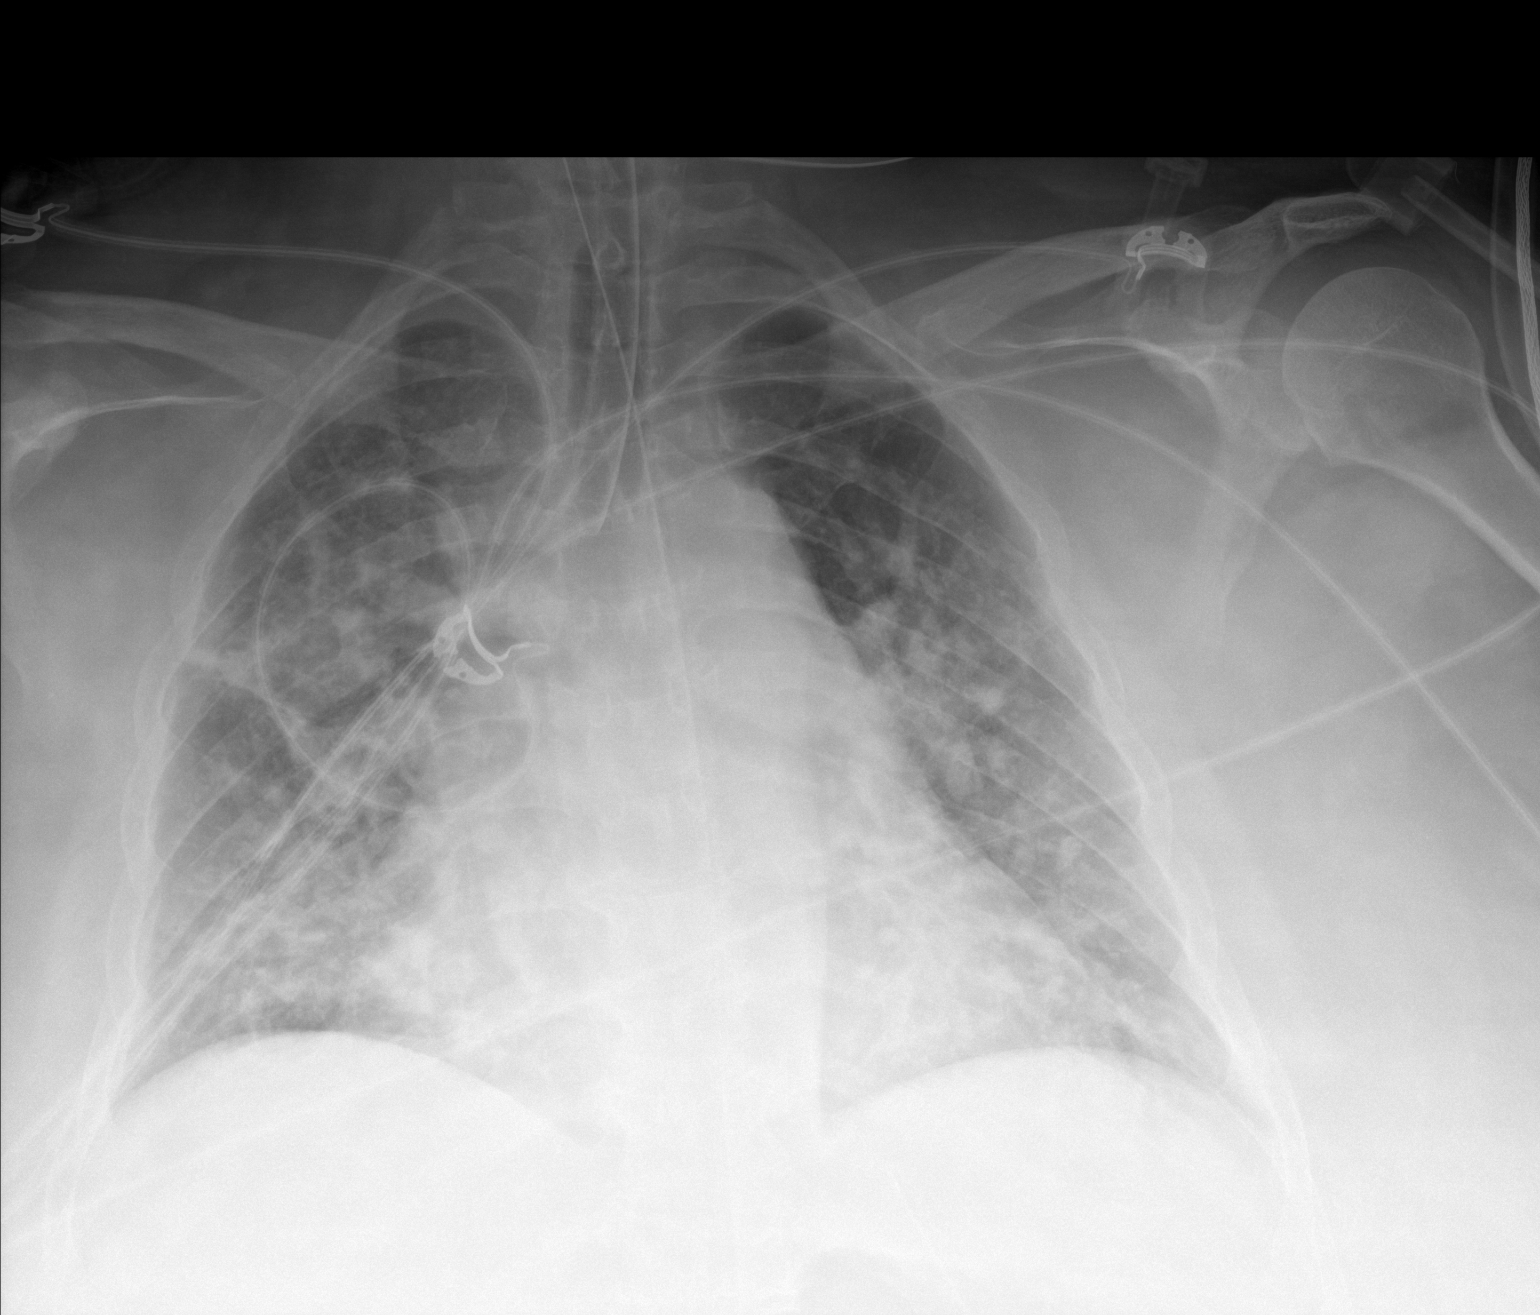

[1 of 1 positions shown; findings below may reference images not displayed]

FINDINGS: Endotracheal tube is been placed, 3.8 cm above the carina. Pulmonary
insufflation is stable and the lungs are well expanded and are
symmetric. Superimposed mid and lower lung zone interstitial
pulmonary infiltrate and central pulmonary vascular congestion, in
keeping with changes of mild to moderate cardiogenic failure, appear
stable. Mild development of band like atelectasis within the mid
right lung zone. No pneumothorax or pleural effusion. Mild
cardiomegaly is stable. No acute bone abnormality. Nasogastric tube
extends into the upper abdomen and loops into the gastric fundus.
IMPRESSION: Endotracheal tube and nasogastric tube in appropriate position.

Preserved pulmonary insufflation.

Stable central pulmonary vascular congestion and superimposed
interstitial pulmonary infiltrate most in keeping with mild to
moderate cardiogenic failure.

## 2021-05-07 ENCOUNTER — Emergency Department: Payer: No Typology Code available for payment source

## 2021-05-07 ENCOUNTER — Inpatient Hospital Stay
Admission: EM | Admit: 2021-05-07 | Discharge: 2021-05-10 | DRG: 190 | Disposition: A | Discharge status: Home / Self Care | Payer: No Typology Code available for payment source | Attending: Student | Admitting: Student

## 2021-05-07 ENCOUNTER — Other Ambulatory Visit: Payer: Self-pay

## 2021-05-07 DIAGNOSIS — I110 Hypertensive heart disease with heart failure: Secondary | ICD-10-CM | POA: Diagnosis present

## 2021-05-07 DIAGNOSIS — I10 Essential (primary) hypertension: Secondary | ICD-10-CM | POA: Diagnosis present

## 2021-05-07 DIAGNOSIS — Z791 Long term (current) use of non-steroidal anti-inflammatories (NSAID): Secondary | ICD-10-CM

## 2021-05-07 DIAGNOSIS — J9691 Respiratory failure, unspecified with hypoxia: Secondary | ICD-10-CM | POA: Diagnosis present

## 2021-05-07 DIAGNOSIS — I509 Heart failure, unspecified: Secondary | ICD-10-CM

## 2021-05-07 DIAGNOSIS — Z7984 Long term (current) use of oral hypoglycemic drugs: Secondary | ICD-10-CM

## 2021-05-07 DIAGNOSIS — Z20822 Contact with and (suspected) exposure to covid-19: Secondary | ICD-10-CM | POA: Diagnosis present

## 2021-05-07 DIAGNOSIS — T501X5A Adverse effect of loop [high-ceiling] diuretics, initial encounter: Secondary | ICD-10-CM | POA: Diagnosis not present

## 2021-05-07 DIAGNOSIS — K219 Gastro-esophageal reflux disease without esophagitis: Secondary | ICD-10-CM | POA: Diagnosis present

## 2021-05-07 DIAGNOSIS — Z6841 Body Mass Index (BMI) 40.0 and over, adult: Secondary | ICD-10-CM

## 2021-05-07 DIAGNOSIS — J440 Chronic obstructive pulmonary disease with acute lower respiratory infection: Principal | ICD-10-CM | POA: Diagnosis present

## 2021-05-07 DIAGNOSIS — Z79899 Other long term (current) drug therapy: Secondary | ICD-10-CM

## 2021-05-07 DIAGNOSIS — R0602 Shortness of breath: Secondary | ICD-10-CM | POA: Diagnosis not present

## 2021-05-07 DIAGNOSIS — I5032 Chronic diastolic (congestive) heart failure: Secondary | ICD-10-CM | POA: Diagnosis present

## 2021-05-07 DIAGNOSIS — J40 Bronchitis, not specified as acute or chronic: Secondary | ICD-10-CM

## 2021-05-07 DIAGNOSIS — G4733 Obstructive sleep apnea (adult) (pediatric): Secondary | ICD-10-CM | POA: Diagnosis present

## 2021-05-07 DIAGNOSIS — I272 Pulmonary hypertension, unspecified: Secondary | ICD-10-CM | POA: Diagnosis present

## 2021-05-07 DIAGNOSIS — F17210 Nicotine dependence, cigarettes, uncomplicated: Secondary | ICD-10-CM | POA: Diagnosis present

## 2021-05-07 DIAGNOSIS — Z91018 Allergy to other foods: Secondary | ICD-10-CM

## 2021-05-07 DIAGNOSIS — J9601 Acute respiratory failure with hypoxia: Secondary | ICD-10-CM

## 2021-05-07 DIAGNOSIS — Z7982 Long term (current) use of aspirin: Secondary | ICD-10-CM

## 2021-05-07 DIAGNOSIS — M109 Gout, unspecified: Secondary | ICD-10-CM | POA: Diagnosis present

## 2021-05-07 DIAGNOSIS — Z9109 Other allergy status, other than to drugs and biological substances: Secondary | ICD-10-CM

## 2021-05-07 DIAGNOSIS — E119 Type 2 diabetes mellitus without complications: Secondary | ICD-10-CM | POA: Diagnosis present

## 2021-05-07 DIAGNOSIS — J208 Acute bronchitis due to other specified organisms: Secondary | ICD-10-CM | POA: Diagnosis present

## 2021-05-07 DIAGNOSIS — E876 Hypokalemia: Secondary | ICD-10-CM | POA: Diagnosis not present

## 2021-05-07 DIAGNOSIS — Z886 Allergy status to analgesic agent status: Secondary | ICD-10-CM

## 2021-05-07 DIAGNOSIS — E66813 Obesity, class 3: Secondary | ICD-10-CM

## 2021-05-07 DIAGNOSIS — Z8249 Family history of ischemic heart disease and other diseases of the circulatory system: Secondary | ICD-10-CM

## 2021-05-07 DIAGNOSIS — J9621 Acute and chronic respiratory failure with hypoxia: Secondary | ICD-10-CM | POA: Diagnosis present

## 2021-05-07 LAB — BASIC METABOLIC PANEL
Anion gap: 13 (ref 5–15)
BUN: 14 mg/dL (ref 6–20)
CO2: 32 mmol/L (ref 22–32)
Calcium: 9.8 mg/dL (ref 8.9–10.3)
Chloride: 94 mmol/L — ABNORMAL LOW (ref 98–111)
Creatinine, Ser: 1.35 mg/dL — ABNORMAL HIGH (ref 0.61–1.24)
GFR, Estimated: >60 mL/min (ref >60)
Glucose, Bld: 186 mg/dL — ABNORMAL HIGH (ref 70–99)
Potassium: 3.1 mmol/L — ABNORMAL LOW (ref 3.5–5.1)
Sodium: 139 mmol/L (ref 135–145)

## 2021-05-07 LAB — CBC
HCT: 40.4 % (ref 39.0–52.0)
Hemoglobin: 13.6 g/dL (ref 13.0–17.0)
MCH: 26.5 pg (ref 26.0–34.0)
MCHC: 33.7 g/dL (ref 30.0–36.0)
MCV: 78.8 fL — ABNORMAL LOW (ref 80.0–100.0)
Platelets: 259 10*3/uL (ref 150–400)
RBC: 5.13 MIL/uL (ref 4.22–5.81)
RDW: 16.2 % — ABNORMAL HIGH (ref 11.5–15.5)
WBC: 8.9 10*3/uL (ref 4.0–10.5)
nRBC: 0 % (ref 0.0–0.2)

## 2021-05-07 LAB — TROPONIN I (HIGH SENSITIVITY)
Troponin I (High Sensitivity): 11 ng/L (ref <18)
Troponin I (High Sensitivity): 11 ng/L (ref <18)

## 2021-05-07 LAB — GROUP A STREP BY PCR: Group A Strep by PCR: NOT DETECTED

## 2021-05-07 LAB — MAGNESIUM: Magnesium: 1.9 mg/dL (ref 1.7–2.4)

## 2021-05-07 LAB — D-DIMER, QUANTITATIVE: D-Dimer, Quant: 0.3 ug/mL-FEU (ref 0.00–0.50)

## 2021-05-07 LAB — RESP PANEL BY RT-PCR (FLU A&B, COVID) ARPGX2
Influenza A by PCR: NEGATIVE
Influenza B by PCR: NEGATIVE
SARS Coronavirus 2 by RT PCR: NEGATIVE

## 2021-05-07 LAB — BRAIN NATRIURETIC PEPTIDE: B Natriuretic Peptide: 30.3 pg/mL (ref 0.0–100.0)

## 2021-05-07 LAB — HIV ANTIBODY (ROUTINE TESTING W REFLEX): HIV Screen 4th Generation wRfx: NONREACTIVE

## 2021-05-07 LAB — PROCALCITONIN: Procalcitonin: 0.33 ng/mL

## 2021-05-07 LAB — GLUCOSE, CAPILLARY: Glucose-Capillary: 270 mg/dL — ABNORMAL HIGH (ref 70–99)

## 2021-05-07 MED ORDER — PANTOPRAZOLE SODIUM 40 MG PO TBEC
40.0000 mg | DELAYED_RELEASE_TABLET | Freq: Every day | ORAL | Status: DC
  Administered 2021-05-08 – 2021-05-10 (×3): 40 mg via ORAL
  Filled 2021-05-07 (×3): qty 1

## 2021-05-07 MED ORDER — ASPIRIN 81 MG PO CHEW
81.0000 mg | CHEWABLE_TABLET | Freq: Every day | ORAL | Status: DC
  Administered 2021-05-07 – 2021-05-10 (×4): 81 mg via ORAL
  Filled 2021-05-07 (×4): qty 1

## 2021-05-07 MED ORDER — INSULIN ASPART 100 UNIT/ML IJ SOLN
0.0000 [IU] | Freq: Every day | INTRAMUSCULAR | Status: DC
  Administered 2021-05-07 – 2021-05-09 (×3): 3 [IU] via SUBCUTANEOUS
  Filled 2021-05-07 (×3): qty 1

## 2021-05-07 MED ORDER — ENOXAPARIN SODIUM 80 MG/0.8ML IJ SOSY
0.5000 mg/kg | PREFILLED_SYRINGE | INTRAMUSCULAR | Status: DC
  Administered 2021-05-07 – 2021-05-09 (×3): 70 mg via SUBCUTANEOUS
  Filled 2021-05-07 (×4): qty 0.7

## 2021-05-07 MED ORDER — VITAMIN D 25 MCG (1000 UNIT) PO TABS
1000.0000 [IU] | ORAL_TABLET | Freq: Every day | ORAL | Status: DC
  Administered 2021-05-08 – 2021-05-10 (×3): 1000 [IU] via ORAL
  Filled 2021-05-07 (×3): qty 1

## 2021-05-07 MED ORDER — COLCHICINE 0.6 MG PO TABS
0.6000 mg | ORAL_TABLET | ORAL | Status: DC | PRN
  Filled 2021-05-07: qty 1

## 2021-05-07 MED ORDER — ONDANSETRON HCL 4 MG/2ML IJ SOLN: 4.0000 mg | Freq: Four times a day (QID) | INTRAMUSCULAR | Status: DC | PRN

## 2021-05-07 MED ORDER — METFORMIN HCL 500 MG PO TABS
1000.0000 mg | ORAL_TABLET | Freq: Two times a day (BID) | ORAL | Status: DC
  Filled 2021-05-07 (×2): qty 2

## 2021-05-07 MED ORDER — ONDANSETRON HCL 4 MG PO TABS: 4.0000 mg | ORAL_TABLET | Freq: Four times a day (QID) | ORAL | Status: DC | PRN

## 2021-05-07 MED ORDER — HYDRALAZINE HCL 10 MG PO TABS
10.0000 mg | ORAL_TABLET | Freq: Four times a day (QID) | ORAL | Status: AC | PRN
  Filled 2021-05-07: qty 1

## 2021-05-07 MED ORDER — FLUTICASONE PROPIONATE 50 MCG/ACT NA SUSP
2.0000 | Freq: Two times a day (BID) | NASAL | Status: DC | PRN
  Filled 2021-05-07: qty 16

## 2021-05-07 MED ORDER — LISINOPRIL 5 MG PO TABS
5.0000 mg | ORAL_TABLET | Freq: Every day | ORAL | Status: DC
  Administered 2021-05-08 – 2021-05-10 (×3): 5 mg via ORAL
  Filled 2021-05-07 (×3): qty 1

## 2021-05-07 MED ORDER — POTASSIUM CHLORIDE CRYS ER 20 MEQ PO TBCR
40.0000 meq | EXTENDED_RELEASE_TABLET | Freq: Once | ORAL | Status: AC
  Administered 2021-05-07: 40 meq via ORAL
  Filled 2021-05-07: qty 2

## 2021-05-07 MED ORDER — INSULIN ASPART 100 UNIT/ML IJ SOLN
0.0000 [IU] | Freq: Three times a day (TID) | INTRAMUSCULAR | Status: DC
  Administered 2021-05-08: 2 [IU] via SUBCUTANEOUS
  Administered 2021-05-08: 14:00:00 3 [IU] via SUBCUTANEOUS
  Administered 2021-05-08: 10:00:00 8 [IU] via SUBCUTANEOUS
  Administered 2021-05-09: 3 [IU] via SUBCUTANEOUS
  Administered 2021-05-09: 17:00:00 15 [IU] via SUBCUTANEOUS
  Administered 2021-05-09: 12:00:00 5 [IU] via SUBCUTANEOUS
  Administered 2021-05-10: 13:00:00 7 [IU] via SUBCUTANEOUS
  Administered 2021-05-10: 3 [IU] via SUBCUTANEOUS
  Filled 2021-05-07 (×8): qty 1

## 2021-05-07 MED ORDER — ALLOPURINOL 100 MG PO TABS
100.0000 mg | ORAL_TABLET | Freq: Every day | ORAL | Status: DC
  Administered 2021-05-07 – 2021-05-10 (×4): 100 mg via ORAL
  Filled 2021-05-07 (×5): qty 1

## 2021-05-07 MED ORDER — FUROSEMIDE 10 MG/ML IJ SOLN
40.0000 mg | Freq: Once | INTRAMUSCULAR | Status: AC
  Administered 2021-05-07: 40 mg via INTRAVENOUS
  Filled 2021-05-07: qty 4

## 2021-05-07 MED ORDER — METOPROLOL SUCCINATE ER 50 MG PO TB24
100.0000 mg | ORAL_TABLET | Freq: Every day | ORAL | Status: DC
  Administered 2021-05-07 – 2021-05-10 (×4): 100 mg via ORAL
  Filled 2021-05-07 (×4): qty 2

## 2021-05-07 MED ORDER — IBUPROFEN 400 MG PO TABS
600.0000 mg | ORAL_TABLET | Freq: Four times a day (QID) | ORAL | Status: DC | PRN
  Administered 2021-05-07: 21:00:00 600 mg via ORAL
  Filled 2021-05-07 (×2): qty 2

## 2021-05-07 MED ORDER — SODIUM CHLORIDE 0.9 % IV SOLN
500.0000 mg | INTRAVENOUS | Status: AC
  Administered 2021-05-07 – 2021-05-09 (×3): 500 mg via INTRAVENOUS
  Filled 2021-05-07 (×4): qty 500

## 2021-05-07 NOTE — H&P (Signed)
History and Physical   Charles Rangel ELT:[id number] DOB: 1975-11-30 DOA: 05/07/2021  [facility]P: [facility]  Patient coming from: home   I have personally briefly reviewed patient's old medical records in [facility] EMR.  Chief Concern: Shortness of breath and cough  HPI: Charles Rangel is a 45 y.o. male with medical history significant for obesity, hypertension, non-insulin-dependent diabetes mellitus, obstructive sleep apnea is compliant with CPAP, presents to the emergency department for chief concerns of shortness of breath.  He endorses that he has had shortness of breath since Thursday, 05/02/2021.  He states has been gradually worsening.  He endorses associated white phlegm productive cough.   He endorses nausea.  He denies fever, sick contacts, vomiting, diarrhea, chest pain, abdominal pain, dysuria, hematuria, syncope, lost of consciousne. Last BM was AM of 05/07/21 was well formed, brown.   He endorses compliance with his cpap use. At baseline patient does not wear home oxygen.  Social history: He lives at home with his wife, her sister and husbands. He smokes 1 cigarettes per day. He denies etoh, recreational drugs. He works as a Hydrologist, selling CPAP machines.   Vaccination history: He is vaccinated for covid 19, two doses of Pfizer  ROS: Constitutional: no weight change, no fever ENT/Mouth: + sore throat, no rhinorrhea, + congestion  Eyes: no eye pain, no vision changes Cardiovascular: no chest pain, + dyspnea,  no edema, no palpitations Respiratory: + cough, + sputum, no wheezing Gastrointestinal: + nausea, no vomiting, no diarrhea, no constipation Genitourinary: no urinary incontinence, no dysuria, no hematuria Musculoskeletal: no arthralgias, no myalgias Skin: no skin lesions, no pruritus, Neuro: + weakness, no loss of consciousness, no syncope Psych: no anxiety, no depression, + decrease appetite Heme/Lymph: no bruising, no bleeding  ED Course:  Discussed with emergency medicine provider, patient requiring hospitalization for acute respiratory failure.  Vitals in the emergency department was remarkable for temperature of 98.8, respiration rate of 19, blood pressure 139/96, SPO2 of 93% on 2 L nasal cannula respiration rate of 123-118.  Labs in the emergency department was remarkable for sodium 139, potassium 3.1, chloride 94, bicarb 32, BUN 14, serum creatinine of 1.35, nonfasting blood glucose 182, troponin 11, BNP 30.5, procalcitonin 0.33, COVID PCR was negative  Emergency medicine provider ordered Lasix 40 mg IV, potassium Chloride 40 mill equivalent p.o.  Patient desatted to 88% on room air with ambulation.    Assessment/Plan  Principal Problem:   Acute hypoxemic respiratory failure (HCC) Active Problems:   Gout   HTN (hypertension)   DM (diabetes mellitus) (HCC)   Pulmonary hypertension, unspecified (HCC)   GERD (gastroesophageal reflux disease)   Acute on chronic respiratory failure with hypoxia (HCC)   Hypokalemia   Obesity, Class III, BMI 40-49.9 (morbid obesity) (HCC)   OSA (obstructive sleep apnea)   # Acute hypoxemic respiratory failure suspect secondary to viral bronchitis - Procalcitonin was mildly elevated at 0.33 - Azithromycin 500 mg IV, 3 days ordered - Nasal cannula as needed to maintain SPO2 greater than 92% - Admit to MedSurg, observation, no telemetry  # Hypertension-resumed metoprolol succinate 100 mg daily today, I, lisinopril 5 mg daily for 05/08/2021 - Hydralazine 10 mg every 6 hours as needed for SBP greater than 180  # Non-insulin-dependent diabetes mellitus-metformin 1000 mg p.o. twice daily with meals resumed -Insulin SSI with at bedtime coverage ordered  # Hypokalemia-status post 40 mEq potassium chloride p.o.  # Pulmonary hypertension-CPAP nightly ordered # OSA-CPAP nightly ordered # GERD-PPI resumed # Gout-in  remission; resumed home allopurinol 100 mg daily, colchicine.  # Morbid  obesity - counseled patient to make an attempt to improve diet and exercise via replacing one side per day with broccoli, green beans, spinach, cauliflower; walk for 30 minutes a day in a safe area when the temperature is not too hot or too cold  Chart reviewed.   DVT prophylaxis: Enoxaparin weight-based, every 24 hours Code Status: Full code Diet: Heart healthy/carb modified Family Communication: No Disposition Plan: Pending clinical course Consults called: None at this time Admission status: MedSurg, observation, no telemetry ordered  Past Medical History:  Diagnosis Date   (HFpEF) heart failure with preserved ejection fraction (HCC)    a. 02/2018 Echo: EF 65-70%, mildly dil LA.   CHF (congestive heart failure) (HCC)    COPD (chronic obstructive pulmonary disease) (HCC)    Diabetes mellitus without complication (HCC)    DVT (deep venous thrombosis) (HCC)    Gout    Hypertension    Morbid obesity (HCC)    OSA (obstructive sleep apnea)    Past Surgical History:  Procedure Laterality Date   Left leg surgery for fracture     RIGHT HEART CATH N/A 03/20/2020   Procedure: RIGHT HEART CATH;  Surgeon: Yvonne Kendall, MD;  Location: [facility] INVASIVE CV LAB;  Service: Cardiovascular;  Laterality: N/A;   Social History:  reports that he quit smoking about 10 years ago. He has a 20.00 pack-year smoking history. He has never used smokeless tobacco. He reports previous alcohol use. He reports that he does not use drugs.  Allergies  Allergen Reactions   Tylenol [Acetaminophen] Anaphylaxis   Dog Epithelium Allergy Skin Test Hives   Apple Hives   Other Hives    RAISINS   Family History  Problem Relation Age of Onset   Hypertension Mother    Hypertension Father    CVA Father 52   Family history: Family history reviewed and not pertinent  Prior to Admission medications   Medication Sig Start Date End Date Taking? Authorizing Provider  allopurinol (ZYLOPRIM) 100 MG tablet Take 100 mg by  mouth daily.    [provider]  aspirin 81 MG chewable tablet Chew 81 mg by mouth daily.    [provider]  cholecalciferol (VITAMIN D3) 25 MCG (1000 UNIT) tablet Take 1,000 Units by mouth daily.    [provider]  colchicine 0.6 MG tablet Take 0.6 mg by mouth as needed.    [provider]  lidocaine (LIDODERM) 5 % Place 1 patch onto the skin every 12 (twelve) hours. Remove & Discard patch within 12 hours or as directed by MD 09/15/20 09/15/21  Tommi Rumps, PA-C  meloxicam (MOBIC) 15 MG tablet Take 1 tablet (15 mg total) by mouth daily. 09/15/20 09/15/21  Tommi Rumps, PA-C  metFORMIN (GLUCOPHAGE) 1000 MG tablet Take 1,000 mg by mouth 2 (two) times daily with a meal.    [provider]  Multiple Vitamins-Minerals (MULTIVITAMIN WITH MINERALS) tablet Take 1 tablet by mouth daily.    [provider]  sildenafil (REVATIO) 20 MG tablet Take 20 mg by mouth 3 (three) times daily.    [provider]  torsemide (DEMADEX) 20 MG tablet Take 40 mg by mouth 2 (two) times daily.    [provider]   Physical Exam: Vitals:   05/07/21 1354 05/07/21 1435 05/07/21 1552  BP: (!) 139/96  (!) 141/86  Pulse: (!) 123  (!) 118  Resp: 19  (!) 22  Temp: 98.8 F (37.1 C)    SpO2: 93%  92%  Weight:  (!) 140 kg   Height:  5\' 6"  (1.676 m)    Constitutional: appears older than chronological age, NAD, calm, comfortable Eyes: PERRL, lids and conjunctivae normal ENMT: Mucous membranes are moist. Posterior pharynx clear of any exudate or lesions. Age-appropriate dentition. Hearing appropriate Neck: normal, supple, no masses, no thyromegaly Respiratory: clear to auscultation bilaterally, no wheezing, no crackles. Normal respiratory effort. No accessory muscle use.  Cardiovascular: Regular rate and rhythm, no murmurs / rubs / gallops. No extremity edema. 2+ pedal pulses. No carotid bruits.  Abdomen: Morbidly obese abdomen, no tenderness,  no masses palpated, no hepatosplenomegaly. Bowel sounds positive.  Musculoskeletal: no clubbing / cyanosis. No joint deformity upper and lower extremities. Good ROM, no contractures, no atrophy. Normal muscle tone.  Skin: no rashes, lesions, ulcers. No induration Neurologic: Sensation intact. Strength 5/5 in all 4.  Psychiatric: Normal judgment and insight. Alert and oriented x 3. Normal mood.   EKG: independently reviewed, showing sinus tachycardia with rate of 124, QTc 479  Chest x-ray on Admission: I personally reviewed and I agree with radiologist reading as below.  DG Chest 2 View  Result Date: 05/07/2021 CLINICAL DATA:  Shortness of breath. Congestion and cough. Runny nose. EXAM: CHEST - 2 VIEW COMPARISON:  Chest radiograph 05/07/2020, chest CT 12/22/2019 FINDINGS: Borderline cardiomegaly. Stable mediastinal contours. Vascular congestion. Heterogeneous opacities in the lung bases that may in part be chronic and secondary to mosaic attenuation is seen on prior CT. No pleural effusion or pneumothorax. Chronic wedging of vertebra at the thoracolumbar junction. No acute osseous abnormalities are seen. IMPRESSION: Borderline cardiomegaly with vascular congestion. Heterogeneous opacities in the lung bases may in part be chronic and secondary to mosaic attenuation as seen on prior CT. Overall appearance is similar to prior exam Electronically Signed   By: 02/21/2020 M.D.   On: 05/07/2021 14:57    Labs on Admission: I have personally reviewed following labs  CBC: Recent Labs  Lab 05/07/21 1341  WBC 8.9  HGB 13.6  HCT 40.4  MCV 78.8*  PLT 259   Basic Metabolic Panel: Recent Labs  Lab 05/07/21 1341  NA 139  K 3.1*  CL 94*  CO2 32  GLUCOSE 186*  BUN 14  CREATININE 1.35*  CALCIUM 9.8  MG 1.9   GFR: Estimated Creatinine Clearance: 91.2 mL/min (A) (by C-G formula based on SCr of 1.35 mg/dL (H)).  Urine analysis:    Component Value Date/Time   COLORURINE YELLOW (A)  09/15/2020 1122   APPEARANCEUR CLEAR (A) 09/15/2020 1122   APPEARANCEUR Clear 07/28/2014 2317   LABSPEC 1.012 09/15/2020 1122   LABSPEC 1.024 07/28/2014 2317   PHURINE 5.0 09/15/2020 1122   GLUCOSEU NEGATIVE 09/15/2020 1122   GLUCOSEU Negative 07/28/2014 2317   HGBUR NEGATIVE 09/15/2020 1122   BILIRUBINUR NEGATIVE 09/15/2020 1122   BILIRUBINUR Negative 07/28/2014 2317   KETONESUR NEGATIVE 09/15/2020 1122   PROTEINUR NEGATIVE 09/15/2020 1122   NITRITE NEGATIVE 09/15/2020 1122   LEUKOCYTESUR NEGATIVE 09/15/2020 1122   LEUKOCYTESUR Negative 07/28/2014 2317   Dr. 09/27/2014 [facility]  If 7PM-7AM, please contact overnight-coverage provider If 7AM-7PM, please contact day coverage provider [email or web address]  05/07/2021, 4:32 PM

## 2021-05-07 NOTE — Progress Notes (Signed)
PHARMACIST - PHYSICIAN COMMUNICATION  CONCERNING:  Enoxaparin (Lovenox) for DVT Prophylaxis    RECOMMENDATION: Patient was prescribed enoxaprin 40mg  q24 hours for VTE prophylaxis.   Filed Weights   05/07/21 1435  Weight: (!) 140 kg (308 lb 10.3 oz)    Body mass index is 49.82 kg/m.  Estimated Creatinine Clearance: 91.2 mL/min (A) (by C-G formula based on SCr of 1.35 mg/dL (H)).   Based on [facility] policy patient is candidate for enoxaparin 0.5mg /kg TBW SQ every 24 hours based on BMI being >30.  DESCRIPTION: Pharmacy has adjusted enoxaparin dose per [facility] policy.  Patient is now receiving enoxaparin 70 mg every 24 hours    [facility], PharmD Clinical Pharmacist  05/07/2021 4:26 PM

## 2021-05-07 NOTE — ED Notes (Signed)
Pt states coming in due to a cough and what he thought was sinus congestion this past Wednesday. Pt states over the weekend he started to have LUQ abdominal pain. Pt states he knows it was a sinus problem due to pain behind his eyes.

## 2021-05-07 NOTE — ED Notes (Signed)
Patient ambulated in room- O2 dropped to 88% on room air. Patient placed on 2L at this time.

## 2021-05-07 NOTE — ED Provider Notes (Signed)
[facility] Emergency Department Provider Note  ____________________________________________   Event Date/Time   First MD Initiated Contact with Patient 05/07/21 1431     (approximate)  I have reviewed the triage vital signs and the nursing notes.   HISTORY  Chief Complaint Cough   HPI Charles Rangel is a 45 y.o. male with a past medical history of CHF on daily Lasix, COPD, DM, gout, HTN, obesity, OSA and and remote DVT not anticoagulated who presents for assessment approximately 3 to 4 days of gradually worsening congestion, cough, pressure in head, chest pain and general malaise.  Patient denies any earache, sore throat, hemoptysis, abdominal pain, nausea, vomiting, diarrhea, dysuria, rash or focal extremity weakness numbness tingling.  He does endorse orthopnea not sure if he has gained couple pounds over the last couple days or not.  Denies tobacco EtOH or illicit drug use.  No other acute concerns at this time.         Past Medical History:  Diagnosis Date   (HFpEF) heart failure with preserved ejection fraction (HCC)    a. 02/2018 Echo: EF 65-70%, mildly dil LA.   CHF (congestive heart failure) (HCC)    COPD (chronic obstructive pulmonary disease) (HCC)    Diabetes mellitus without complication (HCC)    DVT (deep venous thrombosis) (HCC)    Gout    Hypertension    Morbid obesity (HCC)    OSA (obstructive sleep apnea)     Patient Active Problem List   Diagnosis Date Noted   Acute hypoxemic respiratory failure (HCC) 05/07/2021   Obesity, Class III, BMI 40-49.9 (morbid obesity) (HCC) 05/07/2021   OSA (obstructive sleep apnea) 05/07/2021   GERD (gastroesophageal reflux disease) 05/05/2020   Acute on chronic respiratory failure with hypoxia (HCC) 05/05/2020   Acute on chronic diastolic CHF (congestive heart failure) (HCC) 05/05/2020   Hypokalemia 05/05/2020   Acute on chronic diastolic (congestive) heart failure (HCC) 05/05/2020   Acute on  chronic respiratory failure with hypoxia and hypercapnia (HCC)    COPD with acute exacerbation (HCC)    Pulmonary hypertension, unspecified (HCC)    CKD stage 2 due to type 2 diabetes mellitus (HCC)    Morbid obesity with BMI of 50.0-59.9, adult (HCC) 12/22/2019   HTN (hypertension) 11/23/2018   DM (diabetes mellitus) (HCC) 11/23/2018   Gout 11/16/2018   CHF (congestive heart failure) (HCC) 11/14/2018   Acute on chronic heart failure with preserved ejection fraction (HFpEF) (HCC) 06/28/2018    Past Surgical History:  Procedure Laterality Date   Left leg surgery for fracture     RIGHT HEART CATH N/A 03/20/2020   Procedure: RIGHT HEART CATH;  Surgeon: Yvonne Kendall, MD;  Location: [facility] INVASIVE CV LAB;  Service: Cardiovascular;  Laterality: N/A;    Prior to Admission medications   Medication Sig Start Date End Date Taking? Authorizing Provider  allopurinol (ZYLOPRIM) 100 MG tablet Take 100 mg by mouth daily.    [provider]  aspirin 81 MG chewable tablet Chew 81 mg by mouth daily.    [provider]  cholecalciferol (VITAMIN D3) 25 MCG (1000 UNIT) tablet Take 1,000 Units by mouth daily.    [provider]  colchicine 0.6 MG tablet Take 0.6 mg by mouth as needed.    [provider]  lidocaine (LIDODERM) 5 % Place 1 patch onto the skin every 12 (twelve) hours. Remove & Discard patch within 12 hours or as directed by MD 09/15/20 09/15/21  Tommi Rumps, PA-C  meloxicam (MOBIC) 15 MG tablet Take 1 tablet (15 mg total) by mouth daily. 09/15/20 09/15/21  Tommi Rumps, PA-C  metFORMIN (GLUCOPHAGE) 1000 MG tablet Take 1,000 mg by mouth 2 (two) times daily with a meal.    [provider]  Multiple Vitamins-Minerals (MULTIVITAMIN WITH MINERALS) tablet Take 1 tablet by mouth daily.    [provider]  sildenafil (REVATIO) 20 MG tablet Take 20 mg by mouth 3 (three) times daily.    [provider]  torsemide (DEMADEX) 20 MG  tablet Take 40 mg by mouth 2 (two) times daily.    [provider]    Allergies Tylenol [acetaminophen], Dog epithelium allergy skin test, Apple, and Other  Family History  Problem Relation Age of Onset   Hypertension Mother    Hypertension Father    CVA Father 99    Social History Social History   Tobacco Use   Smoking status: Former    Packs/day: 1.00    Years: 20.00    Pack years: 20.00    Types: Cigarettes    Quit date: 10/20/2010    Years since quitting: 10.5   Smokeless tobacco: Never  Substance Use Topics   Alcohol use: Not Currently   Drug use: No    Review of Systems  Review of Systems  Constitutional:  Positive for malaise/fatigue. Negative for chills and fever.  HENT:  Positive for congestion and sinus pain. Negative for sore throat.   Eyes:  Negative for pain.  Respiratory:  Positive for cough and shortness of breath. Negative for stridor.   Cardiovascular:  Positive for chest pain.  Gastrointestinal:  Negative for abdominal pain, nausea and vomiting.  Genitourinary:  Negative for dysuria.  Musculoskeletal:  Negative for myalgias.  Skin:  Negative for rash.  Neurological:  Positive for weakness and headaches. Negative for seizures and loss of consciousness.  Psychiatric/Behavioral:  Negative for suicidal ideas.   All other systems reviewed and are negative.    ____________________________________________   PHYSICAL EXAM:  VITAL SIGNS: ED Triage Vitals [05/07/21 1354]  Enc Vitals Group     BP (!) 139/96     Pulse Rate (!) 123     Resp 19     Temp 98.8 F (37.1 C)     Temp src      SpO2 93 %     Weight      Height      Head Circumference      Peak Flow      Pain Score 3     Pain Loc      Pain Edu?      Excl. in GC?    Vitals:   05/07/21 1354 05/07/21 1552  BP: (!) 139/96 (!) 141/86  Pulse: (!) 123 (!) 118  Resp: 19 (!) 22  Temp: 98.8 F (37.1 C)   SpO2: 93% 92%   Physical Exam Vitals and nursing note reviewed.   Constitutional:      General: He is in acute distress.     Appearance: He is well-developed. He is obese. He is ill-appearing.  HENT:     Head: Normocephalic and atraumatic.     Right Ear: External ear normal.     Left Ear: External ear normal.     Nose: Nose normal.  Eyes:     Conjunctiva/sclera: Conjunctivae normal.  Cardiovascular:     Rate and Rhythm: Regular rhythm. Tachycardia present.     Heart sounds: No murmur heard. Pulmonary:  Effort: Respiratory distress present.     Breath sounds: Decreased air movement present. Rales present.  Abdominal:     Palpations: Abdomen is soft.     Tenderness: There is no abdominal tenderness.  Musculoskeletal:     Cervical back: Neck supple.     Right lower leg: Edema present.     Left lower leg: Edema present.  Skin:    General: Skin is warm and dry.     Capillary Refill: Capillary refill takes less than 2 seconds.  Neurological:     Mental Status: He is alert and oriented to person, place, and time.  Psychiatric:        Mood and Affect: Mood normal.    Oropharynx is unremarkable. ____________________________________________   LABS (all labs ordered are listed, but only abnormal results are displayed)  Labs Reviewed  CBC - Abnormal; Notable for the following components:      Result Value   MCV 78.8 (*)    RDW 16.2 (*)    All other components within normal limits  BASIC METABOLIC PANEL - Abnormal; Notable for the following components:   Potassium 3.1 (*)    Chloride 94 (*)    Glucose, Bld 186 (*)    Creatinine, Ser 1.35 (*)    All other components within normal limits  RESP PANEL BY RT-PCR (FLU A&B, COVID) ARPGX2  MAGNESIUM  PROCALCITONIN  BRAIN NATRIURETIC PEPTIDE  D-DIMER, QUANTITATIVE (NOT AT [facility])  HIV ANTIBODY (ROUTINE TESTING W REFLEX)  TROPONIN I (HIGH SENSITIVITY)  TROPONIN I (HIGH SENSITIVITY)   ____________________________________________  EKG  Sinus tachycardia with a rate of 124, normal axis,  unremarkable intervals without gross acute ischemia or significant arrhythmia. ____________________________________________  RADIOLOGY  ED MD interpretation: Chest x-ray has evidence of cardiomegaly and some pulmonary edema.  No clear focal consolidation, large effusion or pneumothorax. Official radiology report(s): DG Chest 2 View  Result Date: 05/07/2021 CLINICAL DATA:  Shortness of breath. Congestion and cough. Runny nose. EXAM: CHEST - 2 VIEW COMPARISON:  Chest radiograph 05/07/2020, chest CT 12/22/2019 FINDINGS: Borderline cardiomegaly. Stable mediastinal contours. Vascular congestion. Heterogeneous opacities in the lung bases that may in part be chronic and secondary to mosaic attenuation is seen on prior CT. No pleural effusion or pneumothorax. Chronic wedging of vertebra at the thoracolumbar junction. No acute osseous abnormalities are seen. IMPRESSION: Borderline cardiomegaly with vascular congestion. Heterogeneous opacities in the lung bases may in part be chronic and secondary to mosaic attenuation as seen on prior CT. Overall appearance is similar to prior exam Electronically Signed   By: Narda Rutherford M.D.   On: 05/07/2021 14:57    ____________________________________________   PROCEDURES  Procedure(s) performed (including Critical Care):  .Critical Care  Date/Time: 05/07/2021 4:42 PM Performed by: Gilles Chiquito, MD Authorized by: Gilles Chiquito, MD   Critical care provider statement:    Critical care time (minutes):  45   Critical care was necessary to treat or prevent imminent or life-threatening deterioration of the following conditions:  Respiratory failure   Critical care was time spent personally by me on the following activities:  Discussions with consultants, evaluation of patient's response to treatment, examination of patient, ordering and performing treatments and interventions, ordering and review of laboratory studies, ordering and review of radiographic  studies, pulse oximetry, re-evaluation of patient's condition, obtaining history from patient or surrogate and review of old charts   ____________________________________________   INITIAL IMPRESSION / ASSESSMENT AND PLAN / ED COURSE      Patient  presents for assessment of several days of worsening cough chest pain, shortness of breath, congestion and sinus pressure.  On arrival he is tachycardic at 123 and borderline hypoxic with SPO2 of 93% on room air with otherwise stable vitals.  He is afebrile.  He does appear obese and has diminished and rhonchorous breath sounds at the bases but otherwise has bilateral breath sounds.  He is tachycardic.  Has some bilateral lower extremity edema noted on exam.  Differential includes acute viral bronchitis, pneumonia, PE, CHF exacerbation, pericarditis, myocarditis, ACS, arrhythmia, anemia and pericardial effusion.  Chest x-ray has evidence of cardiomegaly and some pulmonary edema.  No clear focal consolidation, large effusion or pneumothorax.  ECG and nonelevated troponin are not consistent with ACS or myocarditis.  I have a low suspicion for PE as D-dimer is 0.3.  COVID influenza swab is negative.  Patient has no fever, leukocytosis or clear consolidation on chest x-ray to suggest bacterial pneumonia.  proBNP is 30 suspect component of diastolic heart failure exacerbation in the setting of possible bronchitis.  On my reassessment patient is hypoxic at 89% on room air still tachycardic.  Given concern for possible volume overload on chest x-ray as well as edema on exam we will give a dose of Lasix and admit patient to medicine service for further evaluation and management.      ____________________________________________   FINAL CLINICAL IMPRESSION(S) / ED DIAGNOSES  Final diagnoses:  Acute on chronic congestive heart failure, unspecified heart failure type (HCC)  Bronchitis  Acute respiratory failure with hypoxia (HCC)    Medications   colchicine tablet 0.6 mg (has no administration in time range)  ondansetron (ZOFRAN) tablet 4 mg (has no administration in time range)    Or  ondansetron (ZOFRAN) injection 4 mg (has no administration in time range)  enoxaparin (LOVENOX) injection 70 mg (has no administration in time range)  ibuprofen (ADVIL) tablet 600 mg (has no administration in time range)  azithromycin (ZITHROMAX) 500 mg in sodium chloride 0.9 % 250 mL IVPB (has no administration in time range)  potassium chloride SA (KLOR-CON) CR tablet 40 mEq (40 mEq Oral Given 05/07/21 1452)  furosemide (LASIX) injection 40 mg (40 mg Intravenous Given 05/07/21 1619)     ED Discharge Orders     None        Note:  This document was prepared using Dragon voice recognition software and may include unintentional dictation errors.    Gilles ChiquitoSmith, Lesean Woolverton P, MD 05/07/21 [phone]

## 2021-05-07 NOTE — ED Triage Notes (Signed)
Pt come with c/o congestion cough and runny nose. Pt states he has sinus infection. Pt states it started 3 days ago. Pt states productive cough.

## 2021-05-08 ENCOUNTER — Encounter: Payer: Self-pay | Admitting: Student

## 2021-05-08 DIAGNOSIS — Z7984 Long term (current) use of oral hypoglycemic drugs: Secondary | ICD-10-CM | POA: Diagnosis not present

## 2021-05-08 DIAGNOSIS — E876 Hypokalemia: Secondary | ICD-10-CM | POA: Diagnosis not present

## 2021-05-08 DIAGNOSIS — J440 Chronic obstructive pulmonary disease with acute lower respiratory infection: Secondary | ICD-10-CM | POA: Diagnosis present

## 2021-05-08 DIAGNOSIS — Z79899 Other long term (current) drug therapy: Secondary | ICD-10-CM | POA: Diagnosis not present

## 2021-05-08 DIAGNOSIS — J9601 Acute respiratory failure with hypoxia: Secondary | ICD-10-CM

## 2021-05-08 DIAGNOSIS — Z7982 Long term (current) use of aspirin: Secondary | ICD-10-CM | POA: Diagnosis not present

## 2021-05-08 DIAGNOSIS — M109 Gout, unspecified: Secondary | ICD-10-CM | POA: Diagnosis present

## 2021-05-08 DIAGNOSIS — Z886 Allergy status to analgesic agent status: Secondary | ICD-10-CM | POA: Diagnosis not present

## 2021-05-08 DIAGNOSIS — I272 Pulmonary hypertension, unspecified: Secondary | ICD-10-CM | POA: Diagnosis present

## 2021-05-08 DIAGNOSIS — E119 Type 2 diabetes mellitus without complications: Secondary | ICD-10-CM | POA: Diagnosis present

## 2021-05-08 DIAGNOSIS — Z791 Long term (current) use of non-steroidal anti-inflammatories (NSAID): Secondary | ICD-10-CM | POA: Diagnosis not present

## 2021-05-08 DIAGNOSIS — J9621 Acute and chronic respiratory failure with hypoxia: Secondary | ICD-10-CM | POA: Diagnosis present

## 2021-05-08 DIAGNOSIS — G4733 Obstructive sleep apnea (adult) (pediatric): Secondary | ICD-10-CM | POA: Diagnosis present

## 2021-05-08 DIAGNOSIS — J9691 Respiratory failure, unspecified with hypoxia: Secondary | ICD-10-CM | POA: Diagnosis present

## 2021-05-08 DIAGNOSIS — Z8249 Family history of ischemic heart disease and other diseases of the circulatory system: Secondary | ICD-10-CM | POA: Diagnosis not present

## 2021-05-08 DIAGNOSIS — Z6841 Body Mass Index (BMI) 40.0 and over, adult: Secondary | ICD-10-CM | POA: Diagnosis not present

## 2021-05-08 DIAGNOSIS — Z9109 Other allergy status, other than to drugs and biological substances: Secondary | ICD-10-CM | POA: Diagnosis not present

## 2021-05-08 DIAGNOSIS — J208 Acute bronchitis due to other specified organisms: Secondary | ICD-10-CM | POA: Diagnosis present

## 2021-05-08 DIAGNOSIS — Z91018 Allergy to other foods: Secondary | ICD-10-CM | POA: Diagnosis not present

## 2021-05-08 DIAGNOSIS — K219 Gastro-esophageal reflux disease without esophagitis: Secondary | ICD-10-CM | POA: Diagnosis present

## 2021-05-08 DIAGNOSIS — I5032 Chronic diastolic (congestive) heart failure: Secondary | ICD-10-CM | POA: Diagnosis present

## 2021-05-08 DIAGNOSIS — T501X5A Adverse effect of loop [high-ceiling] diuretics, initial encounter: Secondary | ICD-10-CM | POA: Diagnosis not present

## 2021-05-08 DIAGNOSIS — I110 Hypertensive heart disease with heart failure: Secondary | ICD-10-CM | POA: Diagnosis present

## 2021-05-08 DIAGNOSIS — F17210 Nicotine dependence, cigarettes, uncomplicated: Secondary | ICD-10-CM | POA: Diagnosis present

## 2021-05-08 DIAGNOSIS — R0602 Shortness of breath: Secondary | ICD-10-CM | POA: Diagnosis present

## 2021-05-08 DIAGNOSIS — Z20822 Contact with and (suspected) exposure to covid-19: Secondary | ICD-10-CM | POA: Diagnosis present

## 2021-05-08 LAB — VITAMIN D 25 HYDROXY (VIT D DEFICIENCY, FRACTURES): Vit D, 25-Hydroxy: 41.49 ng/mL (ref 30–100)

## 2021-05-08 LAB — IRON AND TIBC
Iron: 25 ug/dL — ABNORMAL LOW (ref 45–182)
Saturation Ratios: 8 % — ABNORMAL LOW (ref 17.9–39.5)
TIBC: 308 ug/dL (ref 250–450)
UIBC: 283 ug/dL

## 2021-05-08 LAB — CBC
HCT: 41.2 % (ref 39.0–52.0)
Hemoglobin: 13.4 g/dL (ref 13.0–17.0)
MCH: 25.6 pg — ABNORMAL LOW (ref 26.0–34.0)
MCHC: 32.5 g/dL (ref 30.0–36.0)
MCV: 78.6 fL — ABNORMAL LOW (ref 80.0–100.0)
Platelets: 225 10*3/uL (ref 150–400)
RBC: 5.24 MIL/uL (ref 4.22–5.81)
RDW: 16.2 % — ABNORMAL HIGH (ref 11.5–15.5)
WBC: 7.4 10*3/uL (ref 4.0–10.5)
nRBC: 0 % (ref 0.0–0.2)

## 2021-05-08 LAB — BASIC METABOLIC PANEL
Anion gap: 12 (ref 5–15)
BUN: 14 mg/dL (ref 6–20)
CO2: 33 mmol/L — ABNORMAL HIGH (ref 22–32)
Calcium: 9.7 mg/dL (ref 8.9–10.3)
Chloride: 94 mmol/L — ABNORMAL LOW (ref 98–111)
Creatinine, Ser: 1.19 mg/dL (ref 0.61–1.24)
GFR, Estimated: >60 mL/min (ref >60)
Glucose, Bld: 153 mg/dL — ABNORMAL HIGH (ref 70–99)
Potassium: 3.2 mmol/L — ABNORMAL LOW (ref 3.5–5.1)
Sodium: 139 mmol/L (ref 135–145)

## 2021-05-08 LAB — FOLATE: Folate: 15.3 ng/mL (ref >5.9)

## 2021-05-08 LAB — GLUCOSE, CAPILLARY
Glucose-Capillary: 176 mg/dL — ABNORMAL HIGH (ref 70–99)
Glucose-Capillary: 150 mg/dL — ABNORMAL HIGH (ref 70–99)
Glucose-Capillary: 267 mg/dL — ABNORMAL HIGH (ref 70–99)

## 2021-05-08 LAB — PROCALCITONIN: Procalcitonin: 0.29 ng/mL

## 2021-05-08 LAB — VITAMIN B12: Vitamin B-12: 630 pg/mL (ref 180–914)

## 2021-05-08 MED ORDER — POTASSIUM CHLORIDE CRYS ER 20 MEQ PO TBCR
40.0000 meq | EXTENDED_RELEASE_TABLET | Freq: Once | ORAL | Status: AC
  Administered 2021-05-08: 40 meq via ORAL
  Filled 2021-05-08: qty 2

## 2021-05-08 MED ORDER — PREDNISONE 50 MG PO TABS
50.0000 mg | ORAL_TABLET | Freq: Every day | ORAL | Status: AC
  Administered 2021-05-08 – 2021-05-10 (×3): 50 mg via ORAL
  Filled 2021-05-08 (×3): qty 1

## 2021-05-08 MED ORDER — TORSEMIDE 20 MG PO TABS
40.0000 mg | ORAL_TABLET | Freq: Two times a day (BID) | ORAL | Status: DC
  Administered 2021-05-08 – 2021-05-10 (×4): 40 mg via ORAL
  Filled 2021-05-08 (×4): qty 2

## 2021-05-08 MED ORDER — IBUPROFEN 400 MG PO TABS
400.0000 mg | ORAL_TABLET | Freq: Four times a day (QID) | ORAL | Status: DC | PRN
  Administered 2021-05-08: 400 mg via ORAL

## 2021-05-08 MED ORDER — FUROSEMIDE 40 MG PO TABS
40.0000 mg | ORAL_TABLET | Freq: Every day | ORAL | Status: DC
  Administered 2021-05-08: 14:00:00 40 mg via ORAL
  Filled 2021-05-08: qty 1

## 2021-05-08 MED ORDER — SILDENAFIL CITRATE 20 MG PO TABS
20.0000 mg | ORAL_TABLET | Freq: Three times a day (TID) | ORAL | Status: DC
  Administered 2021-05-08 – 2021-05-10 (×6): 20 mg via ORAL
  Filled 2021-05-08 (×8): qty 1

## 2021-05-08 MED ORDER — FLUTICASONE FUROATE-VILANTEROL 100-25 MCG/INH IN AEPB
1.0000 | INHALATION_SPRAY | Freq: Every day | RESPIRATORY_TRACT | Status: DC
  Administered 2021-05-08 – 2021-05-10 (×3): 1 via RESPIRATORY_TRACT
  Filled 2021-05-08: qty 28

## 2021-05-08 MED ORDER — IPRATROPIUM-ALBUTEROL 0.5-2.5 (3) MG/3ML IN SOLN: 3.0000 mL | Freq: Four times a day (QID) | RESPIRATORY_TRACT | Status: DC | PRN

## 2021-05-08 NOTE — Progress Notes (Signed)
Skin swarm completed with Regina Eck. Healing abrasion noted to top of head. 4L via [place]. Oriented to room and call light system.

## 2021-05-08 NOTE — TOC Progression Note (Signed)
Transition of Care [facility]) - Progression Note    Patient Details  Name: Charles Rangel MRN: [id number] Date of Birth: 08-06-1976  Transition of Care [facility]) CM/SW Contact  Caryn Section, RN Phone Number: 05/08/2021, 1:07 PM  Clinical Narrative:   Patient states he lives with family, who can assist him on discharge, and he has no concerns about returning home after discharge.  He does not currently have home health services.  Patient has no concerns about transportation to appointments, and he is able to obtain medications and take them as directed.  He voices no further TOC needs at this time.  TOC contact information given to patient.  Will follow for any additional needs.    Expected Discharge Plan: Home/Self Care Barriers to Discharge: Continued Medical Work up  Expected Discharge Plan and Services Expected Discharge Plan: Home/Self Care   Discharge Planning Services: CM Consult   Living arrangements for the past 2 months: Single Family Home                                       Social Determinants of Health (SDOH) Interventions    Readmission Risk Interventions No flowsheet data found.

## 2021-05-08 NOTE — Progress Notes (Addendum)
[facility] Progress Note  Patient: Charles Rangel    PHX:[id number]  DOA: 05/07/2021     Date of Service: the patient was seen and examined on 05/08/2021  Chief Complaint  Patient presents with   Cough   Brief hospital course:  Burlin Mcnair is a 45 y.o. male with medical history significant for obesity, hypertension, non-insulin-dependent diabetes mellitus, obstructive sleep apnea is compliant with CPAP, presents to the emergency department for chief concerns of shortness of breath. Shortness of breath started since Thursday, 05/02/2021 and gradually worsening a/w white phlegm productive cough and nausea. He denies fever, sick contacts, vomiting, diarrhea, chest pain, abdominal pain, dysuria, hematuria, syncope, lost of consciousne. He endorses compliance with his cpap use. At baseline patient does not wear home oxygen. ED work-up showed acute hyper respiratory failure, patient was placed on 2 L oxygen via nasal cannula.  Labs: sodium 139, potassium 3.1, chloride 94, bicarb 32, BUN 14, serum creatinine of 1.35, nonfasting blood glucose 182, troponin 11, BNP 30.5, procalcitonin 0.33, COVID PCR was negative Emergency medicine provider ordered Lasix 40 mg IV, kcl 40 mEq p.o. Patient desatted to 88% on room air with ambulation.  So patient was admitted for further management as below.    Assessment and Plan: Principal Problem:   Acute hypoxemic respiratory failure (HCC) Active Problems:   Gout   HTN (hypertension)   DM (diabetes mellitus) (HCC)   Pulmonary hypertension, unspecified (HCC)   GERD (gastroesophageal reflux disease)   Acute on chronic respiratory failure with hypoxia (HCC)   Hypokalemia   Obesity, Class III, BMI 40-49.9 (morbid obesity) (HCC)   OSA (obstructive sleep apnea)   # Acute hypoxemic respiratory failure suspect secondary to bronchitis, unknown cause could be viral versus bacterial - Procalcitonin was mildly elevated at 0.33 - Azithromycin 500 mg IV, 3  days ordered - Nasal cannula as needed to maintain SPO2 greater than 92% - no telemetry Started Breo Ellipta inhaler, DuoNeb every 6 hourly as needed, prednisone 50 mg daily for 3 days   # Hypertension-resumed metoprolol succinate 100 mg daily 7/19,  lisinopril 5 mg daily for 05/08/2021 - Hydralazine 10 mg every 6 hours as needed for SBP greater than 180  # Non-insulin-dependent diabetes mellitus-metformin 1000 mg p.o. twice daily with meals resumed -Insulin SSI with at bedtime coverage ordered  # Hypokalemia, most likely due to lasix -s/p  kcl 40 mEq p.o. x 2 doses given  # Pulmonary hypertension-CPAP nightly ordered, resumed revatio PTA dose  # OSA-CPAP nightly ordered # GERD-PPI resumed # Gout-in remission; resumed home allopurinol 100 mg daily, colchicine. # Bilateral lower extremity edema, resume Torsemide 40 BID home dose   # Morbid obesity, Body mass index is 49.82 kg/m.   counseled patient to make an attempt to improve diet and exercise via replacing one side per day with broccoli, green beans, spinach, cauliflower; walk for 30 minutes a day in a safe area when the temperature is not too hot or too cold    Diet: Heart healthy/carb modified DVT Prophylaxis: Subcutaneous Lovenox   Advance goals of care discussion: Full code  Family Communication: family was NOTT present at bedside, at the time of interview.  The pt provided permission to discuss medical plan with the family. Opportunity was given to ask question and all questions were answered satisfactorily.   Disposition:  Pt is from Home, admitted with Resp failure, still has resp failure, which precludes a safe discharge. Discharge to home, when off supplemental oxygen.  Subjective: No overnight events,  patient still requires supplemental O2 inhalation, feels improvement in the breathing, no chest pain or palpitations, denies any fever or chills.  Physical Exam: General:  alert oriented to time, place, and person.   Appear in no distress, affect appropriate Eyes: PERRLA ENT: Oral Mucosa Clear, moist  Neck: no JVD,  Cardiovascular: S1 and S2 Present, no Murmur,  Respiratory: good respiratory effort, Bilateral Air entry equal and Decreased, b/l Crackles, mild wheezes Abdomen: Bowel Sound present, Soft and no tenderness,  Skin: no rashes Extremities: no Pedal edema, no calf tenderness Neurologic: without any new focal findings Gait not checked due to patient safety concerns  Vitals:   05/08/21 0336 05/08/21 0818 05/08/21 0958 05/08/21 1206  BP: (!) 142/101 135/85  (!) 141/93  Pulse: 90 91  88  Resp: 18 12  18   Temp: (!) 97.5 F (36.4 C) (!) 97.5 F (36.4 C)  (!) 97.5 F (36.4 C)  TempSrc: Oral   Oral  SpO2: 97% 99% 93% 99%  Weight:      Height:        Intake/Output Summary (Last 24 hours) at 05/08/2021 1248 Last data filed at 05/08/2021 1003 Gross per 24 hour  Intake 850 ml  Output 1425 ml  Net -575 ml   Filed Weights   05/07/21 1435  Weight: (!) 140 kg    Data Reviewed: I have personally reviewed and interpreted daily labs, tele strips, imagings as discussed above. I reviewed all nursing notes, pharmacy notes, vitals, pertinent old records I have discussed plan of care as described above with RN and patient/family.  CBC: Recent Labs  Lab 05/07/21 1341 05/08/21 0404  WBC 8.9 7.4  HGB 13.6 13.4  HCT 40.4 41.2  MCV 78.8* 78.6*  PLT 259 225   Basic Metabolic Panel: Recent Labs  Lab 05/07/21 1341 05/08/21 0404  NA 139 139  K 3.1* 3.2*  CL 94* 94*  CO2 32 33*  GLUCOSE 186* 153*  BUN 14 14  CREATININE 1.35* 1.19  CALCIUM 9.8 9.7  MG 1.9  --     Studies: DG Chest 2 View  Result Date: 05/07/2021 CLINICAL DATA:  Shortness of breath. Congestion and cough. Runny nose. EXAM: CHEST - 2 VIEW COMPARISON:  Chest radiograph 05/07/2020, chest CT 12/22/2019 FINDINGS: Borderline cardiomegaly. Stable mediastinal contours. Vascular congestion. Heterogeneous opacities in the lung  bases that may in part be chronic and secondary to mosaic attenuation is seen on prior CT. No pleural effusion or pneumothorax. Chronic wedging of vertebra at the thoracolumbar junction. No acute osseous abnormalities are seen. IMPRESSION: Borderline cardiomegaly with vascular congestion. Heterogeneous opacities in the lung bases may in part be chronic and secondary to mosaic attenuation as seen on prior CT. Overall appearance is similar to prior exam Electronically Signed   By: 02/21/2020 M.D.   On: 05/07/2021 14:57    Scheduled Meds:  allopurinol  100 mg Oral Daily   aspirin  81 mg Oral Daily   cholecalciferol  1,000 Units Oral Daily   enoxaparin (LOVENOX) injection  0.5 mg/kg Subcutaneous Q24H   insulin aspart  0-15 Units Subcutaneous TID WC   insulin aspart  0-5 Units Subcutaneous QHS   lisinopril  5 mg Oral Daily   metoprolol succinate  100 mg Oral Daily   pantoprazole  40 mg Oral Daily   Continuous Infusions:  azithromycin Stopped (05/07/21 2033)   PRN Meds: colchicine, fluticasone, hydrALAZINE, ibuprofen, ondansetron **OR** ondansetron (ZOFRAN) IV  Time spent: 35 minutes  Author: 05/09/21. MD  [facility] 05/08/2021 12:48 PM  To reach On-call, see care teams to locate the attending and reach out to them via [email or web address]. If 7PM-7AM, please contact night-coverage If you still have difficulty reaching the attending provider, please page the [facility] (Director on Call) for [facility] on amion for assistance.

## 2021-05-09 LAB — CBC
HCT: 42.1 % (ref 39.0–52.0)
Hemoglobin: 13.8 g/dL (ref 13.0–17.0)
MCH: 26.1 pg (ref 26.0–34.0)
MCHC: 32.8 g/dL (ref 30.0–36.0)
MCV: 79.7 fL — ABNORMAL LOW (ref 80.0–100.0)
Platelets: 262 10*3/uL (ref 150–400)
RBC: 5.28 MIL/uL (ref 4.22–5.81)
RDW: 16.1 % — ABNORMAL HIGH (ref 11.5–15.5)
WBC: 7.7 10*3/uL (ref 4.0–10.5)
nRBC: 0 % (ref 0.0–0.2)

## 2021-05-09 LAB — BASIC METABOLIC PANEL
Anion gap: 12 (ref 5–15)
BUN: 17 mg/dL (ref 6–20)
CO2: 32 mmol/L (ref 22–32)
Calcium: 10.2 mg/dL (ref 8.9–10.3)
Chloride: 94 mmol/L — ABNORMAL LOW (ref 98–111)
Creatinine, Ser: 1.24 mg/dL (ref 0.61–1.24)
GFR, Estimated: >60 mL/min (ref >60)
Glucose, Bld: 224 mg/dL — ABNORMAL HIGH (ref 70–99)
Potassium: 4.1 mmol/L (ref 3.5–5.1)
Sodium: 138 mmol/L (ref 135–145)

## 2021-05-09 LAB — GLUCOSE, CAPILLARY
Glucose-Capillary: 179 mg/dL — ABNORMAL HIGH (ref 70–99)
Glucose-Capillary: 216 mg/dL — ABNORMAL HIGH (ref 70–99)
Glucose-Capillary: 372 mg/dL — ABNORMAL HIGH (ref 70–99)
Glucose-Capillary: 252 mg/dL — ABNORMAL HIGH (ref 70–99)

## 2021-05-09 LAB — PHOSPHORUS: Phosphorus: 3.1 mg/dL (ref 2.5–4.6)

## 2021-05-09 LAB — HEMOGLOBIN A1C
Hgb A1c MFr Bld: 6.3 % — ABNORMAL HIGH (ref 4.8–5.6)
Mean Plasma Glucose: 134 mg/dL

## 2021-05-09 LAB — PROCALCITONIN: Procalcitonin: 0.2 ng/mL

## 2021-05-09 LAB — MAGNESIUM: Magnesium: 2.5 mg/dL — ABNORMAL HIGH (ref 1.7–2.4)

## 2021-05-09 MED ORDER — ASCORBIC ACID 500 MG PO TABS
500.0000 mg | ORAL_TABLET | Freq: Every day | ORAL | Status: DC
  Administered 2021-05-09 – 2021-05-10 (×2): 500 mg via ORAL
  Filled 2021-05-09 (×2): qty 1

## 2021-05-09 MED ORDER — POLYSACCHARIDE IRON COMPLEX 150 MG PO CAPS
150.0000 mg | ORAL_CAPSULE | Freq: Every day | ORAL | Status: DC
  Administered 2021-05-09 – 2021-05-10 (×2): 150 mg via ORAL
  Filled 2021-05-09 (×2): qty 1

## 2021-05-09 NOTE — Progress Notes (Signed)
Inpatient Diabetes Program Recommendations  AACE/ADA: New Consensus Statement on Inpatient Glycemic Control (2015)  Target Ranges:  Prepandial:   less than 140 mg/dL      Peak postprandial:   less than 180 mg/dL (1-2 hours)      Critically ill patients:  140 - 180 mg/dL   Lab Results  Component Value Date   GLUCAP 179 (H) 05/09/2021   HGBA1C 6.3 (H) 05/08/2021    Review of Glycemic Control Results for TRAIVON, MORRICAL (MRN [id number]) as of 05/09/2021 10:22  Ref. Range 05/08/2021 15:43 05/08/2021 21:26 05/09/2021 08:11  Glucose-Capillary Latest Ref Range: 70 - 99 mg/dL 967 (H) 289 (H) 791 (H)   Diabetes history: DM 2 Outpatient Diabetes medications: Metformin 1000 mg bid Current orders for Inpatient glycemic control:  Novolog 0-15 units tid + hs  Inpatient Diabetes Program Recommendations:    PO prednisone 50 mg Daily If glucose trends increase after PO intake and Prednisone dose consider:  -  Novolog 3 units tid meal coverage if eating >50% of meals  Thanks,  Christena Deem RN, MSN, BC-ADM Inpatient Diabetes Coordinator Team Pager [phone] (8a-5p)

## 2021-05-09 NOTE — Progress Notes (Signed)
[facility] Progress Note  Patient: Charles Rangel    SJG:[id number]  DOA: 05/07/2021     Date of Service: the patient was seen and examined on 05/09/2021  Chief Complaint  Patient presents with   Cough   Brief hospital course:  Charles Rangel is a 45 y.o. male with medical history significant for obesity, hypertension, non-insulin-dependent diabetes mellitus, obstructive sleep apnea is compliant with CPAP, presents to the emergency department for chief concerns of shortness of breath. Shortness of breath started since Thursday, 05/02/2021 and gradually worsening a/w white phlegm productive cough and nausea. He denies fever, sick contacts, vomiting, diarrhea, chest pain, abdominal pain, dysuria, hematuria, syncope, lost of consciousne. He endorses compliance with his cpap use. At baseline patient does not wear home oxygen. ED work-up showed acute hyper respiratory failure, patient was placed on 2 L oxygen via nasal cannula.  Labs: sodium 139, potassium 3.1, chloride 94, bicarb 32, BUN 14, serum creatinine of 1.35, nonfasting blood glucose 182, troponin 11, BNP 30.5, procalcitonin 0.33, COVID PCR was negative Emergency medicine provider ordered Lasix 40 mg IV, kcl 40 mEq p.o. Patient desatted to 88% on room air with ambulation.  So patient was admitted for further management as below.    Assessment and Plan: Principal Problem:   Acute hypoxemic respiratory failure (HCC) Active Problems:   Gout   HTN (hypertension)   DM (diabetes mellitus) (HCC)   Pulmonary hypertension, unspecified (HCC)   GERD (gastroesophageal reflux disease)   Acute on chronic respiratory failure with hypoxia (HCC)   Hypokalemia   Obesity, Class III, BMI 40-49.9 (morbid obesity) (HCC)   OSA (obstructive sleep apnea)   # Acute hypoxemic respiratory failure suspect secondary to bronchitis, unknown cause could be viral versus bacterial - Procalcitonin was mildly elevated at 0.33 - Azithromycin 500 mg IV, 3  days ordered - Nasal cannula as needed to maintain SPO2 greater than 92% - no telemetry Started Breo Ellipta inhaler, DuoNeb every 6 hourly as needed, prednisone 50 mg daily for 3 days   # Hypertension-resumed metoprolol succinate 100 mg daily 7/19,  lisinopril 5 mg daily for 05/08/2021 - Hydralazine 10 mg every 6 hours as needed for SBP greater than 180  # Non-insulin-dependent diabetes mellitus-metformin 1000 mg p.o. twice daily with meals resumed -Insulin SSI with at bedtime coverage ordered  # Hypokalemia, most likely due to lasix -s/p  kcl 40 mEq p.o. x 2 doses given  # Pulmonary hypertension-CPAP nightly ordered, resumed revatio PTA dose  # OSA-CPAP nightly ordered # GERD-PPI resumed # Gout-in remission; resumed home allopurinol 100 mg daily, colchicine. # Bilateral lower extremity edema, resume Torsemide 40 BID home dose   # Morbid obesity, Body mass index is 49.82 kg/m.   counseled patient to make an attempt to improve diet and exercise via replacing one side per day with broccoli, green beans, spinach, cauliflower; walk for 30 minutes a day in a safe area when the temperature is not too hot or too cold    Diet: Heart healthy/carb modified DVT Prophylaxis: Subcutaneous Lovenox   Advance goals of care discussion: Full code  Family Communication: family was NOTT present at bedside, at the time of interview.  The pt provided permission to discuss medical plan with the family. Opportunity was given to ask question and all questions were answered satisfactorily.   Disposition:  Pt is from Home, admitted with Resp failure, still has resp failure, which precludes a safe discharge. Discharge to home, when off supplemental oxygen.  Subjective: No overnight events,  patient's shortness of breath is improving, on room air patient is saturating from 88 to 93%, O2 saturation dropped to 86% on ambulation at room air so patient was placed on 2 L oxygen.  Patient denied any chest pain  or palpitations, no any other active issues. Lower extremity edema is improving. We will continue to monitor today and plan for disposition tomorrow a.m.   Physical Exam: General:  alert oriented to time, place, and person.  Appear in no distress, affect appropriate Eyes: PERRLA ENT: Oral Mucosa Clear, moist  Neck: no JVD,  Cardiovascular: S1 and S2 Present, no Murmur,  Respiratory: good respiratory effort, Bilateral Air entry equal and Decreased, b/l Crackles, mild wheezes Abdomen: Bowel Sound present, Soft and no tenderness,  Skin: no rashes Extremities: no Pedal edema, no calf tenderness Neurologic: without any new focal findings Gait not checked due to patient safety concerns  Vitals:   05/09/21 0319 05/09/21 0810 05/09/21 0813 05/09/21 1156  BP: (!) 139/104 (!) 139/93 (!) 139/93 127/89  Pulse: 87 89 96 93  Resp: 18 15 15 16   Temp: 97.6 F (36.4 C) 98 F (36.7 C) 98 F (36.7 C) 97.9 F (36.6 C)  TempSrc:      SpO2: 96% 95% 96% 93%  Weight:      Height:        Intake/Output Summary (Last 24 hours) at 05/09/2021 1454 Last data filed at 05/09/2021 1135 Gross per 24 hour  Intake 730 ml  Output 2750 ml  Net -2020 ml   Filed Weights   05/07/21 1435  Weight: (!) 140 kg    Data Reviewed: I have personally reviewed and interpreted daily labs, tele strips, imagings as discussed above. I reviewed all nursing notes, pharmacy notes, vitals, pertinent old records I have discussed plan of care as described above with RN and patient/family.  CBC: Recent Labs  Lab 05/07/21 1341 05/08/21 0404 05/09/21 0605  WBC 8.9 7.4 7.7  HGB 13.6 13.4 13.8  HCT 40.4 41.2 42.1  MCV 78.8* 78.6* 79.7*  PLT 259 225 262   Basic Metabolic Panel: Recent Labs  Lab 05/07/21 1341 05/08/21 0404 05/09/21 0605  NA 139 139 138  K 3.1* 3.2* 4.1  CL 94* 94* 94*  CO2 32 33* 32  GLUCOSE 186* 153* 224*  BUN 14 14 17   CREATININE 1.35* 1.19 1.24  CALCIUM 9.8 9.7 10.2  MG 1.9  --  2.5*   PHOS  --   --  3.1    Studies: No results found.  Scheduled Meds:  allopurinol  100 mg Oral Daily   vitamin C  500 mg Oral Daily   aspirin  81 mg Oral Daily   cholecalciferol  1,000 Units Oral Daily   enoxaparin (LOVENOX) injection  0.5 mg/kg Subcutaneous Q24H   fluticasone furoate-vilanterol  1 puff Inhalation Daily   insulin aspart  0-15 Units Subcutaneous TID WC   insulin aspart  0-5 Units Subcutaneous QHS   iron polysaccharides  150 mg Oral Daily   lisinopril  5 mg Oral Daily   metoprolol succinate  100 mg Oral Daily   pantoprazole  40 mg Oral Daily   predniSONE  50 mg Oral Q breakfast   sildenafil  20 mg Oral TID   torsemide  40 mg Oral BID   Continuous Infusions:  azithromycin Stopped (05/08/21 2300)   PRN Meds: colchicine, fluticasone, hydrALAZINE, ibuprofen, ipratropium-albuterol, ondansetron **OR** ondansetron (ZOFRAN) IV  Time spent: 35 minutes  Author: . MD [facility] 05/09/2021  2:54 PM  To reach On-call, see care teams to locate the attending and reach out to them via [email or web address]. If 7PM-7AM, please contact night-coverage If you still have difficulty reaching the attending provider, please page the [facility] (Director on Call) for [facility] on amion for assistance.

## 2021-05-10 LAB — CBC
HCT: 43.8 % (ref 39.0–52.0)
Hemoglobin: 14.1 g/dL (ref 13.0–17.0)
MCH: 25.6 pg — ABNORMAL LOW (ref 26.0–34.0)
MCHC: 32.2 g/dL (ref 30.0–36.0)
MCV: 79.6 fL — ABNORMAL LOW (ref 80.0–100.0)
Platelets: 307 10*3/uL (ref 150–400)
RBC: 5.5 MIL/uL (ref 4.22–5.81)
RDW: 16.1 % — ABNORMAL HIGH (ref 11.5–15.5)
WBC: 9.7 10*3/uL (ref 4.0–10.5)
nRBC: 0 % (ref 0.0–0.2)

## 2021-05-10 LAB — BASIC METABOLIC PANEL
Anion gap: 11 (ref 5–15)
BUN: 26 mg/dL — ABNORMAL HIGH (ref 6–20)
CO2: 32 mmol/L (ref 22–32)
Calcium: 9.8 mg/dL (ref 8.9–10.3)
Chloride: 96 mmol/L — ABNORMAL LOW (ref 98–111)
Creatinine, Ser: 1.24 mg/dL (ref 0.61–1.24)
GFR, Estimated: >60 mL/min (ref >60)
Glucose, Bld: 145 mg/dL — ABNORMAL HIGH (ref 70–99)
Potassium: 3.8 mmol/L (ref 3.5–5.1)
Sodium: 139 mmol/L (ref 135–145)

## 2021-05-10 LAB — GLUCOSE, CAPILLARY
Glucose-Capillary: 159 mg/dL — ABNORMAL HIGH (ref 70–99)
Glucose-Capillary: 269 mg/dL — ABNORMAL HIGH (ref 70–99)

## 2021-05-10 LAB — MAGNESIUM: Magnesium: 2.4 mg/dL (ref 1.7–2.4)

## 2021-05-10 LAB — PHOSPHORUS: Phosphorus: 3.8 mg/dL (ref 2.5–4.6)

## 2021-05-10 MED ORDER — PREDNISONE 10 MG (21) PO TBPK: ORAL_TABLET | ORAL | 0 refills | Status: DC

## 2021-05-10 MED ORDER — FLUTICASONE FUROATE-VILANTEROL 100-25 MCG/INH IN AEPB: 1.0000 | INHALATION_SPRAY | Freq: Every day | RESPIRATORY_TRACT | 0 refills | Status: AC

## 2021-05-10 MED ORDER — ALBUTEROL SULFATE HFA 108 (90 BASE) MCG/ACT IN AERS: 2.0000 | INHALATION_SPRAY | Freq: Four times a day (QID) | RESPIRATORY_TRACT | 2 refills | Status: DC | PRN

## 2021-05-10 MED ORDER — POLYSACCHARIDE IRON COMPLEX 150 MG PO CAPS: 150.0000 mg | ORAL_CAPSULE | Freq: Every day | ORAL | 2 refills | Status: AC

## 2021-05-10 NOTE — TOC Progression Note (Signed)
Transition of Care [facility] - Progression Note    Patient Details  Name: Charles Rangel MRN: [id number] Date of Birth: 08-Nov-1975  Transition of Care [facility]) CM/SW Contact  Caryn Section, RN Phone Number: 05/10/2021, 1:26 PM  Clinical Narrative:   RNCM notified by care RN and MD that patient will require home O2 for ambulation.  Zach from [employer] notified, will set patient up with home O2.  TOC to follow.    Expected Discharge Plan: Home/Self Care Barriers to Discharge: Continued Medical Work up  Expected Discharge Plan and Services Expected Discharge Plan: Home/Self Care   Discharge Planning Services: CM Consult   Living arrangements for the past 2 months: Single Family Home Expected Discharge Date: 05/10/21                                     Social Determinants of Health (SDOH) Interventions    Readmission Risk Interventions No flowsheet data found.

## 2021-05-10 NOTE — Progress Notes (Signed)
SATURATION QUALIFICATIONS: (This note is used to comply with regulatory documentation for home oxygen)  Patient Saturations on Room Air at Rest = 94%  Patient Saturations on Room Air while Ambulating = 88%  Patient Saturations on 2 Liters of oxygen while Ambulating = 96%  Please briefly explain why patient needs home oxygen: Ambulated patient around unit. At rest in recliner, RA sats were 94%. We got up and walked roughly 15 feet without oxygen and saturations dropped to 88% RA. Applied oxygen increased to 2L  and within a minute his O2 sats were back up to 95%. He didn't complain of any distress or difficulty breathing. Ambulated back to room and is resting in recliner. Call bell in reach.

## 2021-05-10 NOTE — Plan of Care (Signed)
End of shift summary:  VSS on CPAP machine overnight. Denies pain or n/v. Blood glucose monitored. Urine output adequate. Refused SCDs, up in chair, encouraged to elevate extremities. Remained free from falls or injury. Call bell within reach and able to use.  Problem: Coping: Goal: Level of anxiety will decrease Outcome: Progressing   Problem: Clinical Measurements: Goal: Respiratory complications will improve Outcome: Progressing   Problem: Health Behavior/Discharge Planning: Goal: Ability to manage health-related needs will improve Outcome: Progressing

## 2021-05-10 NOTE — Discharge Summary (Signed)
[facility] Discharge Summary   Patient: Charles Rangel ZOX:[id number]  [facility]  Date of admission: 05/07/2021   Date of discharge:  05/10/2021     Discharge Diagnoses:  Principal Problem:   Acute hypoxemic respiratory failure (HCC) Active Problems:   Gout   HTN (hypertension)   DM (diabetes mellitus) (HCC)   Pulmonary hypertension, unspecified (HCC)   GERD (gastroesophageal reflux disease)   Acute on chronic respiratory failure with hypoxia (HCC)   Hypokalemia   Obesity, Class III, BMI 40-49.9 (morbid obesity) (HCC)   OSA (obstructive sleep apnea)   Respiratory failure with hypoxia (HCC)   Admitted From: Home Disposition:  Home   Recommendations for Outpatient Follow-up:  PCP: in 1 wk Pulmonologist in 1 week Follow up LABS/TEST:  FPTS and CXR in 4 wks   Diet recommendation: Cardiac diet  Activity: The patient is advised to gradually reintroduce usual activities, as tolerated  Discharge Condition: stable  Code Status: Full code   History of present illness: As per the H and P dictated on admission  Charles Rangel is a 45 y.o. male with medical history significant for obesity, hypertension, non-insulin-dependent diabetes mellitus, obstructive sleep apnea is compliant with CPAP, presents to the emergency department for chief concerns of shortness of breath. Shortness of breath started since Thursday, 05/02/2021 and gradually worsening a/w white phlegm productive cough and nausea. He denies fever, sick contacts, vomiting, diarrhea, chest pain, abdominal pain, dysuria, hematuria, syncope, lost of consciousne. He endorses compliance with his cpap use. At baseline patient does not wear home oxygen. ED work-up showed acute hyper respiratory failure, patient was placed on 2 L oxygen via nasal cannula. Labs: sodium 139, potassium 3.1, chloride 94, bicarb 32, BUN 14, serum creatinine of 1.35, nonfasting blood glucose 182, troponin 11, BNP 30.5,  procalcitonin 0.33, COVID PCR was negative Emergency medicine provider ordered Lasix 40 mg IV, kcl 40 mEq p.o. Patient desatted to 88% on room air with ambulation.  So patient was admitted for further management as below  Hospital Course:  # Acute hypoxemic respiratory failure suspect secondary to bronchitis, unknown cause could be viral versus bacterial. Procalcitonin was mildly elevated at 0.33, s/pAzithromycin 500 mg IV, 3 days given. Started Breo Ellipta inhaler, DuoNeb every 6 hourly as needed, prednisone 50 mg daily for 3 days.  Supplemental O2 inhalation via nasal cannula was given, patient was saturating about 94% at rest on room air, patient required O2 inhalation while ambulation, O2 saturation dropped to 88% and patient was given 2 L oxygen while ambulation, oxygen was around 96%.  So patient was discharged on supplemental O2 inhalation while ambulation.  Patient was advised to monitor pulse ox at home and follow with PCP and pulmonologist, patient will benefit from PFTs as an outpatient.  Patient was discharged on Breo Ellipta and albuterol inhaler as needed.  Patient was prescribed tapering dose of oral prednisone as well.  No need of more antibiotics. # Hypertension-resumed metoprolol succinate 100 mg daily 7/19, lisinopril 5 mg daily for 05/08/2021 Resumed home medications.  Patient was advised to monitor BP at home and follow with PCP. # Non-insulin-dependent diabetes mellitus-metformin 1000 mg p.o. twice daily with meals resumed S/p Insulin SSI with at bedtime coverage ordered. Resumed home meds on dc # Hypokalemia, most likely due to lasix, -s/p  kcl 40 mEq p.o. x 2 doses given # Pulmonary hypertension-CPAP nightly ordered, resumed revatio PTA dose  # OSA-CPAP nightly ordered # GERD-PPI resumed # Gout-in remission; resumed home allopurinol 100  mg daily, colchicine. # Bilateral lower extremity edema, resume Torsemide 40 BID home dose # Morbid obesity, Body mass index is 49.82 kg/m.  counseled patient to make an attempt to improve diet and exercise via replacing one side per day with broccoli, green beans, spinach, cauliflower; walk for 30 minutes a day in a safe area when the temperature is not too hot or too cold  Patient was ambulatory without any assistance. On the day of the discharge the patient's vitals were stable, and no other acute medical condition were reported by patient. the patient was felt safe to be discharge at Home with supplemental ventilation while ambulation..  Consultants: None Procedures: None  Discharge Exam: General: Appear in no distress, no Rash; Oral Mucosa Clear, moist. Cardiovascular: S1 and S2 Present, no Murmur, Respiratory: normal respiratory effort, Bilateral Air entry present and no Crackles, no wheezes Abdomen: Bowel Sound present, Soft and no tenderness, no hernia Extremities: no Pedal edema, no calf tenderness Neurology: alert and oriented to time, place, and person affect appropriate.  Filed Weights   05/07/21 1435  Weight: (!) 140 kg   Vitals:   05/10/21 0711 05/10/21 1154  BP: 140/87 (!) 140/99  Pulse: 92 98  Resp: 18 16  Temp: 97.6 F (36.4 C) 98.7 F (37.1 C)  SpO2: 99% 94%    DISCHARGE MEDICATION: Allergies as of 05/10/2021       Reactions   Tylenol [acetaminophen] Anaphylaxis   Dog Epithelium Allergy Skin Test Hives   Apple Hives   Other Hives   RAISINS        Medication List     STOP taking these medications    lidocaine 5 % Commonly known as: Lidoderm       TAKE these medications    albuterol 108 (90 Base) MCG/ACT inhaler Commonly known as: VENTOLIN HFA Inhale 2 puffs into the lungs every 6 (six) hours as needed for wheezing or shortness of breath.   allopurinol 100 MG tablet Commonly known as: ZYLOPRIM Take 100 mg by mouth daily.   aspirin 81 MG chewable tablet Chew 81 mg by mouth daily.   cholecalciferol 25 MCG (1000 UNIT) tablet Commonly known as: VITAMIN D3 Take 1,000 Units  by mouth daily.   colchicine 0.6 MG tablet Take 0.6 mg by mouth as needed.   fluticasone 50 MCG/ACT nasal spray Commonly known as: FLONASE Place 1-2 sprays into the nose 2 (two) times daily as needed for allergies.   fluticasone furoate-vilanterol 100-25 MCG/INH Aepb Commonly known as: BREO ELLIPTA Inhale 1 puff into the lungs daily. Start taking on: May 11, 2021   iron polysaccharides 150 MG capsule Commonly known as: NIFEREX Take 1 capsule (150 mg total) by mouth daily. Start taking on: May 11, 2021   lisinopril 5 MG tablet Commonly known as: ZESTRIL Take 5 mg by mouth daily.   metFORMIN 1000 MG tablet Commonly known as: GLUCOPHAGE Take 1,000 mg by mouth 2 (two) times daily with a meal.   metoprolol succinate 100 MG 24 hr tablet Commonly known as: TOPROL-XL Take 100 mg by mouth daily.   multivitamin with minerals tablet Take 1 tablet by mouth daily.   omeprazole 20 MG capsule Commonly known as: PRILOSEC Take 20 mg by mouth daily before breakfast.   predniSONE 10 MG (21) Tbpk tablet Commonly known as: STERAPRED UNI-PAK 21 TAB 40 mg p.o. daily for 2 days, 30 mg p.o. daily for 2 days, 20 mg p.o. daily for 2 days, 10 mg p.o. daily for 3  days,   sildenafil 20 MG tablet Commonly known as: REVATIO Take 20 mg by mouth 3 (three) times daily.   torsemide 20 MG tablet Commonly known as: DEMADEX Take 40 mg by mouth 2 (two) times daily.               Durable Medical Equipment  (From admission, onward)           Start     Ordered   05/10/21 1243  For home use only DME oxygen  Once       Comments: Patient needs oxygen during ambulation  Question Answer Comment  Length of Need 6 Months   Mode or (Route) Nasal cannula   Liters per Minute 2   Frequency Continuous (stationary and portable oxygen unit needed)   Oxygen delivery system Gas      05/10/21 1244           Allergies  Allergen Reactions   Tylenol [Acetaminophen] Anaphylaxis   Dog  Epithelium Allergy Skin Test Hives   Apple Hives   Other Hives    RAISINS   Discharge Instructions     Call MD for:  difficulty breathing, headache or visual disturbances   Complete by: As directed    Call MD for:  extreme fatigue   Complete by: As directed    Call MD for:  persistant dizziness or light-headedness   Complete by: As directed    Call MD for:  temperature >100.4   Complete by: As directed    Diet - low sodium heart healthy   Complete by: As directed    Discharge instructions   Complete by: As directed    Follow-up with PCP in 1 week, use oxygen while ambulation, gradually wean off as per improvement Follow with a pulmonologist for PFTs and further management of hypoxic respiratory failure,   Increase activity slowly   Complete by: As directed        The results of significant diagnostics from this hospitalization (including imaging, microbiology, ancillary and laboratory) are listed below for reference.    Significant Diagnostic Studies: DG Chest 2 View  Result Date: 05/07/2021 CLINICAL DATA:  Shortness of breath. Congestion and cough. Runny nose. EXAM: CHEST - 2 VIEW COMPARISON:  Chest radiograph 05/07/2020, chest CT 12/22/2019 FINDINGS: Borderline cardiomegaly. Stable mediastinal contours. Vascular congestion. Heterogeneous opacities in the lung bases that may in part be chronic and secondary to mosaic attenuation is seen on prior CT. No pleural effusion or pneumothorax. Chronic wedging of vertebra at the thoracolumbar junction. No acute osseous abnormalities are seen. IMPRESSION: Borderline cardiomegaly with vascular congestion. Heterogeneous opacities in the lung bases may in part be chronic and secondary to mosaic attenuation as seen on prior CT. Overall appearance is similar to prior exam Electronically Signed   By: Narda Rutherford M.D.   On: 05/07/2021 14:57    Microbiology: Recent Results (from the past 240 hour(s))  Resp Panel by RT-PCR (Flu A&B, Covid)  Nasopharyngeal Swab     Status: None   Collection Time: 05/07/21  2:44 PM   Specimen: Nasopharyngeal Swab; Nasopharyngeal(NP) swabs in vial transport medium  Result Value Ref Range Status   SARS Coronavirus 2 by RT PCR NEGATIVE NEGATIVE Final    Comment: (NOTE) SARS-CoV-2 target nucleic acids are NOT DETECTED.  The SARS-CoV-2 RNA is generally detectable in upper respiratory specimens during the acute phase of infection. The lowest concentration of SARS-CoV-2 viral copies this assay can detect is 138 copies/mL. A negative result does not  preclude SARS-Cov-2 infection and should not be used as the sole basis for treatment or other patient management decisions. A negative result may occur with  improper specimen collection/handling, submission of specimen other than nasopharyngeal swab, presence of viral mutation(s) within the areas targeted by this assay, and inadequate number of viral copies(<138 copies/mL). A negative result must be combined with clinical observations, patient history, and epidemiological information. The expected result is Negative.  Fact Sheet for Patients:  BloggerCourse.com  Fact Sheet for Healthcare Providers:  SeriousBroker.it  This test is no t yet approved or cleared by the [place] FDA and  has been authorized for detection and/or diagnosis of SARS-CoV-2 by FDA under an Emergency Use Authorization (EUA). This EUA will remain  in effect (meaning this test can be used) for the duration of the COVID-19 declaration under Section 564(b)(1) of the Act, 21 U.S.C.section 360bbb-3(b)(1), unless the authorization is terminated  or revoked sooner.       Influenza A by PCR NEGATIVE NEGATIVE Final   Influenza B by PCR NEGATIVE NEGATIVE Final    Comment: (NOTE) The Xpert Xpress SARS-CoV-2/FLU/RSV plus assay is intended as an aid in the diagnosis of influenza from Nasopharyngeal swab specimens and should not be  used as a sole basis for treatment. Nasal washings and aspirates are unacceptable for Xpert Xpress SARS-CoV-2/FLU/RSV testing.  Fact Sheet for Patients: BloggerCourse.com  Fact Sheet for Healthcare Providers: SeriousBroker.it  This test is not yet approved or cleared by the [place] FDA and has been authorized for detection and/or diagnosis of SARS-CoV-2 by FDA under an Emergency Use Authorization (EUA). This EUA will remain in effect (meaning this test can be used) for the duration of the COVID-19 declaration under Section 564(b)(1) of the Act, 21 U.S.C. section 360bbb-3(b)(1), unless the authorization is terminated or revoked.  Performed at [facility], [address] [place], [place] [address]   Group A Strep by PCR     Status: None   Collection Time: 05/07/21  5:19 PM   Specimen: Throat; Sterile Swab  Result Value Ref Range Status   Group A Strep by PCR NOT DETECTED NOT DETECTED Final    Comment: Performed at [facility], [address] [place], [place] [address]     Labs: CBC: Recent Labs  Lab 05/07/21 1341 05/08/21 0404 05/09/21 0605 05/10/21 0429  WBC 8.9 7.4 7.7 9.7  HGB 13.6 13.4 13.8 14.1  HCT 40.4 41.2 42.1 43.8  MCV 78.8* 78.6* 79.7* 79.6*  PLT 259 225 262 307   Basic Metabolic Panel: Recent Labs  Lab 05/07/21 1341 05/08/21 0404 05/09/21 0605 05/10/21 0429  NA 139 139 138 139  K 3.1* 3.2* 4.1 3.8  CL 94* 94* 94* 96*  CO2 32 33* 32 32  GLUCOSE 186* 153* 224* 145*  BUN 14 14 17  26*  CREATININE 1.35* 1.19 1.24 1.24  CALCIUM 9.8 9.7 10.2 9.8  MG 1.9  --  2.5* 2.4  PHOS  --   --  3.1 3.8   Liver Function Tests: No results for input(s): AST, ALT, ALKPHOS, BILITOT, PROT, ALBUMIN in the last 168 hours. No results for input(s): LIPASE, AMYLASE in the last 168 hours. No results for input(s): AMMONIA in the last 168 hours. Cardiac Enzymes: No results for input(s): CKTOTAL,  CKMB, CKMBINDEX, TROPONINI in the last 168 hours. BNP (last 3 results) Recent Labs    05/07/21 1341  BNP 30.3   CBG: Recent Labs  Lab 05/09/21 1157 05/09/21 1606 05/09/21 2241  05/10/21 0713 05/10/21 1156  GLUCAP 216* 372* 252* 159* 269*    Time spent: 35 minutes  Signed:  Gillis Santaileep Sharonann Malbrough  [facility]  05/10/2021 1:05 PM

## 2021-08-25 ENCOUNTER — Emergency Department: Payer: No Typology Code available for payment source

## 2021-08-25 ENCOUNTER — Other Ambulatory Visit: Payer: Self-pay

## 2021-08-25 ENCOUNTER — Encounter: Payer: Self-pay | Admitting: Emergency Medicine

## 2021-08-25 DIAGNOSIS — E119 Type 2 diabetes mellitus without complications: Secondary | ICD-10-CM | POA: Diagnosis present

## 2021-08-25 DIAGNOSIS — J9601 Acute respiratory failure with hypoxia: Secondary | ICD-10-CM | POA: Diagnosis present

## 2021-08-25 DIAGNOSIS — Z886 Allergy status to analgesic agent status: Secondary | ICD-10-CM

## 2021-08-25 DIAGNOSIS — Z7982 Long term (current) use of aspirin: Secondary | ICD-10-CM

## 2021-08-25 DIAGNOSIS — Z823 Family history of stroke: Secondary | ICD-10-CM

## 2021-08-25 DIAGNOSIS — Z86718 Personal history of other venous thrombosis and embolism: Secondary | ICD-10-CM

## 2021-08-25 DIAGNOSIS — Z6841 Body Mass Index (BMI) 40.0 and over, adult: Secondary | ICD-10-CM

## 2021-08-25 DIAGNOSIS — J189 Pneumonia, unspecified organism: Secondary | ICD-10-CM | POA: Diagnosis not present

## 2021-08-25 DIAGNOSIS — Z7951 Long term (current) use of inhaled steroids: Secondary | ICD-10-CM

## 2021-08-25 DIAGNOSIS — I5033 Acute on chronic diastolic (congestive) heart failure: Secondary | ICD-10-CM | POA: Diagnosis present

## 2021-08-25 DIAGNOSIS — J441 Chronic obstructive pulmonary disease with (acute) exacerbation: Secondary | ICD-10-CM | POA: Diagnosis present

## 2021-08-25 DIAGNOSIS — Z87891 Personal history of nicotine dependence: Secondary | ICD-10-CM

## 2021-08-25 DIAGNOSIS — R7989 Other specified abnormal findings of blood chemistry: Secondary | ICD-10-CM | POA: Diagnosis not present

## 2021-08-25 DIAGNOSIS — Z20822 Contact with and (suspected) exposure to covid-19: Secondary | ICD-10-CM | POA: Diagnosis present

## 2021-08-25 DIAGNOSIS — N179 Acute kidney failure, unspecified: Secondary | ICD-10-CM | POA: Diagnosis not present

## 2021-08-25 DIAGNOSIS — Z79899 Other long term (current) drug therapy: Secondary | ICD-10-CM

## 2021-08-25 DIAGNOSIS — Z9109 Other allergy status, other than to drugs and biological substances: Secondary | ICD-10-CM

## 2021-08-25 DIAGNOSIS — G4733 Obstructive sleep apnea (adult) (pediatric): Secondary | ICD-10-CM | POA: Diagnosis present

## 2021-08-25 DIAGNOSIS — Z8249 Family history of ischemic heart disease and other diseases of the circulatory system: Secondary | ICD-10-CM

## 2021-08-25 DIAGNOSIS — I110 Hypertensive heart disease with heart failure: Secondary | ICD-10-CM | POA: Diagnosis present

## 2021-08-25 DIAGNOSIS — T502X5A Adverse effect of carbonic-anhydrase inhibitors, benzothiadiazides and other diuretics, initial encounter: Secondary | ICD-10-CM | POA: Diagnosis present

## 2021-08-25 DIAGNOSIS — J440 Chronic obstructive pulmonary disease with acute lower respiratory infection: Secondary | ICD-10-CM | POA: Diagnosis present

## 2021-08-25 DIAGNOSIS — Z7984 Long term (current) use of oral hypoglycemic drugs: Secondary | ICD-10-CM

## 2021-08-25 DIAGNOSIS — M109 Gout, unspecified: Secondary | ICD-10-CM | POA: Diagnosis present

## 2021-08-25 DIAGNOSIS — I272 Pulmonary hypertension, unspecified: Secondary | ICD-10-CM | POA: Diagnosis present

## 2021-08-25 LAB — CBC
HCT: 42.4 % (ref 39.0–52.0)
Hemoglobin: 13.7 g/dL (ref 13.0–17.0)
MCH: 25.5 pg — ABNORMAL LOW (ref 26.0–34.0)
MCHC: 32.3 g/dL (ref 30.0–36.0)
MCV: 78.8 fL — ABNORMAL LOW (ref 80.0–100.0)
Platelets: 251 10*3/uL (ref 150–400)
RBC: 5.38 MIL/uL (ref 4.22–5.81)
RDW: 17 % — ABNORMAL HIGH (ref 11.5–15.5)
WBC: 8.5 10*3/uL (ref 4.0–10.5)
nRBC: 0 % (ref 0.0–0.2)

## 2021-08-25 LAB — BASIC METABOLIC PANEL
Anion gap: 11 (ref 5–15)
BUN: 15 mg/dL (ref 6–20)
CO2: 30 mmol/L (ref 22–32)
Calcium: 9.5 mg/dL (ref 8.9–10.3)
Chloride: 96 mmol/L — ABNORMAL LOW (ref 98–111)
Creatinine, Ser: 1.26 mg/dL — ABNORMAL HIGH (ref 0.61–1.24)
GFR, Estimated: >60 mL/min (ref >60)
Glucose, Bld: 138 mg/dL — ABNORMAL HIGH (ref 70–99)
Potassium: 3.5 mmol/L (ref 3.5–5.1)
Sodium: 137 mmol/L (ref 135–145)

## 2021-08-25 LAB — TROPONIN I (HIGH SENSITIVITY): Troponin I (High Sensitivity): 6 ng/L (ref <18)

## 2021-08-25 NOTE — ED Triage Notes (Signed)
Pt via POV from home. Pt c/o cough and SOB. Pt states that he has been sick for over a month. Pt has a hx of COPD. Pt was 84% on RA and placed on 2L [place] and increased 94%. Pt is A&Ox4 and NAD. Denies pain.

## 2021-08-26 ENCOUNTER — Inpatient Hospital Stay
Admission: EM | Admit: 2021-08-26 | Discharge: 2021-08-28 | DRG: 193 | Disposition: A | Discharge status: Home / Self Care | Payer: No Typology Code available for payment source | Attending: Internal Medicine | Admitting: Internal Medicine

## 2021-08-26 ENCOUNTER — Emergency Department: Payer: No Typology Code available for payment source

## 2021-08-26 DIAGNOSIS — J449 Chronic obstructive pulmonary disease, unspecified: Secondary | ICD-10-CM

## 2021-08-26 DIAGNOSIS — Z87891 Personal history of nicotine dependence: Secondary | ICD-10-CM | POA: Diagnosis not present

## 2021-08-26 DIAGNOSIS — J441 Chronic obstructive pulmonary disease with (acute) exacerbation: Secondary | ICD-10-CM | POA: Diagnosis present

## 2021-08-26 DIAGNOSIS — J9601 Acute respiratory failure with hypoxia: Secondary | ICD-10-CM | POA: Diagnosis present

## 2021-08-26 DIAGNOSIS — Z20822 Contact with and (suspected) exposure to covid-19: Secondary | ICD-10-CM | POA: Diagnosis present

## 2021-08-26 DIAGNOSIS — J189 Pneumonia, unspecified organism: Principal | ICD-10-CM

## 2021-08-26 DIAGNOSIS — Z886 Allergy status to analgesic agent status: Secondary | ICD-10-CM | POA: Diagnosis not present

## 2021-08-26 DIAGNOSIS — M109 Gout, unspecified: Secondary | ICD-10-CM | POA: Diagnosis present

## 2021-08-26 DIAGNOSIS — E119 Type 2 diabetes mellitus without complications: Secondary | ICD-10-CM | POA: Diagnosis present

## 2021-08-26 DIAGNOSIS — Z7951 Long term (current) use of inhaled steroids: Secondary | ICD-10-CM | POA: Diagnosis not present

## 2021-08-26 DIAGNOSIS — I110 Hypertensive heart disease with heart failure: Secondary | ICD-10-CM | POA: Diagnosis present

## 2021-08-26 DIAGNOSIS — Z86718 Personal history of other venous thrombosis and embolism: Secondary | ICD-10-CM | POA: Diagnosis not present

## 2021-08-26 DIAGNOSIS — Z823 Family history of stroke: Secondary | ICD-10-CM | POA: Diagnosis not present

## 2021-08-26 DIAGNOSIS — Z7982 Long term (current) use of aspirin: Secondary | ICD-10-CM | POA: Diagnosis not present

## 2021-08-26 DIAGNOSIS — Z9109 Other allergy status, other than to drugs and biological substances: Secondary | ICD-10-CM | POA: Diagnosis not present

## 2021-08-26 DIAGNOSIS — J440 Chronic obstructive pulmonary disease with acute lower respiratory infection: Secondary | ICD-10-CM | POA: Diagnosis present

## 2021-08-26 DIAGNOSIS — A419 Sepsis, unspecified organism: Secondary | ICD-10-CM | POA: Diagnosis not present

## 2021-08-26 DIAGNOSIS — G4733 Obstructive sleep apnea (adult) (pediatric): Secondary | ICD-10-CM | POA: Diagnosis present

## 2021-08-26 DIAGNOSIS — I5033 Acute on chronic diastolic (congestive) heart failure: Secondary | ICD-10-CM | POA: Diagnosis present

## 2021-08-26 DIAGNOSIS — Z6841 Body Mass Index (BMI) 40.0 and over, adult: Secondary | ICD-10-CM | POA: Diagnosis not present

## 2021-08-26 DIAGNOSIS — N179 Acute kidney failure, unspecified: Secondary | ICD-10-CM | POA: Diagnosis not present

## 2021-08-26 DIAGNOSIS — T502X5A Adverse effect of carbonic-anhydrase inhibitors, benzothiadiazides and other diuretics, initial encounter: Secondary | ICD-10-CM | POA: Diagnosis present

## 2021-08-26 DIAGNOSIS — Z7984 Long term (current) use of oral hypoglycemic drugs: Secondary | ICD-10-CM | POA: Diagnosis not present

## 2021-08-26 DIAGNOSIS — Z8249 Family history of ischemic heart disease and other diseases of the circulatory system: Secondary | ICD-10-CM | POA: Diagnosis not present

## 2021-08-26 DIAGNOSIS — I272 Pulmonary hypertension, unspecified: Secondary | ICD-10-CM | POA: Diagnosis present

## 2021-08-26 LAB — RESPIRATORY PANEL BY PCR

## 2021-08-26 LAB — URINALYSIS, ROUTINE W REFLEX MICROSCOPIC
Bacteria, UA: NONE SEEN
Bilirubin Urine: NEGATIVE
Glucose, UA: NEGATIVE mg/dL
Hgb urine dipstick: NEGATIVE
Ketones, ur: NEGATIVE mg/dL
Leukocytes,Ua: NEGATIVE
Nitrite: NEGATIVE
Protein, ur: 100 mg/dL — AB
Specific Gravity, Urine: 1.012 (ref 1.005–1.030)
Squamous Epithelial / HPF: NONE SEEN (ref 0–5)
pH: 5 (ref 5.0–8.0)

## 2021-08-26 LAB — BRAIN NATRIURETIC PEPTIDE: B Natriuretic Peptide: 14.9 pg/mL (ref 0.0–100.0)

## 2021-08-26 LAB — CBC
HCT: 44 % (ref 39.0–52.0)
Hemoglobin: 14.3 g/dL (ref 13.0–17.0)
MCH: 25.7 pg — ABNORMAL LOW (ref 26.0–34.0)
MCHC: 32.5 g/dL (ref 30.0–36.0)
MCV: 79.1 fL — ABNORMAL LOW (ref 80.0–100.0)
Platelets: 261 10*3/uL (ref 150–400)
RBC: 5.56 MIL/uL (ref 4.22–5.81)
RDW: 17 % — ABNORMAL HIGH (ref 11.5–15.5)
WBC: 8.4 10*3/uL (ref 4.0–10.5)
nRBC: 0 % (ref 0.0–0.2)

## 2021-08-26 LAB — RESP PANEL BY RT-PCR (FLU A&B, COVID) ARPGX2
Influenza A by PCR: NEGATIVE
Influenza B by PCR: NEGATIVE
SARS Coronavirus 2 by RT PCR: NEGATIVE

## 2021-08-26 LAB — GLUCOSE, CAPILLARY
Glucose-Capillary: 314 mg/dL — ABNORMAL HIGH (ref 70–99)
Glucose-Capillary: 277 mg/dL — ABNORMAL HIGH (ref 70–99)
Glucose-Capillary: 285 mg/dL — ABNORMAL HIGH (ref 70–99)

## 2021-08-26 LAB — PROCALCITONIN: Procalcitonin: 0.14 ng/mL

## 2021-08-26 LAB — CREATININE, SERUM
Creatinine, Ser: 1.29 mg/dL — ABNORMAL HIGH (ref 0.61–1.24)
GFR, Estimated: >60 mL/min (ref >60)

## 2021-08-26 LAB — TROPONIN I (HIGH SENSITIVITY): Troponin I (High Sensitivity): 7 ng/L (ref <18)

## 2021-08-26 LAB — LACTIC ACID, PLASMA: Lactic Acid, Venous: 1.4 mmol/L (ref 0.5–1.9)

## 2021-08-26 LAB — STREP PNEUMONIAE URINARY ANTIGEN: Strep Pneumo Urinary Antigen: NEGATIVE

## 2021-08-26 MED ORDER — MAGNESIUM HYDROXIDE 400 MG/5ML PO SUSP: 30.0000 mL | Freq: Every day | ORAL | Status: DC | PRN

## 2021-08-26 MED ORDER — TRAZODONE HCL 50 MG PO TABS: 25.0000 mg | ORAL_TABLET | Freq: Every evening | ORAL | Status: DC | PRN

## 2021-08-26 MED ORDER — SODIUM CHLORIDE 0.9 % IV SOLN
500.0000 mg | INTRAVENOUS | Status: DC
  Administered 2021-08-27: 500 mg via INTRAVENOUS
  Filled 2021-08-26: qty 500

## 2021-08-26 MED ORDER — PANTOPRAZOLE SODIUM 40 MG PO TBEC
40.0000 mg | DELAYED_RELEASE_TABLET | Freq: Every day | ORAL | Status: DC
  Administered 2021-08-26 – 2021-08-28 (×3): 40 mg via ORAL
  Filled 2021-08-26 (×3): qty 1

## 2021-08-26 MED ORDER — ASPIRIN 81 MG PO CHEW
81.0000 mg | CHEWABLE_TABLET | Freq: Every day | ORAL | Status: DC
  Administered 2021-08-26 – 2021-08-28 (×3): 81 mg via ORAL
  Filled 2021-08-26 (×3): qty 1

## 2021-08-26 MED ORDER — EMPAGLIFLOZIN 10 MG PO TABS
10.0000 mg | ORAL_TABLET | Freq: Every day | ORAL | Status: DC
  Administered 2021-08-26 – 2021-08-28 (×3): 10 mg via ORAL
  Filled 2021-08-26 (×3): qty 1

## 2021-08-26 MED ORDER — SODIUM CHLORIDE 0.9 % IV SOLN
1.0000 g | Freq: Once | INTRAVENOUS | Status: AC
  Administered 2021-08-26: 1 g via INTRAVENOUS
  Filled 2021-08-26: qty 10
  Filled 2021-08-26: qty 1

## 2021-08-26 MED ORDER — METOPROLOL SUCCINATE ER 50 MG PO TB24
100.0000 mg | ORAL_TABLET | Freq: Every day | ORAL | Status: DC
  Administered 2021-08-26 – 2021-08-28 (×3): 100 mg via ORAL
  Filled 2021-08-26 (×3): qty 2

## 2021-08-26 MED ORDER — TORSEMIDE 20 MG PO TABS
40.0000 mg | ORAL_TABLET | Freq: Two times a day (BID) | ORAL | Status: DC
  Administered 2021-08-26 – 2021-08-27 (×3): 40 mg via ORAL
  Filled 2021-08-26 (×3): qty 2

## 2021-08-26 MED ORDER — ADULT MULTIVITAMIN W/MINERALS CH
1.0000 | ORAL_TABLET | Freq: Every day | ORAL | Status: DC
Start: 2021-08-26 — End: 2021-08-28
  Administered 2021-08-26 – 2021-08-28 (×3): 1 via ORAL
  Filled 2021-08-26 (×3): qty 1

## 2021-08-26 MED ORDER — MOMETASONE FURO-FORMOTEROL FUM 100-5 MCG/ACT IN AERO
2.0000 | INHALATION_SPRAY | Freq: Two times a day (BID) | RESPIRATORY_TRACT | Status: DC
  Administered 2021-08-26 – 2021-08-28 (×5): 2 via RESPIRATORY_TRACT
  Filled 2021-08-26: qty 8.8

## 2021-08-26 MED ORDER — INSULIN ASPART 100 UNIT/ML IJ SOLN
0.0000 [IU] | Freq: Three times a day (TID) | INTRAMUSCULAR | Status: DC
  Administered 2021-08-26: 11 [IU] via SUBCUTANEOUS
  Administered 2021-08-26: 15 [IU] via SUBCUTANEOUS
  Administered 2021-08-27: 4 [IU] via SUBCUTANEOUS
  Administered 2021-08-27: 7 [IU] via SUBCUTANEOUS
  Administered 2021-08-27: 11 [IU] via SUBCUTANEOUS
  Administered 2021-08-28: 3 [IU] via SUBCUTANEOUS
  Administered 2021-08-28: 4 [IU] via SUBCUTANEOUS
  Filled 2021-08-26 (×6): qty 1

## 2021-08-26 MED ORDER — COLCHICINE 0.6 MG PO TABS: 0.6000 mg | ORAL_TABLET | ORAL | Status: DC | PRN

## 2021-08-26 MED ORDER — SPIRONOLACTONE 25 MG PO TABS
25.0000 mg | ORAL_TABLET | Freq: Every day | ORAL | Status: DC
  Administered 2021-08-26 – 2021-08-27 (×2): 25 mg via ORAL
  Filled 2021-08-26 (×2): qty 1

## 2021-08-26 MED ORDER — IBUPROFEN 400 MG PO TABS: 400.0000 mg | ORAL_TABLET | Freq: Four times a day (QID) | ORAL | Status: DC | PRN

## 2021-08-26 MED ORDER — GUAIFENESIN ER 600 MG PO TB12
1200.0000 mg | ORAL_TABLET | Freq: Two times a day (BID) | ORAL | Status: DC
  Administered 2021-08-26 – 2021-08-28 (×5): 1200 mg via ORAL
  Filled 2021-08-26 (×5): qty 2

## 2021-08-26 MED ORDER — ONDANSETRON HCL 4 MG/2ML IJ SOLN: 4.0000 mg | Freq: Four times a day (QID) | INTRAMUSCULAR | Status: DC | PRN

## 2021-08-26 MED ORDER — ONDANSETRON HCL 4 MG PO TABS: 4.0000 mg | ORAL_TABLET | Freq: Four times a day (QID) | ORAL | Status: DC | PRN

## 2021-08-26 MED ORDER — LISINOPRIL 5 MG PO TABS
5.0000 mg | ORAL_TABLET | Freq: Every day | ORAL | Status: DC
  Administered 2021-08-26 – 2021-08-27 (×2): 5 mg via ORAL
  Filled 2021-08-26 (×2): qty 1

## 2021-08-26 MED ORDER — VITAMIN D 25 MCG (1000 UNIT) PO TABS
1000.0000 [IU] | ORAL_TABLET | Freq: Every day | ORAL | Status: DC
  Administered 2021-08-26 – 2021-08-28 (×3): 1000 [IU] via ORAL
  Filled 2021-08-26 (×3): qty 1

## 2021-08-26 MED ORDER — IPRATROPIUM-ALBUTEROL 0.5-2.5 (3) MG/3ML IN SOLN
3.0000 mL | RESPIRATORY_TRACT | Status: AC
  Administered 2021-08-26 (×2): 3 mL via RESPIRATORY_TRACT
  Filled 2021-08-26 (×2): qty 3

## 2021-08-26 MED ORDER — POLYSACCHARIDE IRON COMPLEX 150 MG PO CAPS
150.0000 mg | ORAL_CAPSULE | Freq: Every day | ORAL | Status: DC
  Administered 2021-08-26 – 2021-08-28 (×3): 150 mg via ORAL
  Filled 2021-08-26 (×3): qty 1

## 2021-08-26 MED ORDER — ENOXAPARIN SODIUM 80 MG/0.8ML IJ SOSY
0.5000 mg/kg | PREFILLED_SYRINGE | INTRAMUSCULAR | Status: DC
  Administered 2021-08-26 – 2021-08-28 (×3): 70 mg via SUBCUTANEOUS
  Filled 2021-08-26 (×3): qty 0.7

## 2021-08-26 MED ORDER — SODIUM CHLORIDE 0.9 % IV SOLN: INTRAVENOUS | Status: DC

## 2021-08-26 MED ORDER — ALBUTEROL SULFATE (2.5 MG/3ML) 0.083% IN NEBU: 3.0000 mL | INHALATION_SOLUTION | Freq: Four times a day (QID) | RESPIRATORY_TRACT | Status: DC | PRN

## 2021-08-26 MED ORDER — INSULIN ASPART 100 UNIT/ML IJ SOLN
0.0000 [IU] | Freq: Every day | INTRAMUSCULAR | Status: DC
  Administered 2021-08-26 – 2021-08-27 (×2): 3 [IU] via SUBCUTANEOUS
  Filled 2021-08-26 (×2): qty 1

## 2021-08-26 MED ORDER — HYDROCOD POLST-CPM POLST ER 10-8 MG/5ML PO SUER
5.0000 mL | Freq: Two times a day (BID) | ORAL | Status: DC
  Administered 2021-08-26 – 2021-08-28 (×3): 5 mL via ORAL
  Filled 2021-08-26 (×4): qty 5

## 2021-08-26 MED ORDER — INSULIN ASPART 100 UNIT/ML IJ SOLN: 0.0000 [IU] | Freq: Three times a day (TID) | INTRAMUSCULAR | Status: DC

## 2021-08-26 MED ORDER — METHYLPREDNISOLONE SODIUM SUCC 125 MG IJ SOLR
125.0000 mg | Freq: Once | INTRAMUSCULAR | Status: AC
  Administered 2021-08-26: 125 mg via INTRAVENOUS
  Filled 2021-08-26: qty 2

## 2021-08-26 MED ORDER — SODIUM CHLORIDE 0.9 % IV SOLN
2.0000 g | INTRAVENOUS | Status: DC
  Administered 2021-08-27 – 2021-08-28 (×2): 2 g via INTRAVENOUS
  Filled 2021-08-26 (×2): qty 20

## 2021-08-26 MED ORDER — ALLOPURINOL 100 MG PO TABS
100.0000 mg | ORAL_TABLET | Freq: Every day | ORAL | Status: DC
  Administered 2021-08-26 – 2021-08-28 (×3): 100 mg via ORAL
  Filled 2021-08-26 (×3): qty 1

## 2021-08-26 MED ORDER — IOHEXOL 350 MG/ML SOLN
75.0000 mL | Freq: Once | INTRAVENOUS | Status: AC | PRN
  Administered 2021-08-26: 75 mL via INTRAVENOUS

## 2021-08-26 MED ORDER — HYDROCOD POLST-CPM POLST ER 10-8 MG/5ML PO SUER: 5.0000 mL | Freq: Two times a day (BID) | ORAL | Status: DC

## 2021-08-26 MED ORDER — SODIUM CHLORIDE 0.9 % IV SOLN
500.0000 mg | Freq: Once | INTRAVENOUS | Status: AC
  Administered 2021-08-26: 500 mg via INTRAVENOUS
  Filled 2021-08-26 (×2): qty 500

## 2021-08-26 MED ORDER — SILDENAFIL CITRATE 20 MG PO TABS
20.0000 mg | ORAL_TABLET | Freq: Three times a day (TID) | ORAL | Status: DC
  Administered 2021-08-26 – 2021-08-28 (×7): 20 mg via ORAL
  Filled 2021-08-26 (×9): qty 1

## 2021-08-26 MED ORDER — FLUTICASONE PROPIONATE 50 MCG/ACT NA SUSP
1.0000 | Freq: Two times a day (BID) | NASAL | Status: DC | PRN
Start: 2021-08-26 — End: 2021-08-28
  Filled 2021-08-26: qty 16

## 2021-08-26 MED ORDER — PREDNISONE 20 MG PO TABS
40.0000 mg | ORAL_TABLET | Freq: Every day | ORAL | Status: DC
  Administered 2021-08-26 – 2021-08-28 (×3): 40 mg via ORAL
  Filled 2021-08-26 (×3): qty 2

## 2021-08-26 NOTE — H&P (Signed)
Charles Rangel   PATIENT NAME: Charles Rangel    MR#:  [id number]  DATE OF BIRTH:  [birth year]  DATE OF ADMISSION:  08/26/2021  PRIMARY CARE PHYSICIAN: [facility]   Patient is coming from: Home  REQUESTING/REFERRING PHYSICIAN: Ward, Layla Maw, MD  CHIEF COMPLAINT:   Chief Complaint  Patient presents with  . Shortness of Breath    HISTORY OF PRESENT ILLNESS:  Charles Rangel is a 45 y.o. obese African-American male male with medical history significant for HF PEF, COPD, type diabetes mellitus, hypertension, DVT, gout and obstructive sleep apnea, presented to the ER with a [place] of worsening dyspnea with associated cough mainly dry and occasionally productive of whitish sputum as well as wheezing over the last 2 to 3 days with symptoms going on intermittently over the last month.  He denies any fever or chills.  No nausea or vomiting or abdominal pain.  No chest pain or palpitations.  He is not on home oxygen.  No dysuria, oliguria or hematuria or flank pain.  ED Course: When he came to the ER blood pressure was 139/103 with respiratory to of 20 and later 21.  Pulse symmetry was down to 84% on room air and 94-95% on 2 L of O2 by nasal cannula.  Heart rate was normal and later was 104.  The patient was afebrile.  Labs revealed EKG as reviewed by me :  showed sinus tachycardia with rate 111 Imaging: 2 view chest x-ray showed vascular congestion and edema with pneumonia not excluded.  It showed increased progression of interstitial reticulonodular densities compared to previous radiograph.  Chest CTA showed bilateral streaky groundglass densities with mosaic attenuation of the lungs that may represent developing infiltrate.  Showed fatty liver cholelithiasis and no evidence for PE  The patient was given IV Rocephin and Zithromax, 125 mg of IV Solu-Medrol and duo nebs.  He will be admitted to a med monitored bed for further evaluation and management. PAST MEDICAL  HISTORY:   Past Medical History:  Diagnosis Date  . (HFpEF) heart failure with preserved ejection fraction (HCC)    a. 02/2018 Echo: EF 65-70%, mildly dil LA.  . CHF (congestive heart failure) (HCC)   . COPD (chronic obstructive pulmonary disease) (HCC)   . Diabetes mellitus without complication (HCC)   . DVT (deep venous thrombosis) (HCC)   . Gout   . Hypertension   . Morbid obesity (HCC)   . OSA (obstructive sleep apnea)     PAST SURGICAL HISTORY:   Past Surgical History:  Procedure Laterality Date  . Left leg surgery for fracture    . RIGHT HEART CATH N/A 03/20/2020   Procedure: RIGHT HEART CATH;  Surgeon: Yvonne Kendall, MD;  Location: [facility] INVASIVE CV LAB;  Service: Cardiovascular;  Laterality: N/A;    SOCIAL HISTORY:   Social History   Tobacco Use  . Smoking status: Former    Packs/day: 1.00    Years: 20.00    Pack years: 20.00    Types: Cigarettes    Quit date: 10/20/2010    Years since quitting: 10.8  . Smokeless tobacco: Never  Substance Use Topics  . Alcohol use: Not Currently    FAMILY HISTORY:   Family History  Problem Relation Age of Onset  . Hypertension Mother   . Hypertension Father   . CVA Father 70    DRUG ALLERGIES:   Allergies  Allergen Reactions  . Tylenol [Acetaminophen] Anaphylaxis  . Dog Epithelium Allergy  Skin Test Hives  . Apple Hives  . Other Hives    RAISINS    REVIEW OF SYSTEMS:   ROS As per history of present illness. All pertinent systems were reviewed above. Constitutional, HEENT, cardiovascular, respiratory, GI, GU, musculoskeletal, neuro, psychiatric, endocrine, integumentary and hematologic systems were reviewed and are otherwise negative/unremarkable except for positive findings mentioned above in the HPI.   MEDICATIONS AT HOME:   Prior to Admission medications   Medication Sig Start Date End Date Taking? Authorizing Provider  albuterol (VENTOLIN HFA) 108 (90 Base) MCG/ACT inhaler Inhale 2 puffs into the lungs  every 6 (six) hours as needed for wheezing or shortness of breath. 05/10/21  Yes Val Riles, MD  allopurinol (ZYLOPRIM) 100 MG tablet Take 100 mg by mouth daily.   Yes [provider]  aspirin 81 MG chewable tablet Chew 81 mg by mouth daily.   Yes [provider]  cholecalciferol (VITAMIN D3) 25 MCG (1000 UNIT) tablet Take 1,000 Units by mouth daily.   Yes [provider]  colchicine 0.6 MG tablet Take 0.6 mg by mouth as needed.   Yes [provider]  empagliflozin (JARDIANCE) 25 MG TABS tablet Take 12.5 mg by mouth daily. 06/18/21  Yes [provider]  fluticasone (FLONASE) 50 MCG/ACT nasal spray Place 1-2 sprays into the nose 2 (two) times daily as needed for allergies. 11/07/20  Yes [provider]  fluticasone-salmeterol (ADVAIR) 100-50 MCG/ACT AEPB Inhale 1 puff into the lungs 2 (two) times daily. 05/20/21  Yes [provider]  iron polysaccharides (NIFEREX) 150 MG capsule Take 1 capsule (150 mg total) by mouth daily. 05/11/21 08/26/21 Yes Val Riles, MD  lisinopril (ZESTRIL) 5 MG tablet Take 5 mg by mouth daily. 11/13/20  Yes [provider]  metFORMIN (GLUCOPHAGE) 1000 MG tablet Take 1,000 mg by mouth 2 (two) times daily with a meal.   Yes [provider]  metoprolol succinate (TOPROL-XL) 100 MG 24 hr tablet Take 100 mg by mouth daily. 11/13/20  Yes [provider]  Multiple Vitamins-Minerals (MULTIVITAMIN WITH MINERALS) tablet Take 1 tablet by mouth daily.   Yes [provider]  omeprazole (PRILOSEC) 20 MG capsule Take 20 mg by mouth daily before breakfast. 11/13/20  Yes [provider]  sildenafil (REVATIO) 20 MG tablet Take 20 mg by mouth 3 (three) times daily.   Yes [provider]  spironolactone (ALDACTONE) 25 MG tablet Take 1 tablet by mouth daily. 06/18/21  Yes [provider]  torsemide (DEMADEX) 20 MG tablet Take 40 mg by mouth 2 (two) times daily.   Yes [provider]      VITAL SIGNS:  Blood pressure (!) 142/89, pulse (!) 105, temperature 98.7 F (37.1 C), temperature source Oral, resp. rate 19, height 5\' 6"  (1.676 m), weight (!) 140.6 kg, SpO2 94 %.  PHYSICAL EXAMINATION:  Physical Exam  GENERAL:  45 y.o.-year-old obese African-American male patient lying in the bed with no acute distress.  EYES: Pupils equal, round, reactive to light and accommodation. No scleral icterus. Extraocular muscles intact.  HEENT: Head atraumatic, normocephalic. Oropharynx and nasopharynx clear.  NECK:  Supple, no jugular venous distention. No thyroid enlargement, no tenderness.  LUNGS: Diminished bibasilar breath sounds with bibasal and midlung zone crackles with diminished expiratory airflow and harsh physical breathing with occasional rhonchi and wheezes.  No use of accessory muscles of respiration.  CARDIOVASCULAR: Regular rate and rhythm, S1, S2 normal. No murmurs, rubs, or gallops.  ABDOMEN: Soft, nondistended, nontender.  Bowel sounds present. No organomegaly or mass.  EXTREMITIES: No pedal edema, cyanosis, or clubbing.  NEUROLOGIC: Cranial nerves II through XII are intact. Muscle strength 5/5 in all extremities. Sensation intact. Gait not checked.  PSYCHIATRIC: The patient is alert and oriented x 3.  Normal affect and good eye contact. SKIN: No obvious rash, lesion, or ulcer.   LABORATORY PANEL:   CBC Recent Labs  Lab 08/25/21 1818  WBC 8.5  HGB 13.7  HCT 42.4  PLT 251   ------------------------------------------------------------------------------------------------------------------  Chemistries  Recent Labs  Lab 08/25/21 1818  NA 137  K 3.5  CL 96*  CO2 30  GLUCOSE 138*  BUN 15  CREATININE 1.26*  CALCIUM 9.5   ------------------------------------------------------------------------------------------------------------------  Cardiac Enzymes No results for input(s): TROPONINI in the last 168  hours. ------------------------------------------------------------------------------------------------------------------  RADIOLOGY:  DG Chest 2 View  Result Date: 08/25/2021 CLINICAL DATA:  Shortness of breath. EXAM: CHEST - 2 VIEW COMPARISON:  Chest radiograph dated 05/07/2021. FINDINGS: Cardiomegaly with vascular congestion and edema bilateral confluent and nodular opacities as seen on the prior radiograph may represent edema or pneumonia. No large pleural effusion. No pneumothorax. No acute osseous pathology. IMPRESSION: Enlarged cardiac silhouette with vascular congestion and edema. Pneumonia is not excluded. Overall interval progression of interstitial reticulonodular densities compared to prior radiograph. Electronically Signed   By: Anner Crete M.D.   On: 08/25/2021 19:11   CT Angio Chest PE W and/or Wo Contrast  Result Date: 08/26/2021 CLINICAL DATA:  Concern for pulmonary embolism. EXAM: CT ANGIOGRAPHY CHEST WITH CONTRAST TECHNIQUE: Multidetector CT imaging of the chest was performed using the standard protocol during bolus administration of intravenous contrast. Multiplanar CT image reconstructions and MIPs were obtained to evaluate the vascular anatomy. CONTRAST:  51mL OMNIPAQUE IOHEXOL 350 MG/ML SOLN COMPARISON:  Radiograph dated 08/25/2021 and chest CT dated 12/22/2019. FINDINGS: Evaluation of this exam is limited due to respiratory motion artifact. Cardiovascular: There is no cardiomegaly or pericardial effusion. The thoracic aorta is unremarkable. The origins of the great vessels of the aortic arch appear patent as visualized. Evaluation of the pulmonary arteries is limited due to respiratory motion artifact and suboptimal opacification and timing of the contrast. No pulmonary artery embolus identified. Mediastinum/Nodes: Top-normal hilar and mediastinal lymph nodes. Similar appearance of nodular density to the left of the trachea in the aortopulmonic window, likely a cluster of lymph  nodes. The esophagus is grossly unremarkable. No mediastinal fluid collection. Lungs/Pleura: Bilateral streaky and ground-glass densities with mosaic attenuation of the lungs. Interval progression of streaky densities since the prior CT which may represent scarring or developing infiltrate. No lobar consolidation. There is no pleural effusion pneumothorax. The central airways are patent. Upper Abdomen: Fatty liver.  Multiple gallstones. Musculoskeletal: Degenerative changes of the spine. No acute osseous pathology. Review of the MIP images confirms the above findings. IMPRESSION: 1. No CT evidence of pulmonary embolism. 2. Bilateral streaky and ground-glass densities with mosaic attenuation of the lungs. Interval progression of streaky densities since the prior CT which may represent scarring or developing infiltrate. 3. Fatty liver. 4. Cholelithiasis. Electronically Signed   By: Anner Crete M.D.   On: 08/26/2021 03:34      IMPRESSION AND PLAN:  Active Problems:   CAP (community acquired pneumonia)  1.  Bilateral community acquired pneumonia with subsequent sepsis as manifested by tachycardia and tachypnea. - The patient will be admitted to a medical monitored bed. - We will continue antibiotic therapy with IV Rocephin and Zithromax. - Mucolytic therapy will be provided. -  Bronchodilator therapy be provided 4 times daily and every 4 hours as needed with DuoNebs. - We will follow blood and sputum culture. - We will check pneumonia antigens.  2.  COPD exacerbation secondary to #1. - We will place the patient on steroid therapy with IV Solu-Medrol. - Bronchodilator therapy will be provided with duo nebs.  3.  Acute hypoxic respiratory failure secondary to #1 and 2. - O2 protocol will be followed.    DVT prophylaxis: Lovenox. Code Status: full code. Family Communication.:  The plan of care was discussed in details with the patient (and family). I answered all questions. The patient  agreed to proceed with the above mentioned plan. Further management will depend upon hospital course. Disposition Plan: Back to previous home environment Consults called: none. All the records are reviewed and case discussed with ED provider.  Status is: Inpatient   Remains inpatient appropriate because:Ongoing diagnostic testing needed not appropriate for outpatient work up, Unsafe d/c plan, IV treatments appropriate due to intensity of illness or inability to take PO, and Inpatient level of care appropriate due to severity of illness   Dispo: The patient is from: Home              Anticipated d/c is to: Home              Patient currently is not medically stable to d/c.              Difficult to place patient: No  TOTAL TIME TAKING CARE OF THIS PATIENT: 55 minutes.     Christel Mormon M.D on 08/26/2021 at 5:23 AM  [facility]   From 7 PM-7 AM, contact night-coverage [email or web address]  CC: Primary care physician; [facility]

## 2021-08-26 NOTE — Progress Notes (Addendum)
PROGRESS NOTE    Charles Rangel  KGM:[id number] DOB: May 05, 1976 DOA: 08/26/2021 PCP: [facility]   Chief Complain: Shortness of breath  Brief Narrative: Patient is a 45 year old male with history of heart failure with preserved ejection fraction, COPD, type 2 diabetes, hypertension, DVT, gout, OSA who presented with worsening dyspnea, cough with whitish sputum.  On presentation he was hypertensive, he was hypoxic on room air.  Does not use oxygen at home.  Chest x-ray showed vascular congestion, edema.  Chest CT showed bilateral streaky groundglass opacities suspicious for infiltrate.  Patient was admitted for the management of COPD exacerbation/possible superimposed pneumonia.  Assessment & Plan:   Active Problems:   CAP (community acquired pneumonia)   Acute respiratory failure with hypoxia/COPD exacerbation: Presented with cough, shortness of breath.  Not on oxygen at home.  Past smoker.  Has history of COPD. Started on IV steroids, bronchodilators, supplemental oxygen. Continue incentive spirometry, will try to wean the oxygen.  He follows with pulmonology at [facility].  He is a past smoker.  Possible superimposed community-acquired pneumonia:Chest CT showed bilateral streaky groundglass opacities suspicious for infiltrate.  Currently on ceftriaxone azithromycin.  Patient is afebrile.  No leukocytosis. Continue current medications  History of pulmonary hypertension: On Sildenafil  History of diastolic congestive heart failure: Looks volume overloaded.  Fluid stopped  Continue torsemide at home dose.  Echo done on 04/2020 showed EF of 60 to 65%, indeterminate left ventricular diastolic parameters.  Has bilateral lower extremity edema.  History of hypertension: On lisinopril, metoprolol.  Continue current medications  History of gout: On colchicine, allopurinol  Diabetes type 2: On Jardiance and metformin at home.  Continue sliding scale insulin here.  Monitor blood  sugars  Elevated creatinine: His baseline creatinine ranged from 1.2-1.4.  Currently kidney function at baseline  Morbid obesity: BMI of 49.5             DVT prophylaxis:Lovenox Code Status: Full Family Communication: None at bedside Status is: Inpatient  Remains inpatient appropriate because: Requiring oxygen supplementation for acute hypoxic respiratory failure      Consultants: None  Procedures:None  Antimicrobials:  Anti-infectives (From admission, onward)    Start     Dose/Rate Route Frequency Ordered Stop   08/27/21 1000  azithromycin (ZITHROMAX) 500 mg in sodium chloride 0.9 % 250 mL IVPB        500 mg 250 mL/hr over 60 Minutes Intravenous Every 24 hours 08/26/21 0522 09/01/21 0959   08/27/21 0900  cefTRIAXone (ROCEPHIN) 2 g in sodium chloride 0.9 % 100 mL IVPB        2 g 200 mL/hr over 30 Minutes Intravenous Every 24 hours 08/26/21 0522 09/01/21 0859   08/26/21 0430  cefTRIAXone (ROCEPHIN) 1 g in sodium chloride 0.9 % 100 mL IVPB        1 g 200 mL/hr over 30 Minutes Intravenous  Once 08/26/21 0418 08/26/21 1018   08/26/21 0430  azithromycin (ZITHROMAX) 500 mg in sodium chloride 0.9 % 250 mL IVPB        500 mg 250 mL/hr over 60 Minutes Intravenous  Once 08/26/21 0418 08/26/21 0902          Objective: Vitals:   08/26/21 0230 08/26/21 0500 08/26/21 0600 08/26/21 0651  BP: (!) 137/94 (!) 142/89 133/88 (!) 151/98  Pulse: (!) 104 (!) 105 (!) 103 (!) 110  Resp:  19 (!) 21 20  Temp:   98.9 F (37.2 C) 97.8 F (36.6 C)  TempSrc:  Oral Oral  SpO2: 93% 94% 95% 94%  Weight:    (!) 143.6 kg  Height:    5\' 7"  (1.702 m)    Intake/Output Summary (Last 24 hours) at 08/26/2021 1121 Last data filed at 08/26/2021 1025 Gross per 24 hour  Intake 240 ml  Output 1750 ml  Net -1510 ml   Filed Weights   08/25/21 1809 08/26/21 0651  Weight: (!) 140.6 kg (!) 143.6 kg    Examination:  General exam: Overall comfortable, not in distress, morbidly  obese HEENT: PERRL Respiratory system: Diminished air sounds bilaterally, no wheezes or crackles  Cardiovascular system: S1 & S2 heard, RRR.  Gastrointestinal system: Abdomen is nondistended, soft and nontender. Central nervous system: Alert and oriented Extremities: 1-2+ bilateral pitting pedal edema, no clubbing ,no cyanosis Skin: No rashes, no ulcers,no icterus      Data Reviewed: I have personally reviewed following labs and imaging studies  CBC: Recent Labs  Lab 08/25/21 1818 08/26/21 0616  WBC 8.5 8.4  HGB 13.7 14.3  HCT 42.4 44.0  MCV 78.8* 79.1*  PLT 251 261   Basic Metabolic Panel: Recent Labs  Lab 08/25/21 1818 08/26/21 0616  NA 137  --   K 3.5  --   CL 96*  --   CO2 30  --   GLUCOSE 138*  --   BUN 15  --   CREATININE 1.26* 1.29*  CALCIUM 9.5  --    GFR: Estimated Creatinine Clearance: 98.3 mL/min (A) (by C-G formula based on SCr of 1.29 mg/dL (H)). Liver Function Tests: No results for input(s): AST, ALT, ALKPHOS, BILITOT, PROT, ALBUMIN in the last 168 hours. No results for input(s): LIPASE, AMYLASE in the last 168 hours. No results for input(s): AMMONIA in the last 168 hours. Coagulation Profile: No results for input(s): INR, PROTIME in the last 168 hours. Cardiac Enzymes: No results for input(s): CKTOTAL, CKMB, CKMBINDEX, TROPONINI in the last 168 hours. BNP (last 3 results) No results for input(s): PROBNP in the last 8760 hours. HbA1C: No results for input(s): HGBA1C in the last 72 hours. CBG: No results for input(s): GLUCAP in the last 168 hours. Lipid Profile: No results for input(s): CHOL, HDL, LDLCALC, TRIG, CHOLHDL, LDLDIRECT in the last 72 hours. Thyroid Function Tests: No results for input(s): TSH, T4TOTAL, FREET4, T3FREE, THYROIDAB in the last 72 hours. Anemia Panel: No results for input(s): VITAMINB12, FOLATE, FERRITIN, TIBC, IRON, RETICCTPCT in the last 72 hours. Sepsis Labs: Recent Labs  Lab 08/26/21 0048 08/26/21 0058   PROCALCITON 0.14  --   LATICACIDVEN  --  1.4    Recent Results (from the past 240 hour(s))  Respiratory (~20 pathogens) panel by PCR     Status: None   Collection Time: 08/26/21 12:44 AM   Specimen: Nasopharyngeal Swab; Respiratory  Result Value Ref Range Status   Adenovirus NOT DETECTED NOT DETECTED Final   Coronavirus 229E NOT DETECTED NOT DETECTED Final    Comment: (NOTE) The Coronavirus on the Respiratory Panel, DOES NOT test for the novel  Coronavirus (2019 nCoV)    Coronavirus HKU1 NOT DETECTED NOT DETECTED Final   Coronavirus NL63 NOT DETECTED NOT DETECTED Final   Coronavirus OC43 NOT DETECTED NOT DETECTED Final   Metapneumovirus NOT DETECTED NOT DETECTED Final   Rhinovirus / Enterovirus NOT DETECTED NOT DETECTED Final   Influenza A NOT DETECTED NOT DETECTED Final   Influenza B NOT DETECTED NOT DETECTED Final   Parainfluenza Virus 1 NOT DETECTED NOT DETECTED Final   Parainfluenza  Virus 2 NOT DETECTED NOT DETECTED Final   Parainfluenza Virus 3 NOT DETECTED NOT DETECTED Final   Parainfluenza Virus 4 NOT DETECTED NOT DETECTED Final   Respiratory Syncytial Virus NOT DETECTED NOT DETECTED Final   Bordetella pertussis NOT DETECTED NOT DETECTED Final   Bordetella Parapertussis NOT DETECTED NOT DETECTED Final   Chlamydophila pneumoniae NOT DETECTED NOT DETECTED Final   Mycoplasma pneumoniae NOT DETECTED NOT DETECTED Final    Comment: Performed at [facility], [address], [place], [place] [address]  Resp Panel by RT-PCR (Flu A&B, Covid) Nasopharyngeal Swab     Status: None   Collection Time: 08/26/21  1:07 AM   Specimen: Nasopharyngeal Swab; Nasopharyngeal(NP) swabs in vial transport medium  Result Value Ref Range Status   SARS Coronavirus 2 by RT PCR NEGATIVE NEGATIVE Final    Comment: (NOTE) SARS-CoV-2 target nucleic acids are NOT DETECTED.  The SARS-CoV-2 RNA is generally detectable in upper respiratory specimens during the acute phase of infection. The  lowest concentration of SARS-CoV-2 viral copies this assay can detect is 138 copies/mL. A negative result does not preclude SARS-Cov-2 infection and should not be used as the sole basis for treatment or other patient management decisions. A negative result may occur with  improper specimen collection/handling, submission of specimen other than nasopharyngeal swab, presence of viral mutation(s) within the areas targeted by this assay, and inadequate number of viral copies(<138 copies/mL). A negative result must be combined with clinical observations, patient history, and epidemiological information. The expected result is Negative.  Fact Sheet for Patients:  BloggerCourse.com  Fact Sheet for Healthcare Providers:  SeriousBroker.it  This test is no t yet approved or cleared by the [place] FDA and  has been authorized for detection and/or diagnosis of SARS-CoV-2 by FDA under an Emergency Use Authorization (EUA). This EUA will remain  in effect (meaning this test can be used) for the duration of the COVID-19 declaration under Section 564(b)(1) of the Act, 21 U.S.C.section 360bbb-3(b)(1), unless the authorization is terminated  or revoked sooner.       Influenza A by PCR NEGATIVE NEGATIVE Final   Influenza B by PCR NEGATIVE NEGATIVE Final    Comment: (NOTE) The Xpert Xpress SARS-CoV-2/FLU/RSV plus assay is intended as an aid in the diagnosis of influenza from Nasopharyngeal swab specimens and should not be used as a sole basis for treatment. Nasal washings and aspirates are unacceptable for Xpert Xpress SARS-CoV-2/FLU/RSV testing.  Fact Sheet for Patients: BloggerCourse.com  Fact Sheet for Healthcare Providers: SeriousBroker.it  This test is not yet approved or cleared by the [place] FDA and has been authorized for detection and/or diagnosis of SARS-CoV-2 by FDA under  an Emergency Use Authorization (EUA). This EUA will remain in effect (meaning this test can be used) for the duration of the COVID-19 declaration under Section 564(b)(1) of the Act, 21 U.S.C. section 360bbb-3(b)(1), unless the authorization is terminated or revoked.  Performed at [facility], [address], [place], [place] [address]          Radiology Studies: DG Chest 2 View  Result Date: 08/25/2021 CLINICAL DATA:  Shortness of breath. EXAM: CHEST - 2 VIEW COMPARISON:  Chest radiograph dated 05/07/2021. FINDINGS: Cardiomegaly with vascular congestion and edema bilateral confluent and nodular opacities as seen on the prior radiograph may represent edema or pneumonia. No large pleural effusion. No pneumothorax. No acute osseous pathology. IMPRESSION: Enlarged cardiac silhouette with vascular congestion and edema. Pneumonia is not excluded. Overall interval progression  of interstitial reticulonodular densities compared to prior radiograph. Electronically Signed   By: Elgie Collard M.D.   On: 08/25/2021 19:11   CT Angio Chest PE W and/or Wo Contrast  Result Date: 08/26/2021 CLINICAL DATA:  Concern for pulmonary embolism. EXAM: CT ANGIOGRAPHY CHEST WITH CONTRAST TECHNIQUE: Multidetector CT imaging of the chest was performed using the standard protocol during bolus administration of intravenous contrast. Multiplanar CT image reconstructions and MIPs were obtained to evaluate the vascular anatomy. CONTRAST:  5mL OMNIPAQUE IOHEXOL 350 MG/ML SOLN COMPARISON:  Radiograph dated 08/25/2021 and chest CT dated 12/22/2019. FINDINGS: Evaluation of this exam is limited due to respiratory motion artifact. Cardiovascular: There is no cardiomegaly or pericardial effusion. The thoracic aorta is unremarkable. The origins of the great vessels of the aortic arch appear patent as visualized. Evaluation of the pulmonary arteries is limited due to respiratory motion artifact and suboptimal opacification  and timing of the contrast. No pulmonary artery embolus identified. Mediastinum/Nodes: Top-normal hilar and mediastinal lymph nodes. Similar appearance of nodular density to the left of the trachea in the aortopulmonic window, likely a cluster of lymph nodes. The esophagus is grossly unremarkable. No mediastinal fluid collection. Lungs/Pleura: Bilateral streaky and ground-glass densities with mosaic attenuation of the lungs. Interval progression of streaky densities since the prior CT which may represent scarring or developing infiltrate. No lobar consolidation. There is no pleural effusion pneumothorax. The central airways are patent. Upper Abdomen: Fatty liver.  Multiple gallstones. Musculoskeletal: Degenerative changes of the spine. No acute osseous pathology. Review of the MIP images confirms the above findings. IMPRESSION: 1. No CT evidence of pulmonary embolism. 2. Bilateral streaky and ground-glass densities with mosaic attenuation of the lungs. Interval progression of streaky densities since the prior CT which may represent scarring or developing infiltrate. 3. Fatty liver. 4. Cholelithiasis. Electronically Signed   By: Elgie Collard M.D.   On: 08/26/2021 03:34        Scheduled Meds:  allopurinol  100 mg Oral Daily   aspirin  81 mg Oral Daily   cholecalciferol  1,000 Units Oral Daily   empagliflozin  10 mg Oral Daily   enoxaparin (LOVENOX) injection  0.5 mg/kg Subcutaneous Q24H   guaiFENesin  1,200 mg Oral BID   iron polysaccharides  150 mg Oral Daily   lisinopril  5 mg Oral Daily   metoprolol succinate  100 mg Oral Daily   mometasone-formoterol  2 puff Inhalation BID   multivitamin with minerals  1 tablet Oral Daily   pantoprazole  40 mg Oral Daily   sildenafil  20 mg Oral TID   spironolactone  25 mg Oral Daily   torsemide  40 mg Oral BID   Continuous Infusions:  [START ON 08/27/2021] azithromycin     [START ON 08/27/2021] cefTRIAXone (ROCEPHIN)  IV       LOS: 0 days    Time  spent: More than 50% of that time was spent in counseling and/or coordination of care.      Burnadette Pop, MD [facility] P11/04/2021, 11:21 AM

## 2021-08-26 NOTE — Progress Notes (Signed)
PHARMACIST - PHYSICIAN COMMUNICATION  CONCERNING:  Enoxaparin (Lovenox) for DVT Prophylaxis    RECOMMENDATION: Patient was prescribed enoxaprin 40mg  q24 hours for VTE prophylaxis.   Filed Weights   08/25/21 1809  Weight: (!) 140.6 kg (310 lb)    Body mass index is 50.04 kg/m.  Estimated Creatinine Clearance: 97.9 mL/min (A) (by C-G formula based on SCr of 1.26 mg/dL (H)).   Based on [facility] policy patient is candidate for enoxaparin 0.5mg /kg TBW SQ every 24 hours based on BMI being >30.  DESCRIPTION: Pharmacy has adjusted enoxaparin dose per [facility] policy.  Patient is now receiving enoxaparin 0.5 mg/kg every 24 hours   [facility], PharmD, [facility] 08/26/2021 6:11 AM

## 2021-08-26 NOTE — ED Notes (Signed)
Dr. Ward at the bedside.  

## 2021-08-26 NOTE — ED Provider Notes (Signed)
[facility] Emergency Department Provider Note  ____________________________________________   Event Date/Time   First MD Initiated Contact with Patient 08/26/21 0023     (approximate)  I have reviewed the triage vital signs and the nursing notes.   HISTORY  Chief Complaint Shortness of Breath    HPI Charles Rangel is a 45 y.o. male with history of CHF, COPD, DVT, hypertension, diabetes, sleep apnea who presents to the emergency department with sinus pressure, nasal congestion and sinus drainage, nonproductive cough for the past month.  States he has become progressively more short of breath especially with exertion.  Does not wear oxygen chronically.  No history of PE.  Denies any lower extremity swelling or pain.  No fever.  No known sick contacts.  Denies having any chest pain other than some soreness when he is coughing.        Past Medical History:  Diagnosis Date   (HFpEF) heart failure with preserved ejection fraction ([place])    a. 02/2018 Echo: EF 65-70%, mildly dil LA.   CHF (congestive heart failure) (HCC)    COPD (chronic obstructive pulmonary disease) (HCC)    Diabetes mellitus without complication (HCC)    DVT (deep venous thrombosis) (HCC)    Gout    Hypertension    Morbid obesity (HCC)    OSA (obstructive sleep apnea)     Patient Active Problem List   Diagnosis Date Noted   CAP (community acquired pneumonia) 08/26/2021   Respiratory failure with hypoxia ([place]) 05/08/2021   Acute hypoxemic respiratory failure ([place]) 05/07/2021   Obesity, Class III, BMI 40-49.9 (morbid obesity) ([place]) 05/07/2021   OSA (obstructive sleep apnea) 05/07/2021   GERD (gastroesophageal reflux disease) 05/05/2020   Acute on chronic respiratory failure with hypoxia ([place]) 05/05/2020   Acute on chronic diastolic CHF (congestive heart failure) ([place]) 05/05/2020   Hypokalemia 05/05/2020   Acute on chronic diastolic (congestive) heart failure ([place]) 05/05/2020    Acute on chronic respiratory failure with hypoxia and hypercapnia (HCC)    COPD with acute exacerbation ([place])    Pulmonary hypertension, unspecified ([place])    CKD stage 2 due to type 2 diabetes mellitus ([place])    Morbid obesity with BMI of 50.0-59.9, adult ([place]) 12/22/2019   HTN (hypertension) 11/23/2018   DM (diabetes mellitus) ([place]) 11/23/2018   Gout 11/16/2018   CHF (congestive heart failure) ([place]) 11/14/2018   Acute on chronic heart failure with preserved ejection fraction (HFpEF) ([place]) 06/28/2018    Past Surgical History:  Procedure Laterality Date   Left leg surgery for fracture     RIGHT HEART CATH N/A 03/20/2020   Procedure: RIGHT HEART CATH;  Surgeon: Nelva Bush, MD;  Location: [facility];  Service: Cardiovascular;  Laterality: N/A;    Prior to Admission medications   Medication Sig Start Date End Date Taking? Authorizing Provider  albuterol (VENTOLIN HFA) 108 (90 Base) MCG/ACT inhaler Inhale 2 puffs into the lungs every 6 (six) hours as needed for wheezing or shortness of breath. 05/10/21  Yes Val Riles, MD  allopurinol (ZYLOPRIM) 100 MG tablet Take 100 mg by mouth daily.   Yes [provider]  aspirin 81 MG chewable tablet Chew 81 mg by mouth daily.   Yes [provider]  cholecalciferol (VITAMIN D3) 25 MCG (1000 UNIT) tablet Take 1,000 Units by mouth daily.   Yes [provider]  colchicine 0.6 MG tablet Take 0.6 mg by mouth as needed.   Yes [provider]  empagliflozin (  JARDIANCE) 25 MG TABS tablet Take 12.5 mg by mouth daily. 06/18/21  Yes [provider]  fluticasone (FLONASE) 50 MCG/ACT nasal spray Place 1-2 sprays into the nose 2 (two) times daily as needed for allergies. 11/07/20  Yes [provider]  fluticasone-salmeterol (ADVAIR) 100-50 MCG/ACT AEPB Inhale 1 puff into the lungs 2 (two) times daily. 05/20/21  Yes [provider]  iron polysaccharides (NIFEREX) 150 MG capsule Take 1 capsule  (150 mg total) by mouth daily. 05/11/21 08/26/21 Yes Val Riles, MD  lisinopril (ZESTRIL) 5 MG tablet Take 5 mg by mouth daily. 11/13/20  Yes [provider]  metFORMIN (GLUCOPHAGE) 1000 MG tablet Take 1,000 mg by mouth 2 (two) times daily with a meal.   Yes [provider]  metoprolol succinate (TOPROL-XL) 100 MG 24 hr tablet Take 100 mg by mouth daily. 11/13/20  Yes [provider]  Multiple Vitamins-Minerals (MULTIVITAMIN WITH MINERALS) tablet Take 1 tablet by mouth daily.   Yes [provider]  omeprazole (PRILOSEC) 20 MG capsule Take 20 mg by mouth daily before breakfast. 11/13/20  Yes [provider]  sildenafil (REVATIO) 20 MG tablet Take 20 mg by mouth 3 (three) times daily.   Yes [provider]  spironolactone (ALDACTONE) 25 MG tablet Take 1 tablet by mouth daily. 06/18/21  Yes [provider]  torsemide (DEMADEX) 20 MG tablet Take 40 mg by mouth 2 (two) times daily.   Yes [provider]    Allergies Tylenol [acetaminophen], Dog epithelium allergy skin test, Apple, and Other  Family History  Problem Relation Age of Onset   Hypertension Mother    Hypertension Father    CVA Father 47    Social History Social History   Tobacco Use   Smoking status: Former    Packs/day: 1.00    Years: 20.00    Pack years: 20.00    Types: Cigarettes    Quit date: 10/20/2010    Years since quitting: 10.8   Smokeless tobacco: Never  Substance Use Topics   Alcohol use: Not Currently   Drug use: No    Review of Systems Constitutional: No fever. Eyes: No visual changes. ENT: No sore throat. Cardiovascular: Denies chest pain. Respiratory: + shortness of breath. Gastrointestinal: No nausea, vomiting, diarrhea. Genitourinary: Negative for dysuria. Musculoskeletal: Negative for back pain. Skin: Negative for rash. Neurological: Negative for focal weakness or  numbness.  ____________________________________________   PHYSICAL EXAM:  VITAL SIGNS: ED Triage Vitals  Enc Vitals Group     BP 08/25/21 1808 (!) 144/100     Pulse Rate 08/25/21 1808 (!) 112     Resp 08/25/21 1808 (!) 24     Temp 08/25/21 1808 98.8 F (37.1 C)     Temp Source 08/25/21 1808 Oral     SpO2 08/25/21 1808 (!) 84 %     Weight 08/25/21 1809 (!) 310 lb (140.6 kg)     Height 08/25/21 1809 5\' 6"  (1.676 m)     Head Circumference --      Peak Flow --      Pain Score 08/25/21 1809 0     Pain Loc --      Pain Edu? --      Excl. in [place]? --    CONSTITUTIONAL: Alert and oriented and responds appropriately to questions.  Obese HEAD: Normocephalic EYES: Conjunctivae clear, pupils appear equal, EOM appear intact ENT: normal nose; moist mucous membranes NECK: Supple, normal ROM CARD: Regular and tachycardic; S1 and S2 appreciated;  no murmurs, no clicks, no rubs, no gallops RESP: he has some mild tachypnea.  Seems more short of breath when talking.  He was hypoxic on room air on arrival.  Diminished aeration at his bases bilaterally but no rhonchi, wheezing or rales. ABD/GI: Normal bowel sounds; non-distended; soft, non-tender, no rebound, no guarding, no peritoneal signs, no hepatosplenomegaly BACK: The back appears normal EXT: Normal ROM in all joints; no deformity noted, no edema; no cyanosis, no calf tenderness or calf swelling SKIN: Normal color for age and race; warm; no rash on exposed skin NEURO: Moves all extremities equally PSYCH: The patient's mood and manner are appropriate.  ____________________________________________   LABS (all labs ordered are listed, but only abnormal results are displayed)  Labs Reviewed  BASIC METABOLIC PANEL - Abnormal; Notable for the following components:      Result Value   Chloride 96 (*)    Glucose, Bld 138 (*)    Creatinine, Ser 1.26 (*)    All other components within normal limits  CBC - Abnormal; Notable for the following  components:   MCV 78.8 (*)    MCH 25.5 (*)    RDW 17.0 (*)    All other components within normal limits  URINALYSIS, ROUTINE W REFLEX MICROSCOPIC - Abnormal; Notable for the following components:   Color, Urine YELLOW (*)    APPearance CLEAR (*)    Protein, ur 100 (*)    All other components within normal limits  RESP PANEL BY RT-PCR (FLU A&B, COVID) ARPGX2  CULTURE, BLOOD (ROUTINE X 2)  CULTURE, BLOOD (ROUTINE X 2)  RESPIRATORY PANEL BY PCR  BRAIN NATRIURETIC PEPTIDE  PROCALCITONIN  LACTIC ACID, PLASMA  TROPONIN I (HIGH SENSITIVITY)  TROPONIN I (HIGH SENSITIVITY)   ____________________________________________  EKG   EKG Interpretation  Date/Time:  Sunday August 25 2021 18:17:53 EST Ventricular Rate:  111 PR Interval:  130 QRS Duration: 92 QT Interval:  346 QTC Calculation: 470 R Axis:   63 Text Interpretation: Sinus tachycardia Otherwise normal ECG Confirmed by Rochele Raring [phone]) on 08/26/2021 12:38:02 AM        ____________________________________________  RADIOLOGY Normajean Baxter Lynzi Meulemans, personally viewed and evaluated these images (plain radiographs) as part of my medical decision making, as well as reviewing the written report by the radiologist.  ED MD interpretation: CT shows no PE, pulmonary edema. Shows bilateral lower lobe infiltrate.  Official radiology report(s): DG Chest 2 View  Result Date: 08/25/2021 CLINICAL DATA:  Shortness of breath. EXAM: CHEST - 2 VIEW COMPARISON:  Chest radiograph dated 05/07/2021. FINDINGS: Cardiomegaly with vascular congestion and edema bilateral confluent and nodular opacities as seen on the prior radiograph may represent edema or pneumonia. No large pleural effusion. No pneumothorax. No acute osseous pathology. IMPRESSION: Enlarged cardiac silhouette with vascular congestion and edema. Pneumonia is not excluded. Overall interval progression of interstitial reticulonodular densities compared to prior radiograph. Electronically  Signed   By: Elgie Collard M.D.   On: 08/25/2021 19:11   CT Angio Chest PE W and/or Wo Contrast  Result Date: 08/26/2021 CLINICAL DATA:  Concern for pulmonary embolism. EXAM: CT ANGIOGRAPHY CHEST WITH CONTRAST TECHNIQUE: Multidetector CT imaging of the chest was performed using the standard protocol during bolus administration of intravenous contrast. Multiplanar CT image reconstructions and MIPs were obtained to evaluate the vascular anatomy. CONTRAST:  37mL OMNIPAQUE IOHEXOL 350 MG/ML SOLN COMPARISON:  Radiograph dated 08/25/2021 and chest CT dated 12/22/2019. FINDINGS: Evaluation of this exam is limited due to respiratory motion artifact. Cardiovascular: There  is no cardiomegaly or pericardial effusion. The thoracic aorta is unremarkable. The origins of the great vessels of the aortic arch appear patent as visualized. Evaluation of the pulmonary arteries is limited due to respiratory motion artifact and suboptimal opacification and timing of the contrast. No pulmonary artery embolus identified. Mediastinum/Nodes: Top-normal hilar and mediastinal lymph nodes. Similar appearance of nodular density to the left of the trachea in the aortopulmonic window, likely a cluster of lymph nodes. The esophagus is grossly unremarkable. No mediastinal fluid collection. Lungs/Pleura: Bilateral streaky and ground-glass densities with mosaic attenuation of the lungs. Interval progression of streaky densities since the prior CT which may represent scarring or developing infiltrate. No lobar consolidation. There is no pleural effusion pneumothorax. The central airways are patent. Upper Abdomen: Fatty liver.  Multiple gallstones. Musculoskeletal: Degenerative changes of the spine. No acute osseous pathology. Review of the MIP images confirms the above findings. IMPRESSION: 1. No CT evidence of pulmonary embolism. 2. Bilateral streaky and ground-glass densities with mosaic attenuation of the lungs. Interval progression of  streaky densities since the prior CT which may represent scarring or developing infiltrate. 3. Fatty liver. 4. Cholelithiasis. Electronically Signed   By: Anner Crete M.D.   On: 08/26/2021 03:34    ____________________________________________   PROCEDURES  Procedure(s) performed (including Critical Care):  Procedures  CRITICAL CARE Performed by: Cyril Mourning Glinda Natzke   Total critical care time: 50 minutes  Critical care time was exclusive of separately billable procedures and treating other patients.  Critical care was necessary to treat or prevent imminent or life-threatening deterioration.  Critical care was time spent personally by me on the following activities: development of treatment plan with patient and/or surrogate as well as nursing, discussions with consultants, evaluation of patient's response to treatment, examination of patient, obtaining history from patient or surrogate, ordering and performing treatments and interventions, ordering and review of laboratory studies, ordering and review of radiographic studies, pulse oximetry and re-evaluation of patient's condition.  ____________________________________________   INITIAL IMPRESSION / ASSESSMENT AND PLAN / ED COURSE  As part of my medical decision making, I reviewed the following data within the [facility] notes reviewed and incorporated, Labs reviewed , EKG interpreted , Old EKG reviewed, Old chart reviewed, Radiograph reviewed , Discussed with admitting physician , CT reviewed, and Notes from prior ED visits         Patient here with shortness of breath, hypoxia.  Differential includes pneumonia, CHF, COPD exacerbation, PE.  Chest x-ray shows no pneumothorax but does show signs concerning for possible pulmonary edema as well as infiltrate.  Will obtain COVID and flu swabs, blood cultures, procalcitonin, lactic.  Will give duo nebs, Solu-Medrol.  Will obtain CTA of the chest.  EKG is  nonischemic.  Troponin x2 negative.  Patient will need admission given new oxygen requirement.  ED PROGRESS  CTA of the chest shows no PE, no edema.  BNP normal.  CT does show bilateral lower lobe opacities which could represent developing pneumonia.  He has a normal lactic but procalcitonin is slightly elevated.  We will give Rocephin and azithromycin.  Blood cultures pending.  Urine shows no sign of infection.  COVID and flu swabs negative.  Respiratory viral panel pending.  Reports he has had some improvement after breathing treatments.  Will discuss with hospitalist for admission.   5:09 AM Discussed patient's case with hospitalist, Dr. Sidney Ace.  I have recommended admission and patient (and family if present) agree with this plan. Admitting physician will  place admission orders.   I reviewed all nursing notes, vitals, pertinent previous records and reviewed/interpreted all EKGs, lab and urine results, imaging (as available).  ____________________________________________   FINAL CLINICAL IMPRESSION(S) / ED DIAGNOSES  Final diagnoses:  Acute respiratory failure with hypoxia ([place])  Community acquired bilateral lower lobe pneumonia  Chronic obstructive pulmonary disease, unspecified COPD type [facility])     ED Discharge Orders     None       *Please note:  Jarrold Kohlman was evaluated in Emergency Department on 08/26/2021 for the symptoms described in the history of present illness. He was evaluated in the context of the global COVID-19 pandemic, which necessitated consideration that the patient might be at risk for infection with the SARS-CoV-2 virus that causes COVID-19. Institutional protocols and algorithms that pertain to the evaluation of patients at risk for COVID-19 are in a state of rapid change based on information released by regulatory bodies including the CDC and federal and state organizations. These policies and algorithms were followed during the patient's care in the ED.   Some ED evaluations and interventions may be delayed as a result of limited staffing during and the pandemic.*   Note:  This document was prepared using Dragon voice recognition software and may include unintentional dictation errors.    Jens Siems, Delice Bison, DO 08/26/21 [phone]

## 2021-08-27 DIAGNOSIS — J189 Pneumonia, unspecified organism: Secondary | ICD-10-CM | POA: Diagnosis not present

## 2021-08-27 LAB — BASIC METABOLIC PANEL
Anion gap: 11 (ref 5–15)
BUN: 27 mg/dL — ABNORMAL HIGH (ref 6–20)
CO2: 32 mmol/L (ref 22–32)
Calcium: 10.4 mg/dL — ABNORMAL HIGH (ref 8.9–10.3)
Chloride: 95 mmol/L — ABNORMAL LOW (ref 98–111)
Creatinine, Ser: 1.63 mg/dL — ABNORMAL HIGH (ref 0.61–1.24)
GFR, Estimated: 52 mL/min — ABNORMAL LOW (ref >60)
Glucose, Bld: 182 mg/dL — ABNORMAL HIGH (ref 70–99)
Potassium: 4 mmol/L (ref 3.5–5.1)
Sodium: 138 mmol/L (ref 135–145)

## 2021-08-27 LAB — GLUCOSE, CAPILLARY
Glucose-Capillary: 182 mg/dL — ABNORMAL HIGH (ref 70–99)
Glucose-Capillary: 265 mg/dL — ABNORMAL HIGH (ref 70–99)
Glucose-Capillary: 241 mg/dL — ABNORMAL HIGH (ref 70–99)
Glucose-Capillary: 259 mg/dL — ABNORMAL HIGH (ref 70–99)

## 2021-08-27 LAB — LEGIONELLA PNEUMOPHILA SEROGP 1 UR AG: L. pneumophila Serogp 1 Ur Ag: NEGATIVE

## 2021-08-27 MED ORDER — AZITHROMYCIN 250 MG PO TABS
500.0000 mg | ORAL_TABLET | Freq: Every day | ORAL | Status: DC
  Administered 2021-08-28: 500 mg via ORAL
  Filled 2021-08-27: qty 2

## 2021-08-27 MED ORDER — SODIUM CHLORIDE 0.9 % IV SOLN: INTRAVENOUS | Status: DC

## 2021-08-27 NOTE — Progress Notes (Signed)
Inpatient Diabetes Program Recommendations  AACE/ADA: New Consensus Statement on Inpatient Glycemic Control (2015)  Target Ranges:  Prepandial:   less than 140 mg/dL      Peak postprandial:   less than 180 mg/dL (1-2 hours)      Critically ill patients:  140 - 180 mg/dL  Results for MALCOMB, GANGEMI (MRN [id number]) as of 08/27/2021 12:28  Ref. Range 05/10/2021 07:13 05/10/2021 11:56 08/26/2021 12:01 08/26/2021 16:24 08/26/2021 21:24  Glucose-Capillary Latest Ref Range: 70 - 99 mg/dL 497 (H) 530 (H)    051 (H)  15 units Novolog 277 (H)  11 units Novolog  285 (H)  3 units Novolog   Results for TYGER, WICHMAN (MRN [id number]) as of 08/27/2021 12:28  Ref. Range 08/27/2021 08:06 08/27/2021 11:41  Glucose-Capillary Latest Ref Range: 70 - 99 mg/dL 670 (H)  4 units Novolog  265 (H)    Home DM Meds: Jardiance 12.5 mg daily     Metformin 1000 mg BID   Current Orders: Novolog 0-20 units TID ac/hs  Jardiance 10 mg daily    MD- Note patient getting Prednisone 40 mg daily  Having elevated afternoon CBGs  Please consider starting Novolog Meal Coverage:  Novolog 4 units TID with meals  Hold if pt eats <50% of meal, Hold if pt NPO    --Will follow patient during hospitalization--  Ambrose Finland RN, MSN, CDE Diabetes Coordinator Inpatient Glycemic Control Team Team Pager: [phone] (8a-5p)

## 2021-08-27 NOTE — Progress Notes (Signed)
PHARMACIST - PHYSICIAN COMMUNICATION DR:   Renford Dills CONCERNING: Antibiotic IV to Oral Route Change Policy  RECOMMENDATION: This patient is receiving azithromycin by the intravenous route.  Based on criteria approved by the Pharmacy and Therapeutics Committee, the antibiotic(s) is/are being converted to the equivalent oral dose form(s).   DESCRIPTION: These criteria include: Patient being treated for a respiratory tract infection, urinary tract infection, cellulitis or clostridium difficile associated diarrhea if on metronidazole The patient is not neutropenic and does not exhibit a GI malabsorption state The patient is eating (either orally or via tube) and/or has been taking other orally administered medications for a least 24 hours The patient is improving clinically and has a Tmax < 100.5    Sharen Hones, PharmD, BCPS Clinical Pharmacist

## 2021-08-27 NOTE — Progress Notes (Signed)
Ambulated approximately 150 ft, patient on room air, O2 saturation down to 91-92%, no c/o SOB nor DOB, no distress.  O2 saturation on RA at rest 98%.  Needs addressed, Dr. Renford Dills made aware of pt's ambulatory saturation on RA.

## 2021-08-27 NOTE — Progress Notes (Signed)
PROGRESS NOTE    Charles Rangel  OFB:[id number] DOB: [birth year] DOA: 08/26/2021 PCP: [facility]   Chief Complain: Shortness of breath  Brief Narrative: Patient is a 45 year old male with history of heart failure with preserved ejection fraction, COPD, type 2 diabetes, hypertension, DVT, gout, OSA who presented with worsening dyspnea, cough with whitish sputum.  On presentation he was hypertensive, he was hypoxic on room air.  Does not use oxygen at home.  Chest x-ray showed vascular congestion, edema.  Chest CT showed bilateral streaky groundglass opacities suspicious for infiltrate.  Patient was admitted for the management of COPD exacerbation/possible superimposed pneumonia.  Assessment & Plan:   Active Problems:   CAP (community acquired pneumonia)   Acute respiratory failure with hypoxia/COPD exacerbation: Presented with cough, shortness of breath.  Not on oxygen at home.  Past smoker.  Has history of COPD. Started on IV steroids, bronchodilators, supplemental oxygen. Continue incentive spirometry.  He follows with pulmonology at [facility].  He is a past smoker.  Currently he is  maintaining saturation on room air  Possible superimposed community-acquired pneumonia:Chest CT showed bilateral streaky groundglass opacities suspicious for infiltrate.  Currently on ceftriaxone azithromycin.  Patient is afebrile.  No leukocytosis. Continue current medications  History of pulmonary hypertension: On Sildenafil  History of diastolic congestive heart failure: He looked volume overloaded on admission.  Fluid was stopped .  Started on torsemide.  Echo done on 04/2020 showed EF of 60 to 65%, indeterminate left ventricular diastolic parameters.  He had bilateral lower extremity edema. Gentle IV fluids restarted today because of worsening kidney function, he had severe diuresis yesterday.  History of hypertension: On lisinopril, metoprolol at home.  Lisinopril hold due to AKI  History of  gout: On colchicine, allopurinol  Diabetes type 2: On Jardiance and metformin at home.  Continue sliding scale insulin here.  Monitor blood sugars  AKI/elevated creatinine: His baseline creatinine ranged from 1.2-1.4.  Creatinine creeped up to 1.6 today.  Likely from overdiuresis.  Restarted gentle IV fluids.Check BMP tomorrow. Diuretics on hold  Morbid obesity: BMI of 49.5  History of OSA: On BiPAP at home.  Continue CPAP.             DVT prophylaxis:Lovenox Code Status: Full Family Communication: None at bedside Status is: Inpatient  Remains inpatient appropriate because: Requiring oxygen supplementation for acute hypoxic respiratory failure      Consultants: None  Procedures:None  Antimicrobials:  Anti-infectives (From admission, onward)    Start     Dose/Rate Route Frequency Ordered Stop   08/27/21 1000  azithromycin (ZITHROMAX) 500 mg in sodium chloride 0.9 % 250 mL IVPB        500 mg 250 mL/hr over 60 Minutes Intravenous Every 24 hours 08/26/21 0522 09/01/21 0959   08/27/21 0900  cefTRIAXone (ROCEPHIN) 2 g in sodium chloride 0.9 % 100 mL IVPB        2 g 200 mL/hr over 30 Minutes Intravenous Every 24 hours 08/26/21 0522 09/01/21 0859   08/26/21 0430  cefTRIAXone (ROCEPHIN) 1 g in sodium chloride 0.9 % 100 mL IVPB        1 g 200 mL/hr over 30 Minutes Intravenous  Once 08/26/21 0418 08/26/21 1048   08/26/21 0430  azithromycin (ZITHROMAX) 500 mg in sodium chloride 0.9 % 250 mL IVPB        500 mg 250 mL/hr over 60 Minutes Intravenous  Once 08/26/21 0418 08/26/21 0900       Subjective: Patient seen and  examined at the bedside this morning.  He feels much better.  Breathing is improved, no wheezing, cough is improved.  He was on room air.  He was eager to go home.  We discussed about staying 1 more night because of his worsening kidney function and persistent sinus tachycardia   Objective: Vitals:   08/26/21 1850 08/26/21 1900 08/26/21 1930 08/27/21 0527   BP:   116/66 108/69  Pulse:   (!) 105 92  Resp:   16 20  Temp:   98 F (36.7 C) 97.7 F (36.5 C)  TempSrc:   Oral Oral  SpO2: (!) 85% 94% 94% 98%  Weight:      Height:        Intake/Output Summary (Last 24 hours) at 08/27/2021 0746 Last data filed at 08/27/2021 6568 Gross per 24 hour  Intake 480 ml  Output 5050 ml  Net -4570 ml   Filed Weights   08/25/21 1809 08/26/21 0651  Weight: (!) 140.6 kg (!) 143.6 kg    Examination:  General exam: Overall comfortable, not in distress, morbidly obese HEENT: PERRL Respiratory system: Diminished air sounds bilaterally, no wheezes or crackles  Cardiovascular system: S1 & S2 heard, RRR.  Gastrointestinal system: Abdomen is nondistended, soft and nontender. Central nervous system: Alert and oriented Extremities: trace  bilateral pitting pedal edema, no clubbing ,no cyanosis Skin: No rashes, no ulcers,no icterus      Data Reviewed: I have personally reviewed following labs and imaging studies  CBC: Recent Labs  Lab 08/25/21 1818 08/26/21 0616  WBC 8.5 8.4  HGB 13.7 14.3  HCT 42.4 44.0  MCV 78.8* 79.1*  PLT 251 261   Basic Metabolic Panel: Recent Labs  Lab 08/25/21 1818 08/26/21 0616 08/27/21 0650  NA 137  --  138  K 3.5  --  4.0  CL 96*  --  95*  CO2 30  --  32  GLUCOSE 138*  --  182*  BUN 15  --  27*  CREATININE 1.26* 1.29* 1.63*  CALCIUM 9.5  --  10.4*   GFR: Estimated Creatinine Clearance: 77.8 mL/min (A) (by C-G formula based on SCr of 1.63 mg/dL (H)). Liver Function Tests: No results for input(s): AST, ALT, ALKPHOS, BILITOT, PROT, ALBUMIN in the last 168 hours. No results for input(s): LIPASE, AMYLASE in the last 168 hours. No results for input(s): AMMONIA in the last 168 hours. Coagulation Profile: No results for input(s): INR, PROTIME in the last 168 hours. Cardiac Enzymes: No results for input(s): CKTOTAL, CKMB, CKMBINDEX, TROPONINI in the last 168 hours. BNP (last 3 results) No results for input(s):  PROBNP in the last 8760 hours. HbA1C: No results for input(s): HGBA1C in the last 72 hours. CBG: Recent Labs  Lab 08/26/21 1201 08/26/21 1624 08/26/21 2124  GLUCAP 314* 277* 285*   Lipid Profile: No results for input(s): CHOL, HDL, LDLCALC, TRIG, CHOLHDL, LDLDIRECT in the last 72 hours. Thyroid Function Tests: No results for input(s): TSH, T4TOTAL, FREET4, T3FREE, THYROIDAB in the last 72 hours. Anemia Panel: No results for input(s): VITAMINB12, FOLATE, FERRITIN, TIBC, IRON, RETICCTPCT in the last 72 hours. Sepsis Labs: Recent Labs  Lab 08/26/21 0048 08/26/21 0058  PROCALCITON 0.14  --   LATICACIDVEN  --  1.4    Recent Results (from the past 240 hour(s))  Respiratory (~20 pathogens) panel by PCR     Status: None   Collection Time: 08/26/21 12:44 AM   Specimen: Nasopharyngeal Swab; Respiratory  Result Value Ref Range Status  Adenovirus NOT DETECTED NOT DETECTED Final   Coronavirus 229E NOT DETECTED NOT DETECTED Final    Comment: (NOTE) The Coronavirus on the Respiratory Panel, DOES NOT test for the novel  Coronavirus (2019 nCoV)    Coronavirus HKU1 NOT DETECTED NOT DETECTED Final   Coronavirus NL63 NOT DETECTED NOT DETECTED Final   Coronavirus OC43 NOT DETECTED NOT DETECTED Final   Metapneumovirus NOT DETECTED NOT DETECTED Final   Rhinovirus / Enterovirus NOT DETECTED NOT DETECTED Final   Influenza A NOT DETECTED NOT DETECTED Final   Influenza B NOT DETECTED NOT DETECTED Final   Parainfluenza Virus 1 NOT DETECTED NOT DETECTED Final   Parainfluenza Virus 2 NOT DETECTED NOT DETECTED Final   Parainfluenza Virus 3 NOT DETECTED NOT DETECTED Final   Parainfluenza Virus 4 NOT DETECTED NOT DETECTED Final   Respiratory Syncytial Virus NOT DETECTED NOT DETECTED Final   Bordetella pertussis NOT DETECTED NOT DETECTED Final   Bordetella Parapertussis NOT DETECTED NOT DETECTED Final   Chlamydophila pneumoniae NOT DETECTED NOT DETECTED Final   Mycoplasma pneumoniae NOT DETECTED  NOT DETECTED Final    Comment: Performed at [facility], [address] [place], [place] [address]  Culture, blood (Routine X 2) w Reflex to ID Panel     Status: None (Preliminary result)   Collection Time: 08/26/21 12:57 AM   Specimen: BLOOD  Result Value Ref Range Status   Specimen Description BLOOD LEFT AC  Final   Special Requests   Final    BOTTLES DRAWN AEROBIC AND ANAEROBIC Blood Culture adequate volume   Culture   Final    NO GROWTH < 12 HOURS Performed at [facility], [address], [place], [place] [address]    Report Status PENDING  Incomplete  Culture, blood (Routine X 2) w Reflex to ID Panel     Status: None (Preliminary result)   Collection Time: 08/26/21 12:58 AM   Specimen: BLOOD  Result Value Ref Range Status   Specimen Description BLOOD LEFT HAND  Final   Special Requests   Final    BOTTLES DRAWN AEROBIC AND ANAEROBIC Blood Culture adequate volume   Culture   Final    NO GROWTH < 12 HOURS Performed at [facility], [address] [place], [place] [address]    Report Status PENDING  Incomplete  Resp Panel by RT-PCR (Flu A&B, Covid) Nasopharyngeal Swab     Status: None   Collection Time: 08/26/21  1:07 AM   Specimen: Nasopharyngeal Swab; Nasopharyngeal(NP) swabs in vial transport medium  Result Value Ref Range Status   SARS Coronavirus 2 by RT PCR NEGATIVE NEGATIVE Final    Comment: (NOTE) SARS-CoV-2 target nucleic acids are NOT DETECTED.  The SARS-CoV-2 RNA is generally detectable in upper respiratory specimens during the acute phase of infection. The lowest concentration of SARS-CoV-2 viral copies this assay can detect is 138 copies/mL. A negative result does not preclude SARS-Cov-2 infection and should not be used as the sole basis for treatment or other patient management decisions. A negative result may occur with  improper specimen collection/handling, submission of specimen other than nasopharyngeal swab, presence of viral  mutation(s) within the areas targeted by this assay, and inadequate number of viral copies(<138 copies/mL). A negative result must be combined with clinical observations, patient history, and epidemiological information. The expected result is Negative.  Fact Sheet for Patients:  BloggerCourse.com  Fact Sheet for Healthcare Providers:  SeriousBroker.it  This test is no t yet approved or cleared by  the [employer] and  has been authorized for detection and/or diagnosis of SARS-CoV-2 by FDA under an Emergency Use Authorization (EUA). This EUA will remain  in effect (meaning this test can be used) for the duration of the COVID-19 declaration under Section 564(b)(1) of the Act, 21 U.S.C.section 360bbb-3(b)(1), unless the authorization is terminated  or revoked sooner.       Influenza A by PCR NEGATIVE NEGATIVE Final   Influenza B by PCR NEGATIVE NEGATIVE Final    Comment: (NOTE) The Xpert Xpress SARS-CoV-2/FLU/RSV plus assay is intended as an aid in the diagnosis of influenza from Nasopharyngeal swab specimens and should not be used as a sole basis for treatment. Nasal washings and aspirates are unacceptable for Xpert Xpress SARS-CoV-2/FLU/RSV testing.  Fact Sheet for Patients: BloggerCourse.com  Fact Sheet for Healthcare Providers: SeriousBroker.it  This test is not yet approved or cleared by the [place] FDA and has been authorized for detection and/or diagnosis of SARS-CoV-2 by FDA under an Emergency Use Authorization (EUA). This EUA will remain in effect (meaning this test can be used) for the duration of the COVID-19 declaration under Section 564(b)(1) of the Act, 21 U.S.C. section 360bbb-3(b)(1), unless the authorization is terminated or revoked.  Performed at [facility], [address], [place], [place] [address]          Radiology  Studies: DG Chest 2 View  Result Date: 08/25/2021 CLINICAL DATA:  Shortness of breath. EXAM: CHEST - 2 VIEW COMPARISON:  Chest radiograph dated 05/07/2021. FINDINGS: Cardiomegaly with vascular congestion and edema bilateral confluent and nodular opacities as seen on the prior radiograph may represent edema or pneumonia. No large pleural effusion. No pneumothorax. No acute osseous pathology. IMPRESSION: Enlarged cardiac silhouette with vascular congestion and edema. Pneumonia is not excluded. Overall interval progression of interstitial reticulonodular densities compared to prior radiograph. Electronically Signed   By: Elgie Collard M.D.   On: 08/25/2021 19:11   CT Angio Chest PE W and/or Wo Contrast  Result Date: 08/26/2021 CLINICAL DATA:  Concern for pulmonary embolism. EXAM: CT ANGIOGRAPHY CHEST WITH CONTRAST TECHNIQUE: Multidetector CT imaging of the chest was performed using the standard protocol during bolus administration of intravenous contrast. Multiplanar CT image reconstructions and MIPs were obtained to evaluate the vascular anatomy. CONTRAST:  59mL OMNIPAQUE IOHEXOL 350 MG/ML SOLN COMPARISON:  Radiograph dated 08/25/2021 and chest CT dated 12/22/2019. FINDINGS: Evaluation of this exam is limited due to respiratory motion artifact. Cardiovascular: There is no cardiomegaly or pericardial effusion. The thoracic aorta is unremarkable. The origins of the great vessels of the aortic arch appear patent as visualized. Evaluation of the pulmonary arteries is limited due to respiratory motion artifact and suboptimal opacification and timing of the contrast. No pulmonary artery embolus identified. Mediastinum/Nodes: Top-normal hilar and mediastinal lymph nodes. Similar appearance of nodular density to the left of the trachea in the aortopulmonic window, likely a cluster of lymph nodes. The esophagus is grossly unremarkable. No mediastinal fluid collection. Lungs/Pleura: Bilateral streaky and ground-glass  densities with mosaic attenuation of the lungs. Interval progression of streaky densities since the prior CT which may represent scarring or developing infiltrate. No lobar consolidation. There is no pleural effusion pneumothorax. The central airways are patent. Upper Abdomen: Fatty liver.  Multiple gallstones. Musculoskeletal: Degenerative changes of the spine. No acute osseous pathology. Review of the MIP images confirms the above findings. IMPRESSION: 1. No CT evidence of pulmonary embolism. 2. Bilateral streaky and ground-glass densities with mosaic attenuation of the lungs. Interval  progression of streaky densities since the prior CT which may represent scarring or developing infiltrate. 3. Fatty liver. 4. Cholelithiasis. Electronically Signed   By: Elgie Collard M.D.   On: 08/26/2021 03:34        Scheduled Meds:  allopurinol  100 mg Oral Daily   aspirin  81 mg Oral Daily   chlorpheniramine-HYDROcodone  5 mL Oral Q12H   cholecalciferol  1,000 Units Oral Daily   empagliflozin  10 mg Oral Daily   enoxaparin (LOVENOX) injection  0.5 mg/kg Subcutaneous Q24H   guaiFENesin  1,200 mg Oral BID   insulin aspart  0-20 Units Subcutaneous TID WC   insulin aspart  0-5 Units Subcutaneous QHS   iron polysaccharides  150 mg Oral Daily   lisinopril  5 mg Oral Daily   metoprolol succinate  100 mg Oral Daily   mometasone-formoterol  2 puff Inhalation BID   multivitamin with minerals  1 tablet Oral Daily   pantoprazole  40 mg Oral Daily   predniSONE  40 mg Oral Q breakfast   sildenafil  20 mg Oral TID   spironolactone  25 mg Oral Daily   torsemide  40 mg Oral BID   Continuous Infusions:  azithromycin     cefTRIAXone (ROCEPHIN)  IV       LOS: 1 day    Time spent: More than 50% of that time was spent in counseling and/or coordination of care.      Burnadette Pop, MD [facility] P11/05/2021, 7:46 AM

## 2021-08-28 DIAGNOSIS — J189 Pneumonia, unspecified organism: Secondary | ICD-10-CM | POA: Diagnosis not present

## 2021-08-28 LAB — GLUCOSE, CAPILLARY
Glucose-Capillary: 141 mg/dL — ABNORMAL HIGH (ref 70–99)
Glucose-Capillary: 168 mg/dL — ABNORMAL HIGH (ref 70–99)

## 2021-08-28 LAB — BASIC METABOLIC PANEL
Anion gap: 9 (ref 5–15)
BUN: 37 mg/dL — ABNORMAL HIGH (ref 6–20)
CO2: 33 mmol/L — ABNORMAL HIGH (ref 22–32)
Calcium: 9.8 mg/dL (ref 8.9–10.3)
Chloride: 98 mmol/L (ref 98–111)
Creatinine, Ser: 1.45 mg/dL — ABNORMAL HIGH (ref 0.61–1.24)
GFR, Estimated: >60 mL/min (ref >60)
Glucose, Bld: 143 mg/dL — ABNORMAL HIGH (ref 70–99)
Potassium: 4.2 mmol/L (ref 3.5–5.1)
Sodium: 140 mmol/L (ref 135–145)

## 2021-08-28 MED ORDER — PREDNISONE 20 MG PO TABS
40.0000 mg | ORAL_TABLET | Freq: Every day | ORAL | 0 refills | Status: AC
Start: 2021-08-29 — End: 2021-08-31

## 2021-08-28 MED ORDER — AZITHROMYCIN 250 MG PO TABS
250.0000 mg | ORAL_TABLET | Freq: Every day | ORAL | 0 refills | Status: AC
Start: 2021-08-29 — End: 2021-08-31

## 2021-08-28 MED ORDER — GUAIFENESIN ER 600 MG PO TB12: 1200.0000 mg | ORAL_TABLET | Freq: Two times a day (BID) | ORAL | 0 refills | Status: AC | PRN

## 2021-08-28 NOTE — Progress Notes (Signed)
Discharge instructions given to patient. Emphasized education on taking medications as prescribed and eating healthy diet.  Encouraged to call the doctor for questions.  Patient verbalized understanding.  Needs addressed. Discharged home.

## 2021-08-28 NOTE — Discharge Summary (Signed)
Physician Discharge Summary  Charles Rangel OHY:[id number] DOB: December 06, 1975 DOA: 08/26/2021  PCP: [facility]  Admit date: 08/26/2021 Discharge date: 08/28/2021  Admitted From: Home Disposition:  Home  Discharge Condition:Stable CODE STATUS:FULL Diet recommendation: Heart Healthy   Brief/Interim Summary:  Patient is a 45 year old male with history of heart failure with preserved ejection fraction, COPD, type 2 diabetes, hypertension, DVT, gout, OSA who presented with worsening dyspnea, cough with whitish sputum.  On presentation he was hypertensive, he was hypoxic on room air.  Does not use oxygen at home.  Chest x-ray showed vascular congestion, edema.  Chest CT showed bilateral streaky groundglass opacities suspicious for infiltrate.  Patient was admitted for the management of COPD exacerbation/possible superimposed pneumonia.  Overall status has significantly improved.  Currently he is on room air maintaining his saturation.  He did not qualify for home oxygen.  Patient is medically stable for discharge today.  Following problems were addressed during his hospitalization:  Acute respiratory failure with hypoxia/COPD exacerbation: Presented with cough, shortness of breath.  Not on oxygen at home.  Past smoker.  Has history of COPD. Started on IV steroids, bronchodilators, supplemental oxygen. Continue incentive spirometry.  He follows with pulmonology at [facility].  He is a past smoker.  Currently he is  maintaining saturation on room air. He will be discharged on few days of prednisone.   Possible superimposed community-acquired pneumonia:Chest CT showed bilateral streaky groundglass opacities suspicious for infiltrate.  Started on ceftriaxone azithromycin.  Patient is afebrile.  No leukocytosis. Continue current medications, antibiotics changed to oral.   History of pulmonary hypertension: On Sildenafil   History of diastolic congestive heart failure: He looked volume overloaded  on admission.  Fluid was stopped .  Started on torsemide.  Echo done on 04/2020 showed EF of 60 to 65%, indeterminate left ventricular diastolic parameters.  He had bilateral lower extremity edema. Had to be started on IV because of worsening kidney function, with improvement in the kidney function.  Diuretics can be resumed on discharge.  Check BMP in a week   History of hypertension: On lisinopril, metoprolol at home.    History of gout: On colchicine, allopurinol   Diabetes type 2: On Jardiance and metformin at home.    AKI/elevated creatinine: His baseline creatinine ranged from 1.2-1.4.  Creatinine creeped up to 1.6 .  Likely from overdiuresis.  Restarted gentle IV fluids, with improvement.  Check BMP in a week  Morbid obesity: BMI of 49.5   History of OSA: On BiPAP at home.    Discharge Diagnoses:  Active Problems:   CAP (community acquired pneumonia)    Discharge Instructions  Discharge Instructions     Diet - low sodium heart healthy   Complete by: As directed    Discharge instructions   Complete by: As directed    1)Please take prescribed medications as instructed 2)Follow up with your PCP in a week.Do a BMP test to check your kidney function during the follow-up.   Increase activity slowly   Complete by: As directed       Allergies as of 08/28/2021       Reactions   Tylenol [acetaminophen] Anaphylaxis   Dog Epithelium Allergy Skin Test Hives   Apple Hives   Other Hives   RAISINS        Medication List     TAKE these medications    albuterol 108 (90 Base) MCG/ACT inhaler Commonly known as: VENTOLIN HFA Inhale 2 puffs into the lungs every 6 (  six) hours as needed for wheezing or shortness of breath.   allopurinol 100 MG tablet Commonly known as: ZYLOPRIM Take 100 mg by mouth daily.   aspirin 81 MG chewable tablet Chew 81 mg by mouth daily.   azithromycin 250 MG tablet Commonly known as: ZITHROMAX Take 1 tablet (250 mg total) by mouth daily for 2  days. Start taking on: August 29, 2021   cholecalciferol 25 MCG (1000 UNIT) tablet Commonly known as: VITAMIN D3 Take 1,000 Units by mouth daily.   colchicine 0.6 MG tablet Take 0.6 mg by mouth as needed.   empagliflozin 25 MG Tabs tablet Commonly known as: JARDIANCE Take 12.5 mg by mouth daily.   fluticasone 50 MCG/ACT nasal spray Commonly known as: FLONASE Place 1-2 sprays into the nose 2 (two) times daily as needed for allergies.   fluticasone-salmeterol 100-50 MCG/ACT Aepb Commonly known as: ADVAIR Inhale 1 puff into the lungs 2 (two) times daily.   guaiFENesin 600 MG 12 hr tablet Commonly known as: MUCINEX Take 2 tablets (1,200 mg total) by mouth 2 (two) times daily as needed for up to 7 days.   iron polysaccharides 150 MG capsule Commonly known as: NIFEREX Take 1 capsule (150 mg total) by mouth daily.   lisinopril 5 MG tablet Commonly known as: ZESTRIL Take 5 mg by mouth daily.   metFORMIN 1000 MG tablet Commonly known as: GLUCOPHAGE Take 1,000 mg by mouth 2 (two) times daily with a meal.   metoprolol succinate 100 MG 24 hr tablet Commonly known as: TOPROL-XL Take 100 mg by mouth daily.   multivitamin with minerals tablet Take 1 tablet by mouth daily.   omeprazole 20 MG capsule Commonly known as: PRILOSEC Take 20 mg by mouth daily before breakfast.   predniSONE 20 MG tablet Commonly known as: DELTASONE Take 2 tablets (40 mg total) by mouth daily with breakfast for 2 days. Start taking on: August 29, 2021   sildenafil 20 MG tablet Commonly known as: REVATIO Take 20 mg by mouth 3 (three) times daily.   spironolactone 25 MG tablet Commonly known as: ALDACTONE Take 1 tablet by mouth daily.   torsemide 20 MG tablet Commonly known as: DEMADEX Take 40 mg by mouth 2 (two) times daily.        Follow-up Information     [facility]. Schedule an appointment as soon as possible for a visit in 1 week(s).   Specialty: General  Practice Contact information: [address] [place] [address] [phone]                Allergies  Allergen Reactions   Tylenol [Acetaminophen] Anaphylaxis   Dog Epithelium Allergy Skin Test Hives   Apple Hives   Other Hives    RAISINS    Consultations: None   Procedures/Studies: DG Chest 2 View  Result Date: 08/25/2021 CLINICAL DATA:  Shortness of breath. EXAM: CHEST - 2 VIEW COMPARISON:  Chest radiograph dated 05/07/2021. FINDINGS: Cardiomegaly with vascular congestion and edema bilateral confluent and nodular opacities as seen on the prior radiograph may represent edema or pneumonia. No large pleural effusion. No pneumothorax. No acute osseous pathology. IMPRESSION: Enlarged cardiac silhouette with vascular congestion and edema. Pneumonia is not excluded. Overall interval progression of interstitial reticulonodular densities compared to prior radiograph. Electronically Signed   By: Elgie Collard M.D.   On: 08/25/2021 19:11   CT Angio Chest PE W and/or Wo Contrast  Result Date: 08/26/2021 CLINICAL DATA:  Concern for pulmonary embolism. EXAM: CT  ANGIOGRAPHY CHEST WITH CONTRAST TECHNIQUE: Multidetector CT imaging of the chest was performed using the standard protocol during bolus administration of intravenous contrast. Multiplanar CT image reconstructions and MIPs were obtained to evaluate the vascular anatomy. CONTRAST:  75mL OMNIPAQUE IOHEXOL 350 MG/ML SOLN COMPARISON:  Radiograph dated 08/25/2021 and chest CT dated 12/22/2019. FINDINGS: Evaluation of this exam is limited due to respiratory motion artifact. Cardiovascular: There is no cardiomegaly or pericardial effusion. The thoracic aorta is unremarkable. The origins of the great vessels of the aortic arch appear patent as visualized. Evaluation of the pulmonary arteries is limited due to respiratory motion artifact and suboptimal opacification and timing of the contrast. No pulmonary artery embolus identified.  Mediastinum/Nodes: Top-normal hilar and mediastinal lymph nodes. Similar appearance of nodular density to the left of the trachea in the aortopulmonic window, likely a cluster of lymph nodes. The esophagus is grossly unremarkable. No mediastinal fluid collection. Lungs/Pleura: Bilateral streaky and ground-glass densities with mosaic attenuation of the lungs. Interval progression of streaky densities since the prior CT which may represent scarring or developing infiltrate. No lobar consolidation. There is no pleural effusion pneumothorax. The central airways are patent. Upper Abdomen: Fatty liver.  Multiple gallstones. Musculoskeletal: Degenerative changes of the spine. No acute osseous pathology. Review of the MIP images confirms the above findings. IMPRESSION: 1. No CT evidence of pulmonary embolism. 2. Bilateral streaky and ground-glass densities with mosaic attenuation of the lungs. Interval progression of streaky densities since the prior CT which may represent scarring or developing infiltrate. 3. Fatty liver. 4. Cholelithiasis. Electronically Signed   By: Elgie Collard M.D.   On: 08/26/2021 03:34      Subjective: Patient seen and examined the bedside this morning.  Medically stable for discharge today.  Discharge Exam: Vitals:   08/28/21 0406 08/28/21 0749  BP: 124/70 117/84  Pulse: 89 93  Resp: 18 18  Temp: (!) 97.1 F (36.2 C) 97.6 F (36.4 C)  SpO2: 96% 95%   Vitals:   08/27/21 1537 08/27/21 1955 08/28/21 0406 08/28/21 0749  BP: 121/77 111/75 124/70 117/84  Pulse: 89 95 89 93  Resp: Temp: 97.6 F (36.4 C) 97.6 F (36.4 C) (!) 97.1 F (36.2 C) 97.6 F (36.4 C)  TempSrc: Oral Oral  Oral  SpO2: 97% 96% 96% 95%  Weight:      Height:        General: Pt is alert, awake, not in acute distress Cardiovascular: RRR, S1/S2 +, no rubs, no gallops Respiratory: CTA bilaterally, no wheezing, no rhonchi Abdominal: Soft, NT, ND, bowel sounds + Extremities: no edema, no  cyanosis    The results of significant diagnostics from this hospitalization (including imaging, microbiology, ancillary and laboratory) are listed below for reference.     Microbiology: Recent Results (from the past 240 hour(s))  Respiratory (~20 pathogens) panel by PCR     Status: None   Collection Time: 08/26/21 12:44 AM   Specimen: Nasopharyngeal Swab; Respiratory  Result Value Ref Range Status   Adenovirus NOT DETECTED NOT DETECTED Final   Coronavirus 229E NOT DETECTED NOT DETECTED Final    Comment: (NOTE) The Coronavirus on the Respiratory Panel, DOES NOT test for the novel  Coronavirus (2019 nCoV)    Coronavirus HKU1 NOT DETECTED NOT DETECTED Final   Coronavirus NL63 NOT DETECTED NOT DETECTED Final   Coronavirus OC43 NOT DETECTED NOT DETECTED Final   Metapneumovirus NOT DETECTED NOT DETECTED Final   Rhinovirus / Enterovirus NOT DETECTED NOT DETECTED Final  Influenza A NOT DETECTED NOT DETECTED Final   Influenza B NOT DETECTED NOT DETECTED Final   Parainfluenza Virus 1 NOT DETECTED NOT DETECTED Final   Parainfluenza Virus 2 NOT DETECTED NOT DETECTED Final   Parainfluenza Virus 3 NOT DETECTED NOT DETECTED Final   Parainfluenza Virus 4 NOT DETECTED NOT DETECTED Final   Respiratory Syncytial Virus NOT DETECTED NOT DETECTED Final   Bordetella pertussis NOT DETECTED NOT DETECTED Final   Bordetella Parapertussis NOT DETECTED NOT DETECTED Final   Chlamydophila pneumoniae NOT DETECTED NOT DETECTED Final   Mycoplasma pneumoniae NOT DETECTED NOT DETECTED Final    Comment: Performed at [facility], [address], [place], [place] [address]  Culture, blood (Routine X 2) w Reflex to ID Panel     Status: None (Preliminary result)   Collection Time: 08/26/21 12:57 AM   Specimen: BLOOD  Result Value Ref Range Status   Specimen Description BLOOD LEFT [facility]  Final   Special Requests   Final    BOTTLES DRAWN AEROBIC AND ANAEROBIC Blood Culture adequate volume   Culture   Final     NO GROWTH 2 DAYS Performed at [facility], [address] [place], [place] [address]    Report Status PENDING  Incomplete  Culture, blood (Routine X 2) w Reflex to ID Panel     Status: None (Preliminary result)   Collection Time: 08/26/21 12:58 AM   Specimen: BLOOD  Result Value Ref Range Status   Specimen Description BLOOD LEFT HAND  Final   Special Requests   Final    BOTTLES DRAWN AEROBIC AND ANAEROBIC Blood Culture adequate volume   Culture   Final    NO GROWTH 2 DAYS Performed at [facility], [address] [place], [place] [address]    Report Status PENDING  Incomplete  Resp Panel by RT-PCR (Flu A&B, Covid) Nasopharyngeal Swab     Status: None   Collection Time: 08/26/21  1:07 AM   Specimen: Nasopharyngeal Swab; Nasopharyngeal(NP) swabs in vial transport medium  Result Value Ref Range Status   SARS Coronavirus 2 by RT PCR NEGATIVE NEGATIVE Final    Comment: (NOTE) SARS-CoV-2 target nucleic acids are NOT DETECTED.  The SARS-CoV-2 RNA is generally detectable in upper respiratory specimens during the acute phase of infection. The lowest concentration of SARS-CoV-2 viral copies this assay can detect is 138 copies/mL. A negative result does not preclude SARS-Cov-2 infection and should not be used as the sole basis for treatment or other patient management decisions. A negative result may occur with  improper specimen collection/handling, submission of specimen other than nasopharyngeal swab, presence of viral mutation(s) within the areas targeted by this assay, and inadequate number of viral copies(<138 copies/mL). A negative result must be combined with clinical observations, patient history, and epidemiological information. The expected result is Negative.  Fact Sheet for Patients:  BloggerCourse.com  Fact Sheet for Healthcare Providers:  SeriousBroker.it  This test is no t yet approved or cleared  by the [place] FDA and  has been authorized for detection and/or diagnosis of SARS-CoV-2 by FDA under an Emergency Use Authorization (EUA). This EUA will remain  in effect (meaning this test can be used) for the duration of the COVID-19 declaration under Section 564(b)(1) of the Act, 21 U.S.C.section 360bbb-3(b)(1), unless the authorization is terminated  or revoked sooner.       Influenza A by PCR NEGATIVE NEGATIVE Final   Influenza B by PCR NEGATIVE NEGATIVE Final  Comment: (NOTE) The Xpert Xpress SARS-CoV-2/FLU/RSV plus assay is intended as an aid in the diagnosis of influenza from Nasopharyngeal swab specimens and should not be used as a sole basis for treatment. Nasal washings and aspirates are unacceptable for Xpert Xpress SARS-CoV-2/FLU/RSV testing.  Fact Sheet for Patients: BloggerCourse.com  Fact Sheet for Healthcare Providers: SeriousBroker.it  This test is not yet approved or cleared by the [place] FDA and has been authorized for detection and/or diagnosis of SARS-CoV-2 by FDA under an Emergency Use Authorization (EUA). This EUA will remain in effect (meaning this test can be used) for the duration of the COVID-19 declaration under Section 564(b)(1) of the Act, 21 U.S.C. section 360bbb-3(b)(1), unless the authorization is terminated or revoked.  Performed at [facility], [address] [place], [place] [address]      Labs: BNP (last 3 results) Recent Labs    05/07/21 1341 08/25/21 1818  BNP 30.3 14.9   Basic Metabolic Panel: Recent Labs  Lab 08/25/21 1818 08/26/21 0616 08/27/21 0650 08/28/21 0443  NA 137  --  138 140  K 3.5  --  4.0 4.2  CL 96*  --  95* 98  CO2 30  --  32 33*  GLUCOSE 138*  --  182* 143*  BUN 15  --  27* 37*  CREATININE 1.26* 1.29* 1.63* 1.45*  CALCIUM 9.5  --  10.4* 9.8   Liver Function Tests: No results for input(s): AST, ALT, ALKPHOS, BILITOT, PROT,  ALBUMIN in the last 168 hours. No results for input(s): LIPASE, AMYLASE in the last 168 hours. No results for input(s): AMMONIA in the last 168 hours. CBC: Recent Labs  Lab 08/25/21 1818 08/26/21 0616  WBC 8.5 8.4  HGB 13.7 14.3  HCT 42.4 44.0  MCV 78.8* 79.1*  PLT 251 261   Cardiac Enzymes: No results for input(s): CKTOTAL, CKMB, CKMBINDEX, TROPONINI in the last 168 hours. BNP: Invalid input(s): POCBNP CBG: Recent Labs  Lab 08/27/21 0806 08/27/21 1141 08/27/21 1649 08/27/21 2117 08/28/21 0751  GLUCAP 182* 265* 241* 259* 141*   D-Dimer No results for input(s): DDIMER in the last 72 hours. Hgb A1c No results for input(s): HGBA1C in the last 72 hours. Lipid Profile No results for input(s): CHOL, HDL, LDLCALC, TRIG, CHOLHDL, LDLDIRECT in the last 72 hours. Thyroid function studies No results for input(s): TSH, T4TOTAL, T3FREE, THYROIDAB in the last 72 hours.  Invalid input(s): FREET3 Anemia work up No results for input(s): VITAMINB12, FOLATE, FERRITIN, TIBC, IRON, RETICCTPCT in the last 72 hours. Urinalysis    Component Value Date/Time   COLORURINE YELLOW (A) 08/26/2021 0115   APPEARANCEUR CLEAR (A) 08/26/2021 0115   APPEARANCEUR Clear 07/28/2014 2317   LABSPEC 1.012 08/26/2021 0115   LABSPEC 1.024 07/28/2014 2317   PHURINE 5.0 08/26/2021 0115   GLUCOSEU NEGATIVE 08/26/2021 0115   GLUCOSEU Negative 07/28/2014 2317   HGBUR NEGATIVE 08/26/2021 0115   BILIRUBINUR NEGATIVE 08/26/2021 0115   BILIRUBINUR Negative 07/28/2014 2317   KETONESUR NEGATIVE 08/26/2021 0115   PROTEINUR 100 (A) 08/26/2021 0115   NITRITE NEGATIVE 08/26/2021 0115   LEUKOCYTESUR NEGATIVE 08/26/2021 0115   LEUKOCYTESUR Negative 07/28/2014 2317   Sepsis Labs Invalid input(s): PROCALCITONIN,  WBC,  LACTICIDVEN Microbiology Recent Results (from the past 240 hour(s))  Respiratory (~20 pathogens) panel by PCR     Status: None   Collection Time: 08/26/21 12:44 AM   Specimen: Nasopharyngeal Swab;  Respiratory  Result Value Ref Range Status   Adenovirus NOT DETECTED NOT DETECTED Final  Coronavirus 229E NOT DETECTED NOT DETECTED Final    Comment: (NOTE) The Coronavirus on the Respiratory Panel, DOES NOT test for the novel  Coronavirus (2019 nCoV)    Coronavirus HKU1 NOT DETECTED NOT DETECTED Final   Coronavirus NL63 NOT DETECTED NOT DETECTED Final   Coronavirus OC43 NOT DETECTED NOT DETECTED Final   Metapneumovirus NOT DETECTED NOT DETECTED Final   Rhinovirus / Enterovirus NOT DETECTED NOT DETECTED Final   Influenza A NOT DETECTED NOT DETECTED Final   Influenza B NOT DETECTED NOT DETECTED Final   Parainfluenza Virus 1 NOT DETECTED NOT DETECTED Final   Parainfluenza Virus 2 NOT DETECTED NOT DETECTED Final   Parainfluenza Virus 3 NOT DETECTED NOT DETECTED Final   Parainfluenza Virus 4 NOT DETECTED NOT DETECTED Final   Respiratory Syncytial Virus NOT DETECTED NOT DETECTED Final   Bordetella pertussis NOT DETECTED NOT DETECTED Final   Bordetella Parapertussis NOT DETECTED NOT DETECTED Final   Chlamydophila pneumoniae NOT DETECTED NOT DETECTED Final   Mycoplasma pneumoniae NOT DETECTED NOT DETECTED Final    Comment: Performed at [facility], [address] [place], [place] [address]  Culture, blood (Routine X 2) w Reflex to ID Panel     Status: None (Preliminary result)   Collection Time: 08/26/21 12:57 AM   Specimen: BLOOD  Result Value Ref Range Status   Specimen Description BLOOD LEFT [facility]  Final   Special Requests   Final    BOTTLES DRAWN AEROBIC AND ANAEROBIC Blood Culture adequate volume   Culture   Final    NO GROWTH 2 DAYS Performed at [facility], [address], [place], [place] [address]    Report Status PENDING  Incomplete  Culture, blood (Routine X 2) w Reflex to ID Panel     Status: None (Preliminary result)   Collection Time: 08/26/21 12:58 AM   Specimen: BLOOD  Result Value Ref Range Status   Specimen Description BLOOD LEFT HAND  Final    Special Requests   Final    BOTTLES DRAWN AEROBIC AND ANAEROBIC Blood Culture adequate volume   Culture   Final    NO GROWTH 2 DAYS Performed at [facility], [address] [place], [place] [address]    Report Status PENDING  Incomplete  Resp Panel by RT-PCR (Flu A&B, Covid) Nasopharyngeal Swab     Status: None   Collection Time: 08/26/21  1:07 AM   Specimen: Nasopharyngeal Swab; Nasopharyngeal(NP) swabs in vial transport medium  Result Value Ref Range Status   SARS Coronavirus 2 by RT PCR NEGATIVE NEGATIVE Final    Comment: (NOTE) SARS-CoV-2 target nucleic acids are NOT DETECTED.  The SARS-CoV-2 RNA is generally detectable in upper respiratory specimens during the acute phase of infection. The lowest concentration of SARS-CoV-2 viral copies this assay can detect is 138 copies/mL. A negative result does not preclude SARS-Cov-2 infection and should not be used as the sole basis for treatment or other patient management decisions. A negative result may occur with  improper specimen collection/handling, submission of specimen other than nasopharyngeal swab, presence of viral mutation(s) within the areas targeted by this assay, and inadequate number of viral copies(<138 copies/mL). A negative result must be combined with clinical observations, patient history, and epidemiological information. The expected result is Negative.  Fact Sheet for Patients:  BloggerCourse.com  Fact Sheet for Healthcare Providers:  SeriousBroker.it  This test is no t yet approved or cleared by the [place] FDA and  has been authorized for detection  and/or diagnosis of SARS-CoV-2 by FDA under an Emergency Use Authorization (EUA). This EUA will remain  in effect (meaning this test can be used) for the duration of the COVID-19 declaration under Section 564(b)(1) of the Act, 21 U.S.C.section 360bbb-3(b)(1), unless the authorization is terminated   or revoked sooner.       Influenza A by PCR NEGATIVE NEGATIVE Final   Influenza B by PCR NEGATIVE NEGATIVE Final    Comment: (NOTE) The Xpert Xpress SARS-CoV-2/FLU/RSV plus assay is intended as an aid in the diagnosis of influenza from Nasopharyngeal swab specimens and should not be used as a sole basis for treatment. Nasal washings and aspirates are unacceptable for Xpert Xpress SARS-CoV-2/FLU/RSV testing.  Fact Sheet for Patients: BloggerCourse.com  Fact Sheet for Healthcare Providers: SeriousBroker.it  This test is not yet approved or cleared by the [place] FDA and has been authorized for detection and/or diagnosis of SARS-CoV-2 by FDA under an Emergency Use Authorization (EUA). This EUA will remain in effect (meaning this test can be used) for the duration of the COVID-19 declaration under Section 564(b)(1) of the Act, 21 U.S.C. section 360bbb-3(b)(1), unless the authorization is terminated or revoked.  Performed at [facility], [address] [place], [place] [address]     Please note: You were cared for by a hospitalist during your hospital stay. Once you are discharged, your primary care physician will handle any further medical issues. Please note that NO REFILLS for any discharge medications will be authorized once you are discharged, as it is imperative that you return to your primary care physician (or establish a relationship with a primary care physician if you do not have one) for your post hospital discharge needs so that they can reassess your need for medications and monitor your lab values.    Time coordinating discharge: 40 minutes  SIGNED:   Burnadette Pop, MD  [facility] 08/28/2021, 11:08 AM Pager [phone]  If 7PM-7AM, please contact night-coverage [email or web address] Password [email or web address]

## 2021-08-28 NOTE — Plan of Care (Signed)

## 2021-08-31 LAB — CULTURE, BLOOD (ROUTINE X 2)
Culture: NO GROWTH
Culture: NO GROWTH
Special Requests: ADEQUATE
Special Requests: ADEQUATE

## 2022-08-08 ENCOUNTER — Emergency Department: Payer: No Typology Code available for payment source

## 2022-08-08 ENCOUNTER — Other Ambulatory Visit: Payer: Self-pay

## 2022-08-08 ENCOUNTER — Emergency Department
Admission: EM | Admit: 2022-08-08 | Discharge: 2022-08-08 | Disposition: A | Discharge status: Home / Self Care | Payer: No Typology Code available for payment source | Attending: Emergency Medicine | Admitting: Emergency Medicine

## 2022-08-08 DIAGNOSIS — R600 Localized edema: Secondary | ICD-10-CM | POA: Insufficient documentation

## 2022-08-08 DIAGNOSIS — R609 Edema, unspecified: Secondary | ICD-10-CM

## 2022-08-08 LAB — BASIC METABOLIC PANEL
Anion gap: 13 (ref 5–15)
BUN: 15 mg/dL (ref 6–20)
CO2: 28 mmol/L (ref 22–32)
Calcium: 9.3 mg/dL (ref 8.9–10.3)
Chloride: 91 mmol/L — ABNORMAL LOW (ref 98–111)
Creatinine, Ser: 1.07 mg/dL (ref 0.61–1.24)
GFR, Estimated: >60 mL/min (ref >60)
Glucose, Bld: 150 mg/dL — ABNORMAL HIGH (ref 70–99)
Potassium: 3.3 mmol/L — ABNORMAL LOW (ref 3.5–5.1)
Sodium: 132 mmol/L — ABNORMAL LOW (ref 135–145)

## 2022-08-08 LAB — CBC
HCT: 41.5 % (ref 39.0–52.0)
Hemoglobin: 13.1 g/dL (ref 13.0–17.0)
MCH: 24.1 pg — ABNORMAL LOW (ref 26.0–34.0)
MCHC: 31.6 g/dL (ref 30.0–36.0)
MCV: 76.4 fL — ABNORMAL LOW (ref 80.0–100.0)
Platelets: 415 10*3/uL — ABNORMAL HIGH (ref 150–400)
RBC: 5.43 MIL/uL (ref 4.22–5.81)
RDW: 17 % — ABNORMAL HIGH (ref 11.5–15.5)
WBC: 4.5 10*3/uL (ref 4.0–10.5)
nRBC: 0 % (ref 0.0–0.2)

## 2022-08-08 LAB — BRAIN NATRIURETIC PEPTIDE: B Natriuretic Peptide: 10.3 pg/mL (ref 0.0–100.0)

## 2022-08-08 MED ORDER — CEPHALEXIN 500 MG PO CAPS: 500.0000 mg | ORAL_CAPSULE | Freq: Two times a day (BID) | ORAL | 0 refills | Status: AC

## 2022-08-08 NOTE — Discharge Instructions (Addendum)
You were seen in the emergency department for swelling of your lower legs.  You did not have any findings concerning for a blood clot in your legs.  Your lab work was overall normal.  Concerned you may have a mild cellulitis over the lower legs.  You were started on an antibiotic.  It is importantly follow-up with your primary care physician.  Wear compression socks.  Elevate your legs above your heart when you are laying down.  Follow-up closely with your primary care physician.

## 2022-08-08 NOTE — ED Provider Notes (Signed)
[facility] Provider Note    Event Date/Time   First MD Initiated Contact with Patient 08/08/22 2004     (approximate)   History   Leg Swelling (Pt. To ED for bilat leg swelling. Pt. Was seen in ED for same last week and states he has been doing interventions with no alleviations of symptoms. Pt. Denies SOB, CP. Pt. Has hx of CHF.)   HPI  Charles Rangel is a 46 y.o. male with past medical history significant for CKD, CHF, presents to the emergency department with leg pain and swelling.  Patient endorses ongoing leg pain and swelling that has been worsening over the past 1 week.  States that 2 weeks ago he was having heart failure exacerbation and he increased the doses of his furosemide.  Got dehydrated.  He is now back on his normal doses of furosemide.  Denies any shortness of breath or chest pain.  Denies any cough.  No fever or chills.  Denies recent antibiotic use.  No falls or trauma.  Prior DVT 10 years ago that was provoked by a trip.  Not on anticoagulation.      Physical Exam   Triage Vital Signs: ED Triage Vitals  Enc Vitals Group     BP 08/08/22 1840 128/76     Pulse Rate 08/08/22 1840 (!) 105     Resp 08/08/22 1840 17     Temp 08/08/22 1840 98.5 F (36.9 C)     Temp Source 08/08/22 1840 Oral     SpO2 08/08/22 1840 99 %     Weight 08/08/22 1854 300 lb (136.1 kg)     Height 08/08/22 1854 5\' 6"  (1.676 m)     Head Circumference --      Peak Flow --      Pain Score 08/08/22 1854 9     Pain Loc --      Pain Edu? --      Excl. in [place]? --     Most recent vital signs: Vitals:   08/08/22 2236 08/08/22 2329  BP: 103/66 132/71  Pulse: 96 97  Resp: 19 18  Temp:  98 F (36.7 C)  SpO2: 96% 96%    Physical Exam Constitutional:      Appearance: He is well-developed.  HENT:     Head: Atraumatic.  Eyes:     Conjunctiva/sclera: Conjunctivae normal.  Cardiovascular:     Rate and Rhythm: Regular rhythm.  Pulmonary:     Effort: No  respiratory distress.  Musculoskeletal:        General: Swelling and tenderness present.     Cervical back: Normal range of motion.     Right lower leg: Edema present.     Left lower leg: Edema present.     Comments: Bilateral lower extremity edema that is worse in the right lower leg.  Overlying erythema and warmth that extends up to the knee.  No crepitus.  No induration.  Skin:    General: Skin is warm.  Neurological:     Mental Status: He is alert. Mental status is at baseline.          IMPRESSION / MDM / ASSESSMENT AND PLAN / ED COURSE  I reviewed the triage vital signs and the nursing notes.  Differential diagnosis including chronic venous stasis, cellulitis, DVT, heart failure exacerbation  Clinical picture is not consistent with a necrotizing soft tissue infection.     EKG   RADIOLOGY I independently reviewed imaging, my  interpretation of imaging: Ultrasound DVT without signs of a DVT.  Read as no acute findings.    ED Results / Procedures / Treatments   Labs (all labs ordered are listed, but only abnormal results are displayed) Labs interpreted as -   Lab work with creatinine at baseline.  Normal BNP.  No significant electrolyte abnormalities.   Labs Reviewed  CBC - Abnormal; Notable for the following components:      Result Value   MCV 76.4 (*)    MCH 24.1 (*)    RDW 17.0 (*)    Platelets 415 (*)    All other components within normal limits  BASIC METABOLIC PANEL - Abnormal; Notable for the following components:   Sodium 132 (*)    Potassium 3.3 (*)    Chloride 91 (*)    Glucose, Bld 150 (*)    All other components within normal limits  BRAIN NATRIURETIC PEPTIDE     Patient most likely with chronic venous stasis.  Possible mild overlying cellulitis to the right lower leg.  No concern for necrotizing soft tissue infection or underlying abscess.  Low suspicion for heart failure exacerbation given normal BNP and no shortness of breath.  Discussed  not doubling his furosemide given Signs of dehydration.  We will start the patient on Keflex.  Discussed compression socks and close follow-up with his primary care provider.  Given return precautions.  PROCEDURES:  Critical Care performed: No  Procedures  Patient's presentation is most consistent with acute presentation with potential threat to life or bodily function.   MEDICATIONS ORDERED IN ED: Medications - No data to display  FINAL CLINICAL IMPRESSION(S) / ED DIAGNOSES   Final diagnoses:  Peripheral edema     Rx / DC Orders   ED Discharge Orders          Ordered    cephALEXin (KEFLEX) 500 MG capsule  2 times daily        08/08/22 2309             Note:  This document was prepared using Dragon voice recognition software and may include unintentional dictation errors.   Corena Herter, MD 08/08/22 [phone]

## 2022-08-08 NOTE — ED Notes (Signed)
First Nurse Note: Pt to ED via [facility]. Pt has been having pain and swelling in his legs x 1 week. Pt has hx/o CHF, DVT. Pt is also having swelling and redness in both of his leg. Pt is currently in NAD.

## 2022-08-08 NOTE — ED Provider Triage Note (Signed)
Emergency Medicine Provider Triage Evaluation Note  Charles Rangel , a 46 y.o. male  was evaluated in triage.  Pt complains of swelling in lower extremities.  States is worsened over the past week.  Seen in the ED for this about a week ago.  Has been taking 2 torsemide per day.  No chest pain.  No shortness of breath.  Review of Systems  Positive:  Negative:   Physical Exam  BP 128/76 (BP Location: Right Arm)   Pulse (!) 105   Temp 98.5 F (36.9 C) (Oral)   Resp 17   SpO2 99%  Gen:   Awake, no distress   Resp:  Normal effort  MSK:   Moves extremities without difficulty  Other:    Medical Decision Making  Medically screening exam initiated at 6:52 PM.  Appropriate orders placed.  Charles Rangel was informed that the remainder of the evaluation will be completed by another provider, this initial triage assessment does not replace that evaluation, and the importance of remaining in the ED until their evaluation is complete.     Versie Starks, PA-C 08/08/22 [phone]

## 2022-08-08 NOTE — ED Triage Notes (Signed)
Pt. To ED for bilat leg swelling. Pt. Was seen in ED for same last week and states he has been doing interventions with no alleviations of symptoms. Pt. Denies SOB, CP. Pt. Has hx of CHF.

## 2023-02-16 ENCOUNTER — Emergency Department: Payer: No Typology Code available for payment source

## 2023-02-16 ENCOUNTER — Emergency Department
Admission: EM | Admit: 2023-02-16 | Discharge: 2023-02-16 | Disposition: A | Discharge status: Home / Self Care | Payer: No Typology Code available for payment source | Attending: Student in an Organized Health Care Education/Training Program | Admitting: Student in an Organized Health Care Education/Training Program

## 2023-02-16 ENCOUNTER — Other Ambulatory Visit: Payer: Self-pay

## 2023-02-16 DIAGNOSIS — Z1152 Encounter for screening for COVID-19: Secondary | ICD-10-CM | POA: Insufficient documentation

## 2023-02-16 DIAGNOSIS — J40 Bronchitis, not specified as acute or chronic: Secondary | ICD-10-CM

## 2023-02-16 DIAGNOSIS — I509 Heart failure, unspecified: Secondary | ICD-10-CM | POA: Insufficient documentation

## 2023-02-16 DIAGNOSIS — R0600 Dyspnea, unspecified: Secondary | ICD-10-CM

## 2023-02-16 DIAGNOSIS — R0602 Shortness of breath: Secondary | ICD-10-CM | POA: Diagnosis present

## 2023-02-16 LAB — BASIC METABOLIC PANEL
Anion gap: 13 (ref 5–15)
BUN: 17 mg/dL (ref 6–20)
CO2: 28 mmol/L (ref 22–32)
Calcium: 8.8 mg/dL — ABNORMAL LOW (ref 8.9–10.3)
Chloride: 91 mmol/L — ABNORMAL LOW (ref 98–111)
Creatinine, Ser: 1.41 mg/dL — ABNORMAL HIGH (ref 0.61–1.24)
GFR, Estimated: >60 mL/min (ref >60)
Glucose, Bld: 375 mg/dL — ABNORMAL HIGH (ref 70–99)
Potassium: 2.9 mmol/L — ABNORMAL LOW (ref 3.5–5.1)
Sodium: 132 mmol/L — ABNORMAL LOW (ref 135–145)

## 2023-02-16 LAB — CBC
HCT: 43.3 % (ref 39.0–52.0)
Hemoglobin: 14.1 g/dL (ref 13.0–17.0)
MCH: 25.2 pg — ABNORMAL LOW (ref 26.0–34.0)
MCHC: 32.6 g/dL (ref 30.0–36.0)
MCV: 77.3 fL — ABNORMAL LOW (ref 80.0–100.0)
Platelets: 152 10*3/uL (ref 150–400)
RBC: 5.6 MIL/uL (ref 4.22–5.81)
RDW: 15.4 % (ref 11.5–15.5)
WBC: 3.9 10*3/uL — ABNORMAL LOW (ref 4.0–10.5)
nRBC: 0 % (ref 0.0–0.2)

## 2023-02-16 LAB — RESP PANEL BY RT-PCR (RSV, FLU A&B, COVID)  RVPGX2
Influenza A by PCR: NEGATIVE
Influenza B by PCR: NEGATIVE
Resp Syncytial Virus by PCR: NEGATIVE
SARS Coronavirus 2 by RT PCR: NEGATIVE

## 2023-02-16 LAB — TROPONIN I (HIGH SENSITIVITY): Troponin I (High Sensitivity): 7 ng/L (ref <18)

## 2023-02-16 LAB — BRAIN NATRIURETIC PEPTIDE: B Natriuretic Peptide: 7.1 pg/mL (ref 0.0–100.0)

## 2023-02-16 MED ORDER — METOPROLOL SUCCINATE ER 50 MG PO TB24
100.0000 mg | ORAL_TABLET | Freq: Every day | ORAL | Status: DC
  Administered 2023-02-16: 100 mg via ORAL
  Filled 2023-02-16: qty 2

## 2023-02-16 MED ORDER — DOXYCYCLINE HYCLATE 100 MG PO TABS: 100.0000 mg | ORAL_TABLET | Freq: Two times a day (BID) | ORAL | 0 refills | Status: AC

## 2023-02-16 MED ORDER — DOXYCYCLINE HYCLATE 100 MG PO TABS
100.0000 mg | ORAL_TABLET | Freq: Once | ORAL | Status: AC
  Administered 2023-02-16: 100 mg via ORAL
  Filled 2023-02-16: qty 1

## 2023-02-16 MED ORDER — ALBUTEROL SULFATE (2.5 MG/3ML) 0.083% IN NEBU
2.5000 mg | INHALATION_SOLUTION | Freq: Once | RESPIRATORY_TRACT | Status: AC
  Administered 2023-02-16: 2.5 mg via RESPIRATORY_TRACT
  Filled 2023-02-16: qty 3

## 2023-02-16 MED ORDER — METOPROLOL SUCCINATE ER 100 MG PO TB24: 100.0000 mg | ORAL_TABLET | Freq: Every day | ORAL | 2 refills | Status: AC

## 2023-02-16 MED ORDER — IPRATROPIUM-ALBUTEROL 0.5-2.5 (3) MG/3ML IN SOLN
3.0000 mL | Freq: Once | RESPIRATORY_TRACT | Status: AC
  Administered 2023-02-16: 3 mL via RESPIRATORY_TRACT
  Filled 2023-02-16: qty 3

## 2023-02-16 MED ORDER — ALBUTEROL SULFATE HFA 108 (90 BASE) MCG/ACT IN AERS: 2.0000 | INHALATION_SPRAY | Freq: Four times a day (QID) | RESPIRATORY_TRACT | 2 refills | Status: AC | PRN

## 2023-02-16 MED ORDER — IOHEXOL 350 MG/ML SOLN
100.0000 mL | Freq: Once | INTRAVENOUS | Status: AC | PRN
  Administered 2023-02-16: 100 mL via INTRAVENOUS

## 2023-02-16 NOTE — ED Triage Notes (Addendum)
Pt comes with c/o sinus infection since last week. Pt states sob. Pt states he hasn't been tested for covid or flu. Pt states labored breathing and more winded when walking.   Pt states little pain in chest earlier while driving here. Pt states no fevers at home. Pt states congestion.

## 2023-02-16 NOTE — ED Provider Notes (Signed)
[facility] Provider Note    Event Date/Time   First MD Initiated Contact with Patient 02/16/23 1501     (approximate)   History   Shortness of Breath   HPI  Charles Rangel is a 47 y.o. male presenting to the ER for evaluation of worsening shortness of breath congestion over the past week.  Having upper nasal congestion no fevers or chills no cough.  Does have a history of CHF no history of asthma or bronchitis.  Does not feel like he is retaining fluid denies any orthopnea but is having significantly worsening exertional dyspnea.  Does have a history of PE x 2 not currently on anticoagulation.     Physical Exam   Triage Vital Signs: ED Triage Vitals  Enc Vitals Group     BP 02/16/23 1359 (!) 163/71     Pulse Rate 02/16/23 1359 (!) 135     Resp 02/16/23 1359 (!) 27     Temp 02/16/23 1359 98 F (36.7 C)     Temp src --      SpO2 02/16/23 1359 92 %     Weight --      Height --      Head Circumference --      Peak Flow --      Pain Score 02/16/23 1358 4     Pain Loc --      Pain Edu? --      Excl. in [place]? --     Most recent vital signs: Vitals:   02/16/23 2000 02/16/23 2030  BP: 131/62 (!) 147/77  Pulse: (!) 108 (!) 118  Resp: (!) 22   Temp:    SpO2: 93% 92%     Constitutional: Alert  Eyes: Conjunctivae are normal.  Head: Atraumatic. Nose: No congestion/rhinnorhea. Mouth/Throat: Mucous membranes are moist.   Neck: Painless ROM.  Cardiovascular:   Good peripheral circulation. Respiratory: Mildly tachypneic diminished breath sounds bilaterally. Gastrointestinal: Soft and nontender.  Musculoskeletal:  no deformity Neurologic:  MAE spontaneously. No gross focal neurologic deficits are appreciated.  Skin:  Skin is warm, dry and intact. No rash noted. Psychiatric: Mood and affect are normal. Speech and behavior are normal.    ED Results / Procedures / Treatments   Labs (all labs ordered are listed, but only abnormal results are  displayed) Labs Reviewed  BASIC METABOLIC PANEL - Abnormal; Notable for the following components:      Result Value   Sodium 132 (*)    Potassium 2.9 (*)    Chloride 91 (*)    Glucose, Bld 375 (*)    Creatinine, Ser 1.41 (*)    Calcium 8.8 (*)    All other components within normal limits  CBC - Abnormal; Notable for the following components:   WBC 3.9 (*)    MCV 77.3 (*)    MCH 25.2 (*)    All other components within normal limits  RESP PANEL BY RT-PCR (RSV, FLU A&B, COVID)  RVPGX2  BRAIN NATRIURETIC PEPTIDE  TROPONIN I (HIGH SENSITIVITY)  TROPONIN I (HIGH SENSITIVITY)     EKG  ED ECG REPORT I, Willy Eddy, the attending physician, personally viewed and interpreted this ECG.   Date: 02/16/2023  EKG Time: 14:02  Rate: 135  Rhythm: sinus  Axis: normal  Intervals: normal  ST&T Change: no stemi, no depressions    RADIOLOGY Please see ED Course for my review and interpretation.  I personally reviewed all radiographic images ordered to evaluate for  the above acute complaints and reviewed radiology reports and findings.  These findings were personally discussed with the patient.  Please see medical record for radiology report.    PROCEDURES:  Critical Care performed: No  Procedures   MEDICATIONS ORDERED IN ED: Medications  metoprolol succinate (TOPROL-XL) 24 hr tablet 100 mg (100 mg Oral Given 02/16/23 2001)  ipratropium-albuterol (DUONEB) 0.5-2.5 (3) MG/3ML nebulizer solution 3 mL (3 mLs Nebulization Given 02/16/23 1611)  iohexol (OMNIPAQUE) 350 MG/ML injection 100 mL (100 mLs Intravenous Contrast Given 02/16/23 1628)  doxycycline (VIBRA-TABS) tablet 100 mg (100 mg Oral Given 02/16/23 2001)  albuterol (PROVENTIL) (2.5 MG/3ML) 0.083% nebulizer solution 2.5 mg (2.5 mg Nebulization Given 02/16/23 2001)     IMPRESSION / MDM / ASSESSMENT AND PLAN / ED COURSE  I reviewed the triage vital signs and the nursing notes.                              Differential  diagnosis includes, but is not limited to, Asthma, copd, CHF, pna, ptx, malignancy, Pe, anemia  Patient presenting to the ER for evaluation of symptoms as described above.  Based on symptoms, risk factors and considered above differential, this presenting complaint could reflect a potentially life-threatening illness therefore the patient will be placed on continuous pulse oximetry and telemetry for monitoring.  Laboratory evaluation will be sent to evaluate for the above complaints.     Clinical Course as of 02/16/23 2322  Mon Feb 16, 2023  1859 Reassessed patient he feels significantly improved.  Query why his heart rate was still elevated despite CTA on my review interpretation not showing any evidence of PE and per radiology report without any PE.  States that he did not take his home metoprolol this morning because he is out.  BNP is normal.  Does have possible developing pneumonitis or consolidation will cover with antibiotics but he is able to ambulate without any hypoxia or significant dyspnea.  Discussed option for possible further observation here in the ER but overall his presentation is improving I think he would be appropriate for outpatient follow-up. [PR]  2040 Light for further observation here in the ER.  Patient's troponin is negative.  He is tolerating medication.  Feels comfortable with discharge home.  We discussed strict return precautions. [PR]    Clinical Course User Index [PR] Willy Eddy, MD     FINAL CLINICAL IMPRESSION(S) / ED DIAGNOSES   Final diagnoses:  Bronchitis  Dyspnea, unspecified type     Rx / DC Orders   ED Discharge Orders          Ordered    albuterol (VENTOLIN HFA) 108 (90 Base) MCG/ACT inhaler  Every 6 hours PRN        02/16/23 1904    metoprolol succinate (TOPROL-XL) 100 MG 24 hr tablet  Daily        02/16/23 1904    doxycycline (VIBRA-TABS) 100 MG tablet  2 times daily        02/16/23 1904             Note:  This document was  prepared using Dragon voice recognition software and may include unintentional dictation errors.    Willy Eddy, MD 02/16/23 2322

## 2023-02-16 NOTE — ED Notes (Signed)
Pt ambulatory independently and without difficulty. Pt did briefly drop to 91% on room air but came back up to 96-99% quickly. HR up to 120s. Dr Roxan Hockey notified.

## 2023-03-04 ENCOUNTER — Other Ambulatory Visit: Payer: Self-pay

## 2023-03-04 ENCOUNTER — Encounter: Payer: Self-pay | Admitting: Emergency Medicine

## 2023-03-04 ENCOUNTER — Emergency Department
Admission: EM | Admit: 2023-03-04 | Discharge: 2023-03-04 | Disposition: A | Discharge status: Home / Self Care | Payer: No Typology Code available for payment source | Attending: Emergency Medicine | Admitting: Emergency Medicine

## 2023-03-04 DIAGNOSIS — Z7984 Long term (current) use of oral hypoglycemic drugs: Secondary | ICD-10-CM | POA: Insufficient documentation

## 2023-03-04 DIAGNOSIS — E1165 Type 2 diabetes mellitus with hyperglycemia: Secondary | ICD-10-CM | POA: Insufficient documentation

## 2023-03-04 DIAGNOSIS — I509 Heart failure, unspecified: Secondary | ICD-10-CM | POA: Insufficient documentation

## 2023-03-04 DIAGNOSIS — R739 Hyperglycemia, unspecified: Secondary | ICD-10-CM | POA: Diagnosis present

## 2023-03-04 LAB — BASIC METABOLIC PANEL
Anion gap: 15 (ref 5–15)
BUN: 19 mg/dL (ref 6–20)
CO2: 29 mmol/L (ref 22–32)
Calcium: 10 mg/dL (ref 8.9–10.3)
Chloride: 90 mmol/L — ABNORMAL LOW (ref 98–111)
Creatinine, Ser: 1.33 mg/dL — ABNORMAL HIGH (ref 0.61–1.24)
GFR, Estimated: >60 mL/min (ref >60)
Glucose, Bld: 328 mg/dL — ABNORMAL HIGH (ref 70–99)
Potassium: 3.7 mmol/L (ref 3.5–5.1)
Sodium: 134 mmol/L — ABNORMAL LOW (ref 135–145)

## 2023-03-04 LAB — URINALYSIS, ROUTINE W REFLEX MICROSCOPIC
Bacteria, UA: NONE SEEN
Bilirubin Urine: NEGATIVE
Glucose, UA: >=500 mg/dL — AB
Ketones, ur: NEGATIVE mg/dL
Leukocytes,Ua: NEGATIVE
Nitrite: NEGATIVE
Protein, ur: NEGATIVE mg/dL
Specific Gravity, Urine: 1.01 (ref 1.005–1.030)
Squamous Epithelial / HPF: NONE SEEN /HPF (ref 0–5)
pH: 7 (ref 5.0–8.0)

## 2023-03-04 LAB — CBC
HCT: 47.1 % (ref 39.0–52.0)
Hemoglobin: 15.2 g/dL (ref 13.0–17.0)
MCH: 24.9 pg — ABNORMAL LOW (ref 26.0–34.0)
MCHC: 32.3 g/dL (ref 30.0–36.0)
MCV: 77.2 fL — ABNORMAL LOW (ref 80.0–100.0)
Platelets: 260 10*3/uL (ref 150–400)
RBC: 6.1 MIL/uL — ABNORMAL HIGH (ref 4.22–5.81)
RDW: 16.1 % — ABNORMAL HIGH (ref 11.5–15.5)
WBC: 6.6 10*3/uL (ref 4.0–10.5)
nRBC: 0 % (ref 0.0–0.2)

## 2023-03-04 LAB — CBG MONITORING, ED
Glucose-Capillary: 306 mg/dL — ABNORMAL HIGH (ref 70–99)
Glucose-Capillary: 138 mg/dL — ABNORMAL HIGH (ref 70–99)

## 2023-03-04 MED ORDER — SODIUM CHLORIDE 0.9 % IV BOLUS
1000.0000 mL | Freq: Once | INTRAVENOUS | Status: AC
  Administered 2023-03-04: 1000 mL via INTRAVENOUS

## 2023-03-04 MED ORDER — INSULIN ASPART 100 UNIT/ML IJ SOLN
15.0000 [IU] | Freq: Once | INTRAMUSCULAR | Status: AC
  Administered 2023-03-04: 15 [IU] via INTRAVENOUS
  Filled 2023-03-04: qty 1

## 2023-03-04 NOTE — ED Notes (Signed)
CBG 306 

## 2023-03-04 NOTE — ED Triage Notes (Signed)
Patient to ED via POV for hyperglycemia- 419 at work. Takes metformin. States he has been feeling tired and nauseous today.

## 2023-03-04 NOTE — ED Provider Notes (Signed)
[facility] Provider Note  Patient Contact: 4:27 PM (approximate)   History   Hyperglycemia   HPI  Charles Rangel is a 47 y.o. male who presents the emergency department for feeling tired, weak.  Patient states that roughly a week and a half ago he developed bronchitis, he was seen by his primary care provider noted to be hyperglycemic.  They talked that he may eventually end up on insulin but no changes were made to his metformin.  Patient states that today he was feeling very fatigued, checked his blood sugar and it was in the 400s.  Patient states that his typical blood sugar is between 130 and 200.  He has no altered mental status, emesis, diarrhea, chest pain, shortness of breath.  Patient is just concerned given his elevated glucose readings.  He has not been on steroids recently.  He does take metformin.  Patient is a congestive heart failure patient, takes torsemide.  He states that over the last week he has lost 5 pounds, states that he has no symptoms of congestive heart failure currently.     Physical Exam   Triage Vital Signs: ED Triage Vitals [03/04/23 1503]  Enc Vitals Group     BP (!) 141/76     Pulse Rate (!) 108     Resp 18     Temp 98.4 F (36.9 C)     Temp Source Oral     SpO2 93 %     Weight      Height      Head Circumference      Peak Flow      Pain Score 0     Pain Loc      Pain Edu?      Excl. in [place]?     Most recent vital signs: Vitals:   03/04/23 1801 03/04/23 1924  BP: (!) 127/108 (!) 100/52  Pulse: 92 90  Resp: 20 20  Temp:  98 F (36.7 C)  SpO2: 93% 94%     General: Alert and in no acute distress.   Cardiovascular:  Good peripheral perfusion Respiratory: Normal respiratory effort without tachypnea or retractions. Lungs CTAB. Good air entry to the bases with no decreased or absent breath sounds. Gastrointestinal: Bowel sounds 4 quadrants. Soft and nontender to palpation. No guarding or rigidity. No palpable  masses. No distention. Musculoskeletal: Full range of motion to all extremities.  Neurologic:  No gross focal neurologic deficits are appreciated.  Skin:   No rash noted Other:   ED Results / Procedures / Treatments   Labs (all labs ordered are listed, but only abnormal results are displayed) Labs Reviewed  BASIC METABOLIC PANEL - Abnormal; Notable for the following components:      Result Value   Sodium 134 (*)    Chloride 90 (*)    Glucose, Bld 328 (*)    Creatinine, Ser 1.33 (*)    All other components within normal limits  CBC - Abnormal; Notable for the following components:   RBC 6.10 (*)    MCV 77.2 (*)    MCH 24.9 (*)    RDW 16.1 (*)    All other components within normal limits  URINALYSIS, ROUTINE W REFLEX MICROSCOPIC - Abnormal; Notable for the following components:   Color, Urine STRAW (*)    APPearance CLEAR (*)    Glucose, UA >=500 (*)    Hgb urine dipstick SMALL (*)    All other components within normal limits  CBG MONITORING, ED - Abnormal; Notable for the following components:   Glucose-Capillary 306 (*)    All other components within normal limits  CBG MONITORING, ED - Abnormal; Notable for the following components:   Glucose-Capillary 138 (*)    All other components within normal limits     EKG     RADIOLOGY    No results found.  PROCEDURES:  Critical Care performed: No  Procedures   MEDICATIONS ORDERED IN ED: Medications  sodium chloride 0.9 % bolus 1,000 mL (0 mLs Intravenous Stopped 03/04/23 1900)  insulin aspart (novoLOG) injection 15 Units (15 Units Intravenous Given 03/04/23 1758)     IMPRESSION / MDM / ASSESSMENT AND PLAN / ED COURSE  I reviewed the triage vital signs and the nursing notes.                              Clinical Course as of 03/04/23 1957  Wed Mar 04, 2023  1733 Patient is a diabetic with a glucose of 328, was 400 earlier today.  Patient has been nauseated but has had no other concerning signs of DKA with  confusion, altered mental status, emesis, diarrhea, chest pain, shortness of breath, abdominal pain.  Patient has gap currently of 15, glucose of 328.  Will give the patient 1 L fluid even though he has a congestive heart failure patient.  He has been losing weight on his torsemide dose.  Will give fluid, 15 units of insulin and recheck the patient.  I will recheck a BMP instead of just a point-of-care glucose to ensure that his gap is not rising.  He was gap is rising, glucose is not improving will add diabetic labs to include osmolality, VBG and beta hydroxybutyrate.  However at this time have a low suspicion that patient is truly in DKA. [JC]    Clinical Course User Index [JC] Rechel Delosreyes, Delorise Royals, PA-C    Differential diagnosis includes, but is not limited to, hyperglycemia, DKA, hyperosmolar state  Patient's presentation is most consistent with acute presentation with potential threat to life or bodily function.   Patient's diagnosis is consistent with hyperglycemia.  Patient presents emergency department with elevated glucose readings.  Patient has elevated readings for the last 4 days.  He is otherwise relatively asymptomatic.  Patient has a history of type 2 diabetes, was only taken 500 of metformin every day.  This has been increased by his primary care provider.  Patient was hyperglycemic, borderline gap though it was not above 15.  Patient has great glycemic improvement after receiving 15 of insulin and 1 L of fluid.  Was not able to over hydrate the patient as he does have a history of CHF.  Patient has been losing weight over the last several days in regards to his water retention.  Felt that 1 L of fluid as he has no respiratory or peripheral edema symptoms would be appropriate.  Patient feels improved after insulin and fluids.  Will counsel the patient to check his glucose at home over the next several days.  He already has a follow-up appointment with his primary care to discuss his  glycemic control.  Patient is stable for discharge at this time.  Patient is given ED precautions to return to the ED for any worsening or new symptoms.     FINAL CLINICAL IMPRESSION(S) / ED DIAGNOSES   Final diagnoses:  Hyperglycemia     Rx / DC Orders  ED Discharge Orders     None        Note:  This document was prepared using Dragon voice recognition software and may include unintentional dictation errors.   Lanette Hampshire 03/04/23 Brooke Pace    Trinna Post, MD 03/07/23 [phone]

## 2023-03-04 NOTE — ED Notes (Signed)
C/o hyperglycemia. Reports feeling tired often with nausea
